# Patient Record
Sex: Male | Born: 1935 | Race: White | Hispanic: No | Marital: Married | Smoking: Never smoker
Health system: Southern US, Community
[De-identification: ages and names within clinical notes are randomized; demographics above are authoritative.]

## PROBLEM LIST (undated history)

## (undated) DIAGNOSIS — I452 Bifascicular block: Secondary | ICD-10-CM

## (undated) DIAGNOSIS — S329XXA Fracture of unspecified parts of lumbosacral spine and pelvis, initial encounter for closed fracture: Secondary | ICD-10-CM

## (undated) DIAGNOSIS — D1801 Hemangioma of skin and subcutaneous tissue: Secondary | ICD-10-CM

## (undated) DIAGNOSIS — D649 Anemia, unspecified: Secondary | ICD-10-CM

## (undated) DIAGNOSIS — C61 Malignant neoplasm of prostate: Secondary | ICD-10-CM

## (undated) DIAGNOSIS — I4891 Unspecified atrial fibrillation: Secondary | ICD-10-CM

## (undated) DIAGNOSIS — I781 Nevus, non-neoplastic: Secondary | ICD-10-CM

## (undated) DIAGNOSIS — I10 Essential (primary) hypertension: Secondary | ICD-10-CM

## (undated) DIAGNOSIS — Z9289 Personal history of other medical treatment: Secondary | ICD-10-CM

## (undated) DIAGNOSIS — C801 Malignant (primary) neoplasm, unspecified: Secondary | ICD-10-CM

## (undated) DIAGNOSIS — K219 Gastro-esophageal reflux disease without esophagitis: Secondary | ICD-10-CM

## (undated) DIAGNOSIS — R001 Bradycardia, unspecified: Secondary | ICD-10-CM

## (undated) DIAGNOSIS — I495 Sick sinus syndrome: Secondary | ICD-10-CM

## (undated) DIAGNOSIS — L659 Nonscarring hair loss, unspecified: Secondary | ICD-10-CM

## (undated) DIAGNOSIS — Q809 Congenital ichthyosis, unspecified: Secondary | ICD-10-CM

## (undated) DIAGNOSIS — H409 Unspecified glaucoma: Secondary | ICD-10-CM

## (undated) DIAGNOSIS — G459 Transient cerebral ischemic attack, unspecified: Secondary | ICD-10-CM

## (undated) HISTORY — PX: LITHOTRIPSY: SUR834

## (undated) HISTORY — DX: Malignant neoplasm of prostate: C61

## (undated) HISTORY — DX: Bifascicular block: I45.2

## (undated) HISTORY — DX: Nevus, non-neoplastic: I78.1

## (undated) HISTORY — DX: Sick sinus syndrome: I49.5

## (undated) HISTORY — DX: Anemia, unspecified: D64.9

## (undated) HISTORY — PX: APPENDECTOMY: SHX54

## (undated) HISTORY — DX: Essential (primary) hypertension: I10

## (undated) HISTORY — PX: CERVICAL SPINE SURGERY: SHX589

## (undated) HISTORY — DX: Bradycardia, unspecified: R00.1

## (undated) HISTORY — DX: Transient cerebral ischemic attack, unspecified: G45.9

## (undated) HISTORY — DX: Personal history of other medical treatment: Z92.89

## (undated) HISTORY — DX: Congenital ichthyosis, unspecified: Q80.9

## (undated) HISTORY — PX: PACEMAKER PLACEMENT: SHX43

## (undated) HISTORY — DX: Unspecified glaucoma: H40.9

## (undated) HISTORY — DX: Fracture of unspecified parts of lumbosacral spine and pelvis, initial encounter for closed fracture: S32.9XXA

## (undated) HISTORY — DX: Gastro-esophageal reflux disease without esophagitis: K21.9

## (undated) HISTORY — DX: Malignant (primary) neoplasm, unspecified: C80.1

## (undated) HISTORY — DX: Unspecified atrial fibrillation: I48.91

## (undated) HISTORY — PX: EXPLORATORY LAPAROTOMY: SUR591

## (undated) HISTORY — DX: Nonscarring hair loss, unspecified: L65.9

## (undated) HISTORY — DX: Hemangioma of skin and subcutaneous tissue: D18.01

---

## 2014-01-30 DIAGNOSIS — H4011X0 Primary open-angle glaucoma, stage unspecified: Secondary | ICD-10-CM | POA: Diagnosis not present

## 2014-02-02 DIAGNOSIS — R269 Unspecified abnormalities of gait and mobility: Secondary | ICD-10-CM | POA: Diagnosis not present

## 2014-02-10 DIAGNOSIS — R269 Unspecified abnormalities of gait and mobility: Secondary | ICD-10-CM | POA: Diagnosis not present

## 2014-02-10 DIAGNOSIS — M5137 Other intervertebral disc degeneration, lumbosacral region: Secondary | ICD-10-CM | POA: Diagnosis not present

## 2014-02-10 DIAGNOSIS — G959 Disease of spinal cord, unspecified: Secondary | ICD-10-CM | POA: Diagnosis not present

## 2014-02-10 DIAGNOSIS — M5126 Other intervertebral disc displacement, lumbar region: Secondary | ICD-10-CM | POA: Diagnosis not present

## 2014-02-10 DIAGNOSIS — M5030 Other cervical disc degeneration, unspecified cervical region: Secondary | ICD-10-CM | POA: Diagnosis not present

## 2014-02-10 DIAGNOSIS — Z981 Arthrodesis status: Secondary | ICD-10-CM | POA: Diagnosis not present

## 2014-02-10 DIAGNOSIS — M4000 Postural kyphosis, site unspecified: Secondary | ICD-10-CM | POA: Diagnosis not present

## 2014-02-10 DIAGNOSIS — M439 Deforming dorsopathy, unspecified: Secondary | ICD-10-CM | POA: Diagnosis not present

## 2014-02-24 DIAGNOSIS — I10 Essential (primary) hypertension: Secondary | ICD-10-CM | POA: Diagnosis not present

## 2014-02-24 DIAGNOSIS — R42 Dizziness and giddiness: Secondary | ICD-10-CM | POA: Diagnosis not present

## 2014-02-24 DIAGNOSIS — I4891 Unspecified atrial fibrillation: Secondary | ICD-10-CM | POA: Diagnosis not present

## 2014-02-24 DIAGNOSIS — I495 Sick sinus syndrome: Secondary | ICD-10-CM | POA: Diagnosis not present

## 2014-02-28 DIAGNOSIS — R209 Unspecified disturbances of skin sensation: Secondary | ICD-10-CM | POA: Diagnosis not present

## 2014-03-06 DIAGNOSIS — D649 Anemia, unspecified: Secondary | ICD-10-CM | POA: Diagnosis not present

## 2014-03-06 DIAGNOSIS — I10 Essential (primary) hypertension: Secondary | ICD-10-CM | POA: Diagnosis not present

## 2014-03-06 DIAGNOSIS — M1990 Unspecified osteoarthritis, unspecified site: Secondary | ICD-10-CM | POA: Diagnosis not present

## 2014-03-06 DIAGNOSIS — I4891 Unspecified atrial fibrillation: Secondary | ICD-10-CM | POA: Diagnosis not present

## 2014-03-14 DIAGNOSIS — Z950 Presence of cardiac pacemaker: Secondary | ICD-10-CM | POA: Diagnosis not present

## 2014-03-15 DIAGNOSIS — Z23 Encounter for immunization: Secondary | ICD-10-CM | POA: Diagnosis not present

## 2014-03-20 DIAGNOSIS — R262 Difficulty in walking, not elsewhere classified: Secondary | ICD-10-CM | POA: Diagnosis not present

## 2014-03-20 DIAGNOSIS — M6281 Muscle weakness (generalized): Secondary | ICD-10-CM | POA: Diagnosis not present

## 2014-03-21 DIAGNOSIS — R269 Unspecified abnormalities of gait and mobility: Secondary | ICD-10-CM | POA: Diagnosis not present

## 2014-03-21 DIAGNOSIS — M4802 Spinal stenosis, cervical region: Secondary | ICD-10-CM | POA: Diagnosis not present

## 2014-03-22 DIAGNOSIS — M6281 Muscle weakness (generalized): Secondary | ICD-10-CM | POA: Diagnosis not present

## 2014-03-22 DIAGNOSIS — R262 Difficulty in walking, not elsewhere classified: Secondary | ICD-10-CM | POA: Diagnosis not present

## 2014-03-27 DIAGNOSIS — M6281 Muscle weakness (generalized): Secondary | ICD-10-CM | POA: Diagnosis not present

## 2014-03-27 DIAGNOSIS — R262 Difficulty in walking, not elsewhere classified: Secondary | ICD-10-CM | POA: Diagnosis not present

## 2014-03-29 DIAGNOSIS — M6281 Muscle weakness (generalized): Secondary | ICD-10-CM | POA: Diagnosis not present

## 2014-03-29 DIAGNOSIS — R262 Difficulty in walking, not elsewhere classified: Secondary | ICD-10-CM | POA: Diagnosis not present

## 2014-03-30 DIAGNOSIS — S52599A Other fractures of lower end of unspecified radius, initial encounter for closed fracture: Secondary | ICD-10-CM | POA: Diagnosis not present

## 2014-04-05 DIAGNOSIS — S52599A Other fractures of lower end of unspecified radius, initial encounter for closed fracture: Secondary | ICD-10-CM | POA: Diagnosis not present

## 2014-04-19 DIAGNOSIS — S52599A Other fractures of lower end of unspecified radius, initial encounter for closed fracture: Secondary | ICD-10-CM | POA: Diagnosis not present

## 2014-04-26 DIAGNOSIS — M1710 Unilateral primary osteoarthritis, unspecified knee: Secondary | ICD-10-CM | POA: Diagnosis not present

## 2014-04-26 DIAGNOSIS — S52599A Other fractures of lower end of unspecified radius, initial encounter for closed fracture: Secondary | ICD-10-CM | POA: Diagnosis not present

## 2014-04-27 DIAGNOSIS — K5900 Constipation, unspecified: Secondary | ICD-10-CM | POA: Diagnosis not present

## 2014-04-28 DIAGNOSIS — I776 Arteritis, unspecified: Secondary | ICD-10-CM | POA: Diagnosis not present

## 2014-04-28 DIAGNOSIS — K8020 Calculus of gallbladder without cholecystitis without obstruction: Secondary | ICD-10-CM | POA: Diagnosis not present

## 2014-04-28 DIAGNOSIS — K5900 Constipation, unspecified: Secondary | ICD-10-CM | POA: Diagnosis not present

## 2014-05-02 DIAGNOSIS — C8299 Follicular lymphoma, unspecified, extranodal and solid organ sites: Secondary | ICD-10-CM | POA: Diagnosis not present

## 2014-05-02 DIAGNOSIS — K5900 Constipation, unspecified: Secondary | ICD-10-CM | POA: Diagnosis not present

## 2014-05-08 DIAGNOSIS — H4011X0 Primary open-angle glaucoma, stage unspecified: Secondary | ICD-10-CM | POA: Diagnosis not present

## 2014-05-08 DIAGNOSIS — H524 Presbyopia: Secondary | ICD-10-CM | POA: Diagnosis not present

## 2014-05-08 DIAGNOSIS — C8299 Follicular lymphoma, unspecified, extranodal and solid organ sites: Secondary | ICD-10-CM | POA: Diagnosis not present

## 2014-05-19 DIAGNOSIS — S52599A Other fractures of lower end of unspecified radius, initial encounter for closed fracture: Secondary | ICD-10-CM | POA: Diagnosis not present

## 2014-06-19 DIAGNOSIS — J069 Acute upper respiratory infection, unspecified: Secondary | ICD-10-CM | POA: Diagnosis not present

## 2014-06-23 DIAGNOSIS — S32810A Multiple fractures of pelvis with stable disruption of pelvic ring, initial encounter for closed fracture: Secondary | ICD-10-CM | POA: Diagnosis not present

## 2014-06-23 DIAGNOSIS — W19XXXA Unspecified fall, initial encounter: Secondary | ICD-10-CM | POA: Diagnosis not present

## 2014-06-23 DIAGNOSIS — M25559 Pain in unspecified hip: Secondary | ICD-10-CM | POA: Diagnosis not present

## 2014-06-23 DIAGNOSIS — S32509A Unspecified fracture of unspecified pubis, initial encounter for closed fracture: Secondary | ICD-10-CM | POA: Diagnosis not present

## 2014-06-24 DIAGNOSIS — I10 Essential (primary) hypertension: Secondary | ICD-10-CM | POA: Diagnosis present

## 2014-06-24 DIAGNOSIS — N400 Enlarged prostate without lower urinary tract symptoms: Secondary | ICD-10-CM | POA: Diagnosis not present

## 2014-06-24 DIAGNOSIS — Z950 Presence of cardiac pacemaker: Secondary | ICD-10-CM | POA: Diagnosis not present

## 2014-06-24 DIAGNOSIS — J810 Acute pulmonary edema: Secondary | ICD-10-CM | POA: Diagnosis not present

## 2014-06-24 DIAGNOSIS — K6389 Other specified diseases of intestine: Secondary | ICD-10-CM | POA: Diagnosis not present

## 2014-06-24 DIAGNOSIS — H47019 Ischemic optic neuropathy, unspecified eye: Secondary | ICD-10-CM | POA: Diagnosis not present

## 2014-06-24 DIAGNOSIS — J811 Chronic pulmonary edema: Secondary | ICD-10-CM | POA: Diagnosis not present

## 2014-06-24 DIAGNOSIS — Z01818 Encounter for other preprocedural examination: Secondary | ICD-10-CM | POA: Diagnosis not present

## 2014-06-24 DIAGNOSIS — W19XXXA Unspecified fall, initial encounter: Secondary | ICD-10-CM | POA: Diagnosis not present

## 2014-06-24 DIAGNOSIS — D649 Anemia, unspecified: Secondary | ICD-10-CM | POA: Diagnosis not present

## 2014-06-24 DIAGNOSIS — R143 Flatulence: Secondary | ICD-10-CM | POA: Diagnosis not present

## 2014-06-24 DIAGNOSIS — R141 Gas pain: Secondary | ICD-10-CM | POA: Diagnosis not present

## 2014-06-24 DIAGNOSIS — IMO0001 Reserved for inherently not codable concepts without codable children: Secondary | ICD-10-CM | POA: Diagnosis not present

## 2014-06-24 DIAGNOSIS — J449 Chronic obstructive pulmonary disease, unspecified: Secondary | ICD-10-CM | POA: Diagnosis present

## 2014-06-24 DIAGNOSIS — R0602 Shortness of breath: Secondary | ICD-10-CM | POA: Diagnosis not present

## 2014-06-24 DIAGNOSIS — I495 Sick sinus syndrome: Secondary | ICD-10-CM | POA: Diagnosis not present

## 2014-06-24 DIAGNOSIS — K5989 Other specified functional intestinal disorders: Secondary | ICD-10-CM | POA: Diagnosis not present

## 2014-06-24 DIAGNOSIS — Z4682 Encounter for fitting and adjustment of non-vascular catheter: Secondary | ICD-10-CM | POA: Diagnosis not present

## 2014-06-24 DIAGNOSIS — K560 Paralytic ileus: Secondary | ICD-10-CM | POA: Diagnosis not present

## 2014-06-24 DIAGNOSIS — E785 Hyperlipidemia, unspecified: Secondary | ICD-10-CM | POA: Diagnosis present

## 2014-06-24 DIAGNOSIS — R935 Abnormal findings on diagnostic imaging of other abdominal regions, including retroperitoneum: Secondary | ICD-10-CM | POA: Diagnosis not present

## 2014-06-24 DIAGNOSIS — K5900 Constipation, unspecified: Secondary | ICD-10-CM | POA: Diagnosis present

## 2014-06-24 DIAGNOSIS — J90 Pleural effusion, not elsewhere classified: Secondary | ICD-10-CM | POA: Diagnosis not present

## 2014-06-24 DIAGNOSIS — Z452 Encounter for adjustment and management of vascular access device: Secondary | ICD-10-CM | POA: Diagnosis not present

## 2014-06-24 DIAGNOSIS — R112 Nausea with vomiting, unspecified: Secondary | ICD-10-CM | POA: Diagnosis not present

## 2014-06-24 DIAGNOSIS — Z5189 Encounter for other specified aftercare: Secondary | ICD-10-CM | POA: Diagnosis not present

## 2014-06-24 DIAGNOSIS — I509 Heart failure, unspecified: Secondary | ICD-10-CM | POA: Diagnosis present

## 2014-06-24 DIAGNOSIS — K219 Gastro-esophageal reflux disease without esophagitis: Secondary | ICD-10-CM | POA: Diagnosis not present

## 2014-06-24 DIAGNOSIS — I2510 Atherosclerotic heart disease of native coronary artery without angina pectoris: Secondary | ICD-10-CM | POA: Diagnosis present

## 2014-06-24 DIAGNOSIS — R0902 Hypoxemia: Secondary | ICD-10-CM | POA: Diagnosis not present

## 2014-06-24 DIAGNOSIS — N179 Acute kidney failure, unspecified: Secondary | ICD-10-CM | POA: Diagnosis not present

## 2014-06-24 DIAGNOSIS — S3289XA Fracture of other parts of pelvis, initial encounter for closed fracture: Secondary | ICD-10-CM | POA: Diagnosis not present

## 2014-06-24 DIAGNOSIS — Z87898 Personal history of other specified conditions: Secondary | ICD-10-CM | POA: Diagnosis not present

## 2014-06-24 DIAGNOSIS — M1990 Unspecified osteoarthritis, unspecified site: Secondary | ICD-10-CM | POA: Diagnosis not present

## 2014-06-24 DIAGNOSIS — K5669 Other intestinal obstruction: Secondary | ICD-10-CM | POA: Diagnosis not present

## 2014-06-24 DIAGNOSIS — S32509A Unspecified fracture of unspecified pubis, initial encounter for closed fracture: Secondary | ICD-10-CM | POA: Diagnosis not present

## 2014-06-24 DIAGNOSIS — R109 Unspecified abdominal pain: Secondary | ICD-10-CM | POA: Diagnosis not present

## 2014-06-24 DIAGNOSIS — R52 Pain, unspecified: Secondary | ICD-10-CM | POA: Diagnosis not present

## 2014-06-24 DIAGNOSIS — S329XXA Fracture of unspecified parts of lumbosacral spine and pelvis, initial encounter for closed fracture: Secondary | ICD-10-CM | POA: Diagnosis not present

## 2014-07-04 DIAGNOSIS — R0989 Other specified symptoms and signs involving the circulatory and respiratory systems: Secondary | ICD-10-CM | POA: Diagnosis not present

## 2014-07-04 DIAGNOSIS — S32509A Unspecified fracture of unspecified pubis, initial encounter for closed fracture: Secondary | ICD-10-CM | POA: Diagnosis not present

## 2014-07-04 DIAGNOSIS — H47019 Ischemic optic neuropathy, unspecified eye: Secondary | ICD-10-CM | POA: Diagnosis present

## 2014-07-04 DIAGNOSIS — G609 Hereditary and idiopathic neuropathy, unspecified: Secondary | ICD-10-CM | POA: Diagnosis present

## 2014-07-04 DIAGNOSIS — Z713 Dietary counseling and surveillance: Secondary | ICD-10-CM | POA: Diagnosis not present

## 2014-07-04 DIAGNOSIS — I10 Essential (primary) hypertension: Secondary | ICD-10-CM | POA: Diagnosis present

## 2014-07-04 DIAGNOSIS — J90 Pleural effusion, not elsewhere classified: Secondary | ICD-10-CM | POA: Diagnosis not present

## 2014-07-04 DIAGNOSIS — Z9181 History of falling: Secondary | ICD-10-CM | POA: Diagnosis not present

## 2014-07-04 DIAGNOSIS — I495 Sick sinus syndrome: Secondary | ICD-10-CM | POA: Diagnosis present

## 2014-07-04 DIAGNOSIS — Z5189 Encounter for other specified aftercare: Secondary | ICD-10-CM | POA: Diagnosis not present

## 2014-07-04 DIAGNOSIS — Z87898 Personal history of other specified conditions: Secondary | ICD-10-CM | POA: Diagnosis not present

## 2014-07-04 DIAGNOSIS — M1990 Unspecified osteoarthritis, unspecified site: Secondary | ICD-10-CM | POA: Diagnosis present

## 2014-07-04 DIAGNOSIS — K5669 Other intestinal obstruction: Secondary | ICD-10-CM | POA: Diagnosis not present

## 2014-07-04 DIAGNOSIS — K219 Gastro-esophageal reflux disease without esophagitis: Secondary | ICD-10-CM | POA: Diagnosis present

## 2014-07-04 DIAGNOSIS — Z7982 Long term (current) use of aspirin: Secondary | ICD-10-CM | POA: Diagnosis not present

## 2014-07-04 DIAGNOSIS — IMO0001 Reserved for inherently not codable concepts without codable children: Secondary | ICD-10-CM | POA: Diagnosis not present

## 2014-07-04 DIAGNOSIS — N400 Enlarged prostate without lower urinary tract symptoms: Secondary | ICD-10-CM | POA: Diagnosis present

## 2014-07-04 DIAGNOSIS — Z950 Presence of cardiac pacemaker: Secondary | ICD-10-CM | POA: Diagnosis not present

## 2014-07-04 DIAGNOSIS — R52 Pain, unspecified: Secondary | ICD-10-CM | POA: Diagnosis not present

## 2014-07-04 DIAGNOSIS — H9190 Unspecified hearing loss, unspecified ear: Secondary | ICD-10-CM | POA: Diagnosis present

## 2014-07-04 DIAGNOSIS — IMO0002 Reserved for concepts with insufficient information to code with codable children: Secondary | ICD-10-CM | POA: Diagnosis present

## 2014-07-04 DIAGNOSIS — D649 Anemia, unspecified: Secondary | ICD-10-CM | POA: Diagnosis not present

## 2014-07-25 DIAGNOSIS — D649 Anemia, unspecified: Secondary | ICD-10-CM | POA: Diagnosis not present

## 2014-07-25 DIAGNOSIS — Z79899 Other long term (current) drug therapy: Secondary | ICD-10-CM | POA: Diagnosis not present

## 2014-07-27 DIAGNOSIS — R269 Unspecified abnormalities of gait and mobility: Secondary | ICD-10-CM | POA: Diagnosis not present

## 2014-07-27 DIAGNOSIS — IMO0001 Reserved for inherently not codable concepts without codable children: Secondary | ICD-10-CM | POA: Diagnosis not present

## 2014-07-31 DIAGNOSIS — R269 Unspecified abnormalities of gait and mobility: Secondary | ICD-10-CM | POA: Diagnosis not present

## 2014-07-31 DIAGNOSIS — Z1283 Encounter for screening for malignant neoplasm of skin: Secondary | ICD-10-CM | POA: Diagnosis not present

## 2014-07-31 DIAGNOSIS — L851 Acquired keratosis [keratoderma] palmaris et plantaris: Secondary | ICD-10-CM | POA: Diagnosis not present

## 2014-07-31 DIAGNOSIS — D692 Other nonthrombocytopenic purpura: Secondary | ICD-10-CM | POA: Diagnosis not present

## 2014-07-31 DIAGNOSIS — L570 Actinic keratosis: Secondary | ICD-10-CM | POA: Diagnosis not present

## 2014-07-31 DIAGNOSIS — IMO0001 Reserved for inherently not codable concepts without codable children: Secondary | ICD-10-CM | POA: Diagnosis not present

## 2014-08-02 DIAGNOSIS — K5989 Other specified functional intestinal disorders: Secondary | ICD-10-CM | POA: Diagnosis not present

## 2014-08-02 DIAGNOSIS — R269 Unspecified abnormalities of gait and mobility: Secondary | ICD-10-CM | POA: Diagnosis not present

## 2014-08-02 DIAGNOSIS — IMO0001 Reserved for inherently not codable concepts without codable children: Secondary | ICD-10-CM | POA: Diagnosis not present

## 2014-08-02 DIAGNOSIS — S32810A Multiple fractures of pelvis with stable disruption of pelvic ring, initial encounter for closed fracture: Secondary | ICD-10-CM | POA: Diagnosis not present

## 2014-08-02 DIAGNOSIS — R69 Illness, unspecified: Secondary | ICD-10-CM | POA: Diagnosis not present

## 2014-08-02 DIAGNOSIS — IMO0002 Reserved for concepts with insufficient information to code with codable children: Secondary | ICD-10-CM | POA: Diagnosis not present

## 2014-08-07 DIAGNOSIS — R269 Unspecified abnormalities of gait and mobility: Secondary | ICD-10-CM | POA: Diagnosis not present

## 2014-08-07 DIAGNOSIS — IMO0001 Reserved for inherently not codable concepts without codable children: Secondary | ICD-10-CM | POA: Diagnosis not present

## 2014-08-11 DIAGNOSIS — IMO0001 Reserved for inherently not codable concepts without codable children: Secondary | ICD-10-CM | POA: Diagnosis not present

## 2014-08-11 DIAGNOSIS — R269 Unspecified abnormalities of gait and mobility: Secondary | ICD-10-CM | POA: Diagnosis not present

## 2014-08-14 DIAGNOSIS — R269 Unspecified abnormalities of gait and mobility: Secondary | ICD-10-CM | POA: Diagnosis not present

## 2014-08-14 DIAGNOSIS — IMO0001 Reserved for inherently not codable concepts without codable children: Secondary | ICD-10-CM | POA: Diagnosis not present

## 2014-08-16 DIAGNOSIS — R269 Unspecified abnormalities of gait and mobility: Secondary | ICD-10-CM | POA: Diagnosis not present

## 2014-08-16 DIAGNOSIS — IMO0001 Reserved for inherently not codable concepts without codable children: Secondary | ICD-10-CM | POA: Diagnosis not present

## 2014-08-18 DIAGNOSIS — K5900 Constipation, unspecified: Secondary | ICD-10-CM | POA: Diagnosis not present

## 2014-08-18 DIAGNOSIS — Z9181 History of falling: Secondary | ICD-10-CM | POA: Diagnosis not present

## 2014-08-18 DIAGNOSIS — D649 Anemia, unspecified: Secondary | ICD-10-CM | POA: Diagnosis not present

## 2014-08-18 DIAGNOSIS — S32509A Unspecified fracture of unspecified pubis, initial encounter for closed fracture: Secondary | ICD-10-CM | POA: Diagnosis not present

## 2014-08-21 DIAGNOSIS — IMO0001 Reserved for inherently not codable concepts without codable children: Secondary | ICD-10-CM | POA: Diagnosis not present

## 2014-08-21 DIAGNOSIS — R269 Unspecified abnormalities of gait and mobility: Secondary | ICD-10-CM | POA: Diagnosis not present

## 2014-08-23 DIAGNOSIS — H4011X0 Primary open-angle glaucoma, stage unspecified: Secondary | ICD-10-CM | POA: Diagnosis not present

## 2014-08-28 DIAGNOSIS — R269 Unspecified abnormalities of gait and mobility: Secondary | ICD-10-CM | POA: Diagnosis not present

## 2014-08-28 DIAGNOSIS — IMO0001 Reserved for inherently not codable concepts without codable children: Secondary | ICD-10-CM | POA: Diagnosis not present

## 2014-08-31 DIAGNOSIS — R269 Unspecified abnormalities of gait and mobility: Secondary | ICD-10-CM | POA: Diagnosis not present

## 2014-08-31 DIAGNOSIS — IMO0001 Reserved for inherently not codable concepts without codable children: Secondary | ICD-10-CM | POA: Diagnosis not present

## 2014-09-05 DIAGNOSIS — R269 Unspecified abnormalities of gait and mobility: Secondary | ICD-10-CM | POA: Diagnosis not present

## 2014-09-05 DIAGNOSIS — IMO0001 Reserved for inherently not codable concepts without codable children: Secondary | ICD-10-CM | POA: Diagnosis not present

## 2014-09-07 DIAGNOSIS — R269 Unspecified abnormalities of gait and mobility: Secondary | ICD-10-CM | POA: Diagnosis not present

## 2014-09-07 DIAGNOSIS — IMO0001 Reserved for inherently not codable concepts without codable children: Secondary | ICD-10-CM | POA: Diagnosis not present

## 2014-09-08 DIAGNOSIS — Z79899 Other long term (current) drug therapy: Secondary | ICD-10-CM | POA: Diagnosis not present

## 2014-09-08 DIAGNOSIS — I4891 Unspecified atrial fibrillation: Secondary | ICD-10-CM | POA: Diagnosis not present

## 2014-09-08 DIAGNOSIS — I495 Sick sinus syndrome: Secondary | ICD-10-CM | POA: Diagnosis not present

## 2014-09-08 DIAGNOSIS — R059 Cough, unspecified: Secondary | ICD-10-CM | POA: Diagnosis not present

## 2014-09-08 DIAGNOSIS — R05 Cough: Secondary | ICD-10-CM | POA: Diagnosis not present

## 2014-09-11 DIAGNOSIS — IMO0001 Reserved for inherently not codable concepts without codable children: Secondary | ICD-10-CM | POA: Diagnosis not present

## 2014-09-11 DIAGNOSIS — R269 Unspecified abnormalities of gait and mobility: Secondary | ICD-10-CM | POA: Diagnosis not present

## 2014-09-12 DIAGNOSIS — Z79899 Other long term (current) drug therapy: Secondary | ICD-10-CM | POA: Diagnosis not present

## 2014-09-15 DIAGNOSIS — IMO0001 Reserved for inherently not codable concepts without codable children: Secondary | ICD-10-CM | POA: Diagnosis not present

## 2014-09-15 DIAGNOSIS — R269 Unspecified abnormalities of gait and mobility: Secondary | ICD-10-CM | POA: Diagnosis not present

## 2014-09-18 DIAGNOSIS — R269 Unspecified abnormalities of gait and mobility: Secondary | ICD-10-CM | POA: Diagnosis not present

## 2014-09-18 DIAGNOSIS — IMO0001 Reserved for inherently not codable concepts without codable children: Secondary | ICD-10-CM | POA: Diagnosis not present

## 2014-09-20 DIAGNOSIS — R269 Unspecified abnormalities of gait and mobility: Secondary | ICD-10-CM | POA: Diagnosis not present

## 2014-09-20 DIAGNOSIS — IMO0001 Reserved for inherently not codable concepts without codable children: Secondary | ICD-10-CM | POA: Diagnosis not present

## 2014-09-20 DIAGNOSIS — Z23 Encounter for immunization: Secondary | ICD-10-CM | POA: Diagnosis not present

## 2014-09-26 DIAGNOSIS — Z950 Presence of cardiac pacemaker: Secondary | ICD-10-CM | POA: Diagnosis not present

## 2014-09-28 DIAGNOSIS — I495 Sick sinus syndrome: Secondary | ICD-10-CM | POA: Diagnosis not present

## 2014-09-28 DIAGNOSIS — I4891 Unspecified atrial fibrillation: Secondary | ICD-10-CM | POA: Diagnosis not present

## 2014-09-28 DIAGNOSIS — Z79899 Other long term (current) drug therapy: Secondary | ICD-10-CM | POA: Diagnosis not present

## 2014-09-28 DIAGNOSIS — R05 Cough: Secondary | ICD-10-CM | POA: Diagnosis not present

## 2014-10-02 DIAGNOSIS — M810 Age-related osteoporosis without current pathological fracture: Secondary | ICD-10-CM | POA: Diagnosis not present

## 2014-10-02 DIAGNOSIS — I480 Paroxysmal atrial fibrillation: Secondary | ICD-10-CM | POA: Diagnosis not present

## 2014-10-02 DIAGNOSIS — Z79899 Other long term (current) drug therapy: Secondary | ICD-10-CM | POA: Diagnosis not present

## 2014-10-16 DIAGNOSIS — R05 Cough: Secondary | ICD-10-CM | POA: Diagnosis not present

## 2014-10-23 DIAGNOSIS — M810 Age-related osteoporosis without current pathological fracture: Secondary | ICD-10-CM | POA: Diagnosis not present

## 2014-11-21 DIAGNOSIS — H4011X2 Primary open-angle glaucoma, moderate stage: Secondary | ICD-10-CM | POA: Diagnosis not present

## 2014-11-30 DIAGNOSIS — M25562 Pain in left knee: Secondary | ICD-10-CM | POA: Diagnosis not present

## 2014-11-30 DIAGNOSIS — R05 Cough: Secondary | ICD-10-CM | POA: Diagnosis not present

## 2014-11-30 DIAGNOSIS — M1990 Unspecified osteoarthritis, unspecified site: Secondary | ICD-10-CM | POA: Diagnosis not present

## 2014-12-07 DIAGNOSIS — L821 Other seborrheic keratosis: Secondary | ICD-10-CM | POA: Diagnosis not present

## 2014-12-07 DIAGNOSIS — D485 Neoplasm of uncertain behavior of skin: Secondary | ICD-10-CM | POA: Diagnosis not present

## 2014-12-07 DIAGNOSIS — Z85828 Personal history of other malignant neoplasm of skin: Secondary | ICD-10-CM | POA: Diagnosis not present

## 2014-12-07 DIAGNOSIS — L723 Sebaceous cyst: Secondary | ICD-10-CM | POA: Diagnosis not present

## 2014-12-08 DIAGNOSIS — R300 Dysuria: Secondary | ICD-10-CM | POA: Diagnosis not present

## 2014-12-08 DIAGNOSIS — R8290 Unspecified abnormal findings in urine: Secondary | ICD-10-CM | POA: Diagnosis not present

## 2015-01-29 DIAGNOSIS — Z79899 Other long term (current) drug therapy: Secondary | ICD-10-CM | POA: Diagnosis not present

## 2015-01-29 DIAGNOSIS — M810 Age-related osteoporosis without current pathological fracture: Secondary | ICD-10-CM | POA: Diagnosis not present

## 2015-01-29 DIAGNOSIS — D649 Anemia, unspecified: Secondary | ICD-10-CM | POA: Diagnosis not present

## 2015-01-29 DIAGNOSIS — M549 Dorsalgia, unspecified: Secondary | ICD-10-CM | POA: Diagnosis not present

## 2015-01-29 DIAGNOSIS — R21 Rash and other nonspecific skin eruption: Secondary | ICD-10-CM | POA: Diagnosis not present

## 2015-02-16 DIAGNOSIS — H0289 Other specified disorders of eyelid: Secondary | ICD-10-CM | POA: Diagnosis not present

## 2015-02-23 DIAGNOSIS — R300 Dysuria: Secondary | ICD-10-CM | POA: Diagnosis not present

## 2015-02-23 DIAGNOSIS — R8290 Unspecified abnormal findings in urine: Secondary | ICD-10-CM | POA: Diagnosis not present

## 2015-02-27 DIAGNOSIS — C8590 Non-Hodgkin lymphoma, unspecified, unspecified site: Secondary | ICD-10-CM | POA: Diagnosis not present

## 2015-02-27 DIAGNOSIS — Z950 Presence of cardiac pacemaker: Secondary | ICD-10-CM | POA: Diagnosis not present

## 2015-02-27 DIAGNOSIS — H409 Unspecified glaucoma: Secondary | ICD-10-CM | POA: Diagnosis not present

## 2015-02-27 DIAGNOSIS — S72009A Fracture of unspecified part of neck of unspecified femur, initial encounter for closed fracture: Secondary | ICD-10-CM | POA: Diagnosis not present

## 2015-03-01 ENCOUNTER — Emergency Department (HOSPITAL_COMMUNITY): Payer: Medicare Other

## 2015-03-01 ENCOUNTER — Emergency Department (HOSPITAL_COMMUNITY)
Admission: EM | Admit: 2015-03-01 | Discharge: 2015-03-01 | Disposition: A | Discharge status: Home / Self Care | Payer: Medicare Other | Attending: Emergency Medicine | Admitting: Emergency Medicine

## 2015-03-01 DIAGNOSIS — Z8781 Personal history of (healed) traumatic fracture: Secondary | ICD-10-CM | POA: Diagnosis not present

## 2015-03-01 DIAGNOSIS — Z87828 Personal history of other (healed) physical injury and trauma: Secondary | ICD-10-CM | POA: Diagnosis not present

## 2015-03-01 DIAGNOSIS — M1611 Unilateral primary osteoarthritis, right hip: Secondary | ICD-10-CM | POA: Diagnosis not present

## 2015-03-01 DIAGNOSIS — M25551 Pain in right hip: Secondary | ICD-10-CM

## 2015-03-01 MED ORDER — MORPHINE SULFATE 4 MG/ML IJ SOLN
4.0000 mg | Freq: Once | INTRAMUSCULAR | Status: AC
  Administered 2015-03-01: 4 mg via INTRAMUSCULAR
  Filled 2015-03-01: qty 1

## 2015-03-01 MED ORDER — OXYCODONE-ACETAMINOPHEN 5-325 MG PO TABS: 1.0000 | ORAL_TABLET | Freq: Four times a day (QID) | ORAL | Status: DC | PRN

## 2015-03-01 MED ORDER — DOCUSATE SODIUM 100 MG PO CAPS: 100.0000 mg | ORAL_CAPSULE | Freq: Two times a day (BID) | ORAL | Status: DC

## 2015-03-01 MED ORDER — HYDROCODONE-ACETAMINOPHEN 5-325 MG PO TABS
2.0000 | ORAL_TABLET | Freq: Once | ORAL | Status: AC
  Administered 2015-03-01: 2 via ORAL
  Filled 2015-03-01: qty 2

## 2015-03-01 NOTE — ED Notes (Signed)
Per EMS 3 days prior began having rt hip pain. Denies any recent falls/injuries. Hx of broken bones in pelvis in June 2015. EMS states no deformities, able to walk with cane.   Pt took 1000mg  Tylenol around 0900 this a.m.

## 2015-03-01 NOTE — ED Notes (Signed)
Bed: WA07 Expected date:  Expected time:  Means of arrival:  Comments: Ems- 81 yo M, right hip pain

## 2015-03-01 NOTE — Discharge Instructions (Signed)
Hip Pain Your hip is the joint between your upper legs and your lower pelvis. The bones, cartilage, tendons, and muscles of your hip joint perform a lot of work each day supporting your body weight and allowing you to move around. Hip pain can range from a minor ache to severe pain in one or both of your hips. Pain may be felt on the inside of the hip joint near the groin, or the outside near the buttocks and upper thigh. You may have swelling or stiffness as well.  HOME CARE INSTRUCTIONS   Take medicines only as directed by your health care provider.  Apply ice to the injured area:  Put ice in a plastic bag.  Place a towel between your skin and the bag.  Leave the ice on for 15-20 minutes at a time, 3-4 times a day.  Keep your leg raised (elevated) when possible to lessen swelling.  Avoid activities that cause pain.  Follow specific exercises as directed by your health care provider.  Sleep with a pillow between your legs on your most comfortable side.  Record how often you have hip pain, the location of the pain, and what it feels like. SEEK MEDICAL CARE IF:   You are unable to put weight on your leg.  Your hip is red or swollen or very tender to touch.  Your pain or swelling continues or worsens after 1 week.  You have increasing difficulty walking.  You have a fever. SEEK IMMEDIATE MEDICAL CARE IF:   You have fallen.  You have a sudden increase in pain and swelling in your hip. MAKE SURE YOU:   Understand these instructions.  Will watch your condition.  Will get help right away if you are not doing well or get worse. Document Released: 06/04/2010 Document Revised: 05/01/2014 Document Reviewed: 08/11/2013 [facility] Patient Information 2015 [place], [place]. This information is not intended to replace advice given to you by your health care provider. Make sure you discuss any questions you have with your health care provider.   Arthritis, Nonspecific Arthritis is  inflammation of a joint. This usually means pain, redness, warmth or swelling are present. One or more joints may be involved. There are a number of types of arthritis. Your caregiver may not be able to tell what type of arthritis you have right away. CAUSES  The most common cause of arthritis is the wear and tear on the joint (osteoarthritis). This causes damage to the cartilage, which can break down over time. The knees, hips, back and neck are most often affected by this type of arthritis. Other types of arthritis and common causes of joint pain include:  Sprains and other injuries near the joint. Sometimes minor sprains and injuries cause pain and swelling that develop hours later.  Rheumatoid arthritis. This affects hands, feet and knees. It usually affects both sides of your body at the same time. It is often associated with chronic ailments, fever, weight loss and general weakness.  Crystal arthritis. Gout and pseudo gout can cause occasional acute severe pain, redness and swelling in the foot, ankle, or knee.  Infectious arthritis. Bacteria can get into a joint through a break in overlying skin. This can cause infection of the joint. Bacteria and viruses can also spread through the blood and affect your joints.  Drug, infectious and allergy reactions. Sometimes joints can become mildly painful and slightly swollen with these types of illnesses. SYMPTOMS   Pain is the main symptom.  Your joint or  joints can also be red, swollen and warm or hot to the touch.  You may have a fever with certain types of arthritis, or even feel overall ill.  The joint with arthritis will hurt with movement. Stiffness is present with some types of arthritis. DIAGNOSIS  Your caregiver will suspect arthritis based on your description of your symptoms and on your exam. Testing may be needed to find the type of arthritis:  Blood and sometimes urine tests.  X-ray tests and sometimes CT or MRI  scans.  Removal of fluid from the joint (arthrocentesis) is done to check for bacteria, crystals or other causes. Your caregiver (or a specialist) will numb the area over the joint with a local anesthetic, and use a needle to remove joint fluid for examination. This procedure is only minimally uncomfortable.  Even with these tests, your caregiver may not be able to tell what kind of arthritis you have. Consultation with a specialist (rheumatologist) may be helpful. TREATMENT  Your caregiver will discuss with you treatment specific to your type of arthritis. If the specific type cannot be determined, then the following general recommendations may apply. Treatment of severe joint pain includes:  Rest.  Elevation.  Anti-inflammatory medication (for example, ibuprofen) may be prescribed. Avoiding activities that cause increased pain.  Only take over-the-counter or prescription medicines for pain and discomfort as recommended by your caregiver.  Cold packs over an inflamed joint may be used for 10 to 15 minutes every hour. Hot packs sometimes feel better, but do not use overnight. Do not use hot packs if you are diabetic without your caregiver's permission.  A cortisone shot into arthritic joints may help reduce pain and swelling.  Any acute arthritis that gets worse over the next 1 to 2 days needs to be looked at to be sure there is no joint infection. Long-term arthritis treatment involves modifying activities and lifestyle to reduce joint stress jarring. This can include weight loss. Also, exercise is needed to nourish the joint cartilage and remove waste. This helps keep the muscles around the joint strong. HOME CARE INSTRUCTIONS   Do not take aspirin to relieve pain if gout is suspected. This elevates uric acid levels.  Only take over-the-counter or prescription medicines for pain, discomfort or fever as directed by your caregiver.  Rest the joint as much as possible.  If your joint is  swollen, keep it elevated.  Use crutches if the painful joint is in your leg.  Drinking plenty of fluids may help for certain types of arthritis.  Follow your caregiver's dietary instructions.  Try low-impact exercise such as:  Swimming.  Water aerobics.  Biking.  Walking.  Morning stiffness is often relieved by a warm shower.  Put your joints through regular range-of-motion. SEEK MEDICAL CARE IF:   You do not feel better in 24 hours or are getting worse.  You have side effects to medications, or are not getting better with treatment. SEEK IMMEDIATE MEDICAL CARE IF:   You have a fever.  You develop severe joint pain, swelling or redness.  Many joints are involved and become painful and swollen.  There is severe back pain and/or leg weakness.  You have loss of bowel or bladder control. Document Released: 01/22/2005 Document Revised: 03/08/2012 Document Reviewed: 02/07/2009 [facility] Patient Information 2015 [place], [place]. This information is not intended to replace advice given to you by your health care provider. Make sure you discuss any questions you have with your health care provider.

## 2015-03-01 NOTE — ED Provider Notes (Signed)
TIME SEEN: 12:30 PM  CHIEF COMPLAINT: Right hip pain  HPI: Pt is a 81 y.o. male with prior history of pelvic fractures who presents to the emergency department with complaints of right hip pain over the past several days. States he fell in June 2015 and had multiple hip fractures. States that he has not had any new injury but his pain has come back in the right hip and is worse with movement. He took Tylenol this morning with some relief. No numbness, tingling or focal weakness. No joint swelling. No fever. No redness or warmth. No numbness, tingling or focal weakness.  ROS: See HPI Constitutional: no fever  Eyes: no drainage  ENT: no runny nose   Cardiovascular:  no chest pain  Resp: no SOB  GI: no vomiting GU: no dysuria Integumentary: no rash  Allergy: no hives  Musculoskeletal: no leg swelling  Neurological: no slurred speech ROS otherwise negative  PAST MEDICAL HISTORY/PAST SURGICAL HISTORY:  No past medical history on file.  MEDICATIONS:  Prior to Admission medications   Not on File    ALLERGIES:  Allergies not on file  SOCIAL HISTORY:  History  Substance Use Topics  . Smoking status: Not on file  . Smokeless tobacco: Not on file  . Alcohol Use: Not on file    FAMILY HISTORY: No family history on file.  EXAM: BP 136/60 mmHg  Pulse 70  Temp(Src) 97.8 F (36.6 C) (Oral)  Resp 18  SpO2 98% CONSTITUTIONAL: Alert and oriented and responds appropriately to questions. Well-appearing; well-nourished, elderly, in no distress HEAD: Normocephalic EYES: Conjunctivae clear, PERRL ENT: normal nose; no rhinorrhea; moist mucous membranes; pharynx without lesions noted NECK: Supple, no meningismus, no LAD  CARD: RRR; S1 and S2 appreciated; no murmurs, no clicks, no rubs, no gallops RESP: Normal chest excursion without splinting or tachypnea; breath sounds clear and equal bilaterally; no wheezes, no rhonchi, no rales,  ABD/GI: Normal bowel sounds; non-distended; soft,  non-tender, no rebound, no guarding BACK:  The back appears normal and is non-tender to palpation, there is no CVA tenderness EXT: Tender to palpation of the right hip without erythema or warmth, no joint effusion, no leg length discrepancy, 2+ DP pulses bilaterally, no calf tenderness or swelling, pain with flexion of the right hip, otherwise Normal ROM in all joints; otherwise Ixodes are non-tender to palpation; no edema; normal capillary refill; no cyanosis; compartments soft    SKIN: Normal color for age and race; warm NEURO: Moves all extremities equally, sensation to light touch intact diffusely PSYCH: The patient's mood and manner are appropriate. Grooming and personal hygiene are appropriate.  MEDICAL DECISION MAKING: Patient here with right hip pain. Suspect exacerbation of his chronic injury. No history of new injury, fall. No sign of septic arthritis. Neurovascular intact distally. Will obtain x-ray of the right hip. He has been able to ambulate with a cane for the past several days. We'll give Vicodin for pain.  ED PROGRESS: X-ray show no acute fracture or subluxation. Do not feel he needs a CT scan at this time. He has been able to ambulate with a cane. Suspect this is just an exacerbation of his chronic pain from his prior hip fractures. Wife does report they recently moved and he now has 6 steps that he has to walk up daily. She thinks this may be contributing. Have advised him to rest, elevate, apply ice. We'll discharge with Percocet to use as needed. Have advised him to follow up with his primary  care physician if symptoms continue. Discussed return precautions. Patient verbalizes understanding and is comfortable with plan.     [place], DO 03/01/15 1334

## 2015-03-02 DIAGNOSIS — H04121 Dry eye syndrome of right lacrimal gland: Secondary | ICD-10-CM | POA: Diagnosis not present

## 2015-03-08 DIAGNOSIS — M25551 Pain in right hip: Secondary | ICD-10-CM | POA: Diagnosis not present

## 2015-03-12 DIAGNOSIS — M25551 Pain in right hip: Secondary | ICD-10-CM | POA: Diagnosis not present

## 2015-03-14 ENCOUNTER — Telehealth: Payer: Self-pay | Admitting: Hematology

## 2015-03-14 NOTE — Telephone Encounter (Signed)
CALLED, SCHEDULED, AND CONFIRMED APPT WITH PATIENT.  DR. Burr Medico 04/09/15 2:30 PM  DX: LYMPHOMA IN REMISSION REFERRING: [name]

## 2015-03-16 DIAGNOSIS — H04123 Dry eye syndrome of bilateral lacrimal glands: Secondary | ICD-10-CM | POA: Diagnosis not present

## 2015-03-20 DIAGNOSIS — M25551 Pain in right hip: Secondary | ICD-10-CM | POA: Diagnosis not present

## 2015-03-30 ENCOUNTER — Other Ambulatory Visit: Payer: Self-pay | Admitting: Medical Oncology

## 2015-03-30 DIAGNOSIS — Z8572 Personal history of non-Hodgkin lymphomas: Secondary | ICD-10-CM

## 2015-04-02 ENCOUNTER — Ambulatory Visit (HOSPITAL_BASED_OUTPATIENT_CLINIC_OR_DEPARTMENT_OTHER): Payer: Medicare Other | Admitting: Internal Medicine

## 2015-04-02 ENCOUNTER — Ambulatory Visit: Payer: Medicare Other

## 2015-04-02 ENCOUNTER — Other Ambulatory Visit (HOSPITAL_BASED_OUTPATIENT_CLINIC_OR_DEPARTMENT_OTHER): Payer: Medicare Other

## 2015-04-02 ENCOUNTER — Other Ambulatory Visit: Payer: Self-pay | Admitting: Internal Medicine

## 2015-04-02 VITALS — BP 136/64 | HR 80 | Temp 98.9°F | Resp 18 | Ht 72.0 in | Wt 212.6 lb

## 2015-04-02 DIAGNOSIS — Z8572 Personal history of non-Hodgkin lymphomas: Secondary | ICD-10-CM

## 2015-04-02 DIAGNOSIS — C8290 Follicular lymphoma, unspecified, unspecified site: Secondary | ICD-10-CM

## 2015-04-02 LAB — CBC WITH DIFFERENTIAL/PLATELET
BASO%: 1 % (ref 0.0–2.0)
Basophils Absolute: 0 10*3/uL (ref 0.0–0.1)
EOS%: 2.4 % (ref 0.0–7.0)
Eosinophils Absolute: 0.1 10*3/uL (ref 0.0–0.5)
HCT: 35.1 % — ABNORMAL LOW (ref 38.4–49.9)
HGB: 11.6 g/dL — ABNORMAL LOW (ref 13.0–17.1)
LYMPH%: 17.6 % (ref 14.0–49.0)
MCH: 32.1 pg (ref 27.2–33.4)
MCHC: 33 g/dL (ref 32.0–36.0)
MCV: 97.4 fL (ref 79.3–98.0)
MONO#: 0.3 10*3/uL (ref 0.1–0.9)
MONO%: 6.7 % (ref 0.0–14.0)
NEUT#: 3.5 10*3/uL (ref 1.5–6.5)
NEUT%: 72.3 % (ref 39.0–75.0)
Platelets: 151 10*3/uL (ref 140–400)
RBC: 3.61 10*6/uL — ABNORMAL LOW (ref 4.20–5.82)
RDW: 13.5 % (ref 11.0–14.6)
WBC: 4.8 10*3/uL (ref 4.0–10.3)
lymph#: 0.8 10*3/uL — ABNORMAL LOW (ref 0.9–3.3)

## 2015-04-02 LAB — COMPREHENSIVE METABOLIC PANEL (CC13)
ALT: 31 U/L (ref 0–55)
AST: 23 U/L (ref 5–34)
Albumin: 3.8 g/dL (ref 3.5–5.0)
Alkaline Phosphatase: 40 U/L (ref 40–150)
Anion Gap: 10 mEq/L (ref 3–11)
BUN: 20 mg/dL (ref 7.0–26.0)
CO2: 23 mEq/L (ref 22–29)
Calcium: 8.8 mg/dL (ref 8.4–10.4)
Chloride: 103 mEq/L (ref 98–109)
Creatinine: 1.2 mg/dL (ref 0.7–1.3)
EGFR: 55 mL/min/{1.73_m2} — ABNORMAL LOW (ref >90)
Glucose: 105 mg/dl (ref 70–140)
Potassium: 4.7 mEq/L (ref 3.5–5.1)
Sodium: 137 mEq/L (ref 136–145)
Total Bilirubin: 0.5 mg/dL (ref 0.20–1.20)
Total Protein: 6.2 g/dL — ABNORMAL LOW (ref 6.4–8.3)

## 2015-04-02 LAB — LACTATE DEHYDROGENASE (CC13): LDH: 257 U/L — ABNORMAL HIGH (ref 125–245)

## 2015-04-02 NOTE — Progress Notes (Signed)
[place] [phone]   [phone]  CONSULT NOTE  REFERRING PHYSICIAN: Dr. [name].  REASON FOR CONSULTATION:  81 years old white male with history of follicular lymphoma to establish care with local medical oncologist.  HPI Jacob Warner is a 81 y.o. male with multiple medical problems who was diagnosed in May 80[birth year] with follicular lymphoma of the mesentery after the patient presented with abdominal pain. This was diagnosed in [facility] under the care of Dr. Rockney Ghee. He had a CT guided core biopsy at that time that confirmed the diagnosis. He was treated with a course of Rituxan, Cytoxan and prednisone between 05/20/2007 through 10/20/2007. He tolerated his treatment well with no significant adverse effects. The patient has been observation since that time. His last CT scan of the chest was performed at the [facility] in [place] in 2014 and it showed stable disease. He was followed by Dr. Truman Hayward on annual basis with repeat blood work. The patient recently moved to [facility] to be close to his sons. He establish care with a primary care physician Dr. Justin Warner and requested referral to medical oncology for evaluation of his follicular lymphoma. The patient is feeling fine today except for pain in the right hip after fall and fracturing in the right hip and pelvis several weeks ago. He is scheduled to see an orthopedic surgeon next week. The patient is feeling fine today with no specific complaints except for the right hip pain. He denied having any significant chest pain, shortness breath, cough or hemoptysis. He denied having any significant weight loss or night sweats. The patient denied having any nausea, vomiting, abdominal pain, diarrhea or constipation. HPI  PAST MEDICAL HISTORY: Significant for multiple medical problems including atrial fibrillation, sick sinus syndrome, anemia, pacemaker  placement, spinal stenosis, glaucoma, bradycardia, follicular lymphoma diagnosed in 2008, history of kidney stone, GERD, hypertension, benign prostatic hypertrophy, TIA as well as a status post right orchiectomy.  FAMILY HISTORY: Father had prostate cancer, mother had lymphoma, sister with breast cancer and another sister with brain cancer. He also has a brother diagnosed with cancer.   SOCIAL HISTORY: The patient is married and has 2 sons, one of them lives in [place]. He has no history of smoking, alcohol or drug abuse. He is currently retired.   Social History History  Substance Use Topics  . Smoking status: Not on file  . Smokeless tobacco: Not on file  . Alcohol Use: Not on file    Allergies  Allergen Reactions  . Sulfa Antibiotics Hives and Rash    Bactrim    Current Outpatient Prescriptions  Medication Sig Dispense Refill  . acetaminophen (TYLENOL) 500 MG tablet Take 1,000 mg by mouth daily. Or if needed every 6 hours for pain    . amiodarone (PACERONE) 200 MG tablet Take 200 mg by mouth daily.    Marland Kitchen aspirin 81 MG tablet Take 81 mg by mouth daily.    . Cyanocobalamin (VITAMIN B-12 PO) Take 1 tablet by mouth daily.    Marland Kitchen docusate sodium (COLACE) 100 MG capsule Take 1 capsule (100 mg total) by mouth every 12 (twelve) hours. 60 capsule 0  . doxazosin (CARDURA) 4 MG tablet Take 4 mg by mouth at bedtime.    . dutasteride (AVODART) 0.5 MG capsule Take 0.5 mg by mouth daily.    Marland Kitchen erythromycin ophthalmic ointment   1  . gabapentin (NEURONTIN) 300 MG capsule Take 300  mg by mouth daily.    Marland Kitchen lactulose (CHRONULAC) 10 GM/15ML solution   0  . latanoprost (XALATAN) 0.005 % ophthalmic solution Place 1 drop into both eyes at bedtime.    . Magnesium Hydroxide (MAGNESIA PO) Take 400 mg by mouth daily.    . magnesium oxide (MAG-OX) 400 (241.3 MG) MG tablet Take 1 tablet by mouth 2 (two) times daily.  2  . oxyCODONE-acetaminophen (PERCOCET/ROXICET) 5-325 MG per tablet Take 1 tablet by mouth  every 6 (six) hours as needed. 20 tablet 0  . PRILOSEC OTC 20 MG tablet Take 20 mg by mouth daily.  0  . PULMICORT FLEXHALER 180 MCG/ACT inhaler     . Vitamin D, Ergocalciferol, (DRISDOL) 50000 UNITS CAPS capsule   0  . brimonidine (ALPHAGAN P) 0.1 % SOLN Place 1-2 drops into both eyes daily.     No current facility-administered medications for this visit.    Review of Systems  Constitutional: negative Eyes: negative Ears, nose, mouth, throat, and face: negative Respiratory: negative Cardiovascular: negative Gastrointestinal: negative Genitourinary:negative Integument/breast: negative Hematologic/lymphatic: negative Musculoskeletal:positive for bone pain Neurological: negative Behavioral/Psych: negative Endocrine: negative Allergic/Immunologic: negative  Physical Exam  BSW:HQPRF, healthy, no distress, well nourished and well developed SKIN: skin color, texture, turgor are normal, no rashes or significant lesions HEAD: Normocephalic, No masses, lesions, tenderness or abnormalities EYES: normal, PERRLA EARS: External ears normal, Canals clear OROPHARYNX:no exudate, no erythema and lips, buccal mucosa, and tongue normal  NECK: supple, no adenopathy, no JVD LYMPH:  no palpable lymphadenopathy, no hepatosplenomegaly LUNGS: clear to auscultation , and palpation HEART: regular rate & rhythm, no murmurs and no gallops ABDOMEN:abdomen soft, non-tender, normal bowel sounds and no masses or organomegaly BACK: Back symmetric, no curvature., No CVA tenderness EXTREMITIES:no joint deformities, effusion, or inflammation, no edema, no skin discoloration  NEURO: alert & oriented x 3 with fluent speech, no focal motor/sensory deficits  PERFORMANCE STATUS: ECOG 1  LABORATORY DATA: Lab Results  Component Value Date   WBC 4.8 04/02/2015   HGB 11.6* 04/02/2015   HCT 35.1* 04/02/2015   MCV 97.4 04/02/2015   PLT 151 04/02/2015      Chemistry      Component Value Date/Time   NA 137  04/02/2015 1403   K 4.7 04/02/2015 1403   CO2 23 04/02/2015 1403   BUN 20.0 04/02/2015 1403   CREATININE 1.2 04/02/2015 1403      Component Value Date/Time   CALCIUM 8.8 04/02/2015 1403   ALKPHOS 40 04/02/2015 1403   AST 23 04/02/2015 1403   ALT 31 04/02/2015 1403   BILITOT 0.50 04/02/2015 1403       RADIOGRAPHIC STUDIES: No results found.  ASSESSMENT: This is a very pleasant 81 years old white male with history of follicular lymphoma of the mesentery diagnosed in May 2008 status post systemic chemotherapy with Cytoxan and prednisone and Rituxan with no evidence for disease progression since that time. The patient is here today to establish care with a local medical oncologist.  PLAN: I had a lengthy discussion with the patient and his wife today about his disease status and treatment options. The patient has been observation for almost 8 years now with no evidence for disease progression. I recommended for the patient to continue on observation with annual blood work. I would not consider the patient for imaging studies on this he has any concerning symptoms. He will continue his routine follow-up visit with his primary care physician as a scheduled. I will see the patient  back for follow-up visit in one year with repeat CBC, comprehensive metabolic panel and LDH. He was advised to call immediately if he has any concerning symptoms in the interval.  The patient voices understanding of current disease status and treatment options and is in agreement with the current care plan.  All questions were answered. The patient knows to call the clinic with any problems, questions or concerns. We can certainly see the patient much sooner if necessary.  Thank you so much for allowing me to participate in the care of Jacob Warner. I will continue to follow up the patient with you and assist in his care.  I spent 40 minutes counseling the patient face to face. The total time spent in the  appointment was 60 minutes.  Disclaimer: This note was dictated with voice recognition software. Similar sounding words can inadvertently be transcribed and may not be corrected upon review.   Mercie Balsley K. April 02, 2015, 2:45 PM

## 2015-04-03 ENCOUNTER — Telehealth: Payer: Self-pay | Admitting: Internal Medicine

## 2015-04-03 DIAGNOSIS — M25551 Pain in right hip: Secondary | ICD-10-CM | POA: Diagnosis not present

## 2015-04-03 NOTE — Telephone Encounter (Signed)
spoke with patient wife and advise don April 2017 appt.Marland KitchenMarland KitchenMarland KitchenMarland Kitchenpt ok and aware

## 2015-04-06 ENCOUNTER — Other Ambulatory Visit: Payer: Self-pay | Admitting: Orthopedic Surgery

## 2015-04-06 DIAGNOSIS — M545 Low back pain, unspecified: Secondary | ICD-10-CM

## 2015-04-09 ENCOUNTER — Ambulatory Visit: Payer: Medicare Other

## 2015-04-09 ENCOUNTER — Ambulatory Visit: Payer: Medicare Other | Admitting: Hematology

## 2015-04-11 DIAGNOSIS — M810 Age-related osteoporosis without current pathological fracture: Secondary | ICD-10-CM | POA: Diagnosis not present

## 2015-04-12 DIAGNOSIS — H4011X2 Primary open-angle glaucoma, moderate stage: Secondary | ICD-10-CM | POA: Diagnosis not present

## 2015-04-13 DIAGNOSIS — H4011X3 Primary open-angle glaucoma, severe stage: Secondary | ICD-10-CM | POA: Diagnosis not present

## 2015-04-13 DIAGNOSIS — H04123 Dry eye syndrome of bilateral lacrimal glands: Secondary | ICD-10-CM | POA: Diagnosis not present

## 2015-04-17 ENCOUNTER — Ambulatory Visit: Payer: Medicare Other | Admitting: Cardiology

## 2015-04-18 ENCOUNTER — Encounter: Payer: Self-pay | Admitting: Cardiology

## 2015-04-19 ENCOUNTER — Ambulatory Visit
Admission: RE | Admit: 2015-04-19 | Discharge: 2015-04-19 | Disposition: A | Discharge status: Home / Self Care | Payer: Medicare Other | Source: Ambulatory Visit | Attending: Orthopedic Surgery | Admitting: Orthopedic Surgery

## 2015-04-19 DIAGNOSIS — M545 Low back pain, unspecified: Secondary | ICD-10-CM

## 2015-04-19 DIAGNOSIS — M5127 Other intervertebral disc displacement, lumbosacral region: Secondary | ICD-10-CM | POA: Diagnosis not present

## 2015-04-19 DIAGNOSIS — M4806 Spinal stenosis, lumbar region: Secondary | ICD-10-CM | POA: Diagnosis not present

## 2015-04-19 DIAGNOSIS — M47817 Spondylosis without myelopathy or radiculopathy, lumbosacral region: Secondary | ICD-10-CM | POA: Diagnosis not present

## 2015-04-19 DIAGNOSIS — M4856XA Collapsed vertebra, not elsewhere classified, lumbar region, initial encounter for fracture: Secondary | ICD-10-CM | POA: Diagnosis not present

## 2015-04-19 MED ORDER — IOHEXOL 180 MG/ML  SOLN
15.0000 mL | Freq: Once | INTRAMUSCULAR | Status: AC | PRN
  Administered 2015-04-19: 15 mL via INTRATHECAL

## 2015-04-19 NOTE — Discharge Instructions (Signed)

## 2015-04-25 DIAGNOSIS — M810 Age-related osteoporosis without current pathological fracture: Secondary | ICD-10-CM | POA: Diagnosis not present

## 2015-04-30 DIAGNOSIS — T169XXA Foreign body in ear, unspecified ear, initial encounter: Secondary | ICD-10-CM | POA: Diagnosis not present

## 2015-05-01 DIAGNOSIS — M4806 Spinal stenosis, lumbar region: Secondary | ICD-10-CM | POA: Diagnosis not present

## 2015-05-24 DIAGNOSIS — M5416 Radiculopathy, lumbar region: Secondary | ICD-10-CM | POA: Diagnosis not present

## 2015-05-25 DIAGNOSIS — H4011X3 Primary open-angle glaucoma, severe stage: Secondary | ICD-10-CM | POA: Diagnosis not present

## 2015-05-31 ENCOUNTER — Encounter: Payer: Self-pay | Admitting: Internal Medicine

## 2015-05-31 ENCOUNTER — Ambulatory Visit (INDEPENDENT_AMBULATORY_CARE_PROVIDER_SITE_OTHER): Payer: Medicare Other | Admitting: *Deleted

## 2015-05-31 ENCOUNTER — Encounter: Payer: Self-pay | Admitting: Cardiology

## 2015-05-31 ENCOUNTER — Ambulatory Visit (INDEPENDENT_AMBULATORY_CARE_PROVIDER_SITE_OTHER): Payer: Medicare Other | Admitting: Cardiology

## 2015-05-31 VITALS — BP 118/60 | HR 75 | Ht 71.0 in | Wt 213.1 lb

## 2015-05-31 DIAGNOSIS — Z950 Presence of cardiac pacemaker: Secondary | ICD-10-CM | POA: Diagnosis not present

## 2015-05-31 DIAGNOSIS — I4729 Other ventricular tachycardia: Secondary | ICD-10-CM

## 2015-05-31 DIAGNOSIS — I472 Ventricular tachycardia: Secondary | ICD-10-CM | POA: Insufficient documentation

## 2015-05-31 DIAGNOSIS — Z79899 Other long term (current) drug therapy: Secondary | ICD-10-CM

## 2015-05-31 DIAGNOSIS — R001 Bradycardia, unspecified: Secondary | ICD-10-CM

## 2015-05-31 DIAGNOSIS — R011 Cardiac murmur, unspecified: Secondary | ICD-10-CM

## 2015-05-31 DIAGNOSIS — R609 Edema, unspecified: Secondary | ICD-10-CM | POA: Diagnosis not present

## 2015-05-31 DIAGNOSIS — I495 Sick sinus syndrome: Secondary | ICD-10-CM | POA: Insufficient documentation

## 2015-05-31 LAB — HEPATIC FUNCTION PANEL
ALT: 32 U/L (ref 0–53)
AST: 25 U/L (ref 0–37)
Albumin: 4.1 g/dL (ref 3.5–5.2)
Alkaline Phosphatase: 31 U/L — ABNORMAL LOW (ref 39–117)
Bilirubin, Direct: 0.1 mg/dL (ref 0.0–0.3)
Total Bilirubin: 0.5 mg/dL (ref 0.2–1.2)
Total Protein: 6.3 g/dL (ref 6.0–8.3)

## 2015-05-31 LAB — BASIC METABOLIC PANEL
BUN: 18 mg/dL (ref 6–23)
CO2: 28 mEq/L (ref 19–32)
Calcium: 8.8 mg/dL (ref 8.4–10.5)
Chloride: 100 mEq/L (ref 96–112)
Creatinine, Ser: 1.15 mg/dL (ref 0.40–1.50)
GFR: 63.69 mL/min (ref >60.00)
Glucose, Bld: 95 mg/dL (ref 70–99)
Potassium: 4.5 mEq/L (ref 3.5–5.1)
Sodium: 133 mEq/L — ABNORMAL LOW (ref 135–145)

## 2015-05-31 LAB — CBC WITH DIFFERENTIAL/PLATELET
Basophils Absolute: 0 10*3/uL (ref 0.0–0.1)
Basophils Relative: 0.7 % (ref 0.0–3.0)
Eosinophils Absolute: 0.1 10*3/uL (ref 0.0–0.7)
Eosinophils Relative: 2 % (ref 0.0–5.0)
HCT: 35 % — ABNORMAL LOW (ref 39.0–52.0)
Hemoglobin: 11.9 g/dL — ABNORMAL LOW (ref 13.0–17.0)
Lymphocytes Relative: 18.2 % (ref 12.0–46.0)
Lymphs Abs: 1.1 10*3/uL (ref 0.7–4.0)
MCHC: 34 g/dL (ref 30.0–36.0)
MCV: 98.3 fl (ref 78.0–100.0)
Monocytes Absolute: 0.4 10*3/uL (ref 0.1–1.0)
Monocytes Relative: 7.2 % (ref 3.0–12.0)
Neutro Abs: 4.3 10*3/uL (ref 1.4–7.7)
Neutrophils Relative %: 71.9 % (ref 43.0–77.0)
Platelets: 159 10*3/uL (ref 150.0–400.0)
RBC: 3.56 Mil/uL — ABNORMAL LOW (ref 4.22–5.81)
RDW: 13.3 % (ref 11.5–15.5)
WBC: 6 10*3/uL (ref 4.0–10.5)

## 2015-05-31 LAB — TSH: TSH: 1.15 u[IU]/mL (ref 0.35–4.50)

## 2015-05-31 NOTE — Progress Notes (Signed)
Cardiology Office Note   Date:  05/31/2015   ID:  Jacob Warner, DOB 12/02/1924, MRN [id number]  PCP:  Jonathon Bellows, MD  Cardiologist: Darlin Coco MD  Chief Complaint  Patient presents with  . Appointment    new pt, bradycardia      History of Present Illness: Jacob Warner is a 81 y.o. male who presents for establishing cardiac care in the [place] area.  He moved here from [facility] in February 2016.  He moved here with his wife in order to live closer to his 2 sons who live in this area.  He is being seen today at the request of his PCP Dr. [name].  The patient has a past history of sick sinus syndrome.  He presented to his PCP in 2013 with a heart rate of 39.  He was having episodes of dizziness.  He has not had any history of syncope.  On 09/23/12 he underwent implantation of a Catering manager.  The site of implantation is in the right upper chest because he has the remnants of a previous Port-A-Cath in the left upper chest.  The patient has a past history of having had nodular lymphoma in 2008.  This was treated successfully with chemotherapy.  He has been in remission.  His oncologist here in [place] is Dr. Earlie Server. The patient does not have any history of high blood pressure or diabetes.  He denies any history of ischemic heart disease.  No history of congestive heart failure or angina pectoris.  He does have mild dyspnea on exertion.  He is not aware of any racing of his heart or palpitations. He has a past history of back surgery following which she had complications of a pulmonary embolus and a mini stroke.  The mini stroke resulted in some memory deficit.  The memory deficit does not appear to be progressive however.  The patient walks with a cane.  He no longer drives a car.  In June 2015 the patient had a mechanical fall and fractured his pelvis in multiple places.  He was hospitalized in [place] for one month and  then received additional physical therapy at home.  Several months ago he was experiencing increasing pain in his back and in his legs and went to [facility] initially where he was evaluated in the emergency room.  He subsequently saw Dr. Frederik Pear who diagnosed nerve damage as a cause of his pain.  He states that he subsequently had an injection for this by Dr. Mina Marble but that he has not noticed any improvement. The patient was noted on previous interrogation of his pacemaker to an event runs of nonsustained ventricular tachycardia.  There was also a question of paroxysmal atrial fibrillation.  He has been on low dose amiodarone.  He is on an 81 mg aspirin daily.    Past Medical History  Diagnosis Date  . Bradycardia   . Cancer   . TIA (transient ischemic attack)   . A-fib   . Anemia   . Pelvic fracture   . Sick sinus syndrome   . Alopecia   . Right bbb/left ant fasc block   . Glaucoma   . Xeroderma   . Cherry angioma   . GERD (gastroesophageal reflux disease)   . Hypertension     Past Surgical History  Procedure Laterality Date  . Pacemaker placement    . Appendectomy    . Exploratory laparotomy    .  Cervical spine surgery    . Lithotripsy       Current Outpatient Prescriptions  Medication Sig Dispense Refill  . acetaminophen (TYLENOL) 500 MG tablet Take 1,000 mg by mouth daily. Or if needed every 6 hours for pain    . amiodarone (PACERONE) 200 MG tablet Take 200 mg by mouth daily.    Marland Kitchen aspirin 81 MG tablet Take 81 mg by mouth daily.    . Cyanocobalamin (VITAMIN B-12 PO) Take 1 tablet by mouth daily.    Marland Kitchen doxazosin (CARDURA) 4 MG tablet Take 4 mg by mouth at bedtime.    . dutasteride (AVODART) 0.5 MG capsule Take 0.5 mg by mouth daily.    Marland Kitchen gabapentin (NEURONTIN) 300 MG capsule Take 300 mg by mouth daily.    Marland Kitchen lactulose (CHRONULAC) 10 GM/15ML solution   0  . LUMIGAN 0.01 % SOLN     . Magnesium Hydroxide (MAGNESIA PO) Take 400 mg by mouth daily.    . magnesium  oxide (MAG-OX) 400 (241.3 MG) MG tablet Take 1 tablet by mouth 2 (two) times daily.  2  . PRILOSEC OTC 20 MG tablet Take 20 mg by mouth daily.  0   No current facility-administered medications for this visit.    Allergies:   Sulfa antibiotics    Social History:  The patient  reports that he has never smoked. He does not have any smokeless tobacco history on file.   Family History:  The patient's family history includes Brain cancer in his sister.    ROS:  Please see the history of present illness.   Otherwise, review of systems are positive for none.   All other systems are reviewed and negative.    PHYSICAL EXAM: VS:  BP 118/60 mmHg  Pulse 75  Ht 5\' 11"  (1.803 m)  Wt 213 lb 1.9 oz (96.671 kg)  BMI 29.74 kg/m2 , BMI Body mass index is 29.74 kg/(m^2). GEN: Well nourished, well developed, in no acute distress HEENT: normal Neck: no JVD, carotid bruits, or masses Cardiac: RRR; there is a soft systolic ejection murmur at the base.  No gallop.  The pacemaker is in the right upper chest.  The old remnant of the Port-A-Cath is in the left upper chest.  There is 1+ pretibial edema bilaterally Respiratory:  clear to auscultation bilaterally, normal work of breathing GI: soft, nontender, nondistended, + BS MS: no deformity or atrophy Skin: warm and dry, no rash Neuro:  Strength and sensation are intact Psych: euthymic mood, full affect   EKG:  EKG is ordered today. The ekg ordered today demonstrates AV paced rhythm.   Recent Labs: 04/02/2015: ALT 31; BUN 20.0; Creatinine 1.2; Hemoglobin 11.6*; Platelets 151; Potassium 4.7; Sodium 137    Lipid Panel No results found for: CHOL, TRIG, HDL, CHOLHDL, VLDL, LDLCALC, LDLDIRECT    Wt Readings from Last 3 Encounters:  05/31/15 213 lb 1.9 oz (96.671 kg)  04/02/15 212 lb 9.6 oz (96.435 kg)         ASSESSMENT AND PLAN:  1.  Sick sinus syndrome 2.  Functioning dual-chamber [employer] pacemaker implanted 09/23/12 3.  Systolic  heart murmur 4.  Prior pulmonary embolus following back surgery several years ago, complicated also by mini stroke with resultant memory loss. 5.  Chronic amiodarone therapy. 6.  Questionable past history of runs of atrial fibrillation seen on previous interrogation of his pacemaker prior to moving to [place].  Currently on baby aspirin.  His chadsvasc score would be 4 based  on his age and prior history of mini stroke.  We will see what his up-to-date pacemaker interrogation demonstrates.  Consider switching to full anticoagulation if atrial fibrillation can be documented. 7.  History of nodular lymphoma, treated with chemotherapy in 2008, currently in remission 8.  Memory disorder 9.  Chronic back pain related to prior pelvic fractures and to subsequent nerve damage.  Plan: We will continue his current medications.  We will get baseline labs today to check on amiodarone including CBC TSH hepatic function panel and basal metabolic panel.  We will get a chest x-ray and we will get a 2-D echo. We will ask him to return here in 6 months for office visit CBC and TSH. We will also arrange for follow-up with EP to follow his pacemaker.  Many thanks for the opportunity to see this pleasant gentleman with you.  I will be in touch regarding the results of the echocardiogram.   Current medicines are reviewed at length with the patient today.  The patient does not have concerns regarding medicines.  The following changes have been made:  no change  Labs/ tests ordered today include:   Orders Placed This Encounter  Procedures  . DG Chest 2 View  . TSH  . Hepatic function panel  . Basic metabolic panel  . CBC with Differential/Platelet  . TSH  . CBC with Differential/Platelet  . Ambulatory referral to Cardiac Electrophysiology  . EKG 12-Lead  . ECHOCARDIOGRAM COMPLETE       Signed, Darlin Coco MD 05/31/2015 12:21 PM    [facility] [address],  [place], [place]  [address] Phone: [phone]; Fax: [phone]

## 2015-05-31 NOTE — Patient Instructions (Addendum)
Medication Instructions:  Your physician recommends that you continue on your current medications as directed. Please refer to the Current Medication list given to you today.  Labwork: CBC/TSH/HFP/BMET  Testing/Procedures: Your physician has requested that you have an echocardiogram. Echocardiography is a painless test that uses sound waves to create images of your heart. It provides your doctor with information about the size and shape of your heart and how well your heart's chambers and valves are working. This procedure takes approximately one hour. There are no restrictions for this procedure.  A chest x-ray takes a picture of the organs and structures inside the chest, including the heart, lungs, and blood vessels. This test can show several things, including, whether the heart is enlarges; whether fluid is building up in the lungs; and whether pacemaker / defibrillator leads are still in place. [facility] [place]   Follow-Up: Your physician wants you to follow-up in: 6 MONTH OV/CBC/TSH You will receive a reminder letter in the mail two months in advance. If you don't receive a letter, please call our office to schedule the follow-up appointment.   CONSULT WITH DR Lovena Le IN 3 MONTHS

## 2015-06-01 LAB — CUP PACEART INCLINIC DEVICE CHECK
Battery Remaining Longevity: 4.5
Brady Statistic RA Percent Paced: 93 %
Brady Statistic RV Percent Paced: 100 %
Date Time Interrogation Session: 20160602040000
Lead Channel Impedance Value: 409 Ohm
Lead Channel Impedance Value: 465 Ohm
Lead Channel Pacing Threshold Amplitude: 0.7 V
Lead Channel Pacing Threshold Amplitude: 0.8 V
Lead Channel Pacing Threshold Pulse Width: 0.4 ms
Lead Channel Pacing Threshold Pulse Width: 0.4 ms
Lead Channel Sensing Intrinsic Amplitude: 1.3 mV
Lead Channel Sensing Intrinsic Amplitude: 13.7 mV
Lead Channel Setting Pacing Amplitude: 2 V
Lead Channel Setting Pacing Amplitude: 2.4 V
Lead Channel Setting Pacing Pulse Width: 0.4 ms
Lead Channel Setting Sensing Sensitivity: 2.5 mV
Pulse Gen Serial Number: 138586
Zone Setting Detection Interval: 375 ms

## 2015-06-01 NOTE — Progress Notes (Signed)
Quick Note:  Please report to patient. The recent labs are stable. Continue same medication and careful diet. ______ 

## 2015-06-01 NOTE — Progress Notes (Signed)
Pacemaker check in clinic. Normal device function. Thresholds, sensing, impedances consistent with previous measurements. Device programmed to maximize longevity. 1 ATR episode x 10 sec @ 206/89---FFRW---PVAB increased from 125->180ms. No high ventricular rates noted. Device programmed at appropriate safety margins. Histogram distribution appropriate for patient activity level. Device programmed to optimize intrinsic conduction. Estimated longevity 4.5 years. Patient will follow up with GT in 3 months to establish.

## 2015-06-06 ENCOUNTER — Ambulatory Visit (HOSPITAL_COMMUNITY): Payer: Medicare Other | Attending: Cardiology

## 2015-06-06 ENCOUNTER — Other Ambulatory Visit: Payer: Self-pay

## 2015-06-06 DIAGNOSIS — I059 Rheumatic mitral valve disease, unspecified: Secondary | ICD-10-CM | POA: Diagnosis not present

## 2015-06-06 DIAGNOSIS — I371 Nonrheumatic pulmonary valve insufficiency: Secondary | ICD-10-CM | POA: Diagnosis not present

## 2015-06-06 DIAGNOSIS — I340 Nonrheumatic mitral (valve) insufficiency: Secondary | ICD-10-CM | POA: Insufficient documentation

## 2015-06-06 DIAGNOSIS — R011 Cardiac murmur, unspecified: Secondary | ICD-10-CM | POA: Diagnosis not present

## 2015-06-06 DIAGNOSIS — I071 Rheumatic tricuspid insufficiency: Secondary | ICD-10-CM | POA: Insufficient documentation

## 2015-06-08 ENCOUNTER — Telehealth: Payer: Self-pay | Admitting: *Deleted

## 2015-06-08 DIAGNOSIS — M5416 Radiculopathy, lumbar region: Secondary | ICD-10-CM | POA: Diagnosis not present

## 2015-06-08 NOTE — Telephone Encounter (Signed)
Notes Recorded by Earvin Hansen on 06/07/2015 at 6:40 PM Advised wife of results, patient has hard time hearing

## 2015-06-08 NOTE — Telephone Encounter (Signed)
-----   Message from Darlin Coco, MD sent at 06/06/2015  7:17 PM EDT ----- Please report.  The echocardiogram is satisfactory.  Left ventricular systolic function is satisfactory.  There is grade 1 diastolic dysfunction.  There is mild mitral regurgitation which may account for the heart murmur.  The aortic valve was okay.  Continue current medication.  Send a copy of the echo to Dr. [name]

## 2015-06-19 ENCOUNTER — Ambulatory Visit
Admission: RE | Admit: 2015-06-19 | Discharge: 2015-06-19 | Disposition: A | Discharge status: Home / Self Care | Payer: Medicare Other | Source: Ambulatory Visit | Attending: Cardiology | Admitting: Cardiology

## 2015-06-19 DIAGNOSIS — R011 Cardiac murmur, unspecified: Secondary | ICD-10-CM | POA: Diagnosis not present

## 2015-06-19 DIAGNOSIS — Z79899 Other long term (current) drug therapy: Secondary | ICD-10-CM

## 2015-06-19 DIAGNOSIS — J449 Chronic obstructive pulmonary disease, unspecified: Secondary | ICD-10-CM | POA: Diagnosis not present

## 2015-06-19 DIAGNOSIS — Z950 Presence of cardiac pacemaker: Secondary | ICD-10-CM

## 2015-06-19 DIAGNOSIS — R001 Bradycardia, unspecified: Secondary | ICD-10-CM

## 2015-06-26 DIAGNOSIS — H04121 Dry eye syndrome of right lacrimal gland: Secondary | ICD-10-CM | POA: Diagnosis not present

## 2015-06-26 DIAGNOSIS — H04122 Dry eye syndrome of left lacrimal gland: Secondary | ICD-10-CM | POA: Diagnosis not present

## 2015-06-26 DIAGNOSIS — H4011X3 Primary open-angle glaucoma, severe stage: Secondary | ICD-10-CM | POA: Diagnosis not present

## 2015-06-27 DIAGNOSIS — Z0000 Encounter for general adult medical examination without abnormal findings: Secondary | ICD-10-CM | POA: Diagnosis not present

## 2015-06-27 DIAGNOSIS — Z5181 Encounter for therapeutic drug level monitoring: Secondary | ICD-10-CM | POA: Diagnosis not present

## 2015-06-27 DIAGNOSIS — Z136 Encounter for screening for cardiovascular disorders: Secondary | ICD-10-CM | POA: Diagnosis not present

## 2015-07-11 DIAGNOSIS — M5416 Radiculopathy, lumbar region: Secondary | ICD-10-CM | POA: Diagnosis not present

## 2015-07-16 DIAGNOSIS — H4011X3 Primary open-angle glaucoma, severe stage: Secondary | ICD-10-CM | POA: Diagnosis not present

## 2015-07-20 DIAGNOSIS — B0052 Herpesviral keratitis: Secondary | ICD-10-CM | POA: Diagnosis not present

## 2015-07-24 ENCOUNTER — Other Ambulatory Visit: Payer: Self-pay | Admitting: Gastroenterology

## 2015-07-24 DIAGNOSIS — R1310 Dysphagia, unspecified: Secondary | ICD-10-CM | POA: Diagnosis not present

## 2015-07-24 DIAGNOSIS — Z1211 Encounter for screening for malignant neoplasm of colon: Secondary | ICD-10-CM | POA: Diagnosis not present

## 2015-07-27 ENCOUNTER — Other Ambulatory Visit: Payer: Medicare Other

## 2015-07-27 DIAGNOSIS — H04123 Dry eye syndrome of bilateral lacrimal glands: Secondary | ICD-10-CM | POA: Diagnosis not present

## 2015-07-31 ENCOUNTER — Encounter (INDEPENDENT_AMBULATORY_CARE_PROVIDER_SITE_OTHER): Payer: Self-pay

## 2015-07-31 ENCOUNTER — Ambulatory Visit
Admission: RE | Admit: 2015-07-31 | Discharge: 2015-07-31 | Disposition: A | Discharge status: Home / Self Care | Payer: Medicare Other | Source: Ambulatory Visit | Attending: Gastroenterology | Admitting: Gastroenterology

## 2015-07-31 DIAGNOSIS — R1310 Dysphagia, unspecified: Secondary | ICD-10-CM

## 2015-07-31 DIAGNOSIS — K449 Diaphragmatic hernia without obstruction or gangrene: Secondary | ICD-10-CM | POA: Diagnosis not present

## 2015-08-03 DIAGNOSIS — H04121 Dry eye syndrome of right lacrimal gland: Secondary | ICD-10-CM | POA: Diagnosis not present

## 2015-08-10 ENCOUNTER — Other Ambulatory Visit: Payer: Self-pay | Admitting: Cardiology

## 2015-08-10 DIAGNOSIS — H04123 Dry eye syndrome of bilateral lacrimal glands: Secondary | ICD-10-CM | POA: Diagnosis not present

## 2015-08-10 NOTE — Telephone Encounter (Signed)
Discussed with  Dr. Mare Ferrari and the Amiodarone should be once daily Refill sent to pharmacy

## 2015-08-10 NOTE — Telephone Encounter (Signed)
Should this be qd or bid? Please advise. Thanks, MI 

## 2015-08-10 NOTE — Telephone Encounter (Signed)
Okay to refill amiodarone?

## 2015-08-13 ENCOUNTER — Telehealth: Payer: Self-pay | Admitting: *Deleted

## 2015-08-13 NOTE — Telephone Encounter (Signed)
PT. HAS HAD PROBLEMS WITH CONSTIPATION FOR THREE MONTHS. HE HAS TAKEN LACTULOSE TWICE A DAY AND HIS BOWEL MOVEMENTS ARE LIKE "STRINGS". "PT. HAD THIS SAME PROBLEM WHEN HE WAS DIAGNOSED WITH CANCER".THERE IS NO BLOOD IN HIS STOOLS. PT.'S ABDOMINAL PAIN IS CONSTANT AND DULL AT A SCALE OF FOUR. TYLENOL HELPS VERY LITTLE. SPOKE TO DR.MOHAMED'S NURSE, DIANE BELL,RN. NOTIFIED PT.'S WIFE TO CONTACT PT.'S PRIMARY CARE PHYSICIAN. SHE VOICES UNDERSTANDING.

## 2015-08-21 ENCOUNTER — Other Ambulatory Visit: Payer: Self-pay | Admitting: Family Medicine

## 2015-08-21 DIAGNOSIS — C8590 Non-Hodgkin lymphoma, unspecified, unspecified site: Secondary | ICD-10-CM | POA: Diagnosis not present

## 2015-08-21 DIAGNOSIS — R109 Unspecified abdominal pain: Secondary | ICD-10-CM | POA: Diagnosis not present

## 2015-08-21 DIAGNOSIS — R197 Diarrhea, unspecified: Secondary | ICD-10-CM | POA: Diagnosis not present

## 2015-08-24 ENCOUNTER — Ambulatory Visit
Admission: RE | Admit: 2015-08-24 | Discharge: 2015-08-24 | Disposition: A | Discharge status: Home / Self Care | Payer: Medicare Other | Source: Ambulatory Visit | Attending: Family Medicine | Admitting: Family Medicine

## 2015-08-24 DIAGNOSIS — R109 Unspecified abdominal pain: Secondary | ICD-10-CM

## 2015-08-24 DIAGNOSIS — R1033 Periumbilical pain: Secondary | ICD-10-CM | POA: Diagnosis not present

## 2015-08-24 MED ORDER — IOPAMIDOL (ISOVUE-300) INJECTION 61%
125.0000 mL | Freq: Once | INTRAVENOUS | Status: AC | PRN
  Administered 2015-08-24: 125 mL via INTRAVENOUS

## 2015-08-28 ENCOUNTER — Ambulatory Visit (INDEPENDENT_AMBULATORY_CARE_PROVIDER_SITE_OTHER): Payer: Medicare Other | Admitting: Internal Medicine

## 2015-08-28 ENCOUNTER — Encounter: Payer: Self-pay | Admitting: Internal Medicine

## 2015-08-28 VITALS — BP 128/66 | HR 75 | Ht 68.0 in | Wt 216.0 lb

## 2015-08-28 DIAGNOSIS — I495 Sick sinus syndrome: Secondary | ICD-10-CM | POA: Diagnosis not present

## 2015-08-28 DIAGNOSIS — Z950 Presence of cardiac pacemaker: Secondary | ICD-10-CM | POA: Diagnosis not present

## 2015-08-28 DIAGNOSIS — R001 Bradycardia, unspecified: Secondary | ICD-10-CM | POA: Diagnosis not present

## 2015-08-28 DIAGNOSIS — M47816 Spondylosis without myelopathy or radiculopathy, lumbar region: Secondary | ICD-10-CM | POA: Diagnosis not present

## 2015-08-28 DIAGNOSIS — R609 Edema, unspecified: Secondary | ICD-10-CM

## 2015-08-28 LAB — CUP PACEART INCLINIC DEVICE CHECK
Brady Statistic RA Percent Paced: 100 %
Brady Statistic RV Percent Paced: 100 %
Date Time Interrogation Session: 20160830040000
Lead Channel Impedance Value: 427 Ohm
Lead Channel Impedance Value: 483 Ohm
Lead Channel Pacing Threshold Amplitude: 0.7 V
Lead Channel Pacing Threshold Amplitude: 0.7 V
Lead Channel Pacing Threshold Pulse Width: 0.4 ms
Lead Channel Pacing Threshold Pulse Width: 0.4 ms
Lead Channel Sensing Intrinsic Amplitude: 1.2 mV
Lead Channel Sensing Intrinsic Amplitude: 11.3 mV
Lead Channel Setting Pacing Amplitude: 2 V
Lead Channel Setting Pacing Amplitude: 2.4 V
Lead Channel Setting Pacing Pulse Width: 0.4 ms
Lead Channel Setting Sensing Sensitivity: 2.5 mV
Pulse Gen Serial Number: 138586
Zone Setting Detection Interval: 375 ms

## 2015-08-28 NOTE — Patient Instructions (Signed)
Medication Instructions:  Your physician recommends that you continue on your current medications as directed. Please refer to the Current Medication list given to you today.   Labwork: None ordered  Testing/Procedures: None ordered  Follow-Up: Your physician wants you to follow-up in: 12 months with Dr Taylor You will receive a reminder letter in the mail two months in advance. If you don't receive a letter, please call our office to schedule the follow-up appointment.  Remote monitoring is used to monitor your Pacemaker  from home. This monitoring reduces the number of office visits required to check your device to one time per year. It allows us to keep an eye on the functioning of your device to ensure it is working properly. You are scheduled for a device check from home on 11/27/15. You may send your transmission at any time that day. If you have a wireless device, the transmission will be sent automatically. After your physician reviews your transmission, you will receive a postcard with your next transmission date.    Any Other Special Instructions Will Be Listed Below (If Applicable).   

## 2015-08-28 NOTE — Assessment & Plan Note (Signed)
He has venous insufficiency. He is encouraged to maintain a low sodium diet.  Will follow. He will keep his legs elevated

## 2015-08-28 NOTE — Progress Notes (Signed)
HPI Jacob Warner returns today for followup of his PPM. He is a pleasant 81 yo man with symptomatic bradycardia and PAF, s/p PPM insertion. In the interim he has been stable. He leads a sedentary lifestyle but denies chest pain or sob. No syncope.  Allergies  Allergen Reactions  . Sulfa Antibiotics Hives and Rash    Bactrim     Current Outpatient Prescriptions  Medication Sig Dispense Refill  . acetaminophen (TYLENOL) 500 MG tablet Take 1,000 mg by mouth daily. Or if needed every 6 hours for pain    . amiodarone (PACERONE) 200 MG tablet Take 1 tablet (200 mg total) by mouth daily. 30 tablet 5  . aspirin 81 MG tablet Take 81 mg by mouth daily.    . ciprofloxacin (CIPRO) 500 MG tablet TAKE 1 TABLET BY MOUTH TWICE A DAY FOR 5-10 DAYS  0  . Cyanocobalamin (VITAMIN B-12 PO) Take 1 tablet by mouth daily.    Marland Kitchen doxazosin (CARDURA) 4 MG tablet Take 4 mg by mouth at bedtime.    . dutasteride (AVODART) 0.5 MG capsule Take 0.5 mg by mouth daily.    Marland Kitchen gabapentin (NEURONTIN) 300 MG capsule Take 300 mg by mouth daily.    . Magnesium Hydroxide (MAGNESIA PO) Take 400 mg by mouth daily.    . magnesium oxide (MAG-OX) 400 (241.3 MG) MG tablet Take 1 tablet by mouth 2 (two) times daily.  2  . PRILOSEC OTC 20 MG tablet Take 20 mg by mouth daily.  0  . lactulose (CHRONULAC) 10 GM/15ML solution Take 10 g by mouth 2 (two) times daily as needed for mild constipation.   0  . LUMIGAN 0.01 % SOLN Place 1 drop into both eyes at bedtime.      No current facility-administered medications for this visit.     Past Medical History  Diagnosis Date  . Bradycardia   . Cancer   . TIA (transient ischemic attack)   . A-fib   . Anemia   . Pelvic fracture   . Sick sinus syndrome   . Alopecia   . Right bbb/left ant fasc block   . Glaucoma   . Xeroderma   . Cherry angioma   . GERD (gastroesophageal reflux disease)   . Hypertension     ROS:   All systems reviewed and negative except as noted in the  HPI.   Past Surgical History  Procedure Laterality Date  . Pacemaker placement    . Appendectomy    . Exploratory laparotomy    . Cervical spine surgery    . Lithotripsy       Family History  Problem Relation Age of Onset  . Brain cancer Sister      Social History   Social History  . Marital Status: Married    Spouse Name: N/A  . Number of Children: N/A  . Years of Education: N/A   Occupational History  . Not on file.   Social History Main Topics  . Smoking status: Never Smoker   . Smokeless tobacco: Not on file  . Alcohol Use: Not on file  . Drug Use: Not on file  . Sexual Activity: Not on file   Other Topics Concern  . Not on file   Social History Narrative     BP 128/66 mmHg  Pulse 75  Ht 5\' 8"  (1.727 m)  Wt 216 lb (97.977 kg)  BMI 32.85 kg/m2  Physical Exam:  Well appearing elderly man, NAD  HEENT: Unremarkable Neck:  No JVD, no thyromegally Lymphatics:  No adenopathy Back:  No CVA tenderness Lungs:  Clear with no wheezes HEART:  Regular rate rhythm, no murmurs, no rubs, no clicks Abd:  soft, positive bowel sounds, no organomegally, no rebound, no guarding Ext:  2 plus pulses, no edema, no cyanosis, no clubbing Skin:  No rashes no nodules Neuro:  CN II through XII intact, motor grossly intact  DEVICE  Normal device function.  See PaceArt for details.   Assess/Plan:

## 2015-08-28 NOTE — Assessment & Plan Note (Signed)
His [employer] DDD PM is working normally. Will recheck in several months.

## 2015-08-31 ENCOUNTER — Encounter: Payer: Self-pay | Admitting: Internal Medicine

## 2015-09-07 DIAGNOSIS — H04121 Dry eye syndrome of right lacrimal gland: Secondary | ICD-10-CM | POA: Diagnosis not present

## 2015-10-12 DIAGNOSIS — M791 Myalgia: Secondary | ICD-10-CM | POA: Diagnosis not present

## 2015-10-12 DIAGNOSIS — M5416 Radiculopathy, lumbar region: Secondary | ICD-10-CM | POA: Diagnosis not present

## 2015-10-15 DIAGNOSIS — Z23 Encounter for immunization: Secondary | ICD-10-CM | POA: Diagnosis not present

## 2015-10-16 DIAGNOSIS — R350 Frequency of micturition: Secondary | ICD-10-CM | POA: Diagnosis not present

## 2015-10-16 DIAGNOSIS — N390 Urinary tract infection, site not specified: Secondary | ICD-10-CM | POA: Diagnosis not present

## 2015-10-16 DIAGNOSIS — N400 Enlarged prostate without lower urinary tract symptoms: Secondary | ICD-10-CM | POA: Diagnosis not present

## 2015-10-25 DIAGNOSIS — M810 Age-related osteoporosis without current pathological fracture: Secondary | ICD-10-CM | POA: Diagnosis not present

## 2015-11-02 DIAGNOSIS — M47816 Spondylosis without myelopathy or radiculopathy, lumbar region: Secondary | ICD-10-CM | POA: Diagnosis not present

## 2015-11-08 DIAGNOSIS — M79672 Pain in left foot: Secondary | ICD-10-CM | POA: Diagnosis not present

## 2015-11-08 DIAGNOSIS — N183 Chronic kidney disease, stage 3 (moderate): Secondary | ICD-10-CM | POA: Diagnosis not present

## 2015-11-08 DIAGNOSIS — M810 Age-related osteoporosis without current pathological fracture: Secondary | ICD-10-CM | POA: Diagnosis not present

## 2015-11-08 DIAGNOSIS — Z79899 Other long term (current) drug therapy: Secondary | ICD-10-CM | POA: Diagnosis not present

## 2015-11-27 ENCOUNTER — Ambulatory Visit (INDEPENDENT_AMBULATORY_CARE_PROVIDER_SITE_OTHER): Payer: Medicare Other | Admitting: *Deleted

## 2015-11-27 DIAGNOSIS — I495 Sick sinus syndrome: Secondary | ICD-10-CM

## 2015-11-27 LAB — CUP PACEART REMOTE DEVICE CHECK
Battery Remaining Longevity: 48 mo
Battery Remaining Percentage: 78 %
Brady Statistic RA Percent Paced: 100 %
Brady Statistic RV Percent Paced: 100 %
Date Time Interrogation Session: 20161129054100
Implantable Lead Implant Date: 20130926
Implantable Lead Implant Date: 20130926
Implantable Lead Location: 753859
Implantable Lead Location: 753860
Implantable Lead Model: 4456
Implantable Lead Model: 4476
Implantable Lead Serial Number: 473023
Implantable Lead Serial Number: 523746
Lead Channel Impedance Value: 386 Ohm
Lead Channel Impedance Value: 492 Ohm
Lead Channel Pacing Threshold Amplitude: 0.7 V
Lead Channel Pacing Threshold Pulse Width: 0.4 ms
Lead Channel Setting Pacing Amplitude: 2 V
Lead Channel Setting Pacing Amplitude: 2.4 V
Lead Channel Setting Pacing Pulse Width: 0.4 ms
Lead Channel Setting Sensing Sensitivity: 2.5 mV
Pulse Gen Serial Number: 138586

## 2015-11-28 ENCOUNTER — Ambulatory Visit: Payer: Medicare Other | Admitting: Cardiology

## 2015-11-28 ENCOUNTER — Other Ambulatory Visit: Payer: Medicare Other

## 2015-11-28 NOTE — Progress Notes (Signed)
Remote pacemaker transmission.   

## 2015-12-04 DIAGNOSIS — M47816 Spondylosis without myelopathy or radiculopathy, lumbar region: Secondary | ICD-10-CM | POA: Diagnosis not present

## 2015-12-06 DIAGNOSIS — H401112 Primary open-angle glaucoma, right eye, moderate stage: Secondary | ICD-10-CM | POA: Diagnosis not present

## 2015-12-06 DIAGNOSIS — H04123 Dry eye syndrome of bilateral lacrimal glands: Secondary | ICD-10-CM | POA: Diagnosis not present

## 2015-12-21 ENCOUNTER — Encounter: Payer: Self-pay | Admitting: Cardiology

## 2015-12-21 DIAGNOSIS — R350 Frequency of micturition: Secondary | ICD-10-CM | POA: Diagnosis not present

## 2015-12-21 DIAGNOSIS — M549 Dorsalgia, unspecified: Secondary | ICD-10-CM | POA: Diagnosis not present

## 2016-01-17 ENCOUNTER — Encounter: Payer: Self-pay | Admitting: Cardiology

## 2016-01-17 ENCOUNTER — Ambulatory Visit (INDEPENDENT_AMBULATORY_CARE_PROVIDER_SITE_OTHER): Payer: Medicare Other | Admitting: Cardiology

## 2016-01-17 ENCOUNTER — Other Ambulatory Visit: Payer: Medicare Other

## 2016-01-17 VITALS — BP 102/68 | HR 80 | Ht 72.0 in | Wt 220.8 lb

## 2016-01-17 DIAGNOSIS — I4729 Other ventricular tachycardia: Secondary | ICD-10-CM

## 2016-01-17 DIAGNOSIS — Z79899 Other long term (current) drug therapy: Secondary | ICD-10-CM

## 2016-01-17 DIAGNOSIS — I472 Ventricular tachycardia: Secondary | ICD-10-CM | POA: Diagnosis not present

## 2016-01-17 DIAGNOSIS — R011 Cardiac murmur, unspecified: Secondary | ICD-10-CM | POA: Diagnosis not present

## 2016-01-17 LAB — CBC WITH DIFFERENTIAL/PLATELET
Basophils Absolute: 0 10*3/uL (ref 0.0–0.1)
Basophils Relative: 1 % (ref 0–1)
Eosinophils Absolute: 0.2 10*3/uL (ref 0.0–0.7)
Eosinophils Relative: 4 % (ref 0–5)
HCT: 36.4 % — ABNORMAL LOW (ref 39.0–52.0)
Hemoglobin: 11.9 g/dL — ABNORMAL LOW (ref 13.0–17.0)
Lymphocytes Relative: 24 % (ref 12–46)
Lymphs Abs: 1 10*3/uL (ref 0.7–4.0)
MCH: 31.9 pg (ref 26.0–34.0)
MCHC: 32.7 g/dL (ref 30.0–36.0)
MCV: 97.6 fL (ref 78.0–100.0)
MPV: 10.3 fL (ref 8.6–12.4)
Monocytes Absolute: 0.4 10*3/uL (ref 0.1–1.0)
Monocytes Relative: 10 % (ref 3–12)
Neutro Abs: 2.5 10*3/uL (ref 1.7–7.7)
Neutrophils Relative %: 61 % (ref 43–77)
Platelets: 173 10*3/uL (ref 150–400)
RBC: 3.73 MIL/uL — ABNORMAL LOW (ref 4.22–5.81)
RDW: 13.5 % (ref 11.5–15.5)
WBC: 4.1 10*3/uL (ref 4.0–10.5)

## 2016-01-17 NOTE — Progress Notes (Signed)
Cardiology Office Note   Date:  01/17/2016   ID:  Jacob Warner, DOB 1924/09/19, MRN [id number]  PCP:  Jonathon Bellows, MD  Cardiologist: Darlin Coco MD  Chief Complaint  Patient presents with  . routine office visit    Patient denies chest pain, shortness of breath, le edema, or claudication      History of Present Illness: Jacob Warner is a 82 y.o. male who presents for 6 months scheduled follow-up visit   He moved here from [facility] in February 2016. He moved here with his wife in order to live closer to his 2 sons who live in this area.  His PCP is  Dr. [name]. The patient has a past history of sick sinus syndrome. He presented to his PCP in 2013 with a heart rate of 39. He was having episodes of dizziness. He has not had any history of syncope. On 09/23/12 he underwent implantation of a Catering manager. The site of implantation is in the right upper chest because he has the remnants of a previous Port-A-Cath in the left upper chest. The patient has a past history of having had nodular lymphoma in 2008. This was treated successfully with chemotherapy. He has been in remission. His oncologist here in [place] is Dr. Julien Nordmann. The patient does not have any history of high blood pressure or diabetes. He denies any history of ischemic heart disease. No history of congestive heart failure or angina pectoris. He does have mild dyspnea on exertion. He is not aware of any racing of his heart or palpitations. He has a past history of back surgery following which he had complications of a pulmonary embolus and a mini stroke. The mini stroke resulted in some memory deficit. The memory deficit does not appear to be progressive however. The patient walks with a cane. He no longer drives a car. In June 2015 the patient had a mechanical fall and fractured his pelvis in multiple places. He was hospitalized in [place] for one  month and then received additional physical therapy at home.he continues to have a lot of back pain. The patient was noted on previous interrogation of his pacemaker to an event runs of nonsustained ventricular tachycardia. There was also a question of paroxysmal atrial fibrillation. He has been on low dose amiodarone. He is on an 81 mg aspirin daily.he had a chest x-ray after his last visit which showed clear lungs and no evidence of amiodarone toxicity.  Past Medical History  Diagnosis Date  . Bradycardia   . Cancer ([place])   . TIA (transient ischemic attack)   . A-fib ([place])   . Anemia   . Pelvic fracture ([place])   . Sick sinus syndrome ([place])   . Alopecia   . Right bbb/left ant fasc block   . Glaucoma   . Xeroderma   . Cherry angioma   . GERD (gastroesophageal reflux disease)   . Hypertension     Past Surgical History  Procedure Laterality Date  . Pacemaker placement    . Appendectomy    . Exploratory laparotomy    . Cervical spine surgery    . Lithotripsy       Current Outpatient Prescriptions  Medication Sig Dispense Refill  . acetaminophen (TYLENOL) 500 MG tablet Take 1,000 mg by mouth daily. Or if needed every 6 hours for pain    . amiodarone (PACERONE) 200 MG tablet Take 1 tablet (200 mg total) by mouth daily.  30 tablet 5  . aspirin 81 MG tablet Take 81 mg by mouth daily.    . Cyanocobalamin (VITAMIN B-12 PO) Take 1 tablet by mouth daily.    Marland Kitchen doxazosin (CARDURA) 4 MG tablet Take 4 mg by mouth at bedtime.    . dutasteride (AVODART) 0.5 MG capsule Take 0.5 mg by mouth daily.    Marland Kitchen gabapentin (NEURONTIN) 300 MG capsule Take 300 mg by mouth daily.    Marland Kitchen lactulose (CHRONULAC) 10 GM/15ML solution Take 10 g by mouth 2 (two) times daily as needed for mild constipation.   0  . LUMIGAN 0.01 % SOLN Place 1 drop into both eyes at bedtime.     . Magnesium Hydroxide (MAGNESIA PO) Take 400 mg by mouth daily.    . magnesium oxide (MAG-OX) 400 (241.3 MG) MG tablet Take 1 tablet by  mouth 2 (two) times daily.  2  . omeprazole (PRILOSEC) 10 MG capsule Take 10 mg by mouth daily.  11  . PRILOSEC OTC 20 MG tablet Take 20 mg by mouth daily.  0   No current facility-administered medications for this visit.    Allergies:   Sulfa antibiotics    Social History:  The patient  reports that he has never smoked. He does not have any smokeless tobacco history on file.   Family History:  The patient's family history includes Brain cancer in his sister.    ROS:  Please see the history of present illness.   Otherwise, review of systems are positive for none.   All other systems are reviewed and negative.    PHYSICAL EXAM: VS:  BP 102/68 mmHg  Pulse 80  Ht 6' (1.829 m)  Wt 220 lb 12.8 oz (100.154 kg)  BMI 29.94 kg/m2 , BMI Body mass index is 29.94 kg/(m^2). GEN: Well nourished, well developed, in no acute distress HEENT: normal Neck: no JVD, carotid bruits, or masses Cardiac: RRR; , rubs, or gallops,no edema .soft systolic murmur at base Respiratory:  clear to auscultation bilaterally, normal work of breathing GI: soft, nontender, nondistended, + BS MS: no deformity or atrophy Skin: warm and dry, no rash Neuro:  Strength and sensation are intact Psych: euthymic mood, full affect   EKG:  EKG is not ordered today.    Recent Labs: 05/31/2015: ALT 32; BUN 18; Creatinine, Ser 1.15; Hemoglobin 11.9*; Platelets 159.0; Potassium 4.5; Sodium 133*; TSH 1.15    Lipid Panel No results found for: CHOL, TRIG, HDL, CHOLHDL, VLDL, LDLCALC, LDLDIRECT    Wt Readings from Last 3 Encounters:  01/17/16 220 lb 12.8 oz (100.154 kg)  08/28/15 216 lb (97.977 kg)  05/31/15 213 lb 1.9 oz (96.671 kg)        ASSESSMENT AND PLAN:  1. Sick sinus syndrome 2. Functioning dual-chamber [employer] pacemaker implanted 09/23/12.now followed by Dr. Lovena Le 3. Systolic heart murmur.echocardiogram on 06/06/15 shows normal left ventricular systolic function with grade 1 diastolic  dysfunction and with mild mitral regurgitation. 4. Prior pulmonary embolus following back surgery several years ago, complicated also by mini stroke with resultant memory loss. 5. Chronic amiodarone therapy. 6. Questionable past history of runs of atrial fibrillation seen on previous interrogation of his pacemaker prior to moving to [place]. Currently on baby aspirin. His chadsvasc score would be 4 based on his age and prior history of mini stroke. . 7. History of nodular lymphoma, treated with chemotherapy in 2008, currently in remission 8. Memory disorder 9. Chronic back pain related to prior pelvic fractures and to  subsequent nerve damage.   Current medicines are reviewed at length with the patient today.  The patient does not have concerns regarding medicines.  The following changes have been made:  no change  Labs/ tests ordered today include:   Orders Placed This Encounter  Procedures  . CBC with Differential/Platelet  . TSH    Disposition: We are checking CBC and TSH today.  He will return in 6 months for office visit EKG CBC TSH hepatic function panel and basal metabolic panel.  Follow-up visit will be with Dr. Meda Coffee  Signed, Darlin Coco MD 01/17/2016 12:32 PM    [facility] [address], [place], [place]  [address] Phone: [phone]; Fax: [phone]

## 2016-01-17 NOTE — Patient Instructions (Addendum)
Medication Instructions:  Your physician recommends that you continue on your current medications as directed. Please refer to the Current Medication list given to you today.  Labwork: tsh/cbc  Testing/Procedures: NONE  Follow-Up: Your physician wants you to follow-up in: 6 months ov/ekg/cbc/tsh/hfp/bmet with Dr Johann Capers will receive a reminder letter in the mail two months in advance. If you don't receive a letter, please call our office to schedule the follow-up appointment.  If you need a refill on your cardiac medications before your next appointment, please call your pharmacy.

## 2016-01-18 LAB — TSH: TSH: 1.49 u[IU]/mL (ref 0.350–4.500)

## 2016-01-18 NOTE — Progress Notes (Signed)
Quick Note:  Please report to patient. The recent labs are stable. Continue same medication and careful diet. ______ 

## 2016-02-04 ENCOUNTER — Other Ambulatory Visit: Payer: Self-pay | Admitting: Cardiology

## 2016-02-08 ENCOUNTER — Other Ambulatory Visit: Payer: Self-pay | Admitting: Cardiology

## 2016-02-26 ENCOUNTER — Ambulatory Visit (INDEPENDENT_AMBULATORY_CARE_PROVIDER_SITE_OTHER): Payer: Medicare Other | Admitting: *Deleted

## 2016-02-26 DIAGNOSIS — I495 Sick sinus syndrome: Secondary | ICD-10-CM | POA: Diagnosis not present

## 2016-02-26 NOTE — Progress Notes (Signed)
Remote pacemaker transmission.   

## 2016-03-03 DIAGNOSIS — H401114 Primary open-angle glaucoma, right eye, indeterminate stage: Secondary | ICD-10-CM | POA: Insufficient documentation

## 2016-03-03 DIAGNOSIS — H26493 Other secondary cataract, bilateral: Secondary | ICD-10-CM | POA: Insufficient documentation

## 2016-03-03 DIAGNOSIS — H0102B Squamous blepharitis left eye, upper and lower eyelids: Secondary | ICD-10-CM

## 2016-03-03 DIAGNOSIS — H04123 Dry eye syndrome of bilateral lacrimal glands: Secondary | ICD-10-CM | POA: Insufficient documentation

## 2016-03-03 DIAGNOSIS — H0102A Squamous blepharitis right eye, upper and lower eyelids: Secondary | ICD-10-CM | POA: Insufficient documentation

## 2016-03-24 ENCOUNTER — Telehealth: Payer: Self-pay | Admitting: Cardiology

## 2016-03-24 NOTE — Telephone Encounter (Signed)
New message      Request for surgical clearance:  What type of surgery is being performed? Radiofrequency ablation When is this surgery scheduled? 04-03-16 Are there any medications that need to be held prior to surgery and how long? Need cardiac clearance and hold aspirin clearance.  Need rep from [employer] to help  Name of physician performing surgery? Dr Normajean Glasgow 1. What is your office phone and fax number?  fax [phone]

## 2016-03-24 NOTE — Telephone Encounter (Signed)
Left message for Jacob Warner with receptionist.  Advised receptionist that typically we receive a pre-op device clearance form to fill out and give contact information for industry rep if necessary.  Gave device clinic fax number for form and device clinic phone number for questions/concerns.

## 2016-03-24 NOTE — Telephone Encounter (Signed)
Faxed to number listed 

## 2016-03-24 NOTE — Telephone Encounter (Signed)
To Dr Mina Marble,   Currently no contraindication for the patient to undergo orthopedic surgery. He should continue his medicine in the perioperative period, he can hold his aspirin week prior to the surgery and restart as soon as acceptable from surgical standpoint.  Please call us with any questions.  Sincerely,  Ena Dawley Cardiologist

## 2016-03-26 DIAGNOSIS — M5412 Radiculopathy, cervical region: Secondary | ICD-10-CM | POA: Diagnosis not present

## 2016-04-01 ENCOUNTER — Encounter: Payer: Self-pay | Admitting: Internal Medicine

## 2016-04-01 ENCOUNTER — Ambulatory Visit (HOSPITAL_BASED_OUTPATIENT_CLINIC_OR_DEPARTMENT_OTHER): Payer: Medicare Other | Admitting: Internal Medicine

## 2016-04-01 ENCOUNTER — Other Ambulatory Visit (HOSPITAL_BASED_OUTPATIENT_CLINIC_OR_DEPARTMENT_OTHER): Payer: Medicare Other

## 2016-04-01 ENCOUNTER — Telehealth: Payer: Self-pay | Admitting: Internal Medicine

## 2016-04-01 VITALS — BP 145/59 | HR 77 | Temp 97.6°F | Resp 18 | Ht 72.0 in | Wt 219.6 lb

## 2016-04-01 DIAGNOSIS — C8290 Follicular lymphoma, unspecified, unspecified site: Secondary | ICD-10-CM

## 2016-04-01 DIAGNOSIS — C8203 Follicular lymphoma grade I, intra-abdominal lymph nodes: Secondary | ICD-10-CM

## 2016-04-01 LAB — CBC WITH DIFFERENTIAL/PLATELET
BASO%: 0.2 % (ref 0.0–2.0)
Basophils Absolute: 0 10*3/uL (ref 0.0–0.1)
EOS%: 0.3 % (ref 0.0–7.0)
Eosinophils Absolute: 0 10*3/uL (ref 0.0–0.5)
HCT: 35.5 % — ABNORMAL LOW (ref 38.4–49.9)
HGB: 12 g/dL — ABNORMAL LOW (ref 13.0–17.1)
LYMPH%: 8.3 % — ABNORMAL LOW (ref 14.0–49.0)
MCH: 32.9 pg (ref 27.2–33.4)
MCHC: 33.9 g/dL (ref 32.0–36.0)
MCV: 96.9 fL (ref 79.3–98.0)
MONO#: 0.3 10*3/uL (ref 0.1–0.9)
MONO%: 3.2 % (ref 0.0–14.0)
NEUT#: 7.1 10*3/uL — ABNORMAL HIGH (ref 1.5–6.5)
NEUT%: 88 % — ABNORMAL HIGH (ref 39.0–75.0)
Platelets: 216 10*3/uL (ref 140–400)
RBC: 3.66 10*6/uL — ABNORMAL LOW (ref 4.20–5.82)
RDW: 13.2 % (ref 11.0–14.6)
WBC: 8.1 10*3/uL (ref 4.0–10.3)
lymph#: 0.7 10*3/uL — ABNORMAL LOW (ref 0.9–3.3)

## 2016-04-01 LAB — LACTATE DEHYDROGENASE: LDH: 189 U/L (ref 125–245)

## 2016-04-01 LAB — COMPREHENSIVE METABOLIC PANEL
ALT: 23 U/L (ref 0–55)
AST: 20 U/L (ref 5–34)
Albumin: 3.6 g/dL (ref 3.5–5.0)
Alkaline Phosphatase: 37 U/L — ABNORMAL LOW (ref 40–150)
Anion Gap: 7 mEq/L (ref 3–11)
BUN: 27.3 mg/dL — ABNORMAL HIGH (ref 7.0–26.0)
CO2: 31 mEq/L — ABNORMAL HIGH (ref 22–29)
Calcium: 9.6 mg/dL (ref 8.4–10.4)
Chloride: 101 mEq/L (ref 98–109)
Creatinine: 1.3 mg/dL (ref 0.7–1.3)
EGFR: 46 mL/min/{1.73_m2} — ABNORMAL LOW (ref >90)
Glucose: 114 mg/dl (ref 70–140)
Potassium: 4.9 mEq/L (ref 3.5–5.1)
Sodium: 140 mEq/L (ref 136–145)
Total Bilirubin: 0.31 mg/dL (ref 0.20–1.20)
Total Protein: 6.4 g/dL (ref 6.4–8.3)

## 2016-04-01 NOTE — Progress Notes (Signed)
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  PRIOR THERAPY: A course of Rituxan, Cytoxan and prednisone between 05/20/2007 through 10/20/2007 under the care of Dr. Rockney Ghee..  CURRENT THERAPY: Observation.  INTERVAL HISTORY: Jacob Warner 82 y.o. male returns to the clinic today for annual follow-up visit accompanied by his wife. The patient is feeling fine today with no specific complaints. He denied having any significant chest pain, shortness of breath, cough or hemoptysis. He has no fever or chills. No nausea or vomiting. He denied having any significant weight loss or night sweats. Today for reevaluation with repeat blood work.  MEDICAL HISTORY: Past Medical History  Diagnosis Date  . Bradycardia   . Cancer ([place])   . TIA (transient ischemic attack)   . A-fib ([place])   . Anemia   . Pelvic fracture ([place])   . Sick sinus syndrome ([place])   . Alopecia   . Right bbb/left ant fasc block   . Glaucoma   . Xeroderma   . Cherry angioma   . GERD (gastroesophageal reflux disease)   . Hypertension     ALLERGIES:  is allergic to sulfa antibiotics and sulfacetamide sodium.  MEDICATIONS:  Current Outpatient Prescriptions  Medication Sig Dispense Refill  . acetaminophen (TYLENOL) 500 MG tablet Take 1,000 mg by mouth daily. Or if needed every 6 hours for pain    . amiodarone (PACERONE) 200 MG tablet TAKE 1 TABLET (200 MG TOTAL) BY MOUTH DAILY. 30 tablet 5  . aspirin 81 MG tablet Take 81 mg by mouth daily.    . Cyanocobalamin (VITAMIN B-12 PO) Take 1 tablet by mouth daily.    . cycloSPORINE (RESTASIS) 0.05 % ophthalmic emulsion     . doxazosin (CARDURA) 4 MG tablet Take 4 mg by mouth at bedtime.    .  dutasteride (AVODART) 0.5 MG capsule Take 0.5 mg by mouth daily.    Marland Kitchen gabapentin (NEURONTIN) 300 MG capsule Take 300 mg by mouth daily.    Marland Kitchen lactulose (CHRONULAC) 10 GM/15ML solution Take 10 g by mouth 2 (two) times daily as needed for mild constipation.   0  . latanoprost (XALATAN) 0.005 % ophthalmic solution PLACE 1 DROP INTO RIGHT EYE AT BEDTIME  11  . LUMIGAN 0.01 % SOLN Place 1 drop into both eyes at bedtime.     . magnesium oxide (MAG-OX) 400 (241.3 MG) MG tablet Take 1 tablet by mouth 2 (two) times daily.  2  . predniSONE (DELTASONE) 20 MG tablet TAKE 3 TABS A DAY FOR 3 DAYS, THEN 2 TABS A DAY FOR 2 DAYS, THEN 1 TAB A DAY FOR 2 DAYS  0  . RESTASIS 0.05 % ophthalmic emulsion PLACE 1 DROP INTO BOTH EYES 2 TIMES DAILY.  11   No current facility-administered medications for this visit.    SURGICAL HISTORY:  Past Surgical History  Procedure Laterality Date  . Pacemaker placement    . Appendectomy    . Exploratory laparotomy    . Cervical spine surgery    . Lithotripsy      REVIEW OF SYSTEMS:  A comprehensive review of systems was negative.   PHYSICAL EXAMINATION: General appearance: alert, cooperative and no distress Head: Normocephalic, without obvious abnormality, atraumatic Neck:  no adenopathy, no JVD, supple, symmetrical, trachea midline and thyroid not enlarged, symmetric, no tenderness/mass/nodules Lymph nodes: Cervical, supraclavicular, and axillary nodes normal. Resp: clear to auscultation bilaterally Back: symmetric, no curvature. ROM normal. No CVA tenderness. Cardio: regular rate and rhythm, S1, S2 normal, no murmur, click, rub or gallop GI: soft, non-tender; bowel sounds normal; no masses,  no organomegaly Extremities: extremities normal, atraumatic, no cyanosis or edema  ECOG PERFORMANCE STATUS: 1 - Symptomatic but completely ambulatory  Blood pressure 145/59, pulse 77, temperature 97.6 F (36.4 C), temperature source Oral, resp. rate 18, height 6' (1.829 m),  weight 219 lb 9.6 oz (99.61 kg), SpO2 97 %.  LABORATORY DATA: Lab Results  Component Value Date   WBC 8.1 04/01/2016   HGB 12.0* 04/01/2016   HCT 35.5* 04/01/2016   MCV 96.9 04/01/2016   PLT 216 04/01/2016      Chemistry      Component Value Date/Time   NA 133* 05/31/2015 1158   NA 137 04/02/2015 1403   K 4.5 05/31/2015 1158   K 4.7 04/02/2015 1403   CL 100 05/31/2015 1158   CO2 28 05/31/2015 1158   CO2 23 04/02/2015 1403   BUN 18 05/31/2015 1158   BUN 20.0 04/02/2015 1403   CREATININE 1.15 05/31/2015 1158   CREATININE 1.2 04/02/2015 1403      Component Value Date/Time   CALCIUM 8.8 05/31/2015 1158   CALCIUM 8.8 04/02/2015 1403   ALKPHOS 31* 05/31/2015 1158   ALKPHOS 40 04/02/2015 1403   AST 25 05/31/2015 1158   AST 23 04/02/2015 1403   ALT 32 05/31/2015 1158   ALT 31 04/02/2015 1403   BILITOT 0.5 05/31/2015 1158   BILITOT 0.50 04/02/2015 1403       RADIOGRAPHIC STUDIES: No results found.  ASSESSMENT AND PLAN: This is a very pleasant 82 years old white male with history of follicular lymphoma diagnosed in May 2008 status post systemic chemotherapy with Rituxan, Cytoxan and prednisone completed in October 2008. The patient has been on observation for the last 9 years with no evidence for disease recurrence. His CBC today is unremarkable except for mild anemia. Comprehensive metabolic panel and LDH are still pending. I discussed the lab result with the patient and his wife today. I recommended for him to continue on observation with repeat CBC, comprehensive metabolic panel and LDH in one year. He was advised to call immediately if he has any concerning symptoms in the interval. The patient voices understanding of current disease status and treatment options and is in agreement with the current care plan.  All questions were answered. The patient knows to call the clinic with any problems, questions or concerns. We can certainly see the patient much sooner if  necessary.  Disclaimer: This note was dictated with voice recognition software. Similar sounding words can inadvertently be transcribed and may not be corrected upon review.

## 2016-04-01 NOTE — Telephone Encounter (Signed)
Gave and printed appt shced and avs for pt for Apri 2018

## 2016-05-27 ENCOUNTER — Ambulatory Visit (INDEPENDENT_AMBULATORY_CARE_PROVIDER_SITE_OTHER): Payer: Medicare Other | Admitting: *Deleted

## 2016-05-27 DIAGNOSIS — I495 Sick sinus syndrome: Secondary | ICD-10-CM

## 2016-05-28 NOTE — Progress Notes (Signed)
Remote pacemaker transmission.   

## 2016-06-04 LAB — CUP PACEART REMOTE DEVICE CHECK
Battery Remaining Longevity: 48 mo
Battery Remaining Percentage: 74 %
Brady Statistic RA Percent Paced: 100 %
Brady Statistic RV Percent Paced: 100 %
Date Time Interrogation Session: 20170228060700
Implantable Lead Implant Date: 20130926
Implantable Lead Implant Date: 20130926
Implantable Lead Location: 753859
Implantable Lead Location: 753860
Implantable Lead Model: 4456
Implantable Lead Model: 4476
Implantable Lead Serial Number: 473023
Implantable Lead Serial Number: 523746
Lead Channel Impedance Value: 380 Ohm
Lead Channel Impedance Value: 447 Ohm
Lead Channel Pacing Threshold Amplitude: 0.7 V
Lead Channel Pacing Threshold Pulse Width: 0.4 ms
Lead Channel Setting Pacing Amplitude: 2 V
Lead Channel Setting Pacing Amplitude: 2.4 V
Lead Channel Setting Pacing Pulse Width: 0.4 ms
Lead Channel Setting Sensing Sensitivity: 2.5 mV
Pulse Gen Serial Number: 138586

## 2016-06-12 LAB — CUP PACEART REMOTE DEVICE CHECK
Battery Remaining Longevity: 42 mo
Battery Remaining Percentage: 71 %
Brady Statistic RA Percent Paced: 100 %
Brady Statistic RV Percent Paced: 100 %
Date Time Interrogation Session: 20170530044100
Implantable Lead Implant Date: 20130926
Implantable Lead Implant Date: 20130926
Implantable Lead Location: 753859
Implantable Lead Location: 753860
Implantable Lead Model: 4456
Implantable Lead Model: 4476
Implantable Lead Serial Number: 473023
Implantable Lead Serial Number: 523746
Lead Channel Impedance Value: 415 Ohm
Lead Channel Impedance Value: 467 Ohm
Lead Channel Pacing Threshold Amplitude: 0.7 V
Lead Channel Pacing Threshold Pulse Width: 0.4 ms
Lead Channel Setting Pacing Amplitude: 2 V
Lead Channel Setting Pacing Amplitude: 2.4 V
Lead Channel Setting Pacing Pulse Width: 0.4 ms
Lead Channel Setting Sensing Sensitivity: 2.5 mV
Pulse Gen Serial Number: 138586

## 2016-06-17 ENCOUNTER — Encounter: Payer: Self-pay | Admitting: Cardiology

## 2016-06-18 ENCOUNTER — Telehealth: Payer: Self-pay | Admitting: *Deleted

## 2016-06-18 ENCOUNTER — Other Ambulatory Visit: Payer: Self-pay | Admitting: Medical Oncology

## 2016-06-18 ENCOUNTER — Telehealth: Payer: Self-pay | Admitting: Internal Medicine

## 2016-06-18 DIAGNOSIS — C8203 Follicular lymphoma grade I, intra-abdominal lymph nodes: Secondary | ICD-10-CM

## 2016-06-18 NOTE — Telephone Encounter (Signed)
Received call from pt's wife stating pt is having pain & wondering if he can see Dr Julien Nordmann today or soon.  She reports that pt is bloated & having lower abd pain that is constant & described as discomfort, not sharp.  Bowels are moving 3-4 x/d & stools are "stringy", not diarrhea.  He denies fever or bleeding.  Stools vary in color from dark to light but not unusual.  She is concerned b/c he had trouble like this once before with diagnosis.  Message to Dr Mohamed/Pod RN.  Call back # [phone].

## 2016-06-18 NOTE — Telephone Encounter (Signed)
spoke w/ pt confirmed 6/23 lab apt radiology will contact for ct

## 2016-06-18 NOTE — Telephone Encounter (Signed)
Called pt's wife back & informed that labs & stat CT ordered & she should hear from someone soon.

## 2016-06-20 ENCOUNTER — Other Ambulatory Visit (HOSPITAL_BASED_OUTPATIENT_CLINIC_OR_DEPARTMENT_OTHER): Payer: Medicare Other

## 2016-06-20 DIAGNOSIS — C8203 Follicular lymphoma grade I, intra-abdominal lymph nodes: Secondary | ICD-10-CM | POA: Diagnosis not present

## 2016-06-20 LAB — LACTATE DEHYDROGENASE: LDH: 189 U/L (ref 125–245)

## 2016-06-20 LAB — CBC WITH DIFFERENTIAL/PLATELET
BASO%: 0.6 % (ref 0.0–2.0)
Basophils Absolute: 0 10*3/uL (ref 0.0–0.1)
EOS%: 2.8 % (ref 0.0–7.0)
Eosinophils Absolute: 0.1 10*3/uL (ref 0.0–0.5)
HCT: 33.6 % — ABNORMAL LOW (ref 38.4–49.9)
HGB: 11.4 g/dL — ABNORMAL LOW (ref 13.0–17.1)
LYMPH%: 21.2 % (ref 14.0–49.0)
MCH: 32.8 pg (ref 27.2–33.4)
MCHC: 33.9 g/dL (ref 32.0–36.0)
MCV: 96.6 fL (ref 79.3–98.0)
MONO#: 0.4 10*3/uL (ref 0.1–0.9)
MONO%: 9.2 % (ref 0.0–14.0)
NEUT#: 3.1 10*3/uL (ref 1.5–6.5)
NEUT%: 66.2 % (ref 39.0–75.0)
Platelets: 160 10*3/uL (ref 140–400)
RBC: 3.48 10*6/uL — ABNORMAL LOW (ref 4.20–5.82)
RDW: 13.5 % (ref 11.0–14.6)
WBC: 4.7 10*3/uL (ref 4.0–10.3)
lymph#: 1 10*3/uL (ref 0.9–3.3)

## 2016-06-20 LAB — COMPREHENSIVE METABOLIC PANEL
ALT: 27 U/L (ref 0–55)
AST: 22 U/L (ref 5–34)
Albumin: 3.8 g/dL (ref 3.5–5.0)
Alkaline Phosphatase: 33 U/L — ABNORMAL LOW (ref 40–150)
Anion Gap: 7 mEq/L (ref 3–11)
BUN: 19.4 mg/dL (ref 7.0–26.0)
CO2: 27 mEq/L (ref 22–29)
Calcium: 9.2 mg/dL (ref 8.4–10.4)
Chloride: 103 mEq/L (ref 98–109)
Creatinine: 1.2 mg/dL (ref 0.7–1.3)
EGFR: 53 mL/min/{1.73_m2} — ABNORMAL LOW (ref >90)
Glucose: 90 mg/dl (ref 70–140)
Potassium: 5 mEq/L (ref 3.5–5.1)
Sodium: 137 mEq/L (ref 136–145)
Total Bilirubin: 0.58 mg/dL (ref 0.20–1.20)
Total Protein: 6.2 g/dL — ABNORMAL LOW (ref 6.4–8.3)

## 2016-06-26 ENCOUNTER — Ambulatory Visit (HOSPITAL_COMMUNITY)
Admission: RE | Admit: 2016-06-26 | Discharge: 2016-06-26 | Disposition: A | Discharge status: Home / Self Care | Payer: Medicare Other | Source: Ambulatory Visit | Attending: Internal Medicine | Admitting: Internal Medicine

## 2016-06-26 ENCOUNTER — Encounter (HOSPITAL_COMMUNITY): Payer: Self-pay

## 2016-06-26 DIAGNOSIS — N261 Atrophy of kidney (terminal): Secondary | ICD-10-CM | POA: Insufficient documentation

## 2016-06-26 DIAGNOSIS — I700 Atherosclerosis of aorta: Secondary | ICD-10-CM | POA: Diagnosis not present

## 2016-06-26 DIAGNOSIS — C8203 Follicular lymphoma grade I, intra-abdominal lymph nodes: Secondary | ICD-10-CM | POA: Diagnosis present

## 2016-06-26 DIAGNOSIS — M4856XA Collapsed vertebra, not elsewhere classified, lumbar region, initial encounter for fracture: Secondary | ICD-10-CM | POA: Diagnosis not present

## 2016-06-26 MED ORDER — IOPAMIDOL (ISOVUE-300) INJECTION 61%
100.0000 mL | Freq: Once | INTRAVENOUS | Status: AC | PRN
  Administered 2016-06-26: 100 mL via INTRAVENOUS

## 2016-07-08 ENCOUNTER — Telehealth: Payer: Self-pay | Admitting: Medical Oncology

## 2016-07-08 DIAGNOSIS — C8203 Follicular lymphoma grade I, intra-abdominal lymph nodes: Secondary | ICD-10-CM

## 2016-07-08 NOTE — Telephone Encounter (Signed)
Per Julien Nordmann , "Nothing Concerning for recurrent lymphoma. Follow in 6 months with CBC, CMET and LDH " -wife notified.

## 2016-07-08 NOTE — Addendum Note (Signed)
Addended by: Ardeen Garland on: 07/08/2016 03:29 PM   Modules accepted: Orders

## 2016-07-08 NOTE — Telephone Encounter (Signed)
Wife called and has not heard result from CT scan in June. Note to [place].

## 2016-07-10 ENCOUNTER — Telehealth: Payer: Self-pay | Admitting: Internal Medicine

## 2016-07-10 NOTE — Telephone Encounter (Signed)
Spoke with pt wife to confirm 6 mo follow up appt date/time per pof

## 2016-07-29 ENCOUNTER — Other Ambulatory Visit: Payer: Self-pay

## 2016-07-30 MED ORDER — AMIODARONE HCL 200 MG PO TABS: ORAL_TABLET | ORAL | 5 refills | Status: DC

## 2016-07-30 NOTE — Telephone Encounter (Signed)
Ok to fill. Thanks

## 2016-08-12 ENCOUNTER — Encounter: Payer: Self-pay | Admitting: Internal Medicine

## 2016-08-12 ENCOUNTER — Ambulatory Visit (INDEPENDENT_AMBULATORY_CARE_PROVIDER_SITE_OTHER): Payer: Medicare Other | Admitting: Internal Medicine

## 2016-08-12 DIAGNOSIS — I495 Sick sinus syndrome: Secondary | ICD-10-CM | POA: Diagnosis not present

## 2016-08-12 LAB — CUP PACEART INCLINIC DEVICE CHECK
Brady Statistic RA Percent Paced: 99 %
Brady Statistic RV Percent Paced: 100 %
Date Time Interrogation Session: 20170815040000
Implantable Lead Implant Date: 20130926
Implantable Lead Implant Date: 20130926
Implantable Lead Location: 753859
Implantable Lead Location: 753860
Implantable Lead Model: 4456
Implantable Lead Model: 4476
Implantable Lead Serial Number: 473023
Implantable Lead Serial Number: 523746
Lead Channel Impedance Value: 384 Ohm
Lead Channel Impedance Value: 459 Ohm
Lead Channel Pacing Threshold Amplitude: 0.6 V
Lead Channel Pacing Threshold Amplitude: 0.8 V
Lead Channel Pacing Threshold Pulse Width: 0.4 ms
Lead Channel Pacing Threshold Pulse Width: 0.4 ms
Lead Channel Sensing Intrinsic Amplitude: 1.7 mV
Lead Channel Sensing Intrinsic Amplitude: 11.1 mV
Lead Channel Setting Pacing Amplitude: 1.3 V
Lead Channel Setting Pacing Amplitude: 2 V
Lead Channel Setting Pacing Pulse Width: 0.4 ms
Lead Channel Setting Sensing Sensitivity: 2.5 mV
Pulse Gen Serial Number: 138586

## 2016-08-12 NOTE — Patient Instructions (Signed)
Medication Instructions:  Your physician recommends that you continue on your current medications as directed. Please refer to the Current Medication list given to you today.   Labwork: None ordered   Testing/Procedures: None ordered   Follow-Up: Remote monitoring is used to monitor your Pacemaker from home. This monitoring reduces the number of office visits required to check your device to one time per year. It allows us to keep an eye on the functioning of your device to ensure it is working properly. You are scheduled for a device check from home on 11/11/16. You may send your transmission at any time that day. If you have a wireless device, the transmission will be sent automatically. After your physician reviews your transmission, you will receive a postcard with your next transmission date.  Your physician wants you to follow-up in: 12 months with Dr Taylor You will receive a reminder letter in the mail two months in advance. If you don't receive a letter, please call our office to schedule the follow-up appointment.     Any Other Special Instructions Will Be Listed Below (If Applicable).     If you need a refill on your cardiac medications before your next appointment, please call your pharmacy.   

## 2016-08-12 NOTE — Progress Notes (Signed)
HPI Jacob Warner returns today for followup of his PPM. He is a pleasant 82 yo man with symptomatic bradycardia and PAF, s/p PPM insertion. In the interim he has been stable. 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 No syncope. He is limited by pain in the back and legs. Allergies  Allergen Reactions  . Sulfa Antibiotics Hives and Rash    Bactrim  . Sulfacetamide Sodium Rash and Hives    Bactrim     Current Outpatient Prescriptions  Medication Sig Dispense Refill  . acetaminophen (TYLENOL) 500 MG tablet Take 1,000 mg by mouth every 6 (six) hours as needed (pain).     Marland Kitchen amiodarone (PACERONE) 200 MG tablet TAKE 1 TABLET (200 MG TOTAL) BY MOUTH DAILY. 30 tablet 5  . aspirin 81 MG tablet Take 81 mg by mouth daily.    . Cyanocobalamin (VITAMIN B-12 PO) Take 1 tablet by mouth daily.    Marland Kitchen doxazosin (CARDURA) 4 MG tablet Take 4 mg by mouth at bedtime.    . dutasteride (AVODART) 0.5 MG capsule Take 0.5 mg by mouth daily.    Marland Kitchen gabapentin (NEURONTIN) 300 MG capsule Take 300 mg by mouth daily.    Marland Kitchen lactulose (CHRONULAC) 10 GM/15ML solution Take 10 g by mouth 2 (two) times daily as needed for mild constipation.   0  . latanoprost (XALATAN) 0.005 % ophthalmic solution PLACE 1 DROP INTO RIGHT EYE AT BEDTIME  11  . LUMIGAN 0.01 % SOLN Place 1 drop into both eyes at bedtime.     . magnesium oxide (MAG-OX) 400 (241.3 MG) MG tablet Take 1 tablet by mouth 2 (two) times daily.  2  . RESTASIS 0.05 % ophthalmic emulsion PLACE 1 DROP INTO BOTH EYES 2 TIMES DAILY.  11  . Vitamin D, Ergocalciferol, (DRISDOL) 50000 units CAPS capsule Take 1 capsule by mouth once a week. Takes on Tuesdays     No current facility-administered medications for this visit.      Past Medical History:  Diagnosis Date  . A-fib ([place])   . Alopecia   . Anemia   . Bradycardia   . Cancer ([place])   . Cherry angioma   . GERD (gastroesophageal reflux disease)   . Glaucoma   .  Hypertension   . Pelvic fracture ([place])   . Right bbb/left ant fasc block   . Sick sinus syndrome ([place])   . TIA (transient ischemic attack)   . Xeroderma     ROS:   All systems reviewed and negative except as noted in the HPI.   Past Surgical History:  Procedure Laterality Date  . APPENDECTOMY    . CERVICAL SPINE SURGERY    . EXPLORATORY LAPAROTOMY    . LITHOTRIPSY    . PACEMAKER PLACEMENT       Family History  Problem Relation Age of Onset  . Brain cancer Sister      Social History   Social History  . Marital status: Married    Spouse name: N/A  . Number of children: N/A  . Years of education: N/A   Occupational History  . Not on file.   Social History Main Topics  . Smoking status: Never Smoker  . Smokeless tobacco: Not on file  . Alcohol use No  . Drug use: No  . Sexual activity: Not on file   Other Topics Concern  . Not on file   Social History Narrative  .  No narrative on file     BP 120/76   Pulse 71   Ht 5\' 10"  (1.778 m)   Wt 216 lb 6.4 oz (98.2 kg)   SpO2 94%   BMI 31.05 kg/m   Physical Exam:  Well appearing elderly man, NAD HEENT: Unremarkable Neck:  6 cm JVD, no thyromegally Back:  No CVA tenderness Lungs:  Clear with no wheezes HEART:  Regular rate rhythm, no murmurs, no rubs, no clicks Abd:  soft, positive bowel sounds, no organomegally, no rebound, no guarding Ext:  2 plus pulses, no edema, no cyanosis, no clubbing Skin:  No rashes no nodules Neuro:  CN II through XII intact, motor grossly intact   DEVICE  Normal device function.  See PaceArt for details.   Assess/Plan:  1. Sinus node dysfunction - he is stable s/p PPM 2. HTN - his blood pressure is controlled. Will follow. 3. PPM - his [employer] DDD PM is working normally. Will recheck in several months.  Jacob Warner.D.

## 2016-09-05 ENCOUNTER — Encounter: Payer: Self-pay | Admitting: Cardiology

## 2016-09-19 ENCOUNTER — Ambulatory Visit (INDEPENDENT_AMBULATORY_CARE_PROVIDER_SITE_OTHER): Payer: Medicare Other | Admitting: Cardiology

## 2016-09-19 ENCOUNTER — Encounter (INDEPENDENT_AMBULATORY_CARE_PROVIDER_SITE_OTHER): Payer: Self-pay

## 2016-09-19 ENCOUNTER — Encounter: Payer: Self-pay | Admitting: Cardiology

## 2016-09-19 VITALS — BP 102/70 | HR 75 | Ht 70.5 in | Wt 214.4 lb

## 2016-09-19 DIAGNOSIS — I495 Sick sinus syndrome: Secondary | ICD-10-CM | POA: Diagnosis not present

## 2016-09-19 DIAGNOSIS — Z79899 Other long term (current) drug therapy: Secondary | ICD-10-CM | POA: Diagnosis not present

## 2016-09-19 DIAGNOSIS — R001 Bradycardia, unspecified: Secondary | ICD-10-CM

## 2016-09-19 DIAGNOSIS — Z950 Presence of cardiac pacemaker: Secondary | ICD-10-CM | POA: Diagnosis not present

## 2016-09-19 NOTE — Patient Instructions (Signed)

## 2016-09-19 NOTE — Progress Notes (Signed)
Cardiology Office Note    Date:  09/19/2016   ID:  Jacob Warner, Jacob Warner 02-24-1924, MRN [id number]  PCP:  Jonathon Bellows, MD  Cardiologist: Ena Dawley, MD   Chief complaint: 6 months follow-up  History of Present Illness:  Jacob Warner is a 82 y.o. male who moved here from [place] in February 2016. He moved here with his wife in order to live closer to his 2 sons who live in this area.  His PCP is  Dr. [name]. The patient has a past history of sick sinus syndrome. He presented to his PCP in 2013 with a heart rate of 39. He was having episodes of dizziness. He has not had any history of syncope. On 09/23/12 he underwent implantation of a [employer]ater[employer]. The site of implantation is in the right upper chest because he has the remnants of a previous Port-A-Cath in the left upper chest. The patient has a past history of having had nodular lymphoma in 2008. This was treated successfully with chemotherapy. He has been in remission. His oncologist here in [place] is Dr. Julien Nordmann.  He has a past history of back surgery following which he had complications of a pulmonary embolus and a mini stroke. The mini stroke resulted in some memory deficit. The memory deficit does not appear to be progressive however. The patient walks with a cane. He no longer drives a car. In June 2015 the patient had a mechanical fall and fractured his pelvis in multiple places. He was hospitalized in [place] for one month and then received additional physical therapy at home.he continues to have a lot of back pain. The patient was noted on previous interrogation of his pacemaker to an event runs of nonsustained ventricular tachycardia. There was also a question of paroxysmal atrial fibrillation. He has been on low dose amiodarone. He is on an 81 mg aspirin daily.he had a chest x-ray after his last visit which showed clear lungs and no evidence of  amiodarone toxicity.  Today he is coming for 6 months follow-up, that he states he feels great, able to perform all activities of daily living.His pacemaker was checked by Dr. Lovena Le 1 month ago. Denies any palpitations or syncope. No lower extremity edema, orthopnea or dyspnea on exertion. He has been compliant with his medications. His blood pressure is fairly low but he denies any dizziness or syncope.  Past Medical History:  Diagnosis Date  . A-fib ([place])   . Alopecia   . Anemia   . Bradycardia   . Cancer ([place])   . Cherry angioma   . GERD (gastroesophageal reflux disease)   . Glaucoma   . Hypertension   . Pelvic fracture ([place])   . Right bbb/left ant fasc block   . Sick sinus syndrome ([place])   . TIA (transient ischemic attack)   . Xeroderma     Past Surgical History:  Procedure Laterality Date  . APPENDECTOMY    . CERVICAL SPINE SURGERY    . EXPLORATORY LAPAROTOMY    . LITHOTRIPSY    . PACEMAKER PLACEMENT      Current Medications: Outpatient Medications Prior to Visit  Medication Sig Dispense Refill  . acetaminophen (TYLENOL) 500 MG tablet Take 1,000 mg by mouth every 6 (six) hours as needed (pain).     Marland Kitchen amiodarone (PACERONE) 200 MG tablet TAKE 1 TABLET (200 MG TOTAL) BY MOUTH DAILY. 30 tablet 5  . aspirin 81 MG tablet Take 81 mg  by mouth daily.    . Cyanocobalamin (VITAMIN B-12 PO) Take 1 tablet by mouth daily.    Marland Kitchen doxazosin (CARDURA) 4 MG tablet Take 4 mg by mouth at bedtime.    . dutasteride (AVODART) 0.5 MG capsule Take 0.5 mg by mouth daily.    Marland Kitchen gabapentin (NEURONTIN) 300 MG capsule Take 300 mg by mouth every morning.     . lactulose (CHRONULAC) 10 GM/15ML solution Take 10 g by mouth 2 (two) times daily as needed for mild constipation.   0  . latanoprost (XALATAN) 0.005 % ophthalmic solution PLACE 1 DROP INTO RIGHT EYE AT BEDTIME  11  . LUMIGAN 0.01 % SOLN Place 1 drop into both eyes at bedtime.     . magnesium oxide (MAG-OX) 400 (241.3 MG) MG tablet Take 1 tablet  by mouth 2 (two) times daily.  2  . RESTASIS 0.05 % ophthalmic emulsion PLACE 1 DROP INTO BOTH EYES 2 TIMES DAILY.  11  . Vitamin D, Ergocalciferol, (DRISDOL) 50000 units CAPS capsule Take 1 capsule by mouth once a week. Takes on Tuesdays     No facility-administered medications prior to visit.      Allergies:   Sulfa antibiotics and Sulfacetamide sodium   Social History   Social History  . Marital status: Married    Spouse name: N/A  . Number of children: N/A  . Years of education: N/A   Social History Main Topics  . Smoking status: Never Smoker  . Smokeless tobacco: Never Used  . Alcohol use No  . Drug use: No  . Sexual activity: Not Asked   Other Topics Concern  . None   Social History Narrative  . None     Family History:  The patient's family history includes Brain cancer in his sister.   ROS:   Please see the history of present illness.    ROS All other systems reviewed and are negative.   PHYSICAL EXAM:   VS:  BP 102/70   Pulse 75   Ht 5' 10.5" (1.791 m)   Wt 214 lb 6.4 oz (97.3 kg)   SpO2 93%   BMI 30.33 kg/m    GEN: Well nourished, well developed, in no acute distress  HEENT: normal  Neck: no JVD, carotid bruits, or masses Cardiac: RRR; no murmurs, rubs, or gallops,no edema , pacemaker pocket without inflammation. Respiratory:  clear to auscultation bilaterally, normal work of breathing GI: soft, nontender, nondistended, + BS MS: no deformity or atrophy  Skin: warm and dry, no rash Neuro:  Alert and Oriented x 3, Strength and sensation are intact Psych: euthymic mood, full affect  Wt Readings from Last 3 Encounters:  09/19/16 214 lb 6.4 oz (97.3 kg)  08/12/16 216 lb 6.4 oz (98.2 kg)  04/01/16 219 lb 9.6 oz (99.6 kg)      Studies/Labs Reviewed:   EKG: AV sequential pacing with a heart rate are 75 bpm.  Recent Labs: 01/17/2016: TSH 1.490 06/20/2016: ALT 27; BUN 19.4; Creatinine 1.2; HGB 11.4; Platelets 160; Potassium 5.0; Sodium 137   Lipid  Panel No results found for: CHOL, TRIG, HDL, CHOLHDL, VLDL, LDLCALC, LDLDIRECT     ASSESSMENT:    1. Pacemaker   2. Encounter for long-term current use of medication   3. Bradycardia   4. Sick sinus syndrome (HCC)      PLAN:  In order of problems listed above:   1. Sick sinus syndromeStatus post dual-chamber pacemaker placement that has been intermittently last month is  working well. 2. History of paroxysmal atrial fibrillation on amiodarone, no signs of toxicity. 3. Vascular low blood pressure without any symptoms.   Medication Adjustments/Labs and Tests Ordered: Current medicines are reviewed at length with the patient today.  Concerns regarding medicines are outlined above.  Medication changes, Labs and Tests ordered today are listed in the Patient Instructions below. Patient Instructions  Medication Instructions:   Your physician recommends that you continue on your current medications as directed. Please refer to the Current Medication list given to you today.     Follow-Up:  Your physician wants you to follow-up in: [place] will receive a reminder letter in the mail two months in advance. If you don't receive a letter, please call our office to schedule the follow-up appointment.        If you need a refill on your cardiac medications before your next appointment, please call your pharmacy.      Signed, Ena Dawley, MD  09/19/2016 11:21 PM    [address], [place], [place]  [address] Phone: [phone]; Fax: [phone]

## 2016-10-16 ENCOUNTER — Ambulatory Visit (INDEPENDENT_AMBULATORY_CARE_PROVIDER_SITE_OTHER): Payer: Medicare Other | Admitting: Specialist

## 2016-10-16 DIAGNOSIS — M48061 Spinal stenosis, lumbar region without neurogenic claudication: Secondary | ICD-10-CM | POA: Diagnosis not present

## 2016-10-23 ENCOUNTER — Ambulatory Visit (INDEPENDENT_AMBULATORY_CARE_PROVIDER_SITE_OTHER): Payer: Medicare Other | Admitting: Physical Medicine and Rehabilitation

## 2016-10-23 ENCOUNTER — Encounter (INDEPENDENT_AMBULATORY_CARE_PROVIDER_SITE_OTHER): Payer: Self-pay | Admitting: Physical Medicine and Rehabilitation

## 2016-10-23 VITALS — BP 123/62 | HR 72 | Temp 98.1°F

## 2016-10-23 DIAGNOSIS — M5416 Radiculopathy, lumbar region: Secondary | ICD-10-CM

## 2016-10-23 DIAGNOSIS — M461 Sacroiliitis, not elsewhere classified: Secondary | ICD-10-CM

## 2016-10-23 MED ORDER — METHYLPREDNISOLONE ACETATE 80 MG/ML IJ SUSP
80.0000 mg | Freq: Once | INTRAMUSCULAR | Status: AC
Start: 2016-10-23 — End: 2016-10-23
  Administered 2016-10-23: 80 mg

## 2016-10-23 MED ORDER — LIDOCAINE HCL (PF) 1 % IJ SOLN
0.3300 mL | Freq: Once | INTRAMUSCULAR | Status: AC
  Administered 2016-10-23: 0.3 mL

## 2016-10-23 NOTE — Progress Notes (Signed)
Office Visit Note  Patient: Jacob Warner           Date of Birth: 11-03-1924           MRN: [id number] Visit Date: 10/23/2016              Requested by: [name], MD [address] [place], [place] [address] PCP: Jonathon Bellows, MD   Assessment & Plan: Visit Diagnoses:  1. Lumbar radiculopathy   2. Sacroiliitis [facility])     Follow-Up Instructions: [name] or Benjiman Core, PA has scheduled  Orders:  Orders Placed This Encounter  Procedures  . Epidural Steroid injection    Meds ordered this encounter  Medications  . lidocaine (PF) (XYLOCAINE) 1 % injection 0.3 mL  . methylPREDNISolone acetate (DEPO-MEDROL) injection 80 mg      Procedures: Lumbosacral Transforaminal Epidural Steroid Injection - Infraneural Approach with Fluoroscopic Guidance  Patient: Jacob Warner      Date of Birth: 11/29/1924 MRN: [id number] PCP: Jonathon Bellows, MD      Visit Date: 10/23/2016   Universal Protocol:    Date/Time: 10/26/171:57 PM  Consent Given By: the patient  Position: PRONE   Additional Comments: Vital signs were monitored before and after the procedure. Patient was prepped and draped in the usual sterile fashion. The correct patient, procedure, and site was verified.   Injection Procedure Details:  Procedure Site One Meds Administered:  Meds ordered this encounter  Medications  . lidocaine (PF) (XYLOCAINE) 1 % injection 0.3 mL  . methylPREDNISolone acetate (DEPO-MEDROL) injection 80 mg     Laterality: Left  Location/Site:  L3-L4  Needle size: 22 G  Needle type: Spinal  Needle Placement: Transforaminal  Findings:  -Contrast Used: 1 mL iohexol 180 mg iodine/mL   -Comments: Excellent flow of contrast along the nerve and into the epidural space.  Procedure Details: After squaring off the end-plates of the desired vertebral level to get a true AP view, the C-arm was obliqued to the painful side so that the superior articulating process  is positioned about 1/3 the length of the inferior endplate.  The needle was aimed toward the junction of the superior articular process and the transverse process of the inferior vertebrae. The needle's initial entry is in the lower third of the foramen through Kambin's triangle. The soft tissues overlying this target were infiltrated with 2-3 ml. of 1% Lidocaine without Epinephrine.  The spinal needle was then inserted and advanced toward the target using a "trajectory" view along the fluoroscope beam.  Under AP and lateral visualization, the needle was advanced so it did not puncture dura and did not traverse medially beyond the 6 o'clock position of the pedicle. Bi-planar projections were used to confirm position. Aspiration was confirmed to be negative for CSF and/or blood. A 1-2 ml. volume of Isovue-250 was injected and flow of contrast was noted at each level. Radiographs were obtained for documentation purposes.   After attaining the desired flow of contrast documented above, a 0.5 to 1.0 ml test dose of 0.25% Marcaine was injected into each respective transforaminal space.  The patient was observed for 90 seconds post injection.  After no sensory deficits were reported, and normal lower extremity motor function was noted,   the above injectate was administered so that equal amounts of the injectate were placed at each foramen (level) into the transforaminal epidural space.   Additional Comments:  The patient tolerated the procedure well No complications occurred  Dressing: Band-Aid    Post-procedure details: Patient was observed during the procedure. Post-procedure instructions were reviewed.  Patient left the clinic in stable condition.  Sacroiliac Joint Intra-Articular Injection - Posterior Approach with Fluoroscopic Guidance   Patient: Jacob Warner      Date of Birth: 1924/06/08 MRN: [id number] PCP: Jonathon Bellows, MD      Visit Date: 10/23/2016   Universal Protocol:      Date/Time: 10/26/171:58 PM  Consent Given By: the patient  Position: PRONE  Additional Comments: Vital signs were monitored before and after the procedure. Patient was prepped and draped in the usual sterile fashion. The correct patient, procedure, and site was verified.   Injection Procedure Details:  Procedure Site One Meds Administered:  Meds ordered this encounter  Medications  . lidocaine (PF) (XYLOCAINE) 1 % injection 0.3 mL  . methylPREDNISolone acetate (DEPO-MEDROL) injection 80 mg     Laterality: Left  Location/Site:  Sacroiliac joint  Needle size: 22 G  Needle type: Spinal  Needle Placement: articular  Findings:  -Contrast Used: 1 mL iohexol 180 mg iodine/mL   -Comments: Excellent flow of contrast producing a partial arthrogram.  Procedure Details: Starting with a 90 degree vertical and midline orientation the fluoroscope was tilted cranially 20 to 25 degrees and the target area of the inferior most part of the SI joint on the side mentioned above was visualized.  The soft tissues overlying this target were infiltrated with 4 ml. of 1% Lidocaine without Epinephrine. A #22 gauge spinal needle was inserted perpendicular to the fluoroscope table and advanced into the posterior inferior joint space using fluoroscopic guidance.  Position in the joint space was confirmed by obtaining a partial arthrogram using a 2 ml. volume of Isovue-250 contrast agent. After negative aspirate for gross pus or blood, the injectate was delivered to the joint. Radiographs were obtained for documentation purposes.   Additional Comments:  The patient tolerated the procedure well Dressing: Band-Aid    Post-procedure details: Patient was observed during the procedure. Post-procedure instructions were reviewed.  Patient left the clinic in stable condition.     Clinical Data: No additional findings.   Subjective: Chief Complaint  Patient presents with  . Lower Back - Pain     HPI Relief for 10 days after last injection on 09/09/16 and then gradual increase and worse on left side. Denies leg pain. Pain with walking. Relief with sitting. Taking Advil which gives minimal relief. He was seen by Benjiman Core who suggested repeat injection and sacroiliac joint injection for his pain from a diagnostic and therapeutic standpoint.  Blood thinners- aspirin 81 mg No dye allergy Has driver-wife-Marguerite  Review of Systems   Objective: Vital Signs: BP 123/62 (BP Location: Left Arm, Patient Position: Sitting, Cuff Size: Normal)   Pulse 72   Temp 98.1 F (36.7 C) (Oral)   SpO2 98%    Physical Exam  Ortho Exam General appearance: NAD, conversant  Psych: Appropriate affect, alert and oriented to person, place and time  Eyes: anicteric sclerae, moist conjunctivae; no lid-lag; PERRLA Lungs: normal respiratory effort and no intercostal retractions, no wheezing CVA: normal pulses Extremities: No peripheral edema  Skin: Normal temperature, turgor and texture; no rash, ulcers or subcutaneous nodules MSK:/Neuro:   On manual muscle testing there is 5/5 strength in the distal muscle groups of the lower extremities bilaterally without deficits. There is no clonus test bilaterally.   Specialty Comments:  No specialty comments available. Imaging: No results found.   [place]  History: Patient Active Problem List   Diagnosis Date Noted  . Bradycardia 05/31/2015  . Pacemaker 05/31/2015  . Peripheral edema 05/31/2015  . Heart murmur 05/31/2015  . Sick sinus syndrome ([place]) 05/31/2015  . Nonsustained ventricular tachycardia ([place]) 05/31/2015  . 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 A-fib ([place])   . Alopecia   . Anemia   . Bradycardia   . Cancer ([place])   . Cherry angioma   . GERD (gastroesophageal reflux disease)   . Glaucoma   . Hypertension   . Pelvic fracture ([place])   . Right BBB/left ant fasc block   . Sick sinus syndrome  ([place])   . TIA (transient ischemic attack)   . Xeroderma     Family History  Problem Relation Age of Onset  . Brain cancer Sister    Past Surgical History:  Procedure Laterality Date  . APPENDECTOMY    . CERVICAL SPINE SURGERY    . EXPLORATORY LAPAROTOMY    . LITHOTRIPSY    . PACEMAKER PLACEMENT     Social History   Occupational History  . Not on file.   Social History Main Topics  . Smoking status: Never Smoker  . Smokeless tobacco: Never Used  . Alcohol use No  . Drug use: No  . Sexual activity: Not on file

## 2016-10-23 NOTE — Procedures (Signed)
Lumbosacral Transforaminal Epidural Steroid Injection - Infraneural Approach with Fluoroscopic Guidance  Patient: Jacob Warner      Date of Birth: December 05, 1924 MRN: [id number] PCP: Jonathon Bellows, MD      Visit Date: 10/23/2016   Universal Protocol:    Date/Time: 10/26/171:57 PM  Consent Given By: the patient  Position: PRONE   Additional Comments: Vital signs were monitored before and after the procedure. Patient was prepped and draped in the usual sterile fashion. The correct patient, procedure, and site was verified.   Injection Procedure Details:  Procedure Site One Meds Administered:  Meds ordered this encounter  Medications  . lidocaine (PF) (XYLOCAINE) 1 % injection 0.3 mL  . methylPREDNISolone acetate (DEPO-MEDROL) injection 80 mg     Laterality: Left  Location/Site:  L3-L4  Needle size: 22 G  Needle type: Spinal  Needle Placement: Transforaminal  Findings:  -Contrast Used: 1 mL iohexol 180 mg iodine/mL   -Comments: Excellent flow of contrast along the nerve and into the epidural space.  Procedure Details: After squaring off the end-plates of the desired vertebral level to get a true AP view, the C-arm was obliqued to the painful side so that the superior articulating process is positioned about 1/3 the length of the inferior endplate.  The needle was aimed toward the junction of the superior articular process and the transverse process of the inferior vertebrae. The needle's initial entry is in the lower third of the foramen through Kambin's triangle. The soft tissues overlying this target were infiltrated with 2-3 ml. of 1% Lidocaine without Epinephrine.  The spinal needle was then inserted and advanced toward the target using a "trajectory" view along the fluoroscope beam.  Under AP and lateral visualization, the needle was advanced so it did not puncture dura and did not traverse medially beyond the 6 o'clock position of the pedicle. Bi-planar projections  were used to confirm position. Aspiration was confirmed to be negative for CSF and/or blood. A 1-2 ml. volume of Isovue-250 was injected and flow of contrast was noted at each level. Radiographs were obtained for documentation purposes.   After attaining the desired flow of contrast documented above, a 0.5 to 1.0 ml test dose of 0.25% Marcaine was injected into each respective transforaminal space.  The patient was observed for 90 seconds post injection.  After no sensory deficits were reported, and normal lower extremity motor function was noted,   the above injectate was administered so that equal amounts of the injectate were placed at each foramen (level) into the transforaminal epidural space.   Additional Comments:  The patient tolerated the procedure well No complications occurred Dressing: Band-Aid    Post-procedure details: Patient was observed during the procedure. Post-procedure instructions were reviewed.  Patient left the clinic in stable condition.  Sacroiliac Joint Intra-Articular Injection - Posterior Approach with Fluoroscopic Guidance   Patient: Jacob Warner      Date of Birth: 1924/12/20 MRN: [id number] PCP: Jonathon Bellows, MD      Visit Date: 10/23/2016   Universal Protocol:    Date/Time: 10/26/171:58 PM  Consent Given By: the patient  Position: PRONE  Additional Comments: Vital signs were monitored before and after the procedure. Patient was prepped and draped in the usual sterile fashion. The correct patient, procedure, and site was verified.   Injection Procedure Details:  Procedure Site One Meds Administered:  Meds ordered this encounter  Medications  . lidocaine (PF) (XYLOCAINE) 1 % injection 0.3 mL  . methylPREDNISolone acetate (DEPO-MEDROL)  injection 80 mg     Laterality: Left  Location/Site:  Sacroiliac joint  Needle size: 22 G  Needle type: Spinal  Needle Placement: articular  Findings:  -Contrast Used: 1 mL iohexol 180 mg  iodine/mL   -Comments: Excellent flow of contrast producing a partial arthrogram.  Procedure Details: Starting with a 90 degree vertical and midline orientation the fluoroscope was tilted cranially 20 to 25 degrees and the target area of the inferior most part of the SI joint on the side mentioned above was visualized.  The soft tissues overlying this target were infiltrated with 4 ml. of 1% Lidocaine without Epinephrine. A #22 gauge spinal needle was inserted perpendicular to the fluoroscope table and advanced into the posterior inferior joint space using fluoroscopic guidance.  Position in the joint space was confirmed by obtaining a partial arthrogram using a 2 ml. volume of Isovue-250 contrast agent. After negative aspirate for gross pus or blood, the injectate was delivered to the joint. Radiographs were obtained for documentation purposes.   Additional Comments:  The patient tolerated the procedure well Dressing: Band-Aid    Post-procedure details: Patient was observed during the procedure. Post-procedure instructions were reviewed.  Patient left the clinic in stable condition.

## 2016-11-11 ENCOUNTER — Telehealth: Payer: Self-pay | Admitting: Internal Medicine

## 2016-11-11 ENCOUNTER — Encounter: Payer: Medicare Other | Admitting: *Deleted

## 2016-11-14 ENCOUNTER — Encounter: Payer: Self-pay | Admitting: Cardiology

## 2016-12-03 ENCOUNTER — Telehealth: Payer: Self-pay | Admitting: *Deleted

## 2016-12-03 NOTE — Telephone Encounter (Signed)
[facility] requesting call back.  [facility] phone number for return call.  Will request that patient send a manual transmission from home monitor as it has become disconnected.  Transmission scheduled for 11/11/16 did not come through automatically.

## 2016-12-04 ENCOUNTER — Ambulatory Visit (INDEPENDENT_AMBULATORY_CARE_PROVIDER_SITE_OTHER): Payer: Medicare Other | Admitting: Specialist

## 2016-12-04 ENCOUNTER — Encounter (INDEPENDENT_AMBULATORY_CARE_PROVIDER_SITE_OTHER): Payer: Self-pay | Admitting: Specialist

## 2016-12-04 VITALS — BP 146/74 | HR 70 | Ht 72.0 in | Wt 212.0 lb

## 2016-12-04 DIAGNOSIS — M48062 Spinal stenosis, lumbar region with neurogenic claudication: Secondary | ICD-10-CM | POA: Diagnosis not present

## 2016-12-04 NOTE — Patient Instructions (Signed)
Avoid bending, stooping and avoid lifting weights greater than 10 lbs. Avoid prolong standing and walking. Avoid frequent bending and stooping  No lifting greater than 10 lbs. May use ice or moist heat for pain. Weight loss is of benefit. Handicap license is approved. Dr. Romona Curls secretary/Assistant will call to arrange for consideration of spinal cord stimulator. Has already had RFa without success.

## 2016-12-04 NOTE — Progress Notes (Signed)
Office Visit Note   Patient: Jacob Warner           Date of Birth: [birth year]           MRN: [id number] Visit Date: 12/04/2016              Requested by: [name], MD [address] [place], [place] [address] PCP: Jonathon Bellows, MD   Assessment & Plan: Visit Diagnoses:  1. Spinal stenosis of lumbar region with neurogenic claudication     Plan: Avoid bending, stooping and avoid lifting weights greater than 10 lbs. Avoid prolong standing and walking. Avoid frequent bending and stooping  No lifting greater than 10 lbs. May use ice or moist heat for pain. Weight loss is of benefit. Handicap license is approved. Dr. Romona Curls secretary/Assistant will call to arrange for consideration of spinal cord stimulator. Has already had RFa without success.   Follow-Up Instructions: Return in about 4 weeks (around 01/01/2017).   Orders:  No orders of the defined types were placed in this encounter.  No orders of the defined types were placed in this encounter.     Procedures: No procedures performed   Clinical Data: No additional findings.   Subjective: Chief Complaint  Patient presents with  . Lower Back - Pain, Follow-up    Patient is returning after repeat transforaminal esi injection and left diagnostic therapeutic SI joint injection done on 10/23/2016. States this did help some maybe around 10-15%, however the pain has returned. Pain just in low back. Denies any leg pain.Pain is severe, as high as a 10 of 10 when at its worst, relieved by sitting and with bending over the sink. Leaning to use toothbrush increases the pain. Bowel and bladder are normal .    Review of Systems  HENT: Negative.   Eyes: Negative.   Respiratory: Negative.   Cardiovascular: Negative.   Gastrointestinal: Negative.   Genitourinary: Negative.   Musculoskeletal: Positive for back pain and gait problem.  Skin: Negative.   Neurological: Positive for weakness and numbness.    Hematological: Negative.   Psychiatric/Behavioral: Negative.      Objective: Vital Signs: BP (!) 146/74   Pulse 70   Ht 6' (1.829 m)   Wt 212 lb (96.2 kg)   BMI 28.75 kg/m   Physical Exam  Constitutional: He is oriented to person, place, and time. He appears well-developed and well-nourished.  Eyes: EOM are normal. Pupils are equal, round, and reactive to light.  Neck: Normal range of motion. Neck supple.  Pulmonary/Chest: Effort normal and breath sounds normal.  Abdominal: Soft. Bowel sounds are normal.  Neurological: He is alert and oriented to person, place, and time.  Skin: Skin is warm and dry.    Back Exam   Tenderness  The patient is experiencing tenderness in the lumbar.  Range of Motion  Extension: abnormal  Flexion: abnormal  Lateral Bend Right: normal  Lateral Bend Left: normal  Rotation Right: normal  Rotation Left: normal   Muscle Strength  Right Quadriceps:  5/5  Left Quadriceps:  5/5  Right Hamstrings:  5/5  Left Hamstrings:  5/5   Tests  Straight leg raise right: negative Straight leg raise left: negative  Reflexes  Patellar: Hyporeflexic Achilles: Hyporeflexic Biceps: 0/4 Babinski's sign: normal   Other  Toe Walk: abnormal Sensation: normal Gait: normal  Erythema: no back redness Scars: absent      Specialty Comments:  No specialty comments available.  Imaging: No results found.  PMFS History: Patient Active Problem List   Diagnosis Date Noted  . Bradycardia 05/31/2015  . Pacemaker 05/31/2015  . Peripheral edema 05/31/2015  . Heart murmur 05/31/2015  . Sick sinus syndrome ([place]) 05/31/2015  . Nonsustained ventricular tachycardia ([place]) 05/31/2015  . 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 A-fib ([place])   . Alopecia   . Anemia   . Bradycardia   . Cancer ([place])   . Cherry angioma   . GERD (gastroesophageal reflux disease)   . Glaucoma   . Hypertension   . Pelvic fracture  ([place])   . Right BBB/left ant fasc block   . Sick sinus syndrome ([place])   . TIA (transient ischemic attack)   . Xeroderma     Family History  Problem Relation Age of Onset  . Brain cancer Sister     Past Surgical History:  Procedure Laterality Date  . APPENDECTOMY    . CERVICAL SPINE SURGERY    . EXPLORATORY LAPAROTOMY    . LITHOTRIPSY    . PACEMAKER PLACEMENT     Social History   Occupational History  . Not on file.   Social History Main Topics  . Smoking status: Never Smoker  . Smokeless tobacco: Never Used  . Alcohol use No  . Drug use: No  . Sexual activity: Not on file

## 2016-12-05 NOTE — Telephone Encounter (Signed)
Patient's wife returned call.  She states that they contacted [employer] recently because they could not get his 11/14 transmission to go through.  She reports that they were supposed to order an adapter, but the patient has not received it yet.  Advised that I will contact the [facility] and request that he follow-up and order a cellular adapter if necessary.  Advised patient's wife that he may reach out to the patient via phone if he needs additional details.  Patient's wife is agreeable to this plan and is appreciative of assistance.  She denies additional questions or concerns at this time.

## 2016-12-08 ENCOUNTER — Ambulatory Visit (INDEPENDENT_AMBULATORY_CARE_PROVIDER_SITE_OTHER): Payer: Medicare Other | Admitting: *Deleted

## 2016-12-08 DIAGNOSIS — I495 Sick sinus syndrome: Secondary | ICD-10-CM

## 2016-12-11 NOTE — Telephone Encounter (Signed)
Patient's home monitor transmitted successfully on 12/08/16.

## 2016-12-16 NOTE — Progress Notes (Signed)
Remote pacemaker transmission.   

## 2016-12-17 ENCOUNTER — Encounter: Payer: Self-pay | Admitting: Cardiology

## 2016-12-18 ENCOUNTER — Encounter (INDEPENDENT_AMBULATORY_CARE_PROVIDER_SITE_OTHER): Payer: Self-pay | Admitting: Physical Medicine and Rehabilitation

## 2016-12-18 ENCOUNTER — Ambulatory Visit (INDEPENDENT_AMBULATORY_CARE_PROVIDER_SITE_OTHER): Payer: Medicare Other | Admitting: Physical Medicine and Rehabilitation

## 2016-12-18 ENCOUNTER — Encounter (INDEPENDENT_AMBULATORY_CARE_PROVIDER_SITE_OTHER): Payer: Self-pay

## 2016-12-18 VITALS — BP 147/80 | HR 70

## 2016-12-18 DIAGNOSIS — M5416 Radiculopathy, lumbar region: Secondary | ICD-10-CM

## 2016-12-18 DIAGNOSIS — M961 Postlaminectomy syndrome, not elsewhere classified: Secondary | ICD-10-CM

## 2016-12-18 DIAGNOSIS — G894 Chronic pain syndrome: Secondary | ICD-10-CM | POA: Diagnosis not present

## 2016-12-18 NOTE — Progress Notes (Signed)
Jacob Warner - 82 y.o. male MRN [id number]  Date of birth: [birth year]  Office Visit Note: Visit Date: 12/18/2016 PCP: Jonathon Bellows, MD Referred by: [name], MD  Subjective: Chief Complaint  Patient presents with  . Lower Back - Pain   HPI: Jacob Warner is a pleasant and young appearing 82 year old gentleman who is here with his wife who helps with some of the history. He is a patient of our spine surgeon [name]. I have seen him on several occasions for epidural injections. A brief review of his history is had 2 prior lumbar surgeries but no fusion. The surgeries at least the first one was fairly successful but the second one has never really helped him. He had complications after the second surgery with pulmonary embolus. In terms of his general health he does have a pacemaker is followed by cardiology. The pacemaker is a [employer]. He is not on any blood thinners but he is on a baby aspirin. He has chronic worsening low back pain right worse than left. No leg pain or paresthesias. No focal weakness. The pain radiates from the back into the hips. It's a 10 out of 10 pain with standing and he can't stand for very long at all. Facet joint blocks as well as epidural injections and SI joint injections were not beneficial at all. He may be we get 10-15% relief for a few days and that was it. He has had medication management through [name] this has not been very beneficial either. He would like to be able to stand and walk better. His balance is okay he has not had any falls. He's had no focal weakness. This is recalcitrant low back and right hip pain bilaterally. [name] requested evaluation for possible spinal cord stimulator trial and implant.     Review of Systems  Constitutional: Negative for chills, fever, malaise/fatigue and weight loss.  HENT: Negative for hearing loss and sinus pain.   Eyes: Negative for blurred vision, double vision and photophobia.    Respiratory: Negative for cough and shortness of breath.   Cardiovascular: Negative for chest pain, palpitations and leg swelling.  Gastrointestinal: Negative for abdominal pain, nausea and vomiting.  Genitourinary: Negative for flank pain.  Musculoskeletal: Positive for back pain and joint pain. Negative for myalgias.  Skin: Negative for itching and rash.  Neurological: Negative for tremors, focal weakness and weakness.  Endo/Heme/Allergies: Negative.   Psychiatric/Behavioral: Negative for depression.  All other systems reviewed and are negative.  Otherwise per HPI.  Assessment & Plan: Visit Diagnoses:  1. Post laminectomy syndrome   2. Chronic pain syndrome   3. Lumbar radiculopathy     Plan: Findings:  Chronic persistent recalcitrant severe low back and bilateral right more than left hip pain. He is failed surgical correction as well as medication management and interventional procedures. [name] has ordered and reviewed CT myelogram which is also reviewed below. This is reviewed with the patient once again. He has multilevel facet arthropathy and subarticular or lateral recess stenosis which is quite significant. [name] feels that he is not a surgical candidate due to his age. We did discuss spinal cord stimulators and I did give him some information. We went over at length the pluses and minuses of something such as a stimulator. He has intimate familiarity with electronic devices as he does have a pacemaker. We did discuss that the stimulator and pacemaker can coexist and we have done that before. The  fact that is the same brand or company makes this a little bit easier. In general he is a 82 year old gentleman that is more active and mentally aware that a lot of other 80-year-old patient's. I do think he is someone that could manage a spinal cord stimulator. He might do well with a nonrechargeable unit if it comes down to something working. We have had successful stimulator trial and  implantation patients who are in their 85s. He does want to proceed with the trial. I will need to set up a psychological evaluation for him. This is been somewhat problematic because of finding someone in [place] who will do a preimplantation psychological evaluation. I do feel that other than his age he would be a good candidate for a trial. I think we do need to look at this these days with patients in their 90s after doing fairly well as not denying treatment. This is a treatment of last resort. I spent more than 35 minutes speaking face-to-face with the patient with 50% of the time in counseling.    Meds & Orders: No orders of the defined types were placed in this encounter.  No orders of the defined types were placed in this encounter.   Follow-up: No Follow-up on file.   Procedures: No procedures performed  No notes on file   Clinical History: Lumbar CT myelogram 04/19/2015 IMPRESSION: 1. Superior endplate fracture at L1 was a vacuum disc at T12-L1. 2. Mild to moderate left subarticular and mild left foraminal stenosis at L1-2. 3. Moderate left and mild right subarticular stenosis at L2-3. There is mild foraminal narrowing at this level bilaterally. 4. Moderate subarticular and mild to moderate foraminal stenosis bilaterally at L3-4. 5. Mild subarticular and moderate foraminal stenosis at L4-5 is worse on the right. 6. Advanced facet hypertrophy and spurring leads to mild to moderate right and mild left foraminal stenosis. The central canal is patent.  He reports that he has never smoked. He has never used smokeless tobacco. No results for input(s): HGBA1C, LABURIC in the last 8760 hours.  Objective:  VS:  HT:    WT:   BMI:     BP:(!) 147/80  HR:70bpm  TEMP: ( )  RESP:  Physical Exam  Constitutional: He is oriented to person, place, and time. He appears well-developed and well-nourished. No distress.  HENT:  Head: Normocephalic and atraumatic.  Nose: Nose normal.    Mouth/Throat: Oropharynx is clear and moist.  Eyes: Conjunctivae are normal. Pupils are equal, round, and reactive to light.  Neck: Normal range of motion. Neck supple.  Cardiovascular: Regular rhythm and intact distal pulses.   Pulmonary/Chest: Effort normal and breath sounds normal.  Abdominal: Soft. He exhibits no distension.  Musculoskeletal: He exhibits no deformity.  The patient ambulates with a cane. He is somewhat slow to rise from a seated position but does ambulate fairly well. He does ambulate with a forward flexed spine. He has pain with extension of the spine is very stiff. He has no pain with hip rotation no pain over the greater trochanters and he has good distal strength without any deficits bilaterally.  Neurological: He is alert and oriented to person, place, and time. He displays normal reflexes. No sensory deficit. He exhibits normal muscle tone. Coordination normal.  Skin: Skin is warm. No rash noted.  Psychiatric: He has a normal mood and affect. His behavior is normal.  Nursing note and vitals reviewed.   Ortho Exam Imaging: No results found.  Past Medical/Family/Surgical/Social  History: Medications & Allergies reviewed per EMR Patient Active Problem List   Diagnosis Date Noted  . Bradycardia 05/31/2015  . Pacemaker 05/31/2015  . Peripheral edema 05/31/2015  . Heart murmur 05/31/2015  . Sick sinus syndrome ([place]) 05/31/2015  . Nonsustained ventricular tachycardia ([place]) 05/31/2015  . 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 A-fib ([place])   . Alopecia   . Anemia   . Bradycardia   . Cancer ([place])   . Cherry angioma   . GERD (gastroesophageal reflux disease)   . Glaucoma   . Hypertension   . Pelvic fracture ([place])   . Right BBB/left ant fasc block   . Sick sinus syndrome ([place])   . TIA (transient ischemic attack)   . Xeroderma    Family History  Problem Relation Age of Onset  . Brain cancer Sister    Past Surgical  History:  Procedure Laterality Date  . APPENDECTOMY    . CERVICAL SPINE SURGERY    . EXPLORATORY LAPAROTOMY    . LITHOTRIPSY    . PACEMAKER PLACEMENT     Social History   Occupational History  . Not on file.   Social History Main Topics  . Smoking status: Never Smoker  . Smokeless tobacco: Never Used  . Alcohol use No  . Drug use: No  . Sexual activity: Not on file

## 2016-12-29 DIAGNOSIS — G894 Chronic pain syndrome: Secondary | ICD-10-CM

## 2016-12-29 HISTORY — PX: SPINAL CORD STIMULATOR IMPLANT: SHX2422

## 2016-12-29 HISTORY — DX: Chronic pain syndrome: G89.4

## 2017-01-05 LAB — CUP PACEART REMOTE DEVICE CHECK
Battery Remaining Longevity: 42 mo
Battery Remaining Percentage: 63 %
Brady Statistic RA Percent Paced: 99 %
Brady Statistic RV Percent Paced: 100 %
Date Time Interrogation Session: 20171211191400
Implantable Lead Implant Date: 20130926
Implantable Lead Implant Date: 20130926
Implantable Lead Location: 753859
Implantable Lead Location: 753860
Implantable Lead Model: 4456
Implantable Lead Model: 4476
Implantable Lead Serial Number: 473023
Implantable Lead Serial Number: 523746
Implantable Pulse Generator Implant Date: 20130926
Lead Channel Impedance Value: 406 Ohm
Lead Channel Impedance Value: 442 Ohm
Lead Channel Pacing Threshold Amplitude: 0.6 V
Lead Channel Pacing Threshold Amplitude: 0.8 V
Lead Channel Pacing Threshold Pulse Width: 0.4 ms
Lead Channel Pacing Threshold Pulse Width: 0.4 ms
Lead Channel Setting Pacing Amplitude: 1.3 V
Lead Channel Setting Pacing Amplitude: 2 V
Lead Channel Setting Pacing Pulse Width: 0.4 ms
Lead Channel Setting Sensing Sensitivity: 2.5 mV
Pulse Gen Serial Number: 138586

## 2017-01-06 ENCOUNTER — Other Ambulatory Visit: Payer: Self-pay | Admitting: Medical Oncology

## 2017-01-06 DIAGNOSIS — C8203 Follicular lymphoma grade I, intra-abdominal lymph nodes: Secondary | ICD-10-CM

## 2017-01-07 ENCOUNTER — Encounter: Payer: Self-pay | Admitting: Internal Medicine

## 2017-01-07 ENCOUNTER — Other Ambulatory Visit (HOSPITAL_BASED_OUTPATIENT_CLINIC_OR_DEPARTMENT_OTHER): Payer: Medicare Other

## 2017-01-07 ENCOUNTER — Telehealth: Payer: Self-pay | Admitting: Internal Medicine

## 2017-01-07 ENCOUNTER — Ambulatory Visit (HOSPITAL_BASED_OUTPATIENT_CLINIC_OR_DEPARTMENT_OTHER): Payer: Medicare Other | Admitting: Internal Medicine

## 2017-01-07 VITALS — BP 134/51 | HR 73 | Temp 97.8°F | Resp 18 | Ht 72.0 in | Wt 221.7 lb

## 2017-01-07 DIAGNOSIS — C8203 Follicular lymphoma grade I, intra-abdominal lymph nodes: Secondary | ICD-10-CM

## 2017-01-07 LAB — CBC WITH DIFFERENTIAL/PLATELET
BASO%: 1 % (ref 0.0–2.0)
Basophils Absolute: 0 10*3/uL (ref 0.0–0.1)
EOS%: 3.6 % (ref 0.0–7.0)
Eosinophils Absolute: 0.1 10*3/uL (ref 0.0–0.5)
HCT: 34.1 % — ABNORMAL LOW (ref 38.4–49.9)
HGB: 11.5 g/dL — ABNORMAL LOW (ref 13.0–17.1)
LYMPH%: 20.1 % (ref 14.0–49.0)
MCH: 33.2 pg (ref 27.2–33.4)
MCHC: 33.9 g/dL (ref 32.0–36.0)
MCV: 98 fL (ref 79.3–98.0)
MONO#: 0.4 10*3/uL (ref 0.1–0.9)
MONO%: 8.5 % (ref 0.0–14.0)
NEUT#: 2.8 10*3/uL (ref 1.5–6.5)
NEUT%: 66.8 % (ref 39.0–75.0)
Platelets: 180 10*3/uL (ref 140–400)
RBC: 3.48 10*6/uL — ABNORMAL LOW (ref 4.20–5.82)
RDW: 13.1 % (ref 11.0–14.6)
WBC: 4.2 10*3/uL (ref 4.0–10.3)
lymph#: 0.8 10*3/uL — ABNORMAL LOW (ref 0.9–3.3)

## 2017-01-07 LAB — COMPREHENSIVE METABOLIC PANEL
ALT: 23 U/L (ref 0–55)
AST: 20 U/L (ref 5–34)
Albumin: 3.7 g/dL (ref 3.5–5.0)
Alkaline Phosphatase: 42 U/L (ref 40–150)
Anion Gap: 9 mEq/L (ref 3–11)
BUN: 20.1 mg/dL (ref 7.0–26.0)
CO2: 28 mEq/L (ref 22–29)
Calcium: 9.6 mg/dL (ref 8.4–10.4)
Chloride: 103 mEq/L (ref 98–109)
Creatinine: 1.3 mg/dL (ref 0.7–1.3)
EGFR: 48 mL/min/{1.73_m2} — ABNORMAL LOW (ref >90)
Glucose: 77 mg/dl (ref 70–140)
Potassium: 4.5 mEq/L (ref 3.5–5.1)
Sodium: 139 mEq/L (ref 136–145)
Total Bilirubin: 0.48 mg/dL (ref 0.20–1.20)
Total Protein: 6.3 g/dL — ABNORMAL LOW (ref 6.4–8.3)

## 2017-01-07 NOTE — Progress Notes (Signed)
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  PRIOR THERAPY: A course of Rituxan, Cytoxan and prednisone between 05/20/2007 through 10/20/2007 under the care of Dr. Rockney Ghee.  CURRENT THERAPY: Observation.  INTERVAL HISTORY: Jacob Warner 83 y.o. male came to the clinic today for follow-up visit accompanied by his wife. The patient is doing very well today with no specific complaints except for low back pain. He denied having any significant weight loss or night sweats. He has no chest pain, shortness breath, cough or hemoptysis. He denied having any bleeding issues but has mild skin bruises. He denied having any fever or chills. He has no nausea or vomiting. He had repeat CBC performed earlier today and he is here for evaluation and discussion of his lab results.   MEDICAL HISTORY: Past Medical History:  Diagnosis Date  . A-fib ([place])   . Alopecia   . Anemia   . Bradycardia   . Cancer ([place])   . Cherry angioma   . GERD (gastroesophageal reflux disease)   . Glaucoma   . Hypertension   . Pelvic fracture ([place])   . Right BBB/left ant fasc block   . Sick sinus syndrome ([place])   . TIA (transient ischemic attack)   . Xeroderma     ALLERGIES:  is allergic to sulfa antibiotics and sulfacetamide sodium.  MEDICATIONS:  Current Outpatient Prescriptions  Medication Sig Dispense Refill  . acetaminophen (TYLENOL) 500 MG tablet Take 1,000 mg by mouth every 6 (six) hours as needed (pain).     Marland Kitchen amiodarone (PACERONE) 200 MG tablet TAKE 1 TABLET (200 MG TOTAL) BY MOUTH DAILY. 30 tablet 5  . aspirin 81 MG tablet Take 81 mg by mouth daily.    . Cyanocobalamin (VITAMIN B-12 PO) Take 1 tablet by mouth daily.    Marland Kitchen  doxazosin (CARDURA) 4 MG tablet Take 4 mg by mouth at bedtime.    . dutasteride (AVODART) 0.5 MG capsule Take 0.5 mg by mouth daily.    Marland Kitchen gabapentin (NEURONTIN) 100 MG capsule Take 1 capsule by mouth at bedtime. Also has a 300 mg capsule    . gabapentin (NEURONTIN) 300 MG capsule Take 300 mg by mouth every morning.     . lactulose (CHRONULAC) 10 GM/15ML solution Take 10 g by mouth 2 (two) times daily as needed for mild constipation.   0  . latanoprost (XALATAN) 0.005 % ophthalmic solution PLACE 1 DROP INTO RIGHT EYE AT BEDTIME  11  . LUMIGAN 0.01 % SOLN Place 1 drop into both eyes at bedtime.     . magnesium oxide (MAG-OX) 400 (241.3 MG) MG tablet Take 1 tablet by mouth 2 (two) times daily.  2  . RESTASIS 0.05 % ophthalmic emulsion PLACE 1 DROP INTO BOTH EYES 2 TIMES DAILY.  11  . traMADol (ULTRAM) 50 MG tablet Take 1 tablet by mouth daily as needed for pain.    . Vitamin D, Ergocalciferol, (DRISDOL) 50000 units CAPS capsule Take 1 capsule by mouth once a week. Takes on Tuesdays     No current facility-administered medications for this visit.     SURGICAL HISTORY:  Past Surgical History:  Procedure Laterality Date  . APPENDECTOMY    . CERVICAL SPINE SURGERY    .  EXPLORATORY LAPAROTOMY    . LITHOTRIPSY    . PACEMAKER PLACEMENT      REVIEW OF SYSTEMS:  A comprehensive review of systems was negative.   PHYSICAL EXAMINATION: General appearance: alert, cooperative and no distress Head: Normocephalic, without obvious abnormality, atraumatic Neck: no adenopathy, no JVD, supple, symmetrical, trachea midline and thyroid not enlarged, symmetric, no tenderness/mass/nodules Lymph nodes: Cervical, supraclavicular, and axillary nodes normal. Resp: clear to auscultation bilaterally Back: symmetric, no curvature. ROM normal. No CVA tenderness. Cardio: regular rate and rhythm, S1, S2 normal, no murmur, click, rub or gallop GI: soft, non-tender; bowel sounds normal; no masses,  no  organomegaly Extremities: extremities normal, atraumatic, no cyanosis or edema  ECOG PERFORMANCE STATUS: 1 - Symptomatic but completely ambulatory  Blood pressure (!) 134/51, pulse 73, temperature 97.8 F (36.6 C), temperature source Oral, resp. rate 18, height 6' (1.829 m), weight 221 lb 11.2 oz (100.6 kg), SpO2 99 %.  LABORATORY DATA: Lab Results  Component Value Date   WBC 4.2 01/07/2017   HGB 11.5 (L) 01/07/2017   HCT 34.1 (L) 01/07/2017   MCV 98.0 01/07/2017   PLT 180 01/07/2017      Chemistry      Component Value Date/Time   NA 137 06/20/2016 1119   K 5.0 06/20/2016 1119   CL 100 05/31/2015 1158   CO2 27 06/20/2016 1119   BUN 19.4 06/20/2016 1119   CREATININE 1.2 06/20/2016 1119      Component Value Date/Time   CALCIUM 9.2 06/20/2016 1119   ALKPHOS 33 (L) 06/20/2016 1119   AST 22 06/20/2016 1119   ALT 27 06/20/2016 1119   BILITOT 0.58 06/20/2016 1119       RADIOGRAPHIC STUDIES: No results found.  ASSESSMENT AND PLAN:  This is a very pleasant 83 years old white male with history of follicular lymphoma diagnosed in May 2008 status post systemic chemotherapy with Rituxan, Cytoxan and prednisone completed in October 2008 in [place]. He is currently on observation and the patient has no clear evidence for disease recurrence. CBC is still unremarkable except for the mild anemia. I discussed the lab result with the patient and his wife. I recommended for him to continue on observation with repeat CBC, comprehensive metabolic panel and LDH in 6 months. He was advised to call immediately if he has any concerning symptoms in the interval. The patient voices understanding of current disease status and treatment options and is in agreement with the current care plan.  All questions were answered. The patient knows to call the clinic with any problems, questions or concerns. We can certainly see the patient much sooner if necessary. I spent 10 minutes  counseling the patient face to face. The total time spent in the appointment was 15 minutes.  Disclaimer: This note was dictated with voice recognition software. Similar sounding words can inadvertently be transcribed and may not be corrected upon review.

## 2017-01-07 NOTE — Telephone Encounter (Signed)
GAVE PATIENT AVS REPORT AND APPOINTMENTS FOR July.  °

## 2017-01-09 NOTE — Progress Notes (Signed)
done

## 2017-01-09 NOTE — Addendum Note (Signed)
Addended by: Raymondo Band on: 01/09/2017 11:23 AM   Modules accepted: Orders

## 2017-01-12 ENCOUNTER — Telehealth (INDEPENDENT_AMBULATORY_CARE_PROVIDER_SITE_OTHER): Payer: Self-pay | Admitting: Specialist

## 2017-01-12 NOTE — Telephone Encounter (Signed)
Patient's wife calling to let you know that there is increased pain in the back and now going down into the buttocks. He must hold on to something or someone to keep from falling.  He normally uses cane, but even with cane he is unsteady.  Patient is only taking Advil.  He has an appt this Thursday @10 :45.  Please advise as what to do until then.

## 2017-01-13 ENCOUNTER — Other Ambulatory Visit: Payer: Self-pay | Admitting: Interventional Cardiology

## 2017-01-15 ENCOUNTER — Ambulatory Visit (INDEPENDENT_AMBULATORY_CARE_PROVIDER_SITE_OTHER): Payer: Medicare Other | Admitting: Specialist

## 2017-01-16 ENCOUNTER — Other Ambulatory Visit: Payer: Self-pay | Admitting: Interventional Cardiology

## 2017-01-17 ENCOUNTER — Encounter (INDEPENDENT_AMBULATORY_CARE_PROVIDER_SITE_OTHER): Payer: Self-pay | Admitting: Specialist

## 2017-01-17 ENCOUNTER — Ambulatory Visit (INDEPENDENT_AMBULATORY_CARE_PROVIDER_SITE_OTHER): Payer: Medicare Other | Admitting: Specialist

## 2017-01-17 VITALS — BP 127/70 | HR 87 | Ht 72.0 in | Wt 212.0 lb

## 2017-01-17 DIAGNOSIS — M48062 Spinal stenosis, lumbar region with neurogenic claudication: Secondary | ICD-10-CM

## 2017-01-17 DIAGNOSIS — M7062 Trochanteric bursitis, left hip: Secondary | ICD-10-CM | POA: Diagnosis not present

## 2017-01-17 MED ORDER — BUPIVACAINE HCL 0.25 % IJ SOLN
4.0000 mL | INTRAMUSCULAR | Status: AC | PRN
  Administered 2017-01-17: 4 mL via INTRA_ARTICULAR

## 2017-01-17 MED ORDER — TRAMADOL HCL 50 MG PO TABS: 50.0000 mg | ORAL_TABLET | Freq: Four times a day (QID) | ORAL | 0 refills | Status: DC | PRN

## 2017-01-17 MED ORDER — METHYLPREDNISOLONE ACETATE 40 MG/ML IJ SUSP
40.0000 mg | INTRAMUSCULAR | Status: AC | PRN
  Administered 2017-01-17: 40 mg via INTRA_ARTICULAR

## 2017-01-17 NOTE — Progress Notes (Signed)
Office Visit Note   Patient: Jacob Warner           Date of Birth: [birth year]           MRN: [id number] Visit Date: 01/17/2017              Requested by: [name], MD [address] [place], [place] [address] PCP: Jonathon Bellows, MD   Assessment & Plan: Visit Diagnoses:  1. Spinal stenosis of lumbar region with neurogenic claudication   2. Greater trochanteric bursitis, left    Plan: Avoid bending, stooping and avoid lifting weights greater than 10 lbs. Avoid prolong standing and walking. Avoid frequent bending and stooping  No lifting greater than 10 lbs. May use ice or moist heat for pain. Weight loss is of benefit. Handicap license is approved.     Follow-Up Instructions: Return in about 4 weeks (around 02/14/2017).   Orders:  Orders Placed This Encounter  Procedures  . Large Joint Injection/Arthrocentesis   Meds ordered this encounter  Medications  . traMADol (ULTRAM) 50 MG tablet    Sig: Take 1 tablet (50 mg total) by mouth every 6 (six) hours as needed for moderate pain.    Dispense:  40 tablet    Refill:  0      Procedures: Large Joint Inj Date/Time: 01/17/2017 10:55 AM Performed by: Jessy Oto Authorized by: Jessy Oto   Consent Given by:  Patient Site marked: the procedure site was marked   Indications:  Pain Location:  Hip Site:  L greater trochanter Prep: patient was prepped and draped in usual sterile fashion   Needle Size:  22 G Needle Length:  3.5 inches Approach:  Superolateral Ultrasound Guidance: No   Fluoroscopic Guidance: No   Arthrogram: No   Medications:  40 mg methylPREDNISolone acetate 40 MG/ML; 4 mL bupivacaine 0.25 % Aspiration Attempted: No   Patient tolerance:  Patient tolerated the procedure well with no immediate complications  Band aid applied to the left greater trochanter injection site.     Clinical Data: Findings:  CLINICAL DATA:  Low back pain extending into the right  lateral thigh.  EXAM: LUMBAR MYELOGRAM  FLUOROSCOPY TIME:  dictate in minutes and seconds  PROCEDURE: After thorough discussion of risks and benefits of the procedure including bleeding, infection, injury to nerves, blood vessels, adjacent structures as well as headache and CSF leak, written and oral informed consent was obtained. Consent was obtained by Dr. San Morelle. Time out form was completed.  Patient was positioned prone on the fluoroscopy table. Local anesthesia was provided with 1% lidocaine without epinephrine after prepped and draped in the usual sterile fashion. Puncture was performed at L3-4 using a 3 1/2 inch 22-gauge spinal needle via midline approach. Using a single pass through the dura, the needle was placed within the thecal sac, with return of clear CSF. 15 mL of Omnipaque-180 was injected into the thecal sac, with normal opacification of the nerve roots and cauda equina consistent with free flow within the subarachnoid space.  I personally performed the lumbar puncture and administered the intrathecal contrast. I also personally supervised acquisition of the myelogram images.  TECHNIQUE: Contiguous axial images were obtained through the Lumbar spine after the intrathecal infusion of infusion. Coronal and sagittal reconstructions were obtained of the axial image sets.  COMPARISON:  None  FINDINGS: LUMBAR MYELOGRAM FINDINGS:  A superior endplate fracture is present at L1. There is mild leftward curvature scratch the there is  mild rightward curvature of the lumbar spine centered at L1-2. A leftward disc protrusion at L1-2 results in moderate subarticular narrowing.  A leftward disc protrusion and subarticular stenosis is also present at T12-L1. A more broad-based disc protrusion is evident at L2-3 with subarticular narrowing bilaterally. Subarticular stenosis is worse right than left at L3-4 and L4-5. There is early truncation  of the right L4 and L5 nerve roots.  Retrolisthesis is present at L1-2 and L2-3. This does not change significantly upon standing there is no abnormal motion with flexion or extension. Central canal stenosis at L2-3, L3-4, and L4-5 is stable upon standing.  CT LUMBAR MYELOGRAM FINDINGS:  Lumbar spine is imaged from the midbody of T12 through S2 3. A superior endplate fracture at L1 appears mostly healed. Vertebral body heights are otherwise maintained. Alignment is anatomic. There is slight retrolisthesis at L1-2 and L2-3.  The conus medullaris terminates at L1-2, within normal limits. Minimal ventral subdural mixed contrast is present.  Atherosclerotic calcifications are present in the aorta and branch vessels without aneurysm.  T12-L1: A vacuum disc is present. Mild facet hypertrophy is noted. There is no significant stenosis.  L1-2: A leftward disc protrusion is present. This results in mild to moderate left subarticular stenosis. Mild foraminal narrowing is worse on the left.  L2-3: A broad-based disc protrusion is present. Mild to moderate facet hypertrophy is present. Moderate left and mild right subarticular stenosis is present. There is mild foraminal narrowing bilaterally.  L3-4: A broad-based disc protrusion is present. Moderate facet hypertrophy and ligamentum flavum thickening is noted. This results in moderate subarticular stenosis bilaterally. Mild to moderate foraminal stenosis is present bilaterally.  L4-5: A broad-based disc protrusion is present. Moderate facet hypertrophy is worse on the right. Mild subarticular narrowing is present bilaterally. Moderate foraminal stenosis is worse on the right.  L5-S1: Advanced facet hypertrophy is worse on the right. A mild broad-based disc protrusion is present. This results in mild to moderate right and mild left foraminal stenosis.  IMPRESSION: 1. Superior endplate fracture at L1 was a vacuum disc at  T12-L1. 2. Mild to moderate left subarticular and mild left foraminal stenosis at L1-2. 3. Moderate left and mild right subarticular stenosis at L2-3. There is mild foraminal narrowing at this level bilaterally. 4. Moderate subarticular and mild to moderate foraminal stenosis bilaterally at L3-4. 5. Mild subarticular and moderate foraminal stenosis at L4-5 is worse on the right. 6. Advanced facet hypertrophy and spurring leads to mild to moderate right and mild left foraminal stenosis. The central canal is patent.   Electronically Signed   By: San Morelle M.D.   On: 04/19/2015 15:12     Subjective: Chief Complaint  Patient presents with  . Lower Back - Follow-up, Pain    Mr. Reasoner is here to follow up on his back.  He states that it is not doing well.  He had a consult with Dr. Ernestina Patches for Spinal Cord Stimulator on 12/18/2016 and they are still waiting on the Psych evaluation to be done.  He has gotten to where its hard for him to walk.Severe left hip and leg pain with standing and ambulation. Pain radiates into the left greater trochanter. Pain with lying on the left side. Pain such that the left leg wants to give away. No bowel or bladder difficulty. It has been sometime since last ESI for lumbar spinal stenosis. MRI with changes at nearly every lumbar level, including HNP left T12-L1, Lateral recess stenosis left L1-2, and  L2-3, foramenal stenosis L3-4 and lateral recess stenosis left L4-5. All could relate to his current pain and claudication symptoms. His evaluation for an indwelling SCS is ongoing, psychology studies not done as yet due to the psychologist recent family death.     Review of Systems  Constitutional: Negative.   HENT: Negative.   Eyes: Negative.   Respiratory: Negative.   Cardiovascular: Negative.   Gastrointestinal: Negative.   Endocrine: Negative.   Genitourinary: Negative.   Musculoskeletal: Negative.   Skin: Negative.    Allergic/Immunologic: Negative.   Neurological: Negative.   Hematological: Negative.   Psychiatric/Behavioral: Negative.      Objective: Vital Signs: BP 127/70 (BP Location: Left Arm, Patient Position: Sitting)   Pulse 87   Ht 6' (1.829 m)   Wt 212 lb (96.2 kg)   BMI 28.75 kg/m   Physical Exam  Constitutional: He is oriented to person, place, and time. He appears well-developed and well-nourished.  HENT:  Head: Normocephalic and atraumatic.  Eyes: EOM are normal. Pupils are equal, round, and reactive to light.  Neck: Normal range of motion. Neck supple.  Pulmonary/Chest: Effort normal and breath sounds normal.  Abdominal: Soft. Bowel sounds are normal.  Neurological: He is alert and oriented to person, place, and time.  Skin: Skin is warm and dry.  Psychiatric: He has a normal mood and affect. His behavior is normal. Judgment and thought content normal.    Back Exam   Range of Motion  Extension: abnormal  Flexion: normal  Lateral Bend Right: abnormal  Lateral Bend Left: abnormal  Rotation Right: abnormal  Rotation Left: abnormal   Muscle Strength  The patient has normal back strength.  Tests  Straight leg raise right: negative Straight leg raise left: negative  Other  Toe Walk: abnormal Heel Walk: abnormal Sensation: normal Gait: abnormal  Erythema: no back redness Scars: absent  Comments:  Strength in sitting position is normal both legs.      Specialty Comments:  No specialty comments available.  Imaging: No results found.   PMFS History: Patient Active Problem List   Diagnosis Date Noted  . Bradycardia 05/31/2015  . Pacemaker 05/31/2015  . Peripheral edema 05/31/2015  . Heart murmur 05/31/2015  . Sick sinus syndrome ([place]) 05/31/2015  . Nonsustained ventricular tachycardia ([place]) 05/31/2015  . 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 A-fib ([place])   . Alopecia   . Anemia   . Bradycardia   .  Cancer ([place])   . Cherry angioma   . GERD (gastroesophageal reflux disease)   . Glaucoma   . Hypertension   . Pelvic fracture ([place])   . Right BBB/left ant fasc block   . Sick sinus syndrome ([place])   . TIA (transient ischemic attack)   . Xeroderma     Family History  Problem Relation Age of Onset  . Brain cancer Sister     Past Surgical History:  Procedure Laterality Date  . APPENDECTOMY    . CERVICAL SPINE SURGERY    . EXPLORATORY LAPAROTOMY    . LITHOTRIPSY    . PACEMAKER PLACEMENT     Social History   Occupational History  . Not on file.   Social History Main Topics  . Smoking status: Never Smoker  . Smokeless tobacco: Never Used  . Alcohol use No  . Drug use: No  . Sexual activity: Not on file

## 2017-01-17 NOTE — Patient Instructions (Signed)
Avoid bending, stooping and avoid lifting weights greater than 10 lbs. Avoid prolong standing and walking. Avoid frequent bending and stooping  No lifting greater than 10 lbs. May use ice or moist heat for pain. Weight loss is of benefit. Handicap license is approved.  

## 2017-01-19 NOTE — Telephone Encounter (Signed)
Seen in the office 01/17/2017, evaluated and discussed options. See note. ESI is ordered. Multiple comorbidities, increased concern of risk associated with surgical intervention.  jen

## 2017-01-26 NOTE — Telephone Encounter (Signed)
Patient called to inquire about procedure. CB# [phone]

## 2017-01-27 NOTE — Telephone Encounter (Signed)
Patients wife is wanting to know when they will get scheduled for for psych eval.  She said they have been waiting for a long time for this and want to get it done.

## 2017-01-28 NOTE — Telephone Encounter (Signed)
I called Dr. Billee Cashing Dermott's office to see where we are in the referral process. They have not returned my call. I called and spoke to the patient's wife telling her that the referral has been sent and that Dr. Vikki Ports will review and his office will call to schedule. She said she hasn't heard anything from anyone in weeks. I did call and leave a message when Dr. Vikki Ports agreed to take the referral informing the patient and his wife that a referral had been sent and that they would be contacted by that office to be scheduled.

## 2017-02-02 ENCOUNTER — Telehealth (INDEPENDENT_AMBULATORY_CARE_PROVIDER_SITE_OTHER): Payer: Self-pay | Admitting: Physical Medicine and Rehabilitation

## 2017-02-02 DIAGNOSIS — G894 Chronic pain syndrome: Secondary | ICD-10-CM

## 2017-02-02 DIAGNOSIS — M5416 Radiculopathy, lumbar region: Secondary | ICD-10-CM

## 2017-02-02 NOTE — Telephone Encounter (Signed)
Patient's wife called wanting to know the status of her husband's referral to Dr. Vikki Ports. She asked if there is anyone else he can be referred to. Dr. Ernestina Patches placed referral to Dr. Sima Matas at [facility]. I called the patient's wife and let her know that the referral had been placed and that is would be the same process as the previous referral- Dr. Sima Matas will review the patient's records and his office will call them to schedule.

## 2017-02-02 NOTE — Telephone Encounter (Signed)
Looks like I figured out how to do the referral.

## 2017-02-06 ENCOUNTER — Encounter: Payer: Medicare Other | Attending: Psychology | Admitting: Psychology

## 2017-02-06 DIAGNOSIS — Z79899 Other long term (current) drug therapy: Secondary | ICD-10-CM | POA: Insufficient documentation

## 2017-02-06 DIAGNOSIS — M961 Postlaminectomy syndrome, not elsewhere classified: Secondary | ICD-10-CM

## 2017-02-06 DIAGNOSIS — M549 Dorsalgia, unspecified: Secondary | ICD-10-CM | POA: Diagnosis not present

## 2017-02-06 DIAGNOSIS — F419 Anxiety disorder, unspecified: Secondary | ICD-10-CM | POA: Insufficient documentation

## 2017-02-06 DIAGNOSIS — Z7982 Long term (current) use of aspirin: Secondary | ICD-10-CM | POA: Diagnosis not present

## 2017-02-06 DIAGNOSIS — M25559 Pain in unspecified hip: Secondary | ICD-10-CM | POA: Diagnosis not present

## 2017-02-06 DIAGNOSIS — R262 Difficulty in walking, not elsewhere classified: Secondary | ICD-10-CM | POA: Diagnosis not present

## 2017-02-06 DIAGNOSIS — G894 Chronic pain syndrome: Secondary | ICD-10-CM

## 2017-02-06 NOTE — Progress Notes (Signed)
Patient:   Jacob Warner   DOB:   [birth year]  MR Number:  [id number]  Location:  [facility] [address] [place] [id number] [place] [address] Dept: [phone]           Date of Service:   02/06/2017  Start Time:   10 AM End Time:   11 AM  Provider/Observer:  Edgardo Roys PSYD       Billing Code/Service: [phone]  Chief Complaint:     Chief Complaint  Patient presents with  . Pain  . Other  . Back Pain  . Hip Pain    Reason for Service:  The patient is a 83 year old Caucasian male who was referred by Dr. Ernestina Patches for psychological evaluation as part of the the initial evaluation workup for consideration for spinal cord stimulator due to his chronic pain condition. The patient describes significant back pain that resulted froma fall in which she broke his pelvis and suffered lower back injury. The patient reports that this fall occurred in 2015 and has continued to have chronic/significant pain from this condition. The patient has had prior orthopedic/neurosurgical issues. He reports that the first surgery on  Neck went quite well. The second surgery had some complications that resulted in residual injury to nerve root and spinal cord. The patient had a much longer hospital stay after the surgery and he developed blood clots and also hada transient ischemic attack. No TPA was administered. However after this second surgery the patient has continued to have residual numbness in his leg below his knees as well as numbness down his arms. The chronic pain situation did not develop until after related to his broken pelvis.  The patient reports he recovered from this TIA quite well. However, he reports that he does have some issues with short-term memory that are likely commensurate with his age and overall medical functioning. There were no indications of any degenerative  dementia processes such as Alzheimer's or Parkinson's.The patient does have some issues with gait and leg strength.    Current Status:  The patient describes very minimal issues with anxiety and some sleep issues. He does report that he has difficulty walking as a result of this prior injury and chronic pain.  Reliability of Information: Information is provided by the patient as well as a review of available medical records. This information does appear to be valid and reliable.  Behavioral Observation: Jacob Warner  presents as a 83 y.o.-year-old Right Caucasian Male who appeared his stated age. his dress was Appropriate and he was Well Groomed and his manners were Appropriate to the situation.  his participation was indicative of Appropriate behaviors.  There were physical disabilities noted related to walking and overall strength.  he displayed an appropriate level of cooperation and motivation.     Interactions:    Active Appropriate  Attention:   within normal limits and attention span and concentration were age appropriate  Memory:   within normal limits; recent and remote memory intact  Visuo-spatial:  within normal limits  Speech (Volume):  normal  Speech:   normal;   Thought Process:  Coherent  Though Content:  WNL;   Orientation:   person, place, time/date and situation  Judgment:   Good  Planning:   Good  Affect:    Anxious  Mood:    Anxious  Insight:   Good  Intelligence:   high  Marital Status/Living: The patient was born in [place] and grew up in [place]. The patient is married and lives with his wife.  They are both living independently  Current Employment: The patient is not working at this time.  Past Employment:  The patient has worked for [employer] in the past and also the [employer]. 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  Substance Use:  No concerns  of substance abuse are reported.    Education:   Engineering geologist History:   Past Medical History:  Diagnosis Date  . A-fib ([place])   . Alopecia   . Anemia   . Bradycardia   . Cancer ([place])   . Cherry angioma   . GERD (gastroesophageal reflux disease)   . Glaucoma   . Hypertension   . Pelvic fracture ([place])   . Right BBB/left ant fasc block   . Sick sinus syndrome ([place])   . TIA (transient ischemic attack)   . Xeroderma         Outpatient Encounter Prescriptions as of 02/06/2017  Medication Sig  . acetaminophen (TYLENOL) 500 MG tablet Take 1,000 mg by mouth every 6 (six) hours as needed (pain).   Marland Kitchen amiodarone (PACERONE) 200 MG tablet TAKE 1 TABLET (200 MG TOTAL) BY MOUTH DAILY.  Marland Kitchen aspirin 81 MG tablet Take 81 mg by mouth daily.  . Cyanocobalamin (VITAMIN B-12 PO) Take 1 tablet by mouth daily.  Marland Kitchen doxazosin (CARDURA) 4 MG tablet Take 4 mg by mouth at bedtime.  . dutasteride (AVODART) 0.5 MG capsule Take 0.5 mg by mouth daily.  Marland Kitchen gabapentin (NEURONTIN) 100 MG capsule Take 1 capsule by mouth at bedtime. Also has a 300 mg capsule  . gabapentin (NEURONTIN) 300 MG capsule Take 300 mg by mouth every morning.   . lactulose (CHRONULAC) 10 GM/15ML solution Take 10 g by mouth 2 (two) times daily as needed for mild constipation.   Marland Kitchen latanoprost (XALATAN) 0.005 % ophthalmic solution PLACE 1 DROP INTO RIGHT EYE AT BEDTIME  . LUMIGAN 0.01 % SOLN Place 1 drop into both eyes at bedtime.   . magnesium oxide (MAG-OX) 400 (241.3 MG) MG tablet Take 1 tablet by mouth 2 (two) times daily.  . RESTASIS 0.05 % ophthalmic emulsion PLACE 1 DROP INTO BOTH EYES 2 TIMES DAILY.  . traMADol (ULTRAM) 50 MG tablet Take 1 tablet by mouth daily as needed for pain.  . traMADol (ULTRAM) 50 MG tablet Take 1 tablet (50 mg total) by mouth every 6 (six) hours as needed for moderate pain.  . Vitamin D, Ergocalciferol, (DRISDOL) 50000 units CAPS capsule Take 1 capsule by mouth once a week. Takes on Tuesdays   No  facility-administered encounter medications on file as of 02/06/2017.           Sexual History:   History  Sexual Activity  . Sexual activity: Not on file    Psychiatric History:  No prior psychiatric history noted.  Family Med/Psych History:  Family History  Problem Relation Age of Onset  . Brain cancer Sister     Risk of Suicide/Violence: virtually non-existent   Impression/DX:  Patient has significant chronic pain symptoms that developed after a severe fall in which he fractured his hip and injured his lower back. The patient also has some prior medical history  However, it was his fault that apparently created the chronic pain. The patient has had 2 prior cervical spine  surgeries. The second of these surgeries did not go as planned and apparently resulted in some injury to his nerve root/spinal cord. The patient also developed blood clots after the extended hospital stay and suffered a transient ischemic attack. The patient has experienced numbness in his legs below his knee as well as numbness in his arms post surgery. 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 The patient does acknowledge some short-term memory issues over his younger abilities. However,his current level of function appears to be commensurate with his age without any indications of dementia ordegenerative dementia.  Disposition/Plan:  Patient has been provided a copy of the MMPI-2 and the P3 pain inventory to take with him to complete at home. He is to bring these inventories back on Monday for scoring interpretation, and report writing.  Diagnosis:    Chronic pain syndrome  Post laminectomy syndrome         Electronically Signed   _______________________ Ilean Skill, Psy.D.

## 2017-02-16 ENCOUNTER — Encounter (HOSPITAL_BASED_OUTPATIENT_CLINIC_OR_DEPARTMENT_OTHER): Payer: Medicare Other | Admitting: Psychology

## 2017-02-16 DIAGNOSIS — G894 Chronic pain syndrome: Secondary | ICD-10-CM | POA: Diagnosis not present

## 2017-02-16 DIAGNOSIS — M961 Postlaminectomy syndrome, not elsewhere classified: Secondary | ICD-10-CM | POA: Diagnosis not present

## 2017-02-23 ENCOUNTER — Ambulatory Visit (INDEPENDENT_AMBULATORY_CARE_PROVIDER_SITE_OTHER): Payer: Medicare Other | Admitting: Specialist

## 2017-02-23 ENCOUNTER — Encounter (INDEPENDENT_AMBULATORY_CARE_PROVIDER_SITE_OTHER): Payer: Self-pay | Admitting: Specialist

## 2017-02-23 VITALS — BP 144/71 | HR 70 | Ht 72.0 in | Wt 212.0 lb

## 2017-02-23 DIAGNOSIS — M961 Postlaminectomy syndrome, not elsewhere classified: Secondary | ICD-10-CM | POA: Diagnosis not present

## 2017-02-23 DIAGNOSIS — M48062 Spinal stenosis, lumbar region with neurogenic claudication: Secondary | ICD-10-CM | POA: Diagnosis not present

## 2017-02-23 DIAGNOSIS — M5416 Radiculopathy, lumbar region: Secondary | ICD-10-CM

## 2017-02-23 NOTE — Patient Instructions (Signed)
Avoid bending, stooping and avoid lifting weights greater than 10 lbs. Avoid prolong standing and walking. Avoid frequent bending and stooping  No lifting greater than 10 lbs. May use ice or moist heat for pain. Weight loss is of benefit. Handicap license is approved. Dr. Romona Curls secretary/Assistant will call to arrange for further evauation for consideration of a spinal cor stimulator as the psychological testing is completed.

## 2017-02-23 NOTE — Progress Notes (Signed)
Office Visit Note   Patient: Jacob Warner           Date of Birth: April 20, 1924           MRN: [id number] Visit Date: 02/23/2017              Requested by: [name], MD [address], [place] [id number] PCP: Jonathon Bellows, MD   Assessment & Plan: Visit Diagnoses:  1. Chronic left lumbar radiculopathy   2. Lumbar post-laminectomy syndrome   3. Spinal stenosis of lumbar region with neurogenic claudication     Plan: Avoid bending, stooping and avoid lifting weights greater than 10 lbs. Avoid prolong standing and walking. Avoid frequent bending and stooping  No lifting greater than 10 lbs. May use ice or moist heat for pain. Weight loss is of benefit. Handicap license is approved. Dr. Romona Curls secretary/Assistant will call to arrange for further evauation for consideration of a spinal cor stimulator as the psychological testing is completed.  Follow-Up Instructions: No Follow-up on file.   Orders:  No orders of the defined types were placed in this encounter.  No orders of the defined types were placed in this encounter.     Procedures: No procedures performed   Clinical Data: No additional findings.   Subjective: Chief Complaint  Patient presents with  . Lower Back - Follow-up  . Left Hip - Follow-up    Mr. Ringstaff is here for follow up after left greater troch injection on  01/17/2017, which he states that he has helped his hip pain.  He states that he has had the psych evaluation for Spinal cord stimulator on 02/06/2017 with Dr. Ilean Skill.  Left hip pain is relieved with the trochanteric bursa injection. Saw dr Sima Matas and has completed testing for consideration of the spinal cord stimulator. Not a good candidate for surgical solution so spinal cord stimulator is being considered.     Review of Systems  Constitutional: Negative.   HENT: Negative.   Eyes: Negative.   Respiratory: Negative.   Cardiovascular: Negative.     Gastrointestinal: Negative.   Endocrine: Negative.   Genitourinary: Negative.   Musculoskeletal: Negative.   Skin: Negative.   Allergic/Immunologic: Negative.   Neurological: Negative.   Hematological: Negative.   Psychiatric/Behavioral: Negative.      Objective: Vital Signs: BP (!) 144/71 (BP Location: Left Arm, Patient Position: Sitting)   Pulse 70   Ht 6' (1.829 m)   Wt 212 lb (96.2 kg)   BMI 28.75 kg/m   Physical Exam  Constitutional: He is oriented to person, place, and time. He appears well-developed and well-nourished.  HENT:  Head: Normocephalic and atraumatic.  Eyes: EOM are normal. Pupils are equal, round, and reactive to light.  Neck: Normal range of motion. Neck supple.  Pulmonary/Chest: Effort normal and breath sounds normal.  Abdominal: Soft. Bowel sounds are normal.  Neurological: He is alert and oriented to person, place, and time.  Skin: Skin is warm and dry.  Psychiatric: He has a normal mood and affect. His behavior is normal. Judgment and thought content normal.    Back Exam   Tenderness  The patient is experiencing tenderness in the lumbar.  Range of Motion  Extension: abnormal  Flexion: normal  Lateral Bend Right: normal  Lateral Bend Left: normal  Rotation Right: normal   Muscle Strength  Right Quadriceps:  5/5  Left Quadriceps:  5/5  Right Hamstrings:  5/5  Left Hamstrings:  5/5  Tests  Straight leg raise right: negative Straight leg raise left: negative  Reflexes  Patellar: normal Achilles: normal Babinski's sign: normal   Other  Toe Walk: normal Heel Walk: normal Sensation: normal Gait: normal  Erythema: no back redness Scars: absent      Specialty Comments:  No specialty comments available.  Imaging: No results found.   PMFS History: Patient Active Problem List   Diagnosis Date Noted  . Bradycardia 05/31/2015  . Pacemaker 05/31/2015  . Peripheral edema 05/31/2015  . Heart murmur 05/31/2015  . Sick  sinus syndrome ([place]) 05/31/2015  . Nonsustained ventricular tachycardia ([place]) 05/31/2015  . 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 A-fib ([place])   . Alopecia   . Anemia   . Bradycardia   . Cancer ([place])   . Cherry angioma   . GERD (gastroesophageal reflux disease)   . Glaucoma   . Hypertension   . Pelvic fracture ([place])   . Right BBB/left ant fasc block   . Sick sinus syndrome ([place])   . TIA (transient ischemic attack)   . Xeroderma     Family History  Problem Relation Age of Onset  . Brain cancer Sister     Past Surgical History:  Procedure Laterality Date  . APPENDECTOMY    . CERVICAL SPINE SURGERY    . EXPLORATORY LAPAROTOMY    . LITHOTRIPSY    . PACEMAKER PLACEMENT     Social History   Occupational History  . Not on file.   Social History Main Topics  . Smoking status: Never Smoker  . Smokeless tobacco: Never Used  . Alcohol use No  . Drug use: No  . Sexual activity: Not on file

## 2017-02-24 NOTE — Progress Notes (Signed)
Formal Report to be appended

## 2017-03-13 ENCOUNTER — Encounter (INDEPENDENT_AMBULATORY_CARE_PROVIDER_SITE_OTHER): Payer: Self-pay | Admitting: Physical Medicine and Rehabilitation

## 2017-03-13 ENCOUNTER — Ambulatory Visit (INDEPENDENT_AMBULATORY_CARE_PROVIDER_SITE_OTHER): Payer: Medicare Other | Admitting: Physical Medicine and Rehabilitation

## 2017-03-13 ENCOUNTER — Ambulatory Visit (INDEPENDENT_AMBULATORY_CARE_PROVIDER_SITE_OTHER): Payer: Medicare Other

## 2017-03-13 VITALS — BP 130/78 | HR 81 | Temp 98.2°F

## 2017-03-13 DIAGNOSIS — M961 Postlaminectomy syndrome, not elsewhere classified: Secondary | ICD-10-CM | POA: Diagnosis not present

## 2017-03-13 DIAGNOSIS — G894 Chronic pain syndrome: Secondary | ICD-10-CM

## 2017-03-13 MED ORDER — CEPHALEXIN 500 MG PO CAPS: ORAL_CAPSULE | ORAL | 0 refills | Status: DC

## 2017-03-13 MED ORDER — LIDOCAINE HCL (PF) 1 % IJ SOLN
0.3300 mL | Freq: Once | INTRAMUSCULAR | Status: AC
  Administered 2017-03-13: 0.3 mL

## 2017-03-13 NOTE — Progress Notes (Signed)
Jacob Warner - 83 y.o. male MRN [id number]  Date of birth: 1924-07-10  Office Visit Note: Visit Date: 03/13/2017 PCP: Jonathon Bellows, MD Referred by: [name], MD  Subjective: Chief Complaint  Patient presents with  . Lower Back - Pain   HPI: [name] is a young appearing 83 year old gentleman who is followed rather closely by [name] in our office. He has had 2 prior lumbar laminectomies and continues to have chronic worsening recalcitrant severe low back pain with referral into both hips and buttock area. He denies any specific paresthesias down the legs although he does get referral into the buttock area much like a neurogenic claudication. He is multilevel arthritic changes on the latest CT myelogram study which is reviewed below. He has had radiofrequency ablation of facet joints without any relief at all. He has had medication management without much relief. Functionally he is limited in the amount of time he can stand and movements quite bothersome for him. He is a fairly active 83 year old gentleman. We have seen him recently through [name] for evaluation of spinal cord stimulator trial. He has gone through psychological pretesting and there was nothing found that would indicate a reason not to complete a spinal cord generator trial. Those notes are in the chart as well.   Review of Systems  Constitutional: Negative for chills, fever, malaise/fatigue and weight loss.  HENT: Negative for hearing loss and sinus pain.   Eyes: Negative for blurred vision, double vision and photophobia.  Respiratory: Negative for cough and shortness of breath.   Cardiovascular: Negative for chest pain, palpitations and leg swelling.  Gastrointestinal: Negative for abdominal pain, nausea and vomiting.  Genitourinary: Negative for flank pain.  Musculoskeletal: Positive for back pain. Negative for myalgias.  Skin: Negative for itching and rash.  Neurological: Negative for tremors, focal  weakness and weakness.  Endo/Heme/Allergies: Negative.   Psychiatric/Behavioral: Negative for depression.  All other systems reviewed and are negative.  Otherwise per HPI.  Assessment & Plan: Visit Diagnoses:  1. Post laminectomy syndrome   2. Chronic pain syndrome     Plan: Findings:  Successful percutaneous lead placement of a [employer] lead. The lead was placed just left of midline with the uppermost electrode at the top of the T6 vertebral body. We were actually able to cover all of his areas of pain with just one lead placed in this position. At that point we did stop the procedure and did not place a second lead. Depending on the outcome I still think he might be benefiting from a 2-lead placement if this becomes a permanent implant. We have him scheduled for next week for lead pull. When he left the office he was fairly pleased with the results at that point.    Meds & Orders:  Meds ordered this encounter  Medications  . lidocaine (PF) (XYLOCAINE) 1 % injection 0.3 mL  . cephALEXin (KEFLEX) 500 MG capsule    Sig: Take 1 po by mouth twice per day for 7 days    Dispense:  14 capsule    Refill:  0    Orders Placed This Encounter  Procedures  . Spinal Cord Stimulator - Placement  . Spinal Cord Stimulator - Analysis  . XR C-ARM NO REPORT    Follow-up: Return for spinal cord stim lead pull as scheduled.   Procedures: No procedures performed  Spinal Cord Stimulator Trial  Patient: Jacob Warner      Date of Birth:  [birth year] MRN: [id number] PCP: Jonathon Bellows, MD      Visit Date: 03/13/2017   Universal Protocol:    Date/Time: 03/16/188:36 AM  Consent Given By: the patient  Position: prone  Additional Comments: Vital signs were monitored before and after the procedure. Patient was prepped and draped in the usual sterile fashion. The correct patient, procedure, and site was verified.   Injection Procedure Details:  Procedure Site One Meds  Administered:  Meds ordered this encounter  Medications  . lidocaine (PF) (XYLOCAINE) 1 % injection 0.3 mL     Laterality: Left  Location/Site:  T6-7  Needle size: 16 G  Needle type: Epimed  Needle Placement: Epidural entry on the right at T11-12   Findings:    -Comments: The final lead placement had the most cranial electrode sitting at the superior endplate of the T6 vertebral body just barely left of midline with the rest of the electrodes spanning almost 2 vertebral bodies.  Procedure Details: After localization of the 11-12 epidural space and the overlying soft tissue, the needle was introduced two bodies below this level to produce an adequate angle for epidural penetration. The soft tissue was infiltrated with 2-3 mls. of 1% Lidocaine without Epinephrine. Using the posterolateral approach, the #14 gauge Epimed needle was inserted down to the posterior aspect of the T12 lamina and then "walked off" the superior aspect of this lamina into the T11-12 epidural space. The epidural space was localized with loss of resistance and negative aspirate for CSF or blood. The [employer] (16) electrode stimulation lead was passed up so that just the superior most lead was at the location noted above while maintaining the lead in the midline.   The pulse generator system was adjusted and programmed by myself and the company technical representative by varying the stimulator sites, rates, pulse amplitude, and duration. Adequate coverage was obtained as described above.  Subsequent to this, the introducer needle was removed and the spinal cord stimulator trial lead was fastened to the skin using steri-strips and strain reduction loop. The lead wire was then connected and Tegaderm was applied to produce an occlusive dressing. The patient was then brought out of the procedure room and was given a portable stimulator.  The patient will follow-up in three to seven days to determine  whether this was efficacious.  Keflex 500 mg twice a day was prescribed during the course of the trial.   Additional Comments:  The patient tolerated the procedure well No complications occurred Dressing: Dressing is as described above    Post-procedure details: Patient was observed during the procedure. Post-procedure instructions were reviewed. Patient left the clinic in stable condition.     Clinical History: Lumbar CT myelogram 04/19/2015 IMPRESSION: 1. Superior endplate fracture at L1 was a vacuum disc at T12-L1. 2. Mild to moderate left subarticular and mild left foraminal stenosis at L1-2. 3. Moderate left and mild right subarticular stenosis at L2-3. There is mild foraminal narrowing at this level bilaterally. 4. Moderate subarticular and mild to moderate foraminal stenosis bilaterally at L3-4. 5. Mild subarticular and moderate foraminal stenosis at L4-5 is worse on the right. 6. Advanced facet hypertrophy and spurring leads to mild to moderate right and mild left foraminal stenosis. The central canal is patent.  He reports that he has never smoked. He has never used smokeless tobacco. No results for input(s): HGBA1C, LABURIC in the last 8760 hours.  Objective:  VS:  HT:    WT:   BMI:  BP:130/78  HR:81bpm  TEMP:98.2 F (36.8 C)(Oral)  RESP:94 % Physical Exam  Constitutional: He is oriented to person, place, and time. He appears well-developed and well-nourished. No distress.  HENT:  Head: Normocephalic and atraumatic.  Eyes: Conjunctivae are normal. Pupils are equal, round, and reactive to light.  Neck: Normal range of motion. Neck supple.  Cardiovascular: Regular rhythm and intact distal pulses.   Pulmonary/Chest: Effort normal. No respiratory distress.  Musculoskeletal:  The patient ambulates with a cane with a forward flexed spine. He is good distal strength bilaterally with no clonus.  Neurological: He is alert and oriented to person, place, and time.    Skin: Skin is warm and dry. No rash noted. No erythema.  Psychiatric: He has a normal mood and affect.  Nursing note and vitals reviewed.   Ortho Exam Imaging: No results found.  Past Medical/Family/Surgical/Social History: Medications & Allergies reviewed per EMR Patient Active Problem List   Diagnosis Date Noted  . Bradycardia 05/31/2015  . Pacemaker 05/31/2015  . Peripheral edema 05/31/2015  . Heart murmur 05/31/2015  . Sick sinus syndrome ([place]) 05/31/2015  . Nonsustained ventricular tachycardia ([place]) 05/31/2015  . 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 A-fib ([place])   . Alopecia   . Anemia   . Bradycardia   . Cancer ([place])   . Cherry angioma   . GERD (gastroesophageal reflux disease)   . Glaucoma   . Hypertension   . Pelvic fracture ([place])   . Right BBB/left ant fasc block   . Sick sinus syndrome ([place])   . TIA (transient ischemic attack)   . Xeroderma    Family History  Problem Relation Age of Onset  . Brain cancer Sister    Past Surgical History:  Procedure Laterality Date  . APPENDECTOMY    . CERVICAL SPINE SURGERY    . EXPLORATORY LAPAROTOMY    . LITHOTRIPSY    . PACEMAKER PLACEMENT     Social History   Occupational History  . Not on file.   Social History Main Topics  . Smoking status: Never Smoker  . Smokeless tobacco: Never Used  . Alcohol use No  . Drug use: No  . Sexual activity: Not on file

## 2017-03-13 NOTE — Procedures (Signed)
Spinal Cord Stimulator Trial  Patient: Jacob Warner      Date of Birth: 1924-08-20 MRN: [id number] PCP: Jonathon Bellows, MD      Visit Date: 03/13/2017   Universal Protocol:    Date/Time: 03/16/188:36 AM  Consent Given By: the patient  Position: prone  Additional Comments: Vital signs were monitored before and after the procedure. Patient was prepped and draped in the usual sterile fashion. The correct patient, procedure, and site was verified.   Injection Procedure Details:  Procedure Site One Meds Administered:  Meds ordered this encounter  Medications  . lidocaine (PF) (XYLOCAINE) 1 % injection 0.3 mL     Laterality: Left  Location/Site:  T6-7  Needle size: 16 G  Needle type: Epimed  Needle Placement: Epidural entry on the right at T11-12   Findings:    -Comments: The final lead placement had the most cranial electrode sitting at the superior endplate of the T6 vertebral body just barely left of midline with the rest of the electrodes spanning almost 2 vertebral bodies.  Procedure Details: After localization of the 11-12 epidural space and the overlying soft tissue, the needle was introduced two bodies below this level to produce an adequate angle for epidural penetration. The soft tissue was infiltrated with 2-3 mls. of 1% Lidocaine without Epinephrine. Using the posterolateral approach, the #14 gauge Epimed needle was inserted down to the posterior aspect of the T12 lamina and then "walked off" the superior aspect of this lamina into the T11-12 epidural space. The epidural space was localized with loss of resistance and negative aspirate for CSF or blood. The [employer] (16) electrode stimulation lead was passed up so that just the superior most lead was at the location noted above while maintaining the lead in the midline.   The pulse generator system was adjusted and programmed by myself and the company technical representative by varying the  stimulator sites, rates, pulse amplitude, and duration. Adequate coverage was obtained as described above.  Subsequent to this, the introducer needle was removed and the spinal cord stimulator trial lead was fastened to the skin using steri-strips and strain reduction loop. The lead wire was then connected and Tegaderm was applied to produce an occlusive dressing. The patient was then brought out of the procedure room and was given a portable stimulator.  The patient will follow-up in three to seven days to determine whether this was efficacious.  Keflex 500 mg twice a day was prescribed during the course of the trial.   Additional Comments:  The patient tolerated the procedure well No complications occurred Dressing: Dressing is as described above    Post-procedure details: Patient was observed during the procedure. Post-procedure instructions were reviewed. Patient left the clinic in stable condition.

## 2017-03-17 ENCOUNTER — Encounter (INDEPENDENT_AMBULATORY_CARE_PROVIDER_SITE_OTHER): Payer: Self-pay | Admitting: Physical Medicine and Rehabilitation

## 2017-03-17 ENCOUNTER — Ambulatory Visit (INDEPENDENT_AMBULATORY_CARE_PROVIDER_SITE_OTHER): Payer: Medicare Other | Admitting: *Deleted

## 2017-03-17 DIAGNOSIS — I495 Sick sinus syndrome: Secondary | ICD-10-CM | POA: Diagnosis not present

## 2017-03-17 NOTE — Progress Notes (Signed)
Remote pacemaker transmission.   

## 2017-03-17 NOTE — Patient Instructions (Signed)

## 2017-03-18 ENCOUNTER — Encounter: Payer: Self-pay | Admitting: Cardiology

## 2017-03-19 ENCOUNTER — Encounter (INDEPENDENT_AMBULATORY_CARE_PROVIDER_SITE_OTHER): Payer: Self-pay | Admitting: Physical Medicine and Rehabilitation

## 2017-03-19 ENCOUNTER — Encounter: Payer: Medicare Other | Attending: Psychology | Admitting: Psychology

## 2017-03-19 ENCOUNTER — Ambulatory Visit (INDEPENDENT_AMBULATORY_CARE_PROVIDER_SITE_OTHER): Payer: Medicare Other | Admitting: Physical Medicine and Rehabilitation

## 2017-03-19 DIAGNOSIS — M25559 Pain in unspecified hip: Secondary | ICD-10-CM | POA: Insufficient documentation

## 2017-03-19 DIAGNOSIS — G8929 Other chronic pain: Secondary | ICD-10-CM

## 2017-03-19 DIAGNOSIS — M545 Low back pain, unspecified: Secondary | ICD-10-CM

## 2017-03-19 DIAGNOSIS — G894 Chronic pain syndrome: Secondary | ICD-10-CM

## 2017-03-19 DIAGNOSIS — Z7982 Long term (current) use of aspirin: Secondary | ICD-10-CM | POA: Diagnosis not present

## 2017-03-19 DIAGNOSIS — M549 Dorsalgia, unspecified: Secondary | ICD-10-CM | POA: Diagnosis not present

## 2017-03-19 DIAGNOSIS — F419 Anxiety disorder, unspecified: Secondary | ICD-10-CM | POA: Diagnosis not present

## 2017-03-19 DIAGNOSIS — M961 Postlaminectomy syndrome, not elsewhere classified: Secondary | ICD-10-CM | POA: Insufficient documentation

## 2017-03-19 DIAGNOSIS — R262 Difficulty in walking, not elsewhere classified: Secondary | ICD-10-CM | POA: Diagnosis not present

## 2017-03-19 DIAGNOSIS — Z79899 Other long term (current) drug therapy: Secondary | ICD-10-CM | POA: Insufficient documentation

## 2017-03-19 LAB — CUP PACEART REMOTE DEVICE CHECK
Battery Remaining Longevity: 36 mo
Battery Remaining Percentage: 56 %
Brady Statistic RA Percent Paced: 99 %
Brady Statistic RV Percent Paced: 100 %
Date Time Interrogation Session: 20180320052300
Implantable Lead Implant Date: 20130926
Implantable Lead Implant Date: 20130926
Implantable Lead Location: 753859
Implantable Lead Location: 753860
Implantable Lead Model: 4456
Implantable Lead Model: 4476
Implantable Lead Serial Number: 473023
Implantable Lead Serial Number: 523746
Implantable Pulse Generator Implant Date: 20130926
Lead Channel Impedance Value: 378 Ohm
Lead Channel Impedance Value: 448 Ohm
Lead Channel Pacing Threshold Amplitude: 0.6 V
Lead Channel Pacing Threshold Amplitude: 0.9 V
Lead Channel Pacing Threshold Pulse Width: 0.4 ms
Lead Channel Pacing Threshold Pulse Width: 0.4 ms
Lead Channel Setting Pacing Amplitude: 1.4 V
Lead Channel Setting Pacing Amplitude: 2 V
Lead Channel Setting Pacing Pulse Width: 0.4 ms
Lead Channel Setting Sensing Sensitivity: 2.5 mV
Pulse Gen Serial Number: 138586

## 2017-03-19 NOTE — Progress Notes (Signed)
Jacob Warner - 83 y.o. male MRN [id number]  Date of birth: [birth year]  Office Visit Note: Visit Date: 03/19/2017 PCP: Jacob Bellows, MD Referred by: Jacob Small, MD  Subjective: Chief Complaint  Patient presents with  . Lower Back - Pain   HPI: Jacob Warner is a 83 year old gentleman that comes in with his son. He is here today for lead pull after spinal cord stimulator trial. He reports that he has been doing very well. Says it has made a "whole lot of difference." He reports turning the stimulator off when he left home, so pain is beginning to be about a 3 or 4. No pain at all when stimulator is on. He reports nearly 100% relief of his axial low back and buttock pain. He still denies leg pain or paresthesia. No focal weakness.     Has been doing very well. Says it has made a "whole lot of difference." Turned stim off when he left home, so pain is beginning to be about a 3 or 4. No pain at all when stim is on. Review of Systems  Constitutional: Negative for chills, fever, malaise/fatigue and weight loss.  HENT: Negative for hearing loss and sinus pain.   Eyes: Negative for blurred vision, double vision and photophobia.  Respiratory: Negative for cough and shortness of breath.   Cardiovascular: Negative for chest pain, palpitations and leg swelling.  Gastrointestinal: Negative for abdominal pain, nausea and vomiting.  Genitourinary: Negative for flank pain.  Musculoskeletal: Positive for back pain. Negative for myalgias.  Skin: Negative for itching and rash.  Neurological: Negative for tremors, focal weakness and weakness.  Endo/Heme/Allergies: Negative.   Psychiatric/Behavioral: Negative for depression.  All other systems reviewed and are negative.  Otherwise per HPI.  Assessment & Plan: Visit Diagnoses:  1. Post laminectomy syndrome   2. Chronic pain syndrome   3. Chronic bilateral low back pain without sciatica     Plan: Findings:  Chronic worsening severe low back  pain referral on the buttocks but nothing down the legs no paresthesias no weakness. He has been recalcitrant to conservative care including medication management and physical therapy injections and he has had prior surgery. He is now status post successful spinal cord stimulator trial. Please note the stimulator trial report for positioning of the leads. He did extremely well and reports almost 100% relief of stimulator in place and turned on. He is very happy with the results and does want to progress forward into implantation. We will put in a referral consultation with Jacob [facility] at [facility].    Meds & Orders: No orders of the defined types were placed in this encounter.   Orders Placed This Encounter  Procedures  . Ambulatory referral to Neurosurgery    Follow-up: Return if symptoms worsen or fail to improve.   Procedures: No procedures performed  For pulling the temporary spinal cord stimulator lead the patient was placed in a prone position and the dressing was moved without difficulty. There were no signs of infection or swelling or induration or drainage. The was the one Warner area in the upper right quadrant of the dressing that showed some skin irritation. We did address this with a bandage and did show his son. The lead pull was uneventful. We did place a Band-Aid over the injection site.  No notes on file   Clinical History: Lumbar CT myelogram 04/19/2015 IMPRESSION: 1. Superior endplate fracture at L1 was a vacuum disc at T12-L1. 2. Mild to  moderate left subarticular and mild left foraminal stenosis at L1-2. 3. Moderate left and mild right subarticular stenosis at L2-3. There is mild foraminal narrowing at this level bilaterally. 4. Moderate subarticular and mild to moderate foraminal stenosis bilaterally at L3-4. 5. Mild subarticular and moderate foraminal stenosis at L4-5 is worse on the right. 6. Advanced facet hypertrophy and spurring leads to mild to  moderate right and mild left foraminal stenosis. The central canal is patent.  He reports that he has never smoked. He has never used smokeless tobacco. No results for input(s): HGBA1C, LABURIC in the last 8760 hours.  Objective:  VS:  HT:    WT:   BMI:     BP:   HR: bpm  TEMP: ( )  RESP:  Physical Exam  Musculoskeletal:  Examination of the skin around the injection site just shows one area in the upper right part of the area with a dressing was applied there is a Warner reddened area of skin irritation but otherwise there is no redness and erythema or induration or drainage. The patient has good distal strength and is moving very well.    Ortho Exam Imaging: No results found.  Past Medical/Family/Surgical/Social History: Medications & Allergies reviewed per EMR Patient Active Problem List   Diagnosis Date Noted  . Post laminectomy syndrome 03/19/2017  . Chronic pain syndrome 03/19/2017  . Chronic bilateral low back pain without sciatica 03/19/2017  . Bradycardia 05/31/2015  . Pacemaker 05/31/2015  . Peripheral edema 05/31/2015  . Heart murmur 05/31/2015  . Sick sinus syndrome ([place]) 05/31/2015  . Nonsustained ventricular tachycardia ([place]) 05/31/2015  . 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 A-fib ([place])   . Alopecia   . Anemia   . Bradycardia   . Cancer ([place])   . Cherry angioma   . GERD (gastroesophageal reflux disease)   . Glaucoma   . Hypertension   . Pelvic fracture ([place])   . Right BBB/left ant fasc block   . Sick sinus syndrome ([place])   . TIA (transient ischemic attack)   . Xeroderma    Family History  Problem Relation Age of Onset  . Brain cancer Sister    Past Surgical History:  Procedure Laterality Date  . APPENDECTOMY    . CERVICAL SPINE SURGERY    . EXPLORATORY LAPAROTOMY    . LITHOTRIPSY    . PACEMAKER PLACEMENT     Social History   Occupational History  . Not on file.   Social History Main Topics  .  Smoking status: Never Smoker  . Smokeless tobacco: Never Used  . Alcohol use No  . Drug use: No  . Sexual activity: Not on file

## 2017-03-19 NOTE — Progress Notes (Signed)
Patient:  Jacob Warner   DOB: January 23, 1924  MR Number: [id number]  Location: [facility] [address], [place] [id number] [place] [address] Dept: [phone]  Start: 2:30 PM End: 3:30 PM  Provider/Observer:     Edgardo Roys PSYD  Chief Complaint:      Chief Complaint  Patient presents with  . Pain    Reason For Service:    The patient is a 83 year old Caucasian male who was referred by Dr. Ernestina Patches for psychological evaluation as part of the the initial evaluation workup for consideration for spinal cord stimulator due to his chronic pain condition. The patient describes significant back pain that resulted froma fall in which he broke his pelvis and suffered lower back injury. The patient reports that this fall occurred in 2015 and has continued to have chronic/significant pain from this condition.  The patient has had prior orthopedic/neurosurgical issues. He reports that the first surgery on his neck went quite well. The second surgery had some complications that resulted in residual injury to nerve root and spinal cord. The patient had a much longer hospital stay after the surgery and he developed blood clots and also had a transient ischemic attack. No TPA was administered. However after this second surgery, the patient has continued to have residual numbness in his leg below his knees as well as numbness down his arms. The chronic pain situation did not develop until after related to his broken pelvis.  The patient reports he recovered from this TIA quite well. However, he reports that he does have some issues with short-term memory that are likely commensurate with his age and overall medical functioning. There were no indications of any degenerative dementia processes such as Alzheimer's or Parkinson's.The patient does have some issues with gait and leg  strength.  Testing Administered:   MMPI-2 and the Patient Pain Profile (P3)  Participation Level:   Active  Participation Quality:  Appropriate and Attentive      Behavioral Observation:  Well Groomed, Alert, and Appropriate.   Test Results:   The patient completed the [place] multiphasic personality inventory-2.  The resulting validity scales suggest that the patient neither attempted to exaggerate or minimize clinically significant symptoms and did not attempt to make efforts to place themselves and either an overly positive or negative light. Therefore, the resulting clinical profile appears suggest a valid administration. As far as specific clinical scales, the patient showed significant elevations of specific as well as vague description of various physical and medical complaints. This does, however, correlate with the medical history that accompanies the patient. The patient also has a mild elevation on the depressive scale that appears to primarily related is subconscious effort to repress feelings of being overwhelmed by the numerous medical issues. There are no other clinical scales that were elevated within the basic MMPI-2 profile.  Further analysis utilizing content scales show only one significant elevation having to do with specific health concerns. This level is not typically found with a normative population but is consistent with those typically found in chronic pain patients. Analysis utilizing supplemental scales suggest that the primary issues with depressive features have to do with specific health concerns rather than a pattern of somatization disorder. The patient does not have an elevation of scales shown to correlate with vulnerability toward substance abuse. There also do not appear to be significant underlying personality variables that would pose significant problematic behaviors  relative to possible planned medical interventions.  On the P-3 measure, the patient produced  a valid profile. There were no indications that he failed to understand questions or attempted to exaggerate or minimize symptoms. The patient did have a mildly elevated score on somatization scale. This pattern along with those found with the MMPI-2 suggest that he has a heighten focus on medical and physical functioning. Given all of the medical and surgical interventions that have receded, this level of focus does appear to be consistent with what would be suspected. The patient had no significant elevations for elements related to either depression or anxiety.   Summary of Results:   Overall, the results of the current psychological evaluation based on both an extensive clinical interview as well as objective psychological testing suggest that the patient is an excellent candidate for pre-implant trial and/or implantation of a spinal cord stimulator. There were no indications of significant levels of psychiatric difficulties related to symptoms such as severe depression, anxiety, personality disorders or psychiatric/psychotic types of conditions. The patient clearly appeared to understand the potential risks and benefits from the SCS interventions and has a realistic level of expectations.  There are no indications of substance abuse issues or prior traumatic experiences that would pose vulnerabilities to success. Also, the patient's cognitive functioning appeared to be quite good and even excellent compared to his age related peers. There did not appear to be any residual effects of his prior transient ischemic attacks. While the patient does have a heighten focus on his overall medical and physical functioning, this does not appear to be related to a somatizations disorder and more related to the degree and number of medical issues he has had to cope with and deal with the past several years. He would clearly meet the diagnosis of a chronic pain syndrome and again appears to be an excellent candidate from a  psychological perspective trial and potential implantation of a spinal cord stimulator.   Diagnosis:    Axis I: Chronic pain syndrome  Chronic bilateral low back pain without sciatica   Ilean Skill, Psy.D. Neuropsychologist

## 2017-04-07 ENCOUNTER — Ambulatory Visit: Payer: Medicare Other | Admitting: Internal Medicine

## 2017-04-07 ENCOUNTER — Other Ambulatory Visit: Payer: Medicare Other

## 2017-04-24 ENCOUNTER — Ambulatory Visit (INDEPENDENT_AMBULATORY_CARE_PROVIDER_SITE_OTHER): Payer: Medicare Other | Admitting: Cardiology

## 2017-04-24 ENCOUNTER — Encounter: Payer: Self-pay | Admitting: Cardiology

## 2017-04-24 VITALS — BP 128/76 | HR 71 | Ht 70.0 in | Wt 221.6 lb

## 2017-04-24 DIAGNOSIS — I480 Paroxysmal atrial fibrillation: Secondary | ICD-10-CM

## 2017-04-24 DIAGNOSIS — Z79899 Other long term (current) drug therapy: Secondary | ICD-10-CM | POA: Diagnosis not present

## 2017-04-24 DIAGNOSIS — Z950 Presence of cardiac pacemaker: Secondary | ICD-10-CM

## 2017-04-24 NOTE — Patient Instructions (Signed)
Your physician recommends that you return for lab work TODAY - TSH, LFTs  Your physician wants you to follow-up in: 6 months with Dr. Meda Coffee. You will receive a reminder letter in the mail two months in advance. If you don't receive a letter, please call our office to schedule the follow-up appointment.

## 2017-04-24 NOTE — Progress Notes (Addendum)
04/24/2017 Jacob Warner   [birth year]  [id number]  Primary Physician Jacob Bellows, MD Primary Cardiologist: Dr. Meda Warner  Electrophysiologist: Dr. Lovena Warner   Reason for Visit/CC: 6 month f/u for SSS and PAF  HPI:  Jacob Warner is a 83 y.o. male who is being seen today for routine f/u. He established care with our practice just a few years ago. He moved to [place] from [place], [place], in 2016 to live closer to his 2 sons. He was seen initially by Dr. Mare Warner but transitioned to Dr. Meda Warner following his retirement.   His cardiac history is significant for SSS. Per records, he presented to his PCP in 2013 with a heart rate of 39. He was symptomatic with his bradycardia. On 09/23/12 he underwent implantation of a Catering manager at [facility]. The site of implantation is in the right upper chest because he has the remnants of a previous Port-A-Cath in the left upper chest. The patient has a past history of having had nodular lymphoma in 2008. This was treated successfully with chemotherapy. He has been in remission. His oncologist here in [place] is Dr. Julien Warner. He has no documented history of IHD, nor HF. Also no history of HTN or DM. There has also been concern for PAF, thus he has been treated with low dose Amiodarone. He is on ASA only. He has a h/o falls thus not on a/c. In addition to Dr. Meda Warner, he has also established care with Dr. Lovena Warner in [place] clinic for his PPM.   In addition, he has a prior h/o PE and TIA that occurred after undergoing back surgery several years ago. His TIA resulted in some memory deficit.    He is here in clinic today with his wife. He is doing well. He now has a back stimulator. No CP or dyspnea. He is able to get around with use of a cane. He is still on 200 mg of amiodarone daily. No recent thyroid or hepatic function test. He denies any dyspnea. He sees an eye doctor regularly. EKG shows SR. LBBB. Unchanged from previous. HR 71 bpm.  BP is well controlled at 128/76.       Current Meds  Medication Sig  . acetaminophen (TYLENOL) 500 MG tablet Take 1,000 mg by mouth every 6 (six) hours as needed (pain).   Marland Kitchen amiodarone (PACERONE) 200 MG tablet TAKE 1 TABLET (200 MG TOTAL) BY MOUTH DAILY.  Marland Kitchen aspirin 81 MG tablet Take 81 mg by mouth daily.  . Cyanocobalamin (VITAMIN B-12 PO) Take 1 tablet by mouth daily.  Marland Kitchen doxazosin (CARDURA) 4 MG tablet Take 4 mg by mouth at bedtime.  . dutasteride (AVODART) 0.5 MG capsule Take 0.5 mg by mouth daily.  Marland Kitchen gabapentin (NEURONTIN) 300 MG capsule Take 300 mg by mouth every morning.   . lactulose (CHRONULAC) 10 GM/15ML solution Take 10 g by mouth 2 (two) times daily as needed for mild constipation.   Marland Kitchen latanoprost (XALATAN) 0.005 % ophthalmic solution PLACE 1 DROP INTO BOTH EYES AT BEDTIME  . magnesium oxide (MAG-OX) 400 (241.3 MG) MG tablet Take 1 tablet by mouth 2 (two) times daily.  . RESTASIS 0.05 % ophthalmic emulsion PLACE 1 DROP INTO BOTH EYES 2 TIMES DAILY.  . traMADol (ULTRAM) 50 MG tablet Take 1 tablet (50 mg total) by mouth every 6 (six) hours as needed for moderate pain.  . Vitamin D, Ergocalciferol, (DRISDOL) 50000 units CAPS capsule Take 1 capsule by mouth once a week. Takes on  Tuesdays   Allergies  Allergen Reactions  . Sulfa Antibiotics Hives and Rash    Bactrim  . Sulfacetamide Sodium Rash and Hives    Bactrim   Past Medical History:  Diagnosis Date  . A-fib ([place])   . Alopecia   . Anemia   . Bradycardia   . Cancer ([place])   . Cherry angioma   . GERD (gastroesophageal reflux disease)   . Glaucoma   . Hypertension   . Pelvic fracture ([place])   . Right BBB/left ant fasc block   . Sick sinus syndrome ([place])   . TIA (transient ischemic attack)   . Xeroderma    Family History  Problem Relation Age of Onset  . Brain cancer Sister    Past Surgical History:  Procedure Laterality Date  . APPENDECTOMY    . CERVICAL SPINE SURGERY    . EXPLORATORY LAPAROTOMY    .  LITHOTRIPSY    . PACEMAKER PLACEMENT     Social History   Social History  . Marital status: Married    Spouse name: N/A  . Number of children: N/A  . Years of education: N/A   Occupational History  . Not on file.   Social History Main Topics  . Smoking status: Never Smoker  . Smokeless tobacco: Never Used  . Alcohol use No  . Drug use: No  . Sexual activity: Not on file   Other Topics Concern  . Not on file   Social History Narrative  . No narrative on file     Review of Systems: General: negative for chills, fever, night sweats or weight changes.  Cardiovascular: negative for chest pain, dyspnea on exertion, edema, orthopnea, palpitations, paroxysmal nocturnal dyspnea or shortness of breath Dermatological: negative for rash Respiratory: negative for cough or wheezing Urologic: negative for hematuria Abdominal: negative for nausea, vomiting, diarrhea, bright red blood per rectum, melena, or hematemesis Neurologic: negative for visual changes, syncope, or dizziness All other systems reviewed and are otherwise negative except as noted above.   Physical Exam:  Blood pressure 128/76, pulse 71, height 5\' 10"  (1.778 m), weight 221 lb 9.6 oz (100.5 kg), SpO2 95 %.  General appearance: alert, cooperative and no distress Neck: no carotid bruit and no JVD Lungs: clear to auscultation bilaterally Heart: regular rate and rhythm, S1, S2 normal, no murmur, click, rub or gallop Extremities: trace bilateral LEE Pulses: 2+ and symmetric Skin: Skin color, texture, turgor normal. No rashes or lesions Neurologic: Grossly normal  EKG SR. LBBB, unchanged from previous, 71 bpm -- personally reviewed   ASSESSMENT AND PLAN:   1. SSS: s/p [employer] PPM implant in 2013 at [facility]. Now followed by Dr. Lovena Warner.   2. PAF: maintaining NSR on low dose amiodarone. No dyspnea or visual changes. Lung exam normal. He is followed regularly by an eye doctor. We will check TSH and HFTs today.  Continue ASA. He is not on A/C given fall risk.  f/u with Dr. Meda Warner in 6 months.    Jacob Warner, MHS [facility] 04/24/2017 11:53 AM

## 2017-04-25 LAB — HEPATIC FUNCTION PANEL
ALT: 18 IU/L (ref 0–44)
AST: 19 IU/L (ref 0–40)
Albumin: 4.2 g/dL (ref 3.2–4.6)
Alkaline Phosphatase: 35 IU/L — ABNORMAL LOW (ref 39–117)
Bilirubin Total: 0.4 mg/dL (ref 0.0–1.2)
Bilirubin, Direct: 0.11 mg/dL (ref 0.00–0.40)
Total Protein: 5.9 g/dL — ABNORMAL LOW (ref 6.0–8.5)

## 2017-04-25 LAB — TSH: TSH: 2.3 u[IU]/mL (ref 0.450–4.500)

## 2017-04-28 ENCOUNTER — Encounter (INDEPENDENT_AMBULATORY_CARE_PROVIDER_SITE_OTHER): Payer: Self-pay | Admitting: Physical Medicine and Rehabilitation

## 2017-04-28 ENCOUNTER — Ambulatory Visit (INDEPENDENT_AMBULATORY_CARE_PROVIDER_SITE_OTHER): Payer: Medicare Other | Admitting: Physical Medicine and Rehabilitation

## 2017-04-28 VITALS — BP 139/73 | HR 70

## 2017-04-28 DIAGNOSIS — M961 Postlaminectomy syndrome, not elsewhere classified: Secondary | ICD-10-CM | POA: Diagnosis not present

## 2017-04-28 DIAGNOSIS — M25511 Pain in right shoulder: Secondary | ICD-10-CM | POA: Diagnosis not present

## 2017-04-28 DIAGNOSIS — G8929 Other chronic pain: Secondary | ICD-10-CM | POA: Diagnosis not present

## 2017-04-28 DIAGNOSIS — M609 Myositis, unspecified: Secondary | ICD-10-CM | POA: Diagnosis not present

## 2017-04-28 DIAGNOSIS — M546 Pain in thoracic spine: Secondary | ICD-10-CM | POA: Diagnosis not present

## 2017-04-28 NOTE — Progress Notes (Signed)
Jacob Warner - 83 y.o. male MRN [id number]  Date of birth: Aug 17, 1924  Office Visit Note: Visit Date: 04/28/2017 PCP: Jonathon Bellows, MD Referred by: [name], MD  Subjective: Chief Complaint  Patient presents with  . Right Arm - Pain  . Right Shoulder - Pain   HPI: Jacob Warner is a very pleasant 83 year old gentleman that we have seen through [name] for chronic history of back pain with stenosis and spondylosis of prior surgery. We completed spinal cord stimulator trial back in March with excellent relief of his back pain and increased mobility. He went on to have implant performed by Dr. Maryjean Ka. He is doing extremely well with that now and really reports increased mobility and ability to walk further do more things with the turned on. He's had some reprogramming recently and feels like things are going pretty well with his stimulator. He still reports some back pain but really much improved. His biggest complaint coming in today was that after the stimulator trial around the day that we remove the leads he began having right arm and shoulder pain. He reports pain from the shoulder down to the elbow on the lateral aspect of the arm. He denies any numbness tingling or paresthesias in the hands. He denies any left-sided complaints. He reports the pain is fairly constant it's worse with movement of the arm. He says it's not as bad this morning as it has been but is been pretty rough. Their concern was because it happened seemingly right after we removed the leads after the stimulator trial. He has pain in the upper back as well that radiates towards the shoulder area. He denies any specific neck pain in general he does have some neck pain. He's had no prior shoulder surgeries. He is really denied much in the way of weakness or has difficulty moving the shoulder.    Review of Systems  Constitutional: Negative for chills, fever, malaise/fatigue and weight loss.  HENT: Negative for hearing  loss and sinus pain.   Eyes: Negative for blurred vision, double vision and photophobia.  Respiratory: Negative for cough and shortness of breath.   Cardiovascular: Negative for chest pain, palpitations and leg swelling.  Gastrointestinal: Negative for abdominal pain, nausea and vomiting.  Genitourinary: Negative for flank pain.  Musculoskeletal: Positive for back pain and joint pain. Negative for myalgias.  Skin: Negative for itching and rash.  Neurological: Negative for tremors, focal weakness and weakness.  Endo/Heme/Allergies: Negative.   Psychiatric/Behavioral: Negative for depression.  All other systems reviewed and are negative.  Otherwise per HPI.  Assessment & Plan: Visit Diagnoses:  1. Chronic right shoulder pain   2. Chronic right-sided thoracic back pain   3. Myofascitis   4. Post laminectomy syndrome     Plan: Findings:  Worsening right shoulder pain which is consistent with impingement type syndrome on the right. This is likely a subacromial bursitis. It may have just been him getting up on the table or just serendipitously that it started that day. We discussed how the leads are pulled during the ending of the trial and it's a pretty simple process and is really nothing with his leads were or about the process itself that would give somebody shoulder pain. His exam is consistent again with impingement he has pain with abduction past 98 and has hard time doing that without sharp pain. External rotation gives him a pain as well. He does have trigger points which could be part of this these  are in the trapezius levator scapula and rhomboid. We did complete a diagnostic and hopefully therapeutic subacromial joint injection of the right shoulder and he had excellent relief during the anesthetic phase. I also discussed with his wife about trigger point treatment and she is going try to help with that. I think he should do well with his shoulder and I reassured him this was not  something from the removal of leads. In terms of his low back is doing well with spinal corset related trial he does have a appointment coming up with [name]. [name] to review his shoulder to time as well. I spent more than 25 minutes speaking face-to-face with the patient with 50% of the time in counseling.    Meds & Orders: No orders of the defined types were placed in this encounter.   Orders Placed This Encounter  Procedures  . Large Joint Injection/Arthrocentesis    Follow-up: Return if symptoms worsen or fail to improve, for [name].   Procedures: Subacromial bursa injection Date/Time: 04/28/2017 8:48 AM Performed by: Magnus Sinning Authorized by: Magnus Sinning   Consent Given by:  Patient Site marked: the procedure site was marked   Timeout: prior to procedure the correct patient, procedure, and site was verified   Indications:  Pain and diagnostic evaluation Location:  Shoulder Site:  R subacromial bursa Prep: patient was prepped and draped in usual sterile fashion   Needle Size:  22 G Needle Length:  1.5 inches Approach:  Posterior Ultrasound Guidance: No   Fluoroscopic Guidance: No   Arthrogram: No   Medications:  4 mL lidocaine 2 %; 40 mg triamcinolone acetonide 40 MG/ML Aspiration Attempted: No   Patient tolerance:  Patient tolerated the procedure well with no immediate complications  Injectate delivered freely without restriction through the normal position. Patient had  relief during the anesthetic phase.      No notes on file   Clinical History: Lumbar CT myelogram 04/19/2015 IMPRESSION: 1. Superior endplate fracture at L1 was a vacuum disc at T12-L1. 2. Mild to moderate left subarticular and mild left foraminal stenosis at L1-2. 3. Moderate left and mild right subarticular stenosis at L2-3. There is mild foraminal narrowing at this level bilaterally. 4. Moderate subarticular and mild to moderate foraminal stenosis bilaterally at L3-4. 5.  Mild subarticular and moderate foraminal stenosis at L4-5 is worse on the right. 6. Advanced facet hypertrophy and spurring leads to mild to moderate right and mild left foraminal stenosis. The central canal is patent.  He reports that he has never smoked. He has never used smokeless tobacco. No results for input(s): HGBA1C, LABURIC in the last 8760 hours.  Objective:  VS:  HT:    WT:   BMI:     BP:139/73  HR:70bpm  TEMP: ( )  RESP:  Physical Exam  Constitutional: He is oriented to person, place, and time. He appears well-developed and well-nourished. No distress.  HENT:  Head: Normocephalic and atraumatic.  Nose: Nose normal.  Mouth/Throat: Oropharynx is clear and moist.  Eyes: Conjunctivae are normal. Pupils are equal, round, and reactive to light.  Neck: Normal range of motion. Neck supple.  Cardiovascular: Regular rhythm and intact distal pulses.   Pulmonary/Chest: Effort normal and breath sounds normal.  Abdominal: Soft. He exhibits no distension.  Musculoskeletal: He exhibits no deformity.  Patient ambulates with a cane with good distal strength. Examination of the bilateral shoulders shows impingement sign on the right with pain with abduction and external rotation more  than internal rotation. He has good strength in all those movements. He has a negative drop arm test. He does have active trigger points in the levator scapula and trapezius and maybe even supraspinatus. He has symmetric strength in the upper extremities bilaterally with a negative Hoffmann's test.  Neurological: He is alert and oriented to person, place, and time.  Skin: Skin is warm. No rash noted.  Psychiatric: He has a normal mood and affect. His behavior is normal.  Nursing note and vitals reviewed.   Ortho Exam Imaging: No results found.  Past Medical/Family/Surgical/Social History: Medications & Allergies reviewed per EMR Patient Active Problem List   Diagnosis Date Noted  . Post laminectomy  syndrome 03/19/2017  . Chronic pain syndrome 03/19/2017  . Chronic bilateral low back pain without sciatica 03/19/2017  . Bradycardia 05/31/2015  . Pacemaker 05/31/2015  . Peripheral edema 05/31/2015  . Heart murmur 05/31/2015  . Sick sinus syndrome ([place]) 05/31/2015  . Nonsustained ventricular tachycardia ([place]) 05/31/2015  . 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 A-fib ([place])   . Alopecia   . Anemia   . Bradycardia   . Cancer ([place])   . Cherry angioma   . GERD (gastroesophageal reflux disease)   . Glaucoma   . Hypertension   . Pelvic fracture ([place])   . Right BBB/left ant fasc block   . Sick sinus syndrome ([place])   . TIA (transient ischemic attack)   . Xeroderma    Family History  Problem Relation Age of Onset  . Brain cancer Sister    Past Surgical History:  Procedure Laterality Date  . APPENDECTOMY    . CERVICAL SPINE SURGERY    . EXPLORATORY LAPAROTOMY    . LITHOTRIPSY    . PACEMAKER PLACEMENT     Social History   Occupational History  . Not on file.   Social History Main Topics  . Smoking status: Never Smoker  . Smokeless tobacco: Never Used  . Alcohol use No  . Drug use: No  . Sexual activity: Not on file

## 2017-04-28 NOTE — Progress Notes (Deleted)
Right arm and shoulder pain down to elbow which started the day after spinal cord stimulator trial leads removed. Also reports right sided upper back pain. Pain is constant. It is not as bad this morning as it has been.

## 2017-04-29 MED ORDER — TRIAMCINOLONE ACETONIDE 40 MG/ML IJ SUSP
40.0000 mg | INTRAMUSCULAR | Status: AC | PRN
  Administered 2017-04-28: 40 mg via INTRA_ARTICULAR

## 2017-04-29 MED ORDER — LIDOCAINE HCL 2 % IJ SOLN
4.0000 mL | INTRAMUSCULAR | Status: AC | PRN
  Administered 2017-04-28: 4 mL

## 2017-06-12 ENCOUNTER — Other Ambulatory Visit: Payer: Self-pay

## 2017-06-12 MED ORDER — AMIODARONE HCL 200 MG PO TABS: ORAL_TABLET | ORAL | 1 refills | Status: DC

## 2017-06-16 ENCOUNTER — Ambulatory Visit (INDEPENDENT_AMBULATORY_CARE_PROVIDER_SITE_OTHER): Payer: Medicare Other | Admitting: *Deleted

## 2017-06-16 DIAGNOSIS — I495 Sick sinus syndrome: Secondary | ICD-10-CM | POA: Diagnosis not present

## 2017-06-16 LAB — CUP PACEART REMOTE DEVICE CHECK
Battery Remaining Longevity: 36 mo
Battery Remaining Percentage: 55 %
Brady Statistic RA Percent Paced: 99 %
Brady Statistic RV Percent Paced: 100 %
Date Time Interrogation Session: 20180619045700
Implantable Lead Implant Date: 20130926
Implantable Lead Implant Date: 20130926
Implantable Lead Location: 753859
Implantable Lead Location: 753860
Implantable Lead Model: 4456
Implantable Lead Model: 4476
Implantable Lead Serial Number: 473023
Implantable Lead Serial Number: 523746
Implantable Pulse Generator Implant Date: 20130926
Lead Channel Impedance Value: 396 Ohm
Lead Channel Impedance Value: 478 Ohm
Lead Channel Pacing Threshold Amplitude: 0.6 V
Lead Channel Pacing Threshold Amplitude: 0.9 V
Lead Channel Pacing Threshold Pulse Width: 0.4 ms
Lead Channel Pacing Threshold Pulse Width: 0.4 ms
Lead Channel Setting Pacing Amplitude: 1.3 V
Lead Channel Setting Pacing Amplitude: 2 V
Lead Channel Setting Pacing Pulse Width: 0.4 ms
Lead Channel Setting Sensing Sensitivity: 2.5 mV
Pulse Gen Serial Number: 138586

## 2017-06-16 NOTE — Progress Notes (Signed)
Remote pacemaker transmission.   

## 2017-06-19 ENCOUNTER — Encounter: Payer: Self-pay | Admitting: Cardiology

## 2017-06-23 ENCOUNTER — Telehealth (INDEPENDENT_AMBULATORY_CARE_PROVIDER_SITE_OTHER): Payer: Self-pay | Admitting: Physical Medicine and Rehabilitation

## 2017-06-23 NOTE — Telephone Encounter (Signed)
I called the patient's wife to let her know we were waiting to hear back from the [employer]. She said they are scheduled to meet with Erlene Quan from [place] morning at Dr. Maryjean Ka' office.

## 2017-06-23 NOTE — Telephone Encounter (Signed)
I will get in touch with [facility] and then likley get CT scan and follow up with me. Should be able to get fairly quickly.

## 2017-06-26 ENCOUNTER — Ambulatory Visit (INDEPENDENT_AMBULATORY_CARE_PROVIDER_SITE_OTHER): Payer: Medicare Other

## 2017-06-26 ENCOUNTER — Telehealth (INDEPENDENT_AMBULATORY_CARE_PROVIDER_SITE_OTHER): Payer: Self-pay | Admitting: Physical Medicine and Rehabilitation

## 2017-06-26 ENCOUNTER — Ambulatory Visit (INDEPENDENT_AMBULATORY_CARE_PROVIDER_SITE_OTHER): Payer: Medicare Other | Admitting: Physical Medicine and Rehabilitation

## 2017-06-26 ENCOUNTER — Encounter (INDEPENDENT_AMBULATORY_CARE_PROVIDER_SITE_OTHER): Payer: Self-pay | Admitting: Physical Medicine and Rehabilitation

## 2017-06-26 VITALS — BP 114/63 | HR 70

## 2017-06-26 DIAGNOSIS — Z9689 Presence of other specified functional implants: Secondary | ICD-10-CM | POA: Diagnosis not present

## 2017-06-26 DIAGNOSIS — M609 Myositis, unspecified: Secondary | ICD-10-CM | POA: Diagnosis not present

## 2017-06-26 DIAGNOSIS — G8929 Other chronic pain: Secondary | ICD-10-CM | POA: Diagnosis not present

## 2017-06-26 DIAGNOSIS — M545 Low back pain, unspecified: Secondary | ICD-10-CM

## 2017-06-26 DIAGNOSIS — M961 Postlaminectomy syndrome, not elsewhere classified: Secondary | ICD-10-CM

## 2017-06-26 MED ORDER — LIDOCAINE HCL 1 % IJ SOLN
3.0000 mL | INTRAMUSCULAR | Status: AC | PRN
  Administered 2017-06-26: 3 mL

## 2017-06-26 MED ORDER — TRIAMCINOLONE ACETONIDE 40 MG/ML IJ SUSP
40.0000 mg | INTRAMUSCULAR | Status: AC | PRN
  Administered 2017-06-26: 40 mg via INTRA_ARTICULAR

## 2017-06-26 NOTE — Progress Notes (Signed)
Pain on left side of lower back. Says pain is right where battery for stimulator is and just below it also. Took him over 3 hours to get out of bed this morning. Says he has no leg pain. Maybe some left hip pain.

## 2017-06-26 NOTE — Telephone Encounter (Signed)
Patient to be seen this morning  

## 2017-06-26 NOTE — Telephone Encounter (Signed)
If they can get here this morning we can see, if this afternoon see if Dr. Barbara Cower can see him as [name] Patient, I can talk to them. Was seen at [facility] office but unsure I he was seen by him. Erlene Quan told me yesterday he was having more radicular type pain which was npt what he was having prior to SCS trial or implant.

## 2017-06-26 NOTE — Progress Notes (Signed)
Jacob Warner - 83 y.o. male MRN [id number]  Date of birth: 01/21/1924  Office Visit Note: Visit Date: 06/26/2017 PCP: [name], MD Referred by: [name], MD  Subjective: Chief Complaint  Patient presents with  . Lower Back - Pain   HPI: Jacob Warner is a very pleasant 83 year old gentleman accompanied by his wife who provided some of the history. We have known over the last week that Jacob Warner was having some difficulty with his low back and left buttock and hip region. Jacob Warner had had his spinal cord stimulator reprogrammed by Tillie Rung one of the [employer]. After that didn't help Jacob Warner was seen at Dr. Maryjean Ka office by another [employer], [place]. Erlene Quan had notified me that Jacob Warner was having pain in the left buttock and hip region and that Jacob Warner did reprogram them with several other programs. At that time Jacob Warner wanted him to try over the weekend to see how Jacob Warner would do with the new programs. Jacob Warner did not see Dr. Maryjean Ka at that time. The patient today reports worsening symptoms last night where it took him almost 3 hours to get in and out of bed. Jacob Warner has a lot of stiffness and pain in the left buttock region really puts his hand over the PSIS area on the left. Jacob Warner has no right-sided complaints. Jacob Warner has no pain or paresthesia down the legs. Jacob Warner is always experience some tingling in the feet but this is nothing new. Jacob Warner has had no recent falls. No recent trauma. No change in other medications. Jacob Warner has had no bowel or bladder changes although Jacob Warner has noticed some urinary frequency changes. Jacob Warner has not had a recent urine test. Jacob Warner does not have any flank pain. Jacob Warner has had no fevers chills or night sweats. His appetite is good otherwise she's doing okay. This seemed to have started a few weeks ago without any known issue that has progressively just become worse. Jacob Warner is ambulating with a cane and is usually someone is fairly mobile when we've seen him. I haven't seen him since Jacob Warner is having the 83 stimulator implanted. We did perform the trial. Jacob Warner has not noted any swelling or induration or pain around the IPG site.    Review of Systems  Constitutional: Negative for chills, fever, malaise/fatigue and weight loss.  HENT: Negative for hearing loss and sinus pain.   Eyes: Negative for blurred vision, double vision and photophobia.  Respiratory: Negative for cough and shortness of breath.   Cardiovascular: Negative for chest pain, palpitations and leg swelling.  Gastrointestinal: Negative for abdominal pain, nausea and vomiting.  Genitourinary: Positive for frequency. Negative for flank pain and urgency.  Musculoskeletal: Positive for back pain. Negative for myalgias.  Skin: Negative for itching and rash.  Neurological: Positive for tingling. Negative for tremors, focal weakness and weakness.  Endo/Heme/Allergies: Negative.   Psychiatric/Behavioral: Negative for depression.  All other systems reviewed and are negative.  Otherwise per HPI.  Assessment & Plan: Visit Diagnoses:  1. Chronic left-sided low back pain, with sciatica presence unspecified   2. Spinal cord stimulator status   3. Acute left-sided low back pain without sciatica   4. Myofascitis   5. Post laminectomy syndrome     Plan: Findings:  Clinical picture and exam seemed to be more related to his problem with stenosis but may be myofascial pain as well. Jacob Warner does have tenderness over the quadratus lumborum and taut band noted in this area that does reproduce some of his pain.  Jacob Warner does have a deeper pain to the pelvic area and buttock over the PSIS. Jacob Warner has no pain over the greater trochanter no pain with hip rotation and Jacob Warner has good hip x-ray showing very mild degenerative change. I think the symptoms are probably twofold at maybe a flareup of his stenosis NML for L5 radicular pain just to the buttock area. It could also be trigger point pain and myofascial pain or a combination of the 2. I'll think there is anything sinister.  At this point I want to give him a local injection of Kenalog with a small bit of Toradol into the quadratus lumborum with needling using a trigger point technique. If Jacob Warner doesn't get better with this over the weekend and they are going to use ice and gentle stretching and movement that I want him to try to see Dr. Maryjean Ka next week as I will be out of the office. My assistant Loma Sousa as well as Erlene Quan are aware of the situation as are the patient and his wife. If Jacob Warner is getting worsening symptom into the buttock Dr. Maryjean Ka can evaluate him appropriately but may wish to perform epidural injection at that point. I don't think there is any need for any advanced imaging but I would leave that up to Dr. Maryjean Ka at that point. Jacob Warner does have a small bit of Vicodin at home that Jacob Warner can use. I've asked him to use a lot of ice around the area as well.    Meds & Orders: No orders of the defined types were placed in this encounter.   Orders Placed This Encounter  Procedures  . Large Joint Injection/Arthrocentesis  . Medium Joint Injection/Arthrocentesis  . Large Joint Injection/Arthrocentesis  . XR Lumbar Spine 2-3 Views  . XR Thoracic Spine 2 View    Follow-up: No Follow-up on file.   Procedures: Muscle tendon junction injection at the quadratus lumborum Date/Time: 06/26/2017 3:19 PM Performed by: Magnus Sinning Authorized by: Magnus Sinning   Consent Given by:  Patient Site marked: the procedure site was marked   Timeout: prior to procedure the correct patient, procedure, and site was verified   Indications:  Pain Location:  Hip Hip joint: Muscle tendon insertion at the quadratus lumborum. Prep: patient was prepped and draped in usual sterile fashion   Ultrasound Guidance: No   Fluoroscopic Guidance: No   Arthrogram: No   Medications:  3 mL lidocaine 1 %; 40 mg triamcinolone acetonide 40 MG/ML Aspiration Attempted: No   Patient tolerance:  Patient tolerated the procedure well with no  immediate complications  Area is palpated to locate the area of maximum tenderness and taut band where the quadratus lumborum attaches to the iliac sacrum.    No notes on file   Clinical History: Lumbar CT myelogram 04/19/2015 IMPRESSION: 1. Superior endplate fracture at L1 was a vacuum disc at T12-L1. 2. Mild to moderate left subarticular and mild left foraminal stenosis at L1-2. 3. Moderate left and mild right subarticular stenosis at L2-3. There is mild foraminal narrowing at this level bilaterally. 4. Moderate subarticular and mild to moderate foraminal stenosis bilaterally at L3-4. 5. Mild subarticular and moderate foraminal stenosis at L4-5 is worse on the right. 6. Advanced facet hypertrophy and spurring leads to mild to moderate right and mild left foraminal stenosis. The central canal is patent.  Jacob Warner reports that Jacob Warner has never smoked. Jacob Warner has never used smokeless tobacco. No results for input(s): HGBA1C, LABURIC in the last 8760 hours.  Objective:  VS:  HT:    WT:   BMI:     BP:114/63  HR:70bpm  TEMP: ( )  RESP:  Physical Exam  Constitutional: Jacob Warner is oriented to person, place, and time. Jacob Warner appears well-developed and well-nourished. No distress.  HENT:  Head: Normocephalic and atraumatic.  Eyes: Conjunctivae are normal. Pupils are equal, round, and reactive to light.  Neck: Normal range of motion. Neck supple.  Cardiovascular: Regular rhythm and intact distal pulses.   Pulmonary/Chest: Effort normal. No respiratory distress.  Musculoskeletal:  Patient is very slow to rise from a seated position and does ambulate with a forward flexed spine. Jacob Warner does have pain over the PSIS and quadratus lumborum area with taut band tenderness. Jacob Warner has no pain with hip rotation internal or external. Jacob Warner has good distal strength without any deficits bilaterally. Jacob Warner is ambulating with a cane. Jacob Warner has difficulty rising from a seated position.  Neurological: Jacob Warner is alert and oriented to person, place,  and time.  Skin: Skin is warm and dry. No rash noted. No erythema.  Psychiatric: Jacob Warner has a normal mood and affect.  Nursing note and vitals reviewed.   Ortho Exam Imaging: Xr Thoracic Spine 2 View  Result Date: 06/26/2017 AP and lateral thoracic x-ray reveals similar placement of 2 spinal cord stimulator leads in the epidural space with the topmost leads at the upper part of the T6 vertebra. There has been no real migration from the prior x-ray in April. There are no new vertebral fractures.  Xr Lumbar Spine 2-3 Views  Result Date: 06/26/2017 2 views of the lumbar spine show bilateral hips with mild degenerative change and there is some rotatory scoliosis of the lumbar spine centered at about L3. There is diffuse facet arthropathy. Imaging does show the IPG without surrounding effusion or air pockets. There are no new fractures or dislocations.   Past Medical/Family/Surgical/Social History: Medications & Allergies reviewed per EMR Patient Active Problem List   Diagnosis Date Noted  . Post laminectomy syndrome 03/19/2017  . Chronic pain syndrome 03/19/2017  . Chronic bilateral low back pain without sciatica 03/19/2017  . Bradycardia 05/31/2015  . Pacemaker 05/31/2015  . Peripheral edema 05/31/2015  . Heart murmur 05/31/2015  . Sick sinus syndrome ([place]) 05/31/2015  . Nonsustained ventricular tachycardia ([place]) 05/31/2015  . 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 A-fib ([place])   . Alopecia   . Anemia   . Bradycardia   . Cancer ([place])   . Cherry angioma   . GERD (gastroesophageal reflux disease)   . Glaucoma   . Hypertension   . Pelvic fracture ([place])   . Right BBB/left ant fasc block   . Sick sinus syndrome ([place])   . TIA (transient ischemic attack)   . Xeroderma    Family History  Problem Relation Age of Onset  . Brain cancer Sister    Past Surgical History:  Procedure Laterality Date  . APPENDECTOMY    . CERVICAL SPINE SURGERY      . EXPLORATORY LAPAROTOMY    . LITHOTRIPSY    . PACEMAKER PLACEMENT     Social History   Occupational History  . Not on file.   Social History Main Topics  . Smoking status: Never Smoker  . Smokeless tobacco: Never Used  . Alcohol use No  . Drug use: No  . Sexual activity: Not on file

## 2017-07-08 ENCOUNTER — Ambulatory Visit (HOSPITAL_BASED_OUTPATIENT_CLINIC_OR_DEPARTMENT_OTHER): Payer: Medicare Other | Admitting: Internal Medicine

## 2017-07-08 ENCOUNTER — Encounter: Payer: Self-pay | Admitting: Internal Medicine

## 2017-07-08 ENCOUNTER — Other Ambulatory Visit (HOSPITAL_BASED_OUTPATIENT_CLINIC_OR_DEPARTMENT_OTHER): Payer: Medicare Other

## 2017-07-08 VITALS — BP 143/66 | HR 84 | Temp 97.7°F | Resp 17 | Ht 70.0 in | Wt 215.4 lb

## 2017-07-08 DIAGNOSIS — M545 Low back pain: Secondary | ICD-10-CM | POA: Diagnosis not present

## 2017-07-08 DIAGNOSIS — D649 Anemia, unspecified: Secondary | ICD-10-CM

## 2017-07-08 DIAGNOSIS — C8203 Follicular lymphoma grade I, intra-abdominal lymph nodes: Secondary | ICD-10-CM | POA: Diagnosis not present

## 2017-07-08 DIAGNOSIS — G8929 Other chronic pain: Secondary | ICD-10-CM

## 2017-07-08 DIAGNOSIS — C8298 Follicular lymphoma, unspecified, lymph nodes of multiple sites: Secondary | ICD-10-CM

## 2017-07-08 LAB — COMPREHENSIVE METABOLIC PANEL
ALT: 24 U/L (ref 0–55)
AST: 19 U/L (ref 5–34)
Albumin: 4 g/dL (ref 3.5–5.0)
Alkaline Phosphatase: 41 U/L (ref 40–150)
Anion Gap: 7 mEq/L (ref 3–11)
BUN: 20.2 mg/dL (ref 7.0–26.0)
CO2: 26 mEq/L (ref 22–29)
Calcium: 9.3 mg/dL (ref 8.4–10.4)
Chloride: 97 mEq/L — ABNORMAL LOW (ref 98–109)
Creatinine: 1.2 mg/dL (ref 0.7–1.3)
EGFR: 54 mL/min/{1.73_m2} — ABNORMAL LOW (ref >90)
Glucose: 95 mg/dl (ref 70–140)
Potassium: 4.8 mEq/L (ref 3.5–5.1)
Sodium: 131 mEq/L — ABNORMAL LOW (ref 136–145)
Total Bilirubin: 0.47 mg/dL (ref 0.20–1.20)
Total Protein: 6.4 g/dL (ref 6.4–8.3)

## 2017-07-08 LAB — CBC WITH DIFFERENTIAL/PLATELET
BASO%: 0.7 % (ref 0.0–2.0)
Basophils Absolute: 0 10*3/uL (ref 0.0–0.1)
EOS%: 1.9 % (ref 0.0–7.0)
Eosinophils Absolute: 0.1 10*3/uL (ref 0.0–0.5)
HCT: 35.6 % — ABNORMAL LOW (ref 38.4–49.9)
HGB: 12.2 g/dL — ABNORMAL LOW (ref 13.0–17.1)
LYMPH%: 13.4 % — ABNORMAL LOW (ref 14.0–49.0)
MCH: 33 pg (ref 27.2–33.4)
MCHC: 34.3 g/dL (ref 32.0–36.0)
MCV: 96.2 fL (ref 79.3–98.0)
MONO#: 0.5 10*3/uL (ref 0.1–0.9)
MONO%: 8.8 % (ref 0.0–14.0)
NEUT#: 4.6 10*3/uL (ref 1.5–6.5)
NEUT%: 75.2 % — ABNORMAL HIGH (ref 39.0–75.0)
Platelets: 185 10*3/uL (ref 140–400)
RBC: 3.7 10*6/uL — ABNORMAL LOW (ref 4.20–5.82)
RDW: 13.9 % (ref 11.0–14.6)
WBC: 6.1 10*3/uL (ref 4.0–10.3)
lymph#: 0.8 10*3/uL — ABNORMAL LOW (ref 0.9–3.3)

## 2017-07-08 LAB — LACTATE DEHYDROGENASE: LDH: 200 U/L (ref 125–245)

## 2017-07-08 NOTE — Progress Notes (Signed)
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  PRIOR THERAPY: A course of Rituxan, Cytoxan and prednisone between 05/20/2007 through 10/20/2007 under the care of Dr. Rockney Ghee.  CURRENT THERAPY: Observation.  INTERVAL HISTORY: Jacob Warner 83 y.o. male returns to the clinic today for follow-up visit accompanied by his wife. The patient is feeling fine today with no specific complaints except for the chronic low back pain. He denied having any recent weight loss or night sweats. He has no nausea, vomiting, diarrhea or constipation. He has no palpable lymphadenopathy. He has been on observation for around 10 years. His ears today for evaluation and repeat blood work.   MEDICAL HISTORY: Past Medical History:  Diagnosis Date  . A-fib ([place])   . Alopecia   . Anemia   . Bradycardia   . Cancer ([place])   . Cherry angioma   . GERD (gastroesophageal reflux disease)   . Glaucoma   . Hypertension   . Pelvic fracture ([place])   . Right BBB/left ant fasc block   . Sick sinus syndrome ([place])   . TIA (transient ischemic attack)   . Xeroderma     ALLERGIES:  is allergic to sulfa antibiotics and sulfacetamide sodium.  MEDICATIONS:  Current Outpatient Prescriptions  Medication Sig Dispense Refill  . acetaminophen (TYLENOL) 500 MG tablet Take 1,000 mg by mouth every 6 (six) hours as needed (pain).     Marland Kitchen amiodarone (PACERONE) 200 MG tablet TAKE 1 TABLET (200 MG TOTAL) BY MOUTH DAILY. 90 tablet 1  . aspirin 81 MG tablet Take 81 mg by mouth daily.    . Cyanocobalamin (VITAMIN B-12 PO) Take 1 tablet by mouth daily.    Marland Kitchen doxazosin (CARDURA) 4 MG tablet Take 4 mg by mouth at bedtime.    . dutasteride (AVODART) 0.5 MG capsule  Take 0.5 mg by mouth daily.    Marland Kitchen gabapentin (NEURONTIN) 300 MG capsule Take 300 mg by mouth every morning.     . lactulose (CHRONULAC) 10 GM/15ML solution Take 10 g by mouth 2 (two) times daily as needed for mild constipation.   0  . latanoprost (XALATAN) 0.005 % ophthalmic solution PLACE 1 DROP INTO BOTH EYES AT BEDTIME  11  . magnesium oxide (MAG-OX) 400 (241.3 MG) MG tablet Take 1 tablet by mouth 2 (two) times daily.  2  . RESTASIS 0.05 % ophthalmic emulsion PLACE 1 DROP INTO BOTH EYES 2 TIMES DAILY.  11  . traMADol (ULTRAM) 50 MG tablet Take 1 tablet (50 mg total) by mouth every 6 (six) hours as needed for moderate pain. 40 tablet 0  . Vitamin D, Ergocalciferol, (DRISDOL) 50000 units CAPS capsule Take 1 capsule by mouth once a week. Takes on Tuesdays     No current facility-administered medications for this visit.     SURGICAL HISTORY:  Past Surgical History:  Procedure Laterality Date  . APPENDECTOMY    . CERVICAL SPINE SURGERY    . EXPLORATORY LAPAROTOMY    . LITHOTRIPSY    . PACEMAKER PLACEMENT      REVIEW OF SYSTEMS:  A comprehensive review of systems was negative except for: Musculoskeletal: positive for back pain   PHYSICAL EXAMINATION: General appearance: alert, cooperative and no distress Head: Normocephalic,  without obvious abnormality, atraumatic Neck: no adenopathy, no JVD, supple, symmetrical, trachea midline and thyroid not enlarged, symmetric, no tenderness/mass/nodules Lymph nodes: Cervical, supraclavicular, and axillary nodes normal. Resp: clear to auscultation bilaterally Back: symmetric, no curvature. ROM normal. No CVA tenderness. Cardio: regular rate and rhythm, S1, S2 normal, no murmur, click, rub or gallop GI: soft, non-tender; bowel sounds normal; no masses,  no organomegaly Extremities: extremities normal, atraumatic, no cyanosis or edema  ECOG PERFORMANCE STATUS: 1 - Symptomatic but completely ambulatory  Blood pressure (!) 143/66, pulse 84,  temperature 97.7 F (36.5 C), temperature source Oral, resp. rate 17, height 5\' 10"  (1.778 m), weight 215 lb 6.4 oz (97.7 kg), SpO2 97 %.  LABORATORY DATA: Lab Results  Component Value Date   WBC 6.1 07/08/2017   HGB 12.2 (L) 07/08/2017   HCT 35.6 (L) 07/08/2017   MCV 96.2 07/08/2017   PLT 185 07/08/2017      Chemistry      Component Value Date/Time   NA 139 01/07/2017 1323   K 4.5 01/07/2017 1323   CL 100 05/31/2015 1158   CO2 28 01/07/2017 1323   BUN 20.1 01/07/2017 1323   CREATININE 1.3 01/07/2017 1323      Component Value Date/Time   CALCIUM 9.6 01/07/2017 1323   ALKPHOS 35 (L) 04/24/2017 1159   ALKPHOS 42 01/07/2017 1323   AST 19 04/24/2017 1159   AST 20 01/07/2017 1323   ALT 18 04/24/2017 1159   ALT 23 01/07/2017 1323   BILITOT 0.4 04/24/2017 1159   BILITOT 0.48 01/07/2017 1323       RADIOGRAPHIC STUDIES: Xr Thoracic Spine 2 View  Result Date: 06/26/2017 AP and lateral thoracic x-ray reveals similar placement of 2 spinal cord stimulator leads in the epidural space with the topmost leads at the upper part of the T6 vertebra. There has been no real migration from the prior x-ray in April. There are no new vertebral fractures.  Xr Lumbar Spine 2-3 Views  Result Date: 06/26/2017 2 views of the lumbar spine show bilateral hips with mild degenerative change and there is some rotatory scoliosis of the lumbar spine centered at about L3. There is diffuse facet arthropathy. Imaging does show the IPG without surrounding effusion or air pockets. There are no new fractures or dislocations.   ASSESSMENT AND PLAN:  This is a very pleasant 83 years old white male with history of follicular lymphoma diagnosed in May 2008 status post systemic chemotherapy with Rituxan/CVP completed in October 2008 in [place] and the patient has been observation since that time. He is doing fine and no significant concerning complaints. His CBC today is unremarkable except for  mild anemia. I recommended for the patient to continue on observation and routine follow-up visit by his primary care physician. I will see him on as-needed basis at this point. He was advised to call if he has any concerning findings. The patient voices understanding of current disease status and treatment options and is in agreement with the current care plan. All questions were answered. The patient knows to call the clinic with any problems, questions or concerns. We can certainly see the patient much sooner if necessary. I spent 10 minutes counseling the patient face to face. The total time spent in the appointment was 15 minutes.  Disclaimer: This note was dictated with voice recognition software. Similar sounding words can inadvertently be transcribed and may not be corrected upon review.

## 2017-07-13 ENCOUNTER — Telehealth (INDEPENDENT_AMBULATORY_CARE_PROVIDER_SITE_OTHER): Payer: Self-pay | Admitting: Physical Medicine and Rehabilitation

## 2017-07-13 DIAGNOSIS — M7918 Myalgia, other site: Secondary | ICD-10-CM

## 2017-07-13 DIAGNOSIS — M545 Low back pain, unspecified: Secondary | ICD-10-CM

## 2017-07-13 DIAGNOSIS — M47816 Spondylosis without myelopathy or radiculopathy, lumbar region: Secondary | ICD-10-CM

## 2017-07-13 NOTE — Telephone Encounter (Signed)
We need to get him in with [facility] or [facility] at [place] for dry needling/manual tx for just a few sessions, ask them which one is more convenient but would like one of those two if possible - send back to me so we can put in referral

## 2017-07-13 NOTE — Telephone Encounter (Signed)
[facility] at [facility] is most convenient.

## 2017-07-14 NOTE — Telephone Encounter (Signed)
PT order placed.

## 2017-07-15 ENCOUNTER — Ambulatory Visit: Payer: Medicare Other | Attending: Physical Medicine and Rehabilitation | Admitting: Physical Therapy

## 2017-07-15 ENCOUNTER — Encounter: Payer: Self-pay | Admitting: Physical Therapy

## 2017-07-15 DIAGNOSIS — G8929 Other chronic pain: Secondary | ICD-10-CM | POA: Insufficient documentation

## 2017-07-15 DIAGNOSIS — M545 Low back pain: Secondary | ICD-10-CM | POA: Insufficient documentation

## 2017-07-15 DIAGNOSIS — M6281 Muscle weakness (generalized): Secondary | ICD-10-CM | POA: Diagnosis present

## 2017-07-15 DIAGNOSIS — R262 Difficulty in walking, not elsewhere classified: Secondary | ICD-10-CM | POA: Insufficient documentation

## 2017-07-15 NOTE — Patient Instructions (Signed)
Knee-to-Chest Stretch: Unilateral    With hand behind right knee, pull knee in to chest until a comfortable stretch is felt in lower back and buttocks. Keep back relaxed. Hold __10__ seconds. Repeat __5__ times per set. Do __1__ sets per session. Do __1__ sessions per day.  http://orth.exer.us/126   Copyright  VHI. All rights reserved.   Chair Sitting    Sit at edge of seat, spine straight, one leg extended. Put a hand on each thigh and bend forward from the hip, keeping spine straight. Allow hand on extended leg to reach toward toes. Support upper body with other arm. Hold _10__ seconds. Repeat _5__ times per session. Do _1__ sessions per day.  Copyright  VHI. All rights reserved.

## 2017-07-15 NOTE — Therapy (Addendum)
[facility] [address] [place], [place], [address] Phone: [phone]   Fax:  [phone]  Physical Therapy Evaluation  Patient Details  Name: Jacob Warner MRN: [id number] Date of Birth: 23-Dec-1924 Referring Provider: Magnus Sinning, MD  Encounter Date: 07/15/2017      PT End of Session - 07/15/17 1324    Visit Number 1   Number of Visits 10   Date for PT Re-Evaluation 09/09/17   Authorization Type medicare gcodes at 10 visits   PT Start Time 1238   PT Stop Time 1315   PT Time Calculation (min) 37 min   Activity Tolerance Patient tolerated treatment well   Behavior During Therapy [facility] for tasks assessed/performed      Past Medical History:  Diagnosis Date  . A-fib ([place])   . Alopecia   . Anemia   . Bradycardia   . Cancer ([place])   . Cherry angioma   . GERD (gastroesophageal reflux disease)   . Glaucoma   . Hypertension   . Pelvic fracture ([place])   . Right BBB/left ant fasc block   . Sick sinus syndrome ([place])   . TIA (transient ischemic attack)   . Xeroderma     Past Surgical History:  Procedure Laterality Date  . APPENDECTOMY    . CERVICAL SPINE SURGERY    . EXPLORATORY LAPAROTOMY    . LITHOTRIPSY    . PACEMAKER PLACEMENT      There were no vitals filed for this visit.       Subjective Assessment - 07/15/17 1240    Subjective Fell in 2015 and had multiple breaks in pelvis.  I have a stimulator in the back.     Limitations Standing;Walking   How long can you stand comfortably? 5 minutes   Patient Stated Goals walk more and not bending over as much   Currently in Pain? Yes   Pain Score 9   10/10 when walking   Pain Location Back   Pain Orientation Left   Pain Descriptors / Indicators Aching   Pain Type Chronic pain   Pain Radiating Towards legs are numb from surgery   Pain Onset More than a month ago   Pain Frequency Constant   Aggravating Factors  standing and walking   Pain  Relieving Factors sitting, lying down   Multiple Pain Sites No            [facility] PT Assessment - 07/15/17 0001      Assessment   Medical Diagnosis M54.5 (ICD-10-CM) - Acute left-sided low back pain without sciatica   Referring Provider Magnus Sinning, MD   Onset Date/Surgical Date --  2015 fall   Prior Therapy Yes after surgery     Precautions   Precautions Fall     Restrictions   Weight Bearing Restrictions No     Balance Screen   Has the patient fallen in the past 6 months No     [place] residence   Living Arrangements Spouse/significant other     Prior Function   Level of Independence Independent with basic ADLs;Independent with household mobility with device     Observation/Other Assessments   Focus on Therapeutic Outcomes (FOTO)  68% limited     Posture/Postural Control   Posture/Postural Control Postural limitations   Postural Limitations Forward head;Rounded Shoulders;Increased thoracic kyphosis     ROM / Strength   AROM / PROM / Strength Strength  Strength   Overall Strength Comments bilateral LE 4-/5     Flexibility   Soft Tissue Assessment /Muscle Length yes   Hamstrings limited   Quadriceps limited   ITB limited     Palpation   Palpation comment tightness in left glutes and lumbar paraspinals     Ambulation/Gait   Assistive device Straight cane   Gait Pattern Lateral trunk lean to right  scissoring left LE            Objective measurements completed on examination: See above findings.          [facility] Adult PT Treatment/Exercise - 07/15/17 0001      Exercises   Exercises Other Exercises   Other Exercises  lumbar stretch knee to chest, hamstring stretch bilateral in sitting     Manual Therapy   Manual Therapy Soft tissue mobilization   Manual therapy comments lumbar parapsinals and glutes left                  PT Short Term Goals - 07/15/17 1332      PT SHORT TERM GOAL  #1   Title demonstrates improved gait with more even stride and less lateral lean to the right side   Time 4   Period Weeks   Status New     PT SHORT TERM GOAL #2   Title ind with initial HEP   Time 4   Period Weeks   Status New     PT SHORT TERM GOAL #3   Title reports able to have some reduction in pain with stretches   Time 4   Period Weeks   Status New           PT Long Term Goals - 07/15/17 1334      PT LONG TERM GOAL #1   Title ind with advanced HEP   Time 8   Period Weeks   Status New     PT LONG TERM GOAL #2   Title FOTO < or = to 59%   Time 8   Period Weeks   Status New     PT LONG TERM GOAL #3   Title reports 50% less pain when standing and walking 5 minutes   Time 8   Period Weeks   Status New     PT LONG TERM GOAL #4   Title able to stand and walk at lease 8 minutes with LRAD due to increased strength and endurance   Time 8   Period Weeks   Status New                Plan - 07/15/17 1325    Clinical Impression Statement Pt was seen primarily for low back pain today.  Pt had trigger  point injection which resolved pain for 7 days and states the doctor feels like dry needling may help.  Pt walks in with gait deviations as stated above and uses single point cane.  Pt has tightness in lumbar and glute muscles.  Overall weakness of bilateral LE of 4-/5.  Pt has forward flexed posture with rounded shoulders and forwrad head.  Pt will benefit from skilled PT to address these impairments.   History and Personal Factors relevant to plan of care: back surgery, chronic low back pain   Clinical Presentation Evolving   Clinical Presentation due to: reports it is worsening   Clinical Decision Making Low   Rehab Potential Good   Clinical Impairments Affecting Rehab Potential chronic pain  PT Frequency 2x / week   PT Duration 8 weeks   PT Next Visit Plan manual to lumbar and glutes, TUG to assess balance, dry needling, stretches   Consulted and Agree  with Plan of Care Patient      Patient will benefit from skilled therapeutic intervention in order to improve the following deficits and impairments:  Abnormal gait, Decreased activity tolerance, Decreased balance, Decreased range of motion, Decreased mobility, Difficulty walking, Decreased strength, Increased fascial restricitons, Increased muscle spasms, Pain, Postural dysfunction  Visit Diagnosis: Chronic left-sided low back pain, with sciatica presence unspecified - Plan: PT plan of care cert/re-cert  Muscle weakness (generalized) - Plan: PT plan of care cert/re-cert  Difficulty in walking, not elsewhere classified - Plan: PT plan of care cert/re-cert  gcodes: walking Current: CL Discharge: CK   Problem List Patient Active Problem List   Diagnosis Date Noted  . Post laminectomy syndrome 03/19/2017  . Chronic pain syndrome 03/19/2017  . Chronic bilateral low back pain without sciatica 03/19/2017  . Bradycardia 05/31/2015  . Pacemaker 05/31/2015  . Peripheral edema 05/31/2015  . Heart murmur 05/31/2015  . Sick sinus syndrome ([place]) 05/31/2015  . Nonsustained ventricular tachycardia ([place]) 05/31/2015  . Follicular lymphoma ([place]) 04/02/2015    Zannie Cove, PT 07/15/2017, 1:39 PM  [facility] [address], [place], [place], [address] Phone: [phone]   Fax:  [phone]  Name: KRIS NO MRN: [id number] Date of Birth: 11-14-1924

## 2017-07-16 ENCOUNTER — Other Ambulatory Visit (INDEPENDENT_AMBULATORY_CARE_PROVIDER_SITE_OTHER): Payer: Self-pay | Admitting: Specialist

## 2017-07-16 NOTE — Telephone Encounter (Signed)
Gabapentin refill request 

## 2017-07-30 ENCOUNTER — Ambulatory Visit: Payer: Medicare Other | Attending: Physical Medicine and Rehabilitation

## 2017-07-30 DIAGNOSIS — R262 Difficulty in walking, not elsewhere classified: Secondary | ICD-10-CM

## 2017-07-30 DIAGNOSIS — M545 Low back pain: Secondary | ICD-10-CM | POA: Diagnosis not present

## 2017-07-30 DIAGNOSIS — G8929 Other chronic pain: Secondary | ICD-10-CM | POA: Diagnosis present

## 2017-07-30 DIAGNOSIS — M6281 Muscle weakness (generalized): Secondary | ICD-10-CM | POA: Insufficient documentation

## 2017-07-30 NOTE — Patient Instructions (Addendum)

## 2017-07-30 NOTE — Therapy (Signed)
[facility] [address] [place], [place], [address] Phone: [phone]   Fax:  [phone]  Physical Therapy Treatment  Patient Details  Name: Jacob Warner MRN: [id number] Date of Birth: Jul 26, 1924 Referring Provider: Magnus Sinning, MD  Encounter Date: 07/30/2017      PT End of Session - 07/30/17 1017    Visit Number 2   Number of Visits 10   Date for PT Re-Evaluation 09/09/17   Authorization Type medicare gcodes at 10 visits   PT Start Time 0932   PT Stop Time 1025  dry needling   PT Time Calculation (min) 53 min   Activity Tolerance Patient tolerated treatment well   Behavior During Therapy Jacob Warner for tasks assessed/performed      Past Medical History:  Diagnosis Date  . A-fib ([place])   . Alopecia   . Anemia   . Bradycardia   . Cancer ([place])   . Cherry angioma   . GERD (gastroesophageal reflux disease)   . Glaucoma   . Hypertension   . Pelvic fracture ([place])   . Right BBB/left ant fasc block   . Sick sinus syndrome ([place])   . TIA (transient ischemic attack)   . Xeroderma     Past Surgical History:  Procedure Laterality Date  . APPENDECTOMY    . CERVICAL SPINE SURGERY    . EXPLORATORY LAPAROTOMY    . LITHOTRIPSY    . PACEMAKER PLACEMENT      There were no vitals filed for this visit.      Subjective Assessment - 07/30/17 0935    Subjective Ready to try dry needling.     Currently in Pain? Yes   Pain Score 8    Pain Location Back   Pain Orientation Left   Pain Descriptors / Indicators Aching   Pain Type Chronic pain   Pain Onset More than a month ago   Pain Frequency Constant   Aggravating Factors  standing and walking   Pain Relieving Factors sitting down                         [facility] Adult PT Treatment/Exercise - 07/30/17 0001      Modalities   Modalities Moist Heat     Moist Heat Therapy   Number Minutes Moist Heat 12 Minutes   Moist Heat Location Lumbar  Spine     Manual Therapy   Manual Therapy Soft tissue mobilization   Manual therapy comments lumbar parapsinals and bil gluteals          Trigger Point Dry Needling - 07/30/17 0940    Consent Given? Yes   Muscles Treated Lower Body Gluteus minimus;Gluteus maximus   Gluteus Maximus Response Twitch response elicited;Palpable increased muscle length   Gluteus Minimus Response Palpable increased muscle length;Twitch response elicited              PT Education - 07/30/17 1009    Education provided Yes   Education Details DN info   Person(s) Educated Patient;Spouse   Methods Explanation;Handout   Comprehension Verbalized understanding          PT Short Term Goals - 07/30/17 0938      PT SHORT TERM GOAL #1   Title demonstrates improved gait with more even stride and less lateral lean to the right side   Time 4   Period Weeks   Status On-going     PT SHORT TERM GOAL #2  Time 4   Period Weeks   Status On-going     PT SHORT TERM GOAL #3   Title reports able to have some reduction in pain with stretches   Time 4   Period Weeks   Status On-going           PT Long Term Goals - 07/15/17 1334      PT LONG TERM GOAL #1   Title ind with advanced HEP   Time 8   Period Weeks   Status New     PT LONG TERM GOAL #2   Title FOTO < or = to 59%   Time 8   Period Weeks   Status New     PT LONG TERM GOAL #3   Title reports 50% less pain when standing and walking 5 minutes   Time 8   Period Weeks   Status New     PT LONG TERM GOAL #4   Title able to stand and walk at lease 8 minutes with LRAD due to increased strength and endurance   Time 8   Period Weeks   Status New               Plan - 07/30/17 1014    Clinical Impression Statement Pt with one session after evaluation.  Pt has been performing HEP for flexibility.  PT perfomed dry needling to bil lumbar paraspinals and bil gluteals today and soft tissue to elongate the Lt>Rt lumbar paraspinals and  gluteals.  Pt with improved tissue mobility over lumbar paraspinals after needling today.  Pt will benefit from skilled PT for core strength, hip and lumbar flexibility and dry needling and manual as helpful.     Rehab Potential Good   PT Frequency 2x / week   PT Duration 8 weeks   PT Treatment/Interventions ADLs/Self Care Home Management;Cryotherapy;Electrical Stimulation;Functional mobility training;Stair training;Gait training;Moist Heat;Therapeutic activities;Therapeutic exercise;Patient/family education;Passive range of motion;Manual techniques;Dry needling;Taping   PT Next Visit Plan manual to lumbar and glutes, TUG to assess balance, dry needling, stretches   Recommended Other Services initial plan of care is signed   Consulted and Agree with Plan of Care Patient;Family member/caregiver   Family Member Consulted Pt's wife      Patient will benefit from skilled therapeutic intervention in order to improve the following deficits and impairments:  Abnormal gait, Decreased activity tolerance, Decreased balance, Decreased range of motion, Decreased mobility, Difficulty walking, Decreased strength, Increased fascial restricitons, Increased muscle spasms, Pain, Postural dysfunction  Visit Diagnosis: Chronic left-sided low back pain, with sciatica presence unspecified  Muscle weakness (generalized)  Difficulty in walking, not elsewhere classified     Problem List Patient Active Problem List   Diagnosis Date Noted  . Post laminectomy syndrome 03/19/2017  . Chronic pain syndrome 03/19/2017  . Chronic bilateral low back pain without sciatica 03/19/2017  . Bradycardia 05/31/2015  . Pacemaker 05/31/2015  . Peripheral edema 05/31/2015  . Heart murmur 05/31/2015  . Sick sinus syndrome ([place]) 05/31/2015  . Nonsustained ventricular tachycardia ([place]) 05/31/2015  . Follicular lymphoma ([place]) 04/02/2015     Jacob Warner, PT 07/30/17 10:19 AM  [facility] [address], [place], [place], [address] Phone: [phone]   Fax:  [phone]  Name: Jacob Warner MRN: [id number] Date of Birth: [birth year]

## 2017-08-03 ENCOUNTER — Ambulatory Visit: Payer: Medicare Other | Admitting: Rehabilitative and Restorative Service Providers"

## 2017-08-03 DIAGNOSIS — G8929 Other chronic pain: Secondary | ICD-10-CM

## 2017-08-03 DIAGNOSIS — M6281 Muscle weakness (generalized): Secondary | ICD-10-CM

## 2017-08-03 DIAGNOSIS — M545 Low back pain: Principal | ICD-10-CM

## 2017-08-03 DIAGNOSIS — R262 Difficulty in walking, not elsewhere classified: Secondary | ICD-10-CM

## 2017-08-03 NOTE — Therapy (Signed)
[facility] [address], [place], [place], [address] Phone: [phone]   Fax:  [phone]  Physical Therapy Treatment  Patient Details  Name: Jacob Warner MRN: [id number] Date of Birth: May 06, 1924 Referring Provider: Magnus Sinning, MD  Encounter Date: 08/03/2017      PT End of Session - 08/03/17 1036    Visit Number 3   Number of Visits 10   Date for PT Re-Evaluation 09/09/17   Authorization Type medicare gcodes at 10 visits   PT Start Time 1019   PT Stop Time 1100   PT Time Calculation (min) 41 min   Activity Tolerance Patient tolerated treatment well;No increased pain   Behavior During Therapy WFL for tasks assessed/performed      Past Medical History:  Diagnosis Date  . A-fib ([place])   . Alopecia   . Anemia   . Bradycardia   . Cancer ([place])   . Cherry angioma   . GERD (gastroesophageal reflux disease)   . Glaucoma   . Hypertension   . Pelvic fracture ([place])   . Right BBB/left ant fasc block   . Sick sinus syndrome ([place])   . TIA (transient ischemic attack)   . Xeroderma     Past Surgical History:  Procedure Laterality Date  . APPENDECTOMY    . CERVICAL SPINE SURGERY    . EXPLORATORY LAPAROTOMY    . LITHOTRIPSY    . PACEMAKER PLACEMENT      There were no vitals filed for this visit.      Subjective Assessment - 08/03/17 1023    Subjective 8/10 pain; dry needling didn't make it any worse; not sure if it helped. Pain went from my butt to my leg last time after the dry needling   Limitations Standing;Walking   How long can you stand comfortably? 5 minutes-10 min    How long can you walk comfortably? 5-10 min   Patient Stated Goals walk more and not bending over as much   Currently in Pain? Yes   Pain Score 8    Pain Location Back   Pain Orientation Left   Pain Descriptors / Indicators Aching   Pain Type Chronic pain   Pain Onset More than a month ago   Pain Frequency Constant   Aggravating Factors  standing and walking   Pain Relieving Factors sitting down    Multiple Pain Sites No                         [facility] Adult PT Treatment/Exercise - 08/03/17 0001      Lumbar Exercises: Stretches   Single Knee to Chest Stretch 2 reps;30 seconds;Other (comment)  bil     Lumbar Exercises: Supine   Other Supine Lumbar Exercises axial decompression exercises unilat bil x 10 each; knee to opposite shoulder stretch 2x30 sec; tilt x 15; tilt with march x 15; tilt with clam shell x 15; bridge x 15 with tilt; lower trunk rotation x 5 with 5 sec holds bil     Manual Therapy   Manual Therapy Soft tissue mobilization   Manual therapy comments lumbar paraspinals L                  PT Short Term Goals - 08/03/17 1035      PT SHORT TERM GOAL #1   Title demonstrates improved gait with more even stride and less lateral lean to the right side   Time  4   Period Weeks   Status On-going     PT SHORT TERM GOAL #2   Title ind with initial HEP   Time 4   Period Weeks   Status On-going     PT SHORT TERM GOAL #3   Title reports able to have some reduction in pain with stretches   Time 4   Period Weeks   Status On-going           PT Long Term Goals - 07/15/17 1334      PT LONG TERM GOAL #1   Title ind with advanced HEP   Time 8   Period Weeks   Status New     PT LONG TERM GOAL #2   Title FOTO < or = to 59%   Time 8   Period Weeks   Status New     PT LONG TERM GOAL #3   Title reports 50% less pain when standing and walking 5 minutes   Time 8   Period Weeks   Status New     PT LONG TERM GOAL #4   Title able to stand and walk at lease 8 minutes with LRAD due to increased strength and endurance   Time 8   Period Weeks   Status New               Plan - 08/03/17 1033    Clinical Impression Statement Pt presents to PT with L lumbar tightness and L LBP. Pt would benefit from further PT for bil hip flexibility, lumbar  flexibility and strengthening, glute flexibility with emphasis on L to decrease pain. Pt desires to continue to assess if dry needling will be helpful to LBP management.    Rehab Potential Good   Clinical Impairments Affecting Rehab Potential chronic pain   PT Frequency 2x / week   PT Duration 8 weeks   PT Treatment/Interventions ADLs/Self Care Home Management;Cryotherapy;Electrical Stimulation;Functional mobility training;Stair training;Gait training;Moist Heat;Therapeutic activities;Therapeutic exercise;Patient/family education;Passive range of motion;Manual techniques;Dry needling;Taping   PT Next Visit Plan manual to lumbar and glutes, TUG to assess balance, dry needling, stretches   Consulted and Agree with Plan of Care Patient;Family member/caregiver   Family Member Consulted Pt's wife      Patient will benefit from skilled therapeutic intervention in order to improve the following deficits and impairments:  Abnormal gait, Decreased activity tolerance, Decreased balance, Decreased range of motion, Decreased mobility, Difficulty walking, Decreased strength, Increased fascial restricitons, Increased muscle spasms, Pain, Postural dysfunction  Visit Diagnosis: Chronic left-sided low back pain, with sciatica presence unspecified  Muscle weakness (generalized)  Difficulty in walking, not elsewhere classified     Problem List Patient Active Problem List   Diagnosis Date Noted  . Post laminectomy syndrome 03/19/2017  . Chronic pain syndrome 03/19/2017  . Chronic bilateral low back pain without sciatica 03/19/2017  . Bradycardia 05/31/2015  . Pacemaker 05/31/2015  . Peripheral edema 05/31/2015  . Heart murmur 05/31/2015  . Sick sinus syndrome ([place]) 05/31/2015  . Nonsustained ventricular tachycardia ([place]) 05/31/2015  . Follicular lymphoma ([place]) 04/02/2015    ARTIS,Marizol Borror, PT 08/03/2017, 11:01 AM  [facility] [address],  [place], [place], [address] Phone: [phone]   Fax:  [phone]  Name: Jacob Warner MRN: [id number] Date of Birth: [birth year]

## 2017-08-07 ENCOUNTER — Ambulatory Visit: Payer: Medicare Other | Admitting: Physical Therapy

## 2017-08-07 DIAGNOSIS — M545 Low back pain: Principal | ICD-10-CM

## 2017-08-07 DIAGNOSIS — M6281 Muscle weakness (generalized): Secondary | ICD-10-CM

## 2017-08-07 DIAGNOSIS — G8929 Other chronic pain: Secondary | ICD-10-CM

## 2017-08-07 DIAGNOSIS — R262 Difficulty in walking, not elsewhere classified: Secondary | ICD-10-CM

## 2017-08-07 NOTE — Therapy (Signed)
[facility] [address] [place], [place], [address] Phone: [phone]   Fax:  [phone]  Physical Therapy Treatment  Patient Details  Name: Jacob Warner MRN: [id number] Date of Birth: Feb 04, 1924 Referring Provider: Magnus Sinning, MD  Encounter Date: 08/07/2017      PT End of Session - 08/07/17 0946    Visit Number 4   Number of Visits 10   Date for PT Re-Evaluation 09/09/17   Authorization Type medicare gcodes at 10 visits   PT Start Time 0845   PT Stop Time 0940  dry needling   PT Time Calculation (min) 55 min   Activity Tolerance Patient tolerated treatment well      Past Medical History:  Diagnosis Date  . A-fib ([place])   . Alopecia   . Anemia   . Bradycardia   . Cancer ([place])   . Cherry angioma   . GERD (gastroesophageal reflux disease)   . Glaucoma   . Hypertension   . Pelvic fracture ([place])   . Right BBB/left ant fasc block   . Sick sinus syndrome ([place])   . TIA (transient ischemic attack)   . Xeroderma     Past Surgical History:  Procedure Laterality Date  . APPENDECTOMY    . CERVICAL SPINE SURGERY    . EXPLORATORY LAPAROTOMY    . LITHOTRIPSY    . PACEMAKER PLACEMENT      There were no vitals filed for this visit.      Subjective Assessment - 08/07/17 0852    Subjective Minimal pain with sitting but with standing/walking 8/10.  No change with initial dry needling but patient would like to try it again.     Pertinent History Goes by [name]"   Currently in Pain? Yes   Pain Score 8    Pain Location Back   Pain Orientation Left   Pain Type Chronic pain   Aggravating Factors  standing and walking   Pain Relieving Factors sitting down                         [facility] Adult PT Treatment/Exercise - 08/07/17 0001      Lumbar Exercises: Stretches   Single Knee to Chest Stretch Limitations Discussion of previous HEP      Lumbar Exercises: Aerobic   Stationary  Bike Nu-Step L1 74min  while discussing symptoms and response to treatment     Moist Heat Therapy   Number Minutes Moist Heat 10 Minutes   Moist Heat Location Hip     Manual Therapy   Manual Therapy Joint mobilization   Manual therapy comments lumbar parapsinals and bil gluteals   Joint Mobilization left long axis distraction, inferior mobs, distraction mobs in flexion; anterior/posterior grade 2/3 3x 30 sec each          Trigger Point Dry Needling - 08/07/17 0945    Consent Given? Yes   Muscles Treated Lower Body Gluteus minimus;Gluteus maximus;Piriformis   Gluteus Maximus Response Palpable increased muscle length   Gluteus Minimus Response Palpable increased muscle length   Piriformis Response Palpable increased muscle length       Left only.         PT Short Term Goals - 08/07/17 5361      PT SHORT TERM GOAL #1   Title demonstrates improved gait with more even stride and less lateral lean to the right side   Time 4   Period Weeks  Status On-going     PT SHORT TERM GOAL #2   Title ind with initial HEP   Time 4   Period Weeks   Status On-going     PT SHORT TERM GOAL #3   Title reports able to have some reduction in pain with stretches   Time 4   Period Weeks   Status On-going           PT Long Term Goals - 08/07/17 4268      PT LONG TERM GOAL #1   Title ind with advanced HEP   Time 8   Period Weeks   Status On-going     PT LONG TERM GOAL #2   Title FOTO < or = to 59%   Time 8   Period Weeks   Status On-going     PT LONG TERM GOAL #3   Title reports 50% less pain when standing and walking 5 minutes   Time 8   Period Weeks   Status On-going     PT LONG TERM GOAL #4   Title able to stand and walk at lease 8 minutes with LRAD due to increased strength and endurance   Time 8   Period Weeks   Status On-going               Plan - 08/07/17 0947    Clinical Impression Statement The patient reports no changes in status thus far but  would like to continue with dry needling trial.  Care was taken to avoid stimulator implant and battery.  Tender points noted in left gluteals and piriformis with improved soft tissue length noted during manual therapy.     Rehab Potential Good   Clinical Impairments Affecting Rehab Potential chronic pain;  lumbar pain stimulator   PT Frequency 2x / week   PT Duration 8 weeks   PT Treatment/Interventions ADLs/Self Care Home Management;Cryotherapy;Electrical Stimulation;Functional mobility training;Stair training;Gait training;Moist Heat;Therapeutic activities;Therapeutic exercise;Patient/family education;Passive range of motion;Manual techniques;Dry needling;Taping   PT Next Visit Plan assess response to DN#2;  manual to lumbar and glutes,  hip mobilization;   TUG to assess balance,  stretches especially hamstrings   Consulted and Agree with Plan of Care Patient;Family member/caregiver   Family Member Consulted Pt's wife      Patient will benefit from skilled therapeutic intervention in order to improve the following deficits and impairments:  Abnormal gait, Decreased activity tolerance, Decreased balance, Decreased range of motion, Decreased mobility, Difficulty walking, Decreased strength, Increased fascial restricitons, Increased muscle spasms, Pain, Postural dysfunction  Visit Diagnosis: Chronic left-sided low back pain, with sciatica presence unspecified  Muscle weakness (generalized)  Difficulty in walking, not elsewhere classified     Problem List Patient Active Problem List   Diagnosis Date Noted  . Post laminectomy syndrome 03/19/2017  . Chronic pain syndrome 03/19/2017  . Chronic bilateral low back pain without sciatica 03/19/2017  . Bradycardia 05/31/2015  . Pacemaker 05/31/2015  . Peripheral edema 05/31/2015  . Heart murmur 05/31/2015  . Sick sinus syndrome ([place]) 05/31/2015  . Nonsustained ventricular tachycardia ([place]) 05/31/2015  . Follicular lymphoma ([place]) 04/02/2015    Ruben Im, PT 08/07/17 9:56 AM Phone: [phone] Fax: [phone]  Alvera Singh 08/07/2017, 9:54 AM  [facility] [address] [place], [place], [address] Phone: [phone]   Fax:  [phone]  Name: Jacob Warner MRN: [id number] Date of Birth: [birth year]

## 2017-08-10 ENCOUNTER — Encounter: Payer: Self-pay | Admitting: Physical Therapy

## 2017-08-10 ENCOUNTER — Ambulatory Visit: Payer: Medicare Other | Admitting: Physical Therapy

## 2017-08-10 DIAGNOSIS — M545 Low back pain: Secondary | ICD-10-CM | POA: Diagnosis not present

## 2017-08-10 DIAGNOSIS — R262 Difficulty in walking, not elsewhere classified: Secondary | ICD-10-CM

## 2017-08-10 DIAGNOSIS — G8929 Other chronic pain: Secondary | ICD-10-CM

## 2017-08-10 DIAGNOSIS — M6281 Muscle weakness (generalized): Secondary | ICD-10-CM

## 2017-08-10 NOTE — Therapy (Signed)
[facility] [address] [place], [place], [address] Phone: [phone]   Fax:  [phone]  Physical Therapy Treatment  Patient Details  Name: Jacob Warner MRN: [id number] Date of Birth: 01-10-1924 Referring Provider: Magnus Sinning, MD  Encounter Date: 08/10/2017      PT End of Session - 08/10/17 1101    Visit Number 5   Number of Visits 10   Date for PT Re-Evaluation 09/09/17   Authorization Type medicare gcodes at 10 visits   PT Start Time 1101   PT Stop Time 1150   PT Time Calculation (min) 49 min   Activity Tolerance Patient tolerated treatment well   Behavior During Therapy [facility] for tasks assessed/performed  Pt appears sad today. He reports he is frustrated with his level of pain.       Past Medical History:  Diagnosis Date  . A-fib ([place])   . Alopecia   . Anemia   . Bradycardia   . Cancer ([place])   . Cherry angioma   . GERD (gastroesophageal reflux disease)   . Glaucoma   . Hypertension   . Pelvic fracture ([place])   . Right BBB/left ant fasc block   . Sick sinus syndrome ([place])   . TIA (transient ischemic attack)   . Xeroderma     Past Surgical History:  Procedure Laterality Date  . APPENDECTOMY    . CERVICAL SPINE SURGERY    . EXPLORATORY LAPAROTOMY    . LITHOTRIPSY    . PACEMAKER PLACEMENT      There were no vitals filed for this visit.      Subjective Assessment - 08/10/17 1103    Subjective Nothing is really helping my pain pt reports today. The only thing to help is if I sit down.    Pertinent History Goes by "Bill"   Limitations Standing;Walking   Currently in Pain? Yes   Pain Score 8    Pain Location Back   Pain Orientation Lower   Pain Descriptors / Indicators Sore   Aggravating Factors  standing and walking    Pain Relieving Factors sitting down             [facility] PT Assessment - 08/10/17 0001      Standardized Balance Assessment   Standardized Balance  Assessment Timed Up and Go Test     Timed Up and Go Test   Normal TUG (seconds) 20                     [facility] Adult PT Treatment/Exercise - 08/10/17 0001      Lumbar Exercises: Stretches   Active Hamstring Stretch 3 reps;20 seconds   Single Knee to Chest Stretch 3 reps;20 seconds  bil   Double Knee to Chest Stretch --  seated flexion stretch 3x 3 breaths   Lower Trunk Rotation 3 reps;20 seconds  Including rocking gently to initiate the exercise.     Lumbar Exercises: Aerobic   Stationary Bike Nustep L2 x 10 min  PTA present     Moist Heat Therapy   Number Minutes Moist Heat 15 Minutes  post session   Moist Heat Location Lumbar Spine                  PT Short Term Goals - 08/07/17 4680      PT SHORT TERM GOAL #1   Title demonstrates improved gait with more even stride and less lateral lean to the right  side   Time 4   Period Weeks   Status On-going     PT SHORT TERM GOAL #2   Title ind with initial HEP   Time 4   Period Weeks   Status On-going     PT SHORT TERM GOAL #3   Title reports able to have some reduction in pain with stretches   Time 4   Period Weeks   Status On-going           PT Long Term Goals - 08/07/17 4782      PT LONG TERM GOAL #1   Title ind with advanced HEP   Time 8   Period Weeks   Status On-going     PT LONG TERM GOAL #2   Title FOTO < or = to 59%   Time 8   Period Weeks   Status On-going     PT LONG TERM GOAL #3   Title reports 50% less pain when standing and walking 5 minutes   Time 8   Period Weeks   Status On-going     PT LONG TERM GOAL #4   Title able to stand and walk at lease 8 minutes with LRAD due to increased strength and endurance   Time 8   Period Weeks   Status On-going               Plan - 08/10/17 1101    Clinical Impression Statement Pt appeared down today. Despite having an excellent attitude and willingness to try whatever we asked, he is concerned about the unchanging  nature of his pain. He had no increased pain with any of the flexion based stretches. TUG was 20 seconds without cane.     Rehab Potential Good   Clinical Impairments Affecting Rehab Potential chronic pain;  lumbar pain stimulator   PT Frequency 2x / week   PT Duration 8 weeks   PT Treatment/Interventions ADLs/Self Care Home Management;Cryotherapy;Electrical Stimulation;Functional mobility training;Stair training;Gait training;Moist Heat;Therapeutic activities;Therapeutic exercise;Patient/family education;Passive range of motion;Manual techniques;Dry needling;Taping   PT Next Visit Plan Try supine core stabilizations next.    Consulted and Agree with Plan of Care Patient   Family Member Consulted Pt's wife      Patient will benefit from skilled therapeutic intervention in order to improve the following deficits and impairments:  Abnormal gait, Decreased activity tolerance, Decreased balance, Decreased range of motion, Decreased mobility, Difficulty walking, Decreased strength, Increased fascial restricitons, Increased muscle spasms, Pain, Postural dysfunction  Visit Diagnosis: Chronic left-sided low back pain, with sciatica presence unspecified  Muscle weakness (generalized)  Difficulty in walking, not elsewhere classified     Problem List Patient Active Problem List   Diagnosis Date Noted  . Post laminectomy syndrome 03/19/2017  . Chronic pain syndrome 03/19/2017  . Chronic bilateral low back pain without sciatica 03/19/2017  . Bradycardia 05/31/2015  . Pacemaker 05/31/2015  . Peripheral edema 05/31/2015  . Heart murmur 05/31/2015  . Sick sinus syndrome ([place]) 05/31/2015  . Nonsustained ventricular tachycardia ([place]) 05/31/2015  . Follicular lymphoma ([place]) 04/02/2015    Kallum Jorgensen, PTA 08/10/2017, 11:44 AM  [facility] [address] [place], [place], [address] Phone: [phone]   Fax:   [phone]  Name: TEOMAN GIRAUD MRN: [id number] Date of Birth: [birth year]

## 2017-08-14 ENCOUNTER — Ambulatory Visit: Payer: Medicare Other | Admitting: Physical Therapy

## 2017-08-14 ENCOUNTER — Encounter: Payer: Self-pay | Admitting: Physical Therapy

## 2017-08-14 DIAGNOSIS — M6281 Muscle weakness (generalized): Secondary | ICD-10-CM

## 2017-08-14 DIAGNOSIS — M545 Low back pain: Secondary | ICD-10-CM | POA: Diagnosis not present

## 2017-08-14 DIAGNOSIS — R262 Difficulty in walking, not elsewhere classified: Secondary | ICD-10-CM

## 2017-08-14 DIAGNOSIS — G8929 Other chronic pain: Secondary | ICD-10-CM

## 2017-08-14 NOTE — Therapy (Signed)
[facility] [address] [place], [place], [address] Phone: [phone]   Fax:  [phone]  Physical Therapy Treatment  Patient Details  Name: Jacob Warner MRN: [id number] Date of Birth: February 18, 1924 Referring Provider: Magnus Sinning, MD  Encounter Date: 08/14/2017      PT End of Session - 08/14/17 0917    Visit Number 6   Number of Visits 10   Date for PT Re-Evaluation 09/09/17   Authorization Type medicare gcodes at 10 visits   PT Start Time 0917   PT Stop Time 1005   PT Time Calculation (min) 48 min   Activity Tolerance Patient tolerated treatment well   Behavior During Therapy [facility] for tasks assessed/performed      Past Medical History:  Diagnosis Date  . A-fib ([place])   . Alopecia   . Anemia   . Bradycardia   . Cancer ([place])   . Cherry angioma   . GERD (gastroesophageal reflux disease)   . Glaucoma   . Hypertension   . Pelvic fracture ([place])   . Right BBB/left ant fasc block   . Sick sinus syndrome ([place])   . TIA (transient ischemic attack)   . Xeroderma     Past Surgical History:  Procedure Laterality Date  . APPENDECTOMY    . CERVICAL SPINE SURGERY    . EXPLORATORY LAPAROTOMY    . LITHOTRIPSY    . PACEMAKER PLACEMENT      There were no vitals filed for this visit.      Subjective Assessment - 08/14/17 0921    Subjective States pain is 10/10 when standing.  Denies pain currently when sitting.     Patient is accompained by: Family member  wife   Pertinent History Goes by "Bill"   Limitations Standing;Walking   Patient Stated Goals walk more and not bending over as much   Currently in Pain? Yes   Pain Score 10-Worst pain ever   Pain Location Back   Pain Orientation Lower;Left   Pain Descriptors / Indicators Sore   Pain Type Chronic pain   Pain Onset More than a month ago   Pain Frequency Constant   Aggravating Factors  standing and walking   Pain Relieving Factors sitting  down    Multiple Pain Sites No                         [facility] Adult PT Treatment/Exercise - 08/14/17 0001      Neuro Re-ed    Neuro Re-ed Details  verbal and tactile cues for core/TrA contraciton with exercises     Lumbar Exercises: Stretches   Active Hamstring Stretch 3 reps;20 seconds   Single Knee to Chest Stretch 3 reps;20 seconds  bil     Lumbar Exercises: Aerobic   Stationary Bike Nustep L2 x 10 min  PT present to discuss plan     Lumbar Exercises: Supine   Ab Set 5 reps   Bent Knee Raise 20 reps;1 second  has a lot of difficulty engaging abdominals, using diaphragm   Large Ball Abdominal Isometric 10 reps;1 second  able to find abdoninal contraction with cue   Other Supine Lumbar Exercises ball squeeze with pelvic tilt - 10x 5 sec   Other Supine Lumbar Exercises just holding hooklying position without letting LE rotate left     Manual Therapy   Manual therapy comments left lumbar paraspinals   Joint Mobilization left long axis distraction, inferior  mobs, distraction mobs in flexion; anterior/posterior grade 2/3 3x 30 sec each                PT Education - 08/14/17 1016    Education provided Yes   Education Details ball squeeze with abdominal contraction   Person(s) Educated Patient   Methods Explanation;Handout;Tactile cues;Verbal cues;Demonstration   Comprehension Verbalized understanding;Returned demonstration          PT Short Term Goals - 08/14/17 1013      PT SHORT TERM GOAL #1   Title demonstrates improved gait with more even stride and less lateral lean to the right side   Time 4   Period Weeks   Status On-going     PT SHORT TERM GOAL #2   Title ind with initial HEP   Time 4   Period Weeks   Status Achieved     PT SHORT TERM GOAL #3   Title reports able to have some reduction in pain with stretches   Time 4   Period Weeks   Status On-going           PT Long Term Goals - 08/14/17 1014      PT LONG TERM GOAL #1    Title ind with advanced HEP   Time 8   Period Weeks   Status On-going     PT LONG TERM GOAL #2   Title FOTO < or = to 59%   Time 8   Period Weeks   Status On-going     PT LONG TERM GOAL #3   Title reports 50% less pain when standing and walking 5 minutes   Time 8   Period Weeks   Status On-going     PT LONG TERM GOAL #4   Title able to stand and walk at lease 8 minutes with LRAD due to increased strength and endurance   Time 8   Period Weeks   Status On-going               Plan - 08/14/17 6283    Clinical Impression Statement Patient responded well to [facility] and hip mobs.  He had decreased pain after treatment today.  Pt able to tolerate exercises and had no complaints of pain.  Pt has minimally detectable TrA contraction with a lot of tactile and verbal cues.  He will continue to need skilled PT to improve turnk stability for reduced pain.   Clinical Impairments Affecting Rehab Potential chronic pain;  lumbar pain stimulator   PT Treatment/Interventions ADLs/Self Care Home Management;Cryotherapy;Electrical Stimulation;Functional mobility training;Stair training;Gait training;Moist Heat;Therapeutic activities;Therapeutic exercise;Patient/family education;Passive range of motion;Manual techniques;Dry needling;Taping   PT Next Visit Plan continue to progress core strength, breathing, pelvic tilts, STM   Consulted and Agree with Plan of Care Patient   Family Member Consulted Pt's wife      Patient will benefit from skilled therapeutic intervention in order to improve the following deficits and impairments:  Abnormal gait, Decreased activity tolerance, Decreased balance, Decreased range of motion, Decreased mobility, Difficulty walking, Decreased strength, Increased fascial restricitons, Increased muscle spasms, Pain, Postural dysfunction  Visit Diagnosis: Chronic left-sided low back pain, with sciatica presence unspecified  Muscle weakness (generalized)  Difficulty in  walking, not elsewhere classified     Problem List Patient Active Problem List   Diagnosis Date Noted  . Post laminectomy syndrome 03/19/2017  . Chronic pain syndrome 03/19/2017  . Chronic bilateral low back pain without sciatica 03/19/2017  . Bradycardia 05/31/2015  . Pacemaker 05/31/2015  .  Peripheral edema 05/31/2015  . Heart murmur 05/31/2015  . Sick sinus syndrome ([place]) 05/31/2015  . Nonsustained ventricular tachycardia ([place]) 05/31/2015  . Follicular lymphoma ([place]) 04/02/2015    Zannie Cove, PT 08/14/2017, 10:17 AM  [facility] [address] [place], [place], [address] Phone: [phone]   Fax:  [phone]  Name: CESARE SUMLIN MRN: [id number] Date of Birth: January 17, 1924

## 2017-08-14 NOTE — Patient Instructions (Signed)
   HIP ADDUCTION SQUEEZE - SUPINE  Place a rolled up towel, ball or pillow between your knees and press your knees together so that you squeeze the object firmly. As you squeeze the ball, create some tension in your lower abdominals.  Make sure your knees and hips are staying in straight line Hold 5 sec and then release and repeat 10x.

## 2017-08-17 ENCOUNTER — Ambulatory Visit: Payer: Medicare Other | Admitting: Physical Therapy

## 2017-08-17 ENCOUNTER — Encounter: Payer: Self-pay | Admitting: Physical Therapy

## 2017-08-17 DIAGNOSIS — M6281 Muscle weakness (generalized): Secondary | ICD-10-CM

## 2017-08-17 DIAGNOSIS — M545 Low back pain: Principal | ICD-10-CM

## 2017-08-17 DIAGNOSIS — G8929 Other chronic pain: Secondary | ICD-10-CM

## 2017-08-17 DIAGNOSIS — R262 Difficulty in walking, not elsewhere classified: Secondary | ICD-10-CM

## 2017-08-17 NOTE — Therapy (Signed)
[facility] [address] [place], [place], [address] Phone: [phone]   Fax:  [phone]  Physical Therapy Treatment  Patient Details  Name: Jacob Warner MRN: [id number] Date of Birth: 1924-12-01 Referring Provider: Magnus Sinning, MD  Encounter Date: 08/17/2017      PT End of Session - 08/17/17 1015    Visit Number 7   Number of Visits 10   Date for PT Re-Evaluation 09/09/17   Authorization Type medicare gcodes at 10 visits   PT Start Time 8341   PT Stop Time 1100   PT Time Calculation (min) 46 min   Activity Tolerance Patient tolerated treatment well   Behavior During Therapy [facility] for tasks assessed/performed      Past Medical History:  Diagnosis Date  . A-fib ([place])   . Alopecia   . Anemia   . Bradycardia   . Cancer ([place])   . Cherry angioma   . GERD (gastroesophageal reflux disease)   . Glaucoma   . Hypertension   . Pelvic fracture ([place])   . Right BBB/left ant fasc block   . Sick sinus syndrome ([place])   . TIA (transient ischemic attack)   . Xeroderma     Past Surgical History:  Procedure Laterality Date  . APPENDECTOMY    . CERVICAL SPINE SURGERY    . EXPLORATORY LAPAROTOMY    . LITHOTRIPSY    . PACEMAKER PLACEMENT      There were no vitals filed for this visit.      Subjective Assessment - 08/17/17 1016    Subjective Walking to the car I am doing good.  Feeling more positive about things.   Patient is accompained by: Family member   Pertinent History Goes by [name]"   Currently in Pain? Yes  Standing 8/10, sitting 3/10 in his back   Pain Orientation Lower   Multiple Pain Sites No                         [facility] Adult PT Treatment/Exercise - 08/17/17 0001      Lumbar Exercises: Stretches   Active Hamstring Stretch 3 reps;20 seconds   Single Knee to Chest Stretch 3 reps;20 seconds  bil   Lower Trunk Rotation 3 reps;20 seconds  Including rocking gently to  initiate the exercise.   Piriformis Stretch --  Leg lengthener stretch bil 4 x5 sec hold     Lumbar Exercises: Aerobic   Stationary Bike Nustep L2 x 10 min  PT present to discuss plan     Lumbar Exercises: Seated   Long Arc Quad on Chair Strengthening;Both;2 sets;10 reps;Weights   LAQ on Chair Weights (lbs) 2   Sit to Stand --  Isomteric push into red ball seaetd 10x with core contractio   Sit to Stand Limitations Red band horizontal abd 2x 10   Focus on posture/sitting tall with core activation     Lumbar Exercises: Supine   Ab Set 10 reps  with ball squeeze   Clam 10 reps  red band   Other Supine Lumbar Exercises ball squeeze with pelvic tilt - 10x 5 sec                  PT Short Term Goals - 08/17/17 1036      PT SHORT TERM GOAL #1   Title demonstrates improved gait with more even stride and less lateral lean to the right side   Time 4  Period Weeks   Status Achieved     PT SHORT TERM GOAL #3   Title reports able to have some reduction in pain with stretches   Time 4   Period Weeks   Status Achieved           PT Long Term Goals - 08/14/17 1014      PT LONG TERM GOAL #1   Title ind with advanced HEP   Time 8   Period Weeks   Status On-going     PT LONG TERM GOAL #2   Title FOTO < or = to 59%   Time 8   Period Weeks   Status On-going     PT LONG TERM GOAL #3   Title reports 50% less pain when standing and walking 5 minutes   Time 8   Period Weeks   Status On-going     PT LONG TERM GOAL #4   Title able to stand and walk at lease 8 minutes with LRAD due to increased strength and endurance   Time 8   Period Weeks   Status On-going               Plan - 08/17/17 1015    Clinical Impression Statement Standing pain still pretty high per pt report. He also reports his stretches at home are much more tolerable meeting the short term goal. He is compliant with his HEP daily. Added weighted quad strengthening to todays session along  with seated exercises t ostress the trunk more.    Rehab Potential Good   Clinical Impairments Affecting Rehab Potential chronic pain;  lumbar pain stimulator   PT Frequency 2x / week   PT Duration 8 weeks   PT Treatment/Interventions ADLs/Self Care Home Management;Cryotherapy;Electrical Stimulation;Functional mobility training;Stair training;Gait training;Moist Heat;Therapeutic activities;Therapeutic exercise;Patient/family education;Passive range of motion;Manual techniques;Dry needling;Taping   PT Next Visit Plan continue to progress core strength, breathing, pelvic tilts, STM   Consulted and Agree with Plan of Care Patient      Patient will benefit from skilled therapeutic intervention in order to improve the following deficits and impairments:  Abnormal gait, Decreased activity tolerance, Decreased balance, Decreased range of motion, Decreased mobility, Difficulty walking, Decreased strength, Increased fascial restricitons, Increased muscle spasms, Pain, Postural dysfunction  Visit Diagnosis: Chronic left-sided low back pain, with sciatica presence unspecified  Muscle weakness (generalized)  Difficulty in walking, not elsewhere classified     Problem List Patient Active Problem List   Diagnosis Date Noted  . Post laminectomy syndrome 03/19/2017  . Chronic pain syndrome 03/19/2017  . Chronic bilateral low back pain without sciatica 03/19/2017  . Bradycardia 05/31/2015  . Pacemaker 05/31/2015  . Peripheral edema 05/31/2015  . Heart murmur 05/31/2015  . Sick sinus syndrome ([place]) 05/31/2015  . Nonsustained ventricular tachycardia ([place]) 05/31/2015  . Follicular lymphoma ([place]) 04/02/2015    Donella Pascarella, PTA 08/17/2017, 10:58 AM  [facility] [address] [place], [place], [address] Phone: [phone]   Fax:  [phone]  Name: Jacob Warner MRN: [id number] Date of Birth: 10/18/1924

## 2017-08-18 ENCOUNTER — Encounter: Payer: Self-pay | Admitting: Internal Medicine

## 2017-08-18 ENCOUNTER — Ambulatory Visit (INDEPENDENT_AMBULATORY_CARE_PROVIDER_SITE_OTHER): Payer: Medicare Other | Admitting: Internal Medicine

## 2017-08-18 VITALS — BP 140/86 | HR 100 | Ht 70.5 in | Wt 219.8 lb

## 2017-08-18 DIAGNOSIS — R001 Bradycardia, unspecified: Secondary | ICD-10-CM | POA: Diagnosis not present

## 2017-08-18 DIAGNOSIS — I495 Sick sinus syndrome: Secondary | ICD-10-CM

## 2017-08-18 DIAGNOSIS — I480 Paroxysmal atrial fibrillation: Secondary | ICD-10-CM | POA: Diagnosis not present

## 2017-08-18 MED ORDER — FUROSEMIDE 20 MG PO TABS: 20.0000 mg | ORAL_TABLET | Freq: Every day | ORAL | 3 refills | Status: DC | PRN

## 2017-08-18 NOTE — Patient Instructions (Signed)
Medication Instructions:  Your physician has recommended you make the following change in your medication:  1.  Lasix 20 mg one tablet by mouth daily as needed for swelling.  Labwork: None ordered.   Testing/Procedures: None ordered.   Follow-Up: Your physician wants you to follow-up in: one year with Dr. Lovena Le.   You will receive a reminder letter in the mail two months in advance. If you don't receive a letter, please call our office to schedule the follow-up appointment.  Remote monitoring is used to monitor your Pacemaker from home. This monitoring reduces the number of office visits required to check your device to one time per year. It allows [place] to keep an eye on the functioning of your device to ensure it is working properly. You are scheduled for a device check from home on 09/15/2017. You may send your transmission at any time that day. If you have a wireless device, the transmission will be sent automatically. After your physician reviews your transmission, you will receive a postcard with your next transmission date.    Any Other Special Instructions Will Be Listed Below (If Applicable).     If you need a refill on your cardiac medications before your next appointment, please call your pharmacy.

## 2017-08-18 NOTE — Progress Notes (Signed)
HPI Jacob Warner returns today for follow-up of his pacemaker and paroxysmal atrial fibrillation. He is an 83 year old man with a history of symptomatic sinus node dysfunction status post permanent pacemaker insertion. The patient has experienced problems with peripheral edema in the interim as well as minimal dyspnea with exertion. No PND. No orthopnea. He has had no syncope. He denies chest pain. He admits to a center lifestyle secondary to severe arthritis and neuropathic pain. Allergies  Allergen Reactions  . Sulfa Antibiotics Hives and Rash    Bactrim  . Sulfacetamide Sodium Rash and Hives    Bactrim     Current Outpatient Prescriptions  Medication Sig Dispense Refill  . acetaminophen (TYLENOL) 500 MG tablet Take 1,000 mg by mouth every 6 (six) hours as needed (pain).     Marland Kitchen amiodarone (PACERONE) 200 MG tablet TAKE 1 TABLET (200 MG TOTAL) BY MOUTH DAILY. 90 tablet 1  . aspirin 81 MG tablet Take 81 mg by mouth daily.    . Cyanocobalamin (VITAMIN B-12 PO) Take 1 tablet by mouth daily.    Marland Kitchen doxazosin (CARDURA) 4 MG tablet Take 4 mg by mouth at bedtime.    . dutasteride (AVODART) 0.5 MG capsule Take 0.5 mg by mouth daily.    Marland Kitchen gabapentin (NEURONTIN) 300 MG capsule Take 300 mg by mouth every morning.     . lactulose (CHRONULAC) 10 GM/15ML solution Take 10 g by mouth 2 (two) times daily as needed for mild constipation.   0  . latanoprost (XALATAN) 0.005 % ophthalmic solution PLACE 1 DROP INTO BOTH EYES AT BEDTIME  11  . magnesium oxide (MAG-OX) 400 (241.3 MG) MG tablet Take 1 tablet by mouth 2 (two) times daily.  2  . RESTASIS 0.05 % ophthalmic emulsion PLACE 1 DROP INTO BOTH EYES 2 TIMES DAILY.  11  . traMADol (ULTRAM) 50 MG tablet Take 1 tablet (50 mg total) by mouth every 6 (six) hours as needed for moderate pain. 40 tablet 0  . Vitamin D, Ergocalciferol, (DRISDOL) 50000 units CAPS capsule Take 1 capsule by mouth once a week. Takes on Tuesdays    . furosemide (LASIX) 20 MG tablet  Take 1 tablet (20 mg total) by mouth daily as needed (May take daily as needed for swelling). Take lasix 20 mg daily by mouth as needed for swelling. 90 tablet 3   No current facility-administered medications for this visit.      Past Medical History:  Diagnosis Date  . A-fib ([place])   . Alopecia   . Anemia   . Bradycardia   . Cancer ([place])   . Cherry angioma   . GERD (gastroesophageal reflux disease)   . Glaucoma   . Hypertension   . Pelvic fracture ([place])   . Right BBB/left ant fasc block   . Sick sinus syndrome ([place])   . TIA (transient ischemic attack)   . Xeroderma     ROS:   All systems reviewed and negative except as noted in the HPI.   Past Surgical History:  Procedure Laterality Date  . APPENDECTOMY    . CERVICAL SPINE SURGERY    . EXPLORATORY LAPAROTOMY    . LITHOTRIPSY    . PACEMAKER PLACEMENT       Family History  Problem Relation Age of Onset  . Brain cancer Sister      Social History   Social History  . Marital status: Married    Spouse name: N/A  . Number of children: N/A  .  Years of education: N/A   Occupational History  . Not on file.   Social History Main Topics  . Smoking status: Never Smoker  . Smokeless tobacco: Never Used  . Alcohol use No  . Drug use: No  . Sexual activity: Not on file   Other Topics Concern  . Not on file   Social History Narrative  . No narrative on file     BP 140/86   Pulse 100   Ht 5' 10.5" (1.791 m)   Wt 219 lb 12.8 oz (99.7 kg)   BMI 31.09 kg/m   Physical Exam:  Well appearing 83 year old man, NAD HEENT: Unremarkable Neck:  No JVD, no thyromegally Lymphatics:  No adenopathy Back:  No CVA tenderness Lungs:  Clear, except for rare basilar rales HEART:  Regular rate rhythm, no murmurs, no rubs, no clicks Abd:  soft, positive bowel sounds, no organomegally, no rebound, no guarding Ext:  2 plus pulses, 1+ peripheral edema, no cyanosis, no clubbing Skin:  No rashes no nodules Neuro:  CN II  through XII intact, motor grossly intact   DEVICE  Normal device function.  See PaceArt for details.   Assess/Plan: 1. Sinus node dysfunction - his [employer] dual-chamber pacemaker is working normally and he is asymptomatic. We'll plan to recheck his device in several months.  2. Peripheral edema and chronic diastolic heart failure - today I've asked the patient to start Lasix 20 mg daily and to maintain a low sodium diet. Additional recommendations will follow as needed.  3. Hypertension - his systolic blood pressure is elevated slightly today. I suspect it will improve with the addition of low-dose diuretic therapy and a low-sodium diet.

## 2017-08-21 ENCOUNTER — Encounter: Payer: Self-pay | Admitting: Physical Therapy

## 2017-08-21 ENCOUNTER — Ambulatory Visit: Payer: Medicare Other | Admitting: Physical Therapy

## 2017-08-21 DIAGNOSIS — M545 Low back pain: Principal | ICD-10-CM

## 2017-08-21 DIAGNOSIS — R262 Difficulty in walking, not elsewhere classified: Secondary | ICD-10-CM

## 2017-08-21 DIAGNOSIS — M6281 Muscle weakness (generalized): Secondary | ICD-10-CM

## 2017-08-21 DIAGNOSIS — G8929 Other chronic pain: Secondary | ICD-10-CM

## 2017-08-21 NOTE — Therapy (Signed)
[facility] [address], [place], [place], [address] Phone: [phone]   Fax:  [phone]  Physical Therapy Treatment  Patient Details  Name: Jacob Warner MRN: [id number] Date of Birth: 11-29-1924 Referring Provider: Magnus Sinning, MD  Encounter Date: 08/21/2017      PT End of Session - 08/21/17 1100    Visit Number 8   Number of Visits 10   Date for PT Re-Evaluation 09/09/17   Authorization Type medicare gcodes at 10 visits   PT Start Time 1100   PT Stop Time 1149   PT Time Calculation (min) 49 min   Activity Tolerance Patient tolerated treatment well   Behavior During Therapy [facility] for tasks assessed/performed      Past Medical History:  Diagnosis Date  . A-fib ([place])   . Alopecia   . Anemia   . Bradycardia   . Cancer ([place])   . Cherry angioma   . GERD (gastroesophageal reflux disease)   . Glaucoma   . Hypertension   . Pelvic fracture ([place])   . Right BBB/left ant fasc block   . Sick sinus syndrome ([place])   . TIA (transient ischemic attack)   . Xeroderma     Past Surgical History:  Procedure Laterality Date  . APPENDECTOMY    . CERVICAL SPINE SURGERY    . EXPLORATORY LAPAROTOMY    . LITHOTRIPSY    . PACEMAKER PLACEMENT      There were no vitals filed for this visit.      Subjective Assessment - 08/21/17 1108    Subjective I think I'm going to have to deal with that pain.  I don't feel any different as far as pain, but my muscles feel stronger.   Patient is accompained by: Family member  wife   Pertinent History Goes by "Jacob Warner"   Limitations Standing;Walking   Patient Stated Goals walk more and not bending over as much   Currently in Pain? Yes  when standing   Pain Score 9    Pain Location Back   Pain Orientation Lower   Pain Descriptors / Indicators Sore   Pain Type Chronic pain   Pain Onset More than a month ago   Pain Frequency Constant   Aggravating Factors  standing and  walking   Pain Relieving Factors sitting and lying down   Effect of Pain on Daily Activities getting stuff done and walking   Multiple Pain Sites No                         [facility] Adult PT Treatment/Exercise - 08/21/17 0001      Lumbar Exercises: Stretches   Active Hamstring Stretch 3 reps;20 seconds   Single Knee to Chest Stretch 3 reps;20 seconds  bil   Lower Trunk Rotation 3 reps;20 seconds  Including rocking gently to initiate the exercise.   Piriformis Stretch --  Leg lengthener stretch bil 4 x5 sec hold     Lumbar Exercises: Aerobic   Stationary Bike Nustep L2 x 10 min  PT present to discuss plan     Lumbar Exercises: Seated   Long Arc Quad on Chair Strengthening;Both;2 sets;10 reps;Weights   LAQ on Chair Weights (lbs) 2  with isometric core contraction   Sit to Stand --  Isomteric push on foam roll seaetd 10x with core contractio   Sit to Stand Limitations Red band horizontal abd 2x 10   Focus on posture/sitting tall  with core activation     Lumbar Exercises: Supine   Ab Set 10 reps  with ball squeeze   Clam 10 reps  red band   Large Ball Abdominal Isometric 10 reps  holding red ball raising overhead and back   Other Supine Lumbar Exercises pelvic tilt 5 sec hold x 10                  PT Short Term Goals - 08/17/17 1036      PT SHORT TERM GOAL #1   Title demonstrates improved gait with more even stride and less lateral lean to the right side   Time 4   Period Weeks   Status Achieved     PT SHORT TERM GOAL #3   Title reports able to have some reduction in pain with stretches   Time 4   Period Weeks   Status Achieved           PT Long Term Goals - 08/14/17 1014      PT LONG TERM GOAL #1   Title ind with advanced HEP   Time 8   Period Weeks   Status On-going     PT LONG TERM GOAL #2   Title FOTO < or = to 59%   Time 8   Period Weeks   Status On-going     PT LONG TERM GOAL #3   Title reports 50% less pain when  standing and walking 5 minutes   Time 8   Period Weeks   Status On-going     PT LONG TERM GOAL #4   Title able to stand and walk at lease 8 minutes with LRAD due to increased strength and endurance   Time 8   Period Weeks   Status On-going               Plan - 08/21/17 1101    Clinical Impression Statement Pt states there is still one spot that is very sore when standing.  Pt continues to need a lot of cues to contract core.  He will benefit from skilled PT to work on core strength for improved function.   Rehab Potential Good   Clinical Impairments Affecting Rehab Potential chronic pain;  lumbar pain stimulator   PT Treatment/Interventions ADLs/Self Care Home Management;Cryotherapy;Electrical Stimulation;Functional mobility training;Stair training;Gait training;Moist Heat;Therapeutic activities;Therapeutic exercise;Patient/family education;Passive range of motion;Manual techniques;Dry needling;Taping   PT Next Visit Plan continue to progress core strength, breathing, pelvic tilts, STM   Consulted and Agree with Plan of Care Patient      Patient will benefit from skilled therapeutic intervention in order to improve the following deficits and impairments:  Abnormal gait, Decreased activity tolerance, Decreased balance, Decreased range of motion, Decreased mobility, Difficulty walking, Decreased strength, Increased fascial restricitons, Increased muscle spasms, Pain, Postural dysfunction  Visit Diagnosis: Chronic left-sided low back pain, with sciatica presence unspecified  Muscle weakness (generalized)  Difficulty in walking, not elsewhere classified     Problem List Patient Active Problem List   Diagnosis Date Noted  . Post laminectomy syndrome 03/19/2017  . Chronic pain syndrome 03/19/2017  . Chronic bilateral low back pain without sciatica 03/19/2017  . Bradycardia 05/31/2015  . Pacemaker 05/31/2015  . Peripheral edema 05/31/2015  . Heart murmur 05/31/2015  .  Sick sinus syndrome ([place]) 05/31/2015  . Nonsustained ventricular tachycardia ([place]) 05/31/2015  . 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 [place], [place], [address] Phone: [phone]   Fax:  [phone]  Name: VERNOR MONNIG MRN: [id number] Date of Birth: 03/05/1924

## 2017-08-24 ENCOUNTER — Encounter: Payer: Self-pay | Admitting: Physical Therapy

## 2017-08-24 ENCOUNTER — Ambulatory Visit: Payer: Medicare Other | Admitting: Physical Therapy

## 2017-08-24 DIAGNOSIS — M545 Low back pain: Principal | ICD-10-CM

## 2017-08-24 DIAGNOSIS — R262 Difficulty in walking, not elsewhere classified: Secondary | ICD-10-CM

## 2017-08-24 DIAGNOSIS — G8929 Other chronic pain: Secondary | ICD-10-CM

## 2017-08-24 DIAGNOSIS — M6281 Muscle weakness (generalized): Secondary | ICD-10-CM

## 2017-08-24 NOTE — Therapy (Signed)
[facility] [address], [place], [place], [address] Phone: [phone]   Fax:  [phone]  Physical Therapy Treatment  Patient Details  Name: Jacob Warner MRN: [id number] Date of Birth: 1924/01/26 Referring Provider: Magnus Sinning, MD  Encounter Date: 08/24/2017      PT End of Session - 08/24/17 1021    Visit Number 9   Number of Visits 10   Date for PT Re-Evaluation 09/09/17   Authorization Type medicare gcodes at 10 visits   PT Start Time 1020   PT Stop Time 1100   PT Time Calculation (min) 40 min   Activity Tolerance Patient tolerated treatment well  Kept seated as this helped pain reduce   Behavior During Therapy [facility] for tasks assessed/performed      Past Medical History:  Diagnosis Date  . A-fib ([place])   . Alopecia   . Anemia   . Bradycardia   . Cancer ([place])   . Cherry angioma   . GERD (gastroesophageal reflux disease)   . Glaucoma   . Hypertension   . Pelvic fracture ([place])   . Right BBB/left ant fasc block   . Sick sinus syndrome ([place])   . TIA (transient ischemic attack)   . Xeroderma     Past Surgical History:  Procedure Laterality Date  . APPENDECTOMY    . CERVICAL SPINE SURGERY    . EXPLORATORY LAPAROTOMY    . LITHOTRIPSY    . PACEMAKER PLACEMENT      There were no vitals filed for this visit.      Subjective Assessment - 08/24/17 1023    Subjective My pain is very bad today, a 10/10. I have only taken Advil.     Pertinent History Goes by "Bill"   Limitations Standing;Walking   Currently in Pain? Yes   Pain Score 10-Worst pain ever   Pain Location --  Back and legs    Pain Orientation Right;Left   Pain Descriptors / Indicators Stabbing;Radiating   Aggravating Factors  tanding and walking   Pain Relieving Factors sitting or laying down   Multiple Pain Sites No                         [facility] Adult PT Treatment/Exercise - 08/24/17 0001      Lumbar  Exercises: Stretches   Active Hamstring Stretch 3 reps;20 seconds  Seated today     Lumbar Exercises: Aerobic   Stationary Bike Nustep L2x 10 min   PTA present to monitor     Lumbar Exercises: Seated   Long Arc Quad on Chair Strengthening;Both;2 sets;10 reps;Weights   LAQ on Chair Weights (lbs) 4   LAQ on Chair Limitations Seated ball squeeze 5 sec hold 10x   Hip Flexion on Ball --  red band clamshell 2x20   Sit to Stand --  Marching 4# 2x10 bil   Sit to Stand Limitations Red band horizontal abd 2x 10   Focus on posture/sitting tall with core activation, ball add                  PT Short Term Goals - 08/17/17 1036      PT SHORT TERM GOAL #1   Title demonstrates improved gait with more even stride and less lateral lean to the right side   Time 4   Period Weeks   Status Achieved     PT SHORT TERM GOAL #3   Title reports  able to have some reduction in pain with stretches   Time 4   Period Weeks   Status Achieved           PT Long Term Goals - 08/24/17 1040      PT LONG TERM GOAL #3   Title reports 50% less pain when standing and walking 5 minutes   Time 8   Period Weeks   Status On-going  no change     PT LONG TERM GOAL #4   Title able to stand and walk at lease 8 minutes with LRAD due to increased strength and endurance   Time 8   Status On-going               Plan - 08/24/17 1022    Clinical Impression Statement Pt in a lot of pain today. He stayed seated for ther-ex as this position reduced his back down to a 5/10 and his legs to a 6-7/10. Pain was not influenced by exercise, only body position.  Discussed calling MD to discuss pain with his MD as he currently does not have a follow up appt.    Rehab Potential Good   Clinical Impairments Affecting Rehab Potential chronic pain;  lumbar pain stimulator   PT Frequency 2x / week   PT Duration 8 weeks   PT Treatment/Interventions ADLs/Self Care Home Management;Cryotherapy;Electrical  Stimulation;Functional mobility training;Stair training;Gait training;Moist Heat;Therapeutic activities;Therapeutic exercise;Patient/family education;Passive range of motion;Manual techniques;Dry needling;Taping   PT Next Visit Plan continue to progress core strength, breathing, pelvic tilts, G code/10th visit next   Consulted and Agree with Plan of Care Patient   Family Member Consulted Pt's wife      Patient will benefit from skilled therapeutic intervention in order to improve the following deficits and impairments:  Abnormal gait, Decreased activity tolerance, Decreased balance, Decreased range of motion, Decreased mobility, Difficulty walking, Decreased strength, Increased fascial restricitons, Increased muscle spasms, Pain, Postural dysfunction  Visit Diagnosis: Chronic left-sided low back pain, with sciatica presence unspecified  Muscle weakness (generalized)  Difficulty in walking, not elsewhere classified     Problem List Patient Active Problem List   Diagnosis Date Noted  . Post laminectomy syndrome 03/19/2017  . Chronic pain syndrome 03/19/2017  . Chronic bilateral low back pain without sciatica 03/19/2017  . Bradycardia 05/31/2015  . Pacemaker 05/31/2015  . Peripheral edema 05/31/2015  . Heart murmur 05/31/2015  . Sick sinus syndrome ([place]) 05/31/2015  . Nonsustained ventricular tachycardia ([place]) 05/31/2015  . 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, [place], [place], [address] Phone: [phone]   Fax:  [phone]  Name: Jacob Warner MRN: [id number] Date of Birth: February 18, 1924

## 2017-08-28 ENCOUNTER — Ambulatory Visit: Payer: Medicare Other | Admitting: Physical Therapy

## 2017-08-28 DIAGNOSIS — M545 Low back pain: Principal | ICD-10-CM

## 2017-08-28 DIAGNOSIS — R262 Difficulty in walking, not elsewhere classified: Secondary | ICD-10-CM

## 2017-08-28 DIAGNOSIS — M6281 Muscle weakness (generalized): Secondary | ICD-10-CM

## 2017-08-28 DIAGNOSIS — G8929 Other chronic pain: Secondary | ICD-10-CM

## 2017-08-28 NOTE — Therapy (Signed)
[facility] [address] [place], [place], [address] Phone: [phone]   Fax:  [phone]  Physical Therapy Treatment  Patient Details  Name: Jacob Warner MRN: [id number] Date of Birth: 1924/08/07 Referring Provider: Magnus Sinning, MD  Encounter Date: 08/28/2017      PT End of Session - 08/28/17 1055    Visit Number 10   Number of Visits 20   Date for PT Re-Evaluation 09/09/17   Authorization Type medicare gcodes at 20 visits   PT Start Time 1015   PT Stop Time 1100   PT Time Calculation (min) 45 min   Activity Tolerance Patient tolerated treatment well      Past Medical History:  Diagnosis Date  . A-fib ([place])   . Alopecia   . Anemia   . Bradycardia   . Cancer ([place])   . Cherry angioma   . GERD (gastroesophageal reflux disease)   . Glaucoma   . Hypertension   . Pelvic fracture ([place])   . Right BBB/left ant fasc block   . Sick sinus syndrome ([place])   . TIA (transient ischemic attack)   . Xeroderma     Past Surgical History:  Procedure Laterality Date  . APPENDECTOMY    . CERVICAL SPINE SURGERY    . EXPLORATORY LAPAROTOMY    . LITHOTRIPSY    . PACEMAKER PLACEMENT      There were no vitals filed for this visit.      Subjective Assessment - 08/28/17 1019    Subjective My back is "still hurting like the devil." Sitting or lying down takes the pressure off.     Pertinent History Goes by [name]"   Currently in Pain? Yes   Pain Score 9    Pain Location Back   Aggravating Factors  standing or walking   Pain Relieving Factors sitting or lying down                         [facility] Adult PT Treatment/Exercise - 08/28/17 0001      Therapeutic Activites    Therapeutic Activities ADL's   ADL's core and LE strength needed for standing and walking     Neuro Re-ed    Neuro Re-ed Details  verbal and tactile cues for core/TrA contraciton with exercises     Lumbar Exercises:  Stretches   Active Hamstring Stretch 3 reps;20 seconds  Seated today     Lumbar Exercises: Aerobic   Stationary Bike Nustep L2x 10 min   PTA present to monitor     Lumbar Exercises: Seated   Long Arc Quad on Chair Strengthening;Right;Left;15 reps   LAQ on Chair Limitations red band   Hip Flexion on Ball Limitations red band hip flex 15x   Sit to Stand Limitations foam roll push down 16x     Lumbar Exercises: Supine   Clam 20 reps   Clam Limitations green band   Bent Knee Raise Limitations marching 10x   Other Supine Lumbar Exercises whole leg press down 10x right/left                  PT Short Term Goals - 08/28/17 1107      PT SHORT TERM GOAL #1   Title demonstrates improved gait with more even stride and less lateral lean to the right side   Status Achieved     PT SHORT TERM GOAL #2   Title ind with initial HEP  Status Achieved     PT SHORT TERM GOAL #3   Title reports able to have some reduction in pain with stretches   Status Achieved           PT Long Term Goals - 09-26-2017 1108      PT LONG TERM GOAL #1   Title ind with advanced HEP   Time 8   Period Weeks   Status On-going     PT LONG TERM GOAL #2   Title FOTO < or = to 59%   Time 8   Period Weeks   Status On-going     PT LONG TERM GOAL #3   Title reports 50% less pain when standing and walking 5 minutes   Time 8   Period Weeks   Status On-going     PT LONG TERM GOAL #4   Title able to stand and walk at lease 8 minutes with LRAD due to increased strength and endurance   Time 8   Period Weeks   Status On-going               Plan - 09/26/2017 1056    Clinical Impression Statement Progress with PT is slowed by pain severity.  His pain ranges from 8 -10/10.  Pain is lessened with supine or sitting to 2/10.  Patient states he feels he is standing straighter and feels he is progressing with PT.  He demonstrates good carryover and compliance with current HEP.   Rehab Potential Good    Clinical Impairments Affecting Rehab Potential chronic pain;  lumbar pain stimulator   PT Frequency 2x / week   PT Duration 8 weeks   PT Treatment/Interventions ADLs/Self Care Home Management;Cryotherapy;Electrical Stimulation;Functional mobility training;Stair training;Gait training;Moist Heat;Therapeutic activities;Therapeutic exercise;Patient/family education;Passive range of motion;Manual techniques;Dry needling;Taping   PT Next Visit Plan continue to progress core strength, gradually progress to standing as pain allows      Patient will benefit from skilled therapeutic intervention in order to improve the following deficits and impairments:  Abnormal gait, Decreased activity tolerance, Decreased balance, Decreased range of motion, Decreased mobility, Difficulty walking, Decreased strength, Increased fascial restricitons, Increased muscle spasms, Pain, Postural dysfunction  Visit Diagnosis: Chronic left-sided low back pain, with sciatica presence unspecified  Muscle weakness (generalized)  Difficulty in walking, not elsewhere classified       G-Codes - 09/26/17 1055    Functional Limitation Mobility: Walking and moving around   Mobility: Walking and Moving Around Current Status [phone]) At least 60 percent but less than 80 percent impaired, limited or restricted   Mobility: Walking and Moving Around Goal Status [phone]) At least 40 percent but less than 60 percent impaired, limited or restricted      Problem List Patient Active Problem List   Diagnosis Date Noted  . Post laminectomy syndrome 03/19/2017  . Chronic pain syndrome 03/19/2017  . Chronic bilateral low back pain without sciatica 03/19/2017  . Bradycardia 05/31/2015  . Pacemaker 05/31/2015  . Peripheral edema 05/31/2015  . Heart murmur 05/31/2015  . Sick sinus syndrome ([place]) 05/31/2015  . Nonsustained ventricular tachycardia ([place]) 05/31/2015  . 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 [place], [place], [address] Phone: [phone]   Fax:  [phone]  Name: KAYDEN HUTMACHER MRN: [id number] Date of Birth: Feb 14, 1924

## 2017-08-28 NOTE — Therapy (Signed)
[facility] [address], [place], [place], [address] Phone: [phone]   Fax:  [phone]  Physical Therapy Treatment  Patient Details  Name: Jacob Warner MRN: [id number] Date of Birth: [birth year] Referring Provider: Magnus Sinning, MD  Encounter Date: 08/28/2017      PT End of Session - 08/28/17 1055    Visit Number 10   Number of Visits 20   Date for PT Re-Evaluation 09/09/17   Authorization Type medicare gcodes at 20 visits   PT Start Time 1015   PT Stop Time 1100   PT Time Calculation (min) 45 min   Activity Tolerance Patient tolerated treatment well      Past Medical History:  Diagnosis Date  . A-fib ([place])   . Alopecia   . Anemia   . Bradycardia   . Cancer ([place])   . Cherry angioma   . GERD (gastroesophageal reflux disease)   . Glaucoma   . Hypertension   . Pelvic fracture ([place])   . Right BBB/left ant fasc block   . Sick sinus syndrome ([place])   . TIA (transient ischemic attack)   . Xeroderma     Past Surgical History:  Procedure Laterality Date  . APPENDECTOMY    . CERVICAL SPINE SURGERY    . EXPLORATORY LAPAROTOMY    . LITHOTRIPSY    . PACEMAKER PLACEMENT      There were no vitals filed for this visit.      Subjective Assessment - 08/28/17 1019    Subjective My back is "still hurting like the devil." Sitting or lying down takes the pressure off.     Pertinent History Goes by [name]"   Currently in Pain? Yes   Pain Score 9    Pain Location Back   Aggravating Factors  standing or walking   Pain Relieving Factors sitting or lying down            [facility] PT Assessment - 08/28/17 0001      Strength   Overall Strength Comments Core strength 3+/5;  hip 4-/5;  knees 4-/5     Functional Gait  Assessment   Gait assessed  --  standing tolerance 2-3 minutes                     [facility] Adult PT Treatment/Exercise - 08/28/17 0001      Therapeutic Activites    Therapeutic Activities ADL's   ADL's core and LE strength needed for standing and walking     Neuro Re-ed    Neuro Re-ed Details  verbal and tactile cues for core/TrA contraciton with exercises     Lumbar Exercises: Stretches   Active Hamstring Stretch 3 reps;20 seconds  Seated today     Lumbar Exercises: Aerobic   Stationary Bike Nustep L2x 10 min   PTA present to monitor     Lumbar Exercises: Seated   Long Arc Quad on Chair Strengthening;Right;Left;15 reps   LAQ on Chair Limitations red band   Hip Flexion on Ball Limitations red band hip flex 15x   Sit to Stand Limitations foam roll push down 16x     Lumbar Exercises: Supine   Clam 20 reps   Clam Limitations green band   Bent Knee Raise Limitations marching 10x   Other Supine Lumbar Exercises whole leg press down 10x right/left                  PT Short Term Goals -  09-14-2017 1107      PT SHORT TERM GOAL #1   Title demonstrates improved gait with more even stride and less lateral lean to the right side   Status Achieved     PT SHORT TERM GOAL #2   Title ind with initial HEP   Status Achieved     PT SHORT TERM GOAL #3   Title reports able to have some reduction in pain with stretches   Status Achieved           PT Long Term Goals - Sep 14, 2017 1108      PT LONG TERM GOAL #1   Title ind with advanced HEP   Time 8   Period Weeks   Status On-going     PT LONG TERM GOAL #2   Title FOTO < or = to 59%   Time 8   Period Weeks   Status On-going     PT LONG TERM GOAL #3   Title reports 50% less pain when standing and walking 5 minutes   Time 8   Period Weeks   Status On-going     PT LONG TERM GOAL #4   Title able to stand and walk at lease 8 minutes with LRAD due to increased strength and endurance   Time 8   Period Weeks   Status On-going               Plan - 09/14/17 1056    Clinical Impression Statement Progress with PT is slowed by pain severity.  His pain ranges from 8 -10/10.   Pain is lessened with supine or sitting to 2/10.  Patient states he feels he is standing straighter and feels he is progressing with PT.  He demonstrates good carryover and compliance with current HEP.   Rehab Potential Good   Clinical Impairments Affecting Rehab Potential chronic pain;  lumbar pain stimulator   PT Frequency 2x / week   PT Duration 8 weeks   PT Treatment/Interventions ADLs/Self Care Home Management;Cryotherapy;Electrical Stimulation;Functional mobility training;Stair training;Gait training;Moist Heat;Therapeutic activities;Therapeutic exercise;Patient/family education;Passive range of motion;Manual techniques;Dry needling;Taping   PT Next Visit Plan continue to progress core strength, gradually progress to standing as pain allows      Patient will benefit from skilled therapeutic intervention in order to improve the following deficits and impairments:  Abnormal gait, Decreased activity tolerance, Decreased balance, Decreased range of motion, Decreased mobility, Difficulty walking, Decreased strength, Increased fascial restricitons, Increased muscle spasms, Pain, Postural dysfunction  Visit Diagnosis: Chronic left-sided low back pain, with sciatica presence unspecified  Muscle weakness (generalized)  Difficulty in walking, not elsewhere classified       G-Codes - 09-14-2017 1055    Functional Assessment Tool Used (Outpatient Only) clinical impression   Functional Limitation Mobility: Walking and moving around   Mobility: Walking and Moving Around Current Status [phone]) At least 60 percent but less than 80 percent impaired, limited or restricted   Mobility: Walking and Moving Around Goal Status [phone]) At least 40 percent but less than 60 percent impaired, limited or restricted      Problem List Patient Active Problem List   Diagnosis Date Noted  . Post laminectomy syndrome 03/19/2017  . Chronic pain syndrome 03/19/2017  . Chronic bilateral low back pain without  sciatica 03/19/2017  . Bradycardia 05/31/2015  . Pacemaker 05/31/2015  . Peripheral edema 05/31/2015  . Heart murmur 05/31/2015  . Sick sinus syndrome ([place]) 05/31/2015  . Nonsustained ventricular tachycardia ([place]) 05/31/2015  . 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, [place], [place], [address] Phone: [phone]   Fax:  [phone]  Name: ROMMIE DUNN MRN: [id number] Date of Birth: 06-18-1924

## 2017-09-01 ENCOUNTER — Ambulatory Visit: Payer: Medicare Other | Attending: Physical Medicine and Rehabilitation | Admitting: Physical Therapy

## 2017-09-01 DIAGNOSIS — M6281 Muscle weakness (generalized): Secondary | ICD-10-CM | POA: Insufficient documentation

## 2017-09-01 DIAGNOSIS — G8929 Other chronic pain: Secondary | ICD-10-CM | POA: Insufficient documentation

## 2017-09-01 DIAGNOSIS — R262 Difficulty in walking, not elsewhere classified: Secondary | ICD-10-CM | POA: Insufficient documentation

## 2017-09-01 DIAGNOSIS — M545 Low back pain: Secondary | ICD-10-CM | POA: Diagnosis not present

## 2017-09-01 NOTE — Therapy (Signed)
[facility] [address] [place], [place], [address] Phone: [phone]   Fax:  [phone]  Physical Therapy Treatment  Patient Details  Name: Jacob Warner MRN: [id number] Date of Birth: 29-Apr-1924 Referring Provider: Magnus Sinning, MD  Encounter Date: 09/01/2017      PT End of Session - 09/01/17 1139    PT Start Time 1105   PT Stop Time 1149   PT Time Calculation (min) 44 min   Activity Tolerance Patient tolerated treatment well   Behavior During Therapy [facility] for tasks assessed/performed      Past Medical History:  Diagnosis Date  . A-fib ([place])   . Alopecia   . Anemia   . Bradycardia   . Cancer ([place])   . Cherry angioma   . GERD (gastroesophageal reflux disease)   . Glaucoma   . Hypertension   . Pelvic fracture ([place])   . Right BBB/left ant fasc block   . Sick sinus syndrome ([place])   . TIA (transient ischemic attack)   . Xeroderma     Past Surgical History:  Procedure Laterality Date  . APPENDECTOMY    . CERVICAL SPINE SURGERY    . EXPLORATORY LAPAROTOMY    . LITHOTRIPSY    . PACEMAKER PLACEMENT      There were no vitals filed for this visit.                       [facility] Adult PT Treatment/Exercise - 09/01/17 0001      Lumbar Exercises: Stretches   Active Hamstring Stretch 3 reps;20 seconds  Seated today     Lumbar Exercises: Aerobic   Stationary Bike Nustep L2x 10 min   PT present to monitor     Lumbar Exercises: Seated   Long Arc Quad on Chair Strengthening;Right;Left;15 reps   LAQ on Chair Weights (lbs) 5   LAQ on Chair Limitations seated ball squeeze   Hip Flexion on Ball Limitations hamstring curl - seated - green band 20x   Sit to Stand Limitations foam roll push down 16x                  PT Short Term Goals - 08/28/17 1107      PT SHORT TERM GOAL #1   Title demonstrates improved gait with more even stride and less lateral lean to the right  side   Status Achieved     PT SHORT TERM GOAL #2   Title ind with initial HEP   Status Achieved     PT SHORT TERM GOAL #3   Title reports able to have some reduction in pain with stretches   Status Achieved           PT Long Term Goals - 09/01/17 1141      PT LONG TERM GOAL #1   Title ind with advanced HEP   Time 8   Period Weeks   Status On-going     PT LONG TERM GOAL #2   Title FOTO < or = to 59%   Time 8   Period Weeks   Status On-going     PT LONG TERM GOAL #3   Title reports 50% less pain when standing and walking 5 minutes   Time 8   Status On-going     PT LONG TERM GOAL #4   Title able to stand and walk at lease 8 minutes with LRAD due to increased strength and endurance  Time 8   Period Weeks   Status On-going               Plan - 09/01/17 1140    Clinical Impression Statement Patient was able to perform exercises in sitting without back support for > 30 minutes and was able to increase resistence with LAQ.  Pt continues to need skilled PT for core and strength in standing as tolerated.     Clinical Impairments Affecting Rehab Potential chronic pain;  lumbar pain stimulator   PT Treatment/Interventions ADLs/Self Care Home Management;Cryotherapy;Electrical Stimulation;Functional mobility training;Stair training;Gait training;Moist Heat;Therapeutic activities;Therapeutic exercise;Patient/family education;Passive range of motion;Manual techniques;Dry needling;Taping   PT Next Visit Plan continue to progress core strength, gradually progress to standing as pain allows   Consulted and Agree with Plan of Care Patient      Patient will benefit from skilled therapeutic intervention in order to improve the following deficits and impairments:  Abnormal gait, Decreased activity tolerance, Decreased balance, Decreased range of motion, Decreased mobility, Difficulty walking, Decreased strength, Increased fascial restricitons, Increased muscle spasms, Pain,  Postural dysfunction  Visit Diagnosis: Chronic left-sided low back pain, with sciatica presence unspecified  Muscle weakness (generalized)  Difficulty in walking, not elsewhere classified     Problem List Patient Active Problem List   Diagnosis Date Noted  . Post laminectomy syndrome 03/19/2017  . Chronic pain syndrome 03/19/2017  . Chronic bilateral low back pain without sciatica 03/19/2017  . Bradycardia 05/31/2015  . Pacemaker 05/31/2015  . Peripheral edema 05/31/2015  . Heart murmur 05/31/2015  . Sick sinus syndrome ([place]) 05/31/2015  . Nonsustained ventricular tachycardia ([place]) 05/31/2015  . Follicular lymphoma ([place]) 04/02/2015    Zannie Cove, PT 09/01/2017, 11:43 AM  [facility] [address], [place], [place], [address] Phone: [phone]   Fax:  [phone]  Name: Jacob Warner MRN: [id number] Date of Birth: January 13, 1924

## 2017-09-04 ENCOUNTER — Ambulatory Visit: Payer: Medicare Other | Admitting: Physical Therapy

## 2017-09-04 DIAGNOSIS — M545 Low back pain: Secondary | ICD-10-CM | POA: Diagnosis not present

## 2017-09-04 DIAGNOSIS — G8929 Other chronic pain: Secondary | ICD-10-CM

## 2017-09-04 DIAGNOSIS — R262 Difficulty in walking, not elsewhere classified: Secondary | ICD-10-CM

## 2017-09-04 DIAGNOSIS — M6281 Muscle weakness (generalized): Secondary | ICD-10-CM

## 2017-09-04 NOTE — Therapy (Signed)
[facility] [address], [place], [place], [address] Phone: [phone]   Fax:  [phone]  Physical Therapy Treatment/Recertification  Patient Details  Name: Jacob Warner MRN: [id number] Date of Birth: 08/19/1924 Referring Provider: Magnus Sinning, MD  Encounter Date: 09/04/2017      PT End of Session - 09/04/17 1054    Visit Number 12   Number of Visits 20   Date for PT Re-Evaluation 10/02/17   Authorization Type medicare gcodes at 20 visits   PT Start Time 1015   PT Stop Time 1056   PT Time Calculation (min) 41 min   Activity Tolerance Patient tolerated treatment well      Past Medical History:  Diagnosis Date  . A-fib ([place])   . Alopecia   . Anemia   . Bradycardia   . Cancer ([place])   . Cherry angioma   . GERD (gastroesophageal reflux disease)   . Glaucoma   . Hypertension   . Pelvic fracture ([place])   . Right BBB/left ant fasc block   . Sick sinus syndrome ([place])   . TIA (transient ischemic attack)   . Xeroderma     Past Surgical History:  Procedure Laterality Date  . APPENDECTOMY    . CERVICAL SPINE SURGERY    . EXPLORATORY LAPAROTOMY    . LITHOTRIPSY    . PACEMAKER PLACEMENT      There were no vitals filed for this visit.      Subjective Assessment - 09/04/17 1018    Subjective  I"m in terrible pain.  I have to sit just right or it hurts.  I don't think it is going to get better.  They say they can't do surgery b/c I'm too old.     Pertinent History Goes by [name]"   Currently in Pain? Yes   Pain Score 9    Pain Location Back   Pain Orientation Left   Pain Type Chronic pain   Aggravating Factors  standing or walking   Pain Relieving Factors sitting or lying down            [facility] PT Assessment - 09/04/17 0001      Observation/Other Assessments   Focus on Therapeutic Outcomes (FOTO)  next visit     Strength   Overall Strength Comments Core strength 4-/5;  hip 4-/5;   knees 4-/5                     [facility] Adult PT Treatment/Exercise - 09/04/17 0001      Therapeutic Activites    ADL's core and LE strength needed for standing and walking     Neuro Re-ed    Neuro Re-ed Details  verbal and tactile cues for core/TrA contraciton with exercises     Lumbar Exercises: Stretches   Active Hamstring Stretch 3 reps;20 seconds  Seated today     Lumbar Exercises: Aerobic   Stationary Bike Nustep L2x 10 min   PT present to monitor     Lumbar Exercises: Seated   Long Arc Quad on Chair Strengthening;Right;Left;15 reps   LAQ on Chair Weights (lbs) 5   Hip Flexion on Ball --  seated green band pull downs 15x   Hip Flexion on Ball Limitations trunk extension with green band 15x   Sit to Stand Limitations foam roll push down 16x     Lumbar Exercises: Supine   Clam 20 reps   Clam Limitations green band  Straight Leg Raises Limitations long leg lengthener 5x right left   Other Supine Lumbar Exercises whole leg press down 10x right/left   Other Supine Lumbar Exercises green band hip pulldowns with therapist holding                  PT Short Term Goals - 09/04/17 1206      PT SHORT TERM GOAL #1   Title demonstrates improved gait with more even stride and less lateral lean to the right side   Status Achieved     PT SHORT TERM GOAL #2   Title ind with initial HEP   Status Achieved     PT SHORT TERM GOAL #3   Title reports able to have some reduction in pain with stretches   Status Achieved           PT Long Term Goals - 09/04/17 1206      PT LONG TERM GOAL #1   Title ind with advanced HEP   Time 8   Period Weeks   Status On-going     PT LONG TERM GOAL #2   Title FOTO < or = to 59%   Time 8   Period Weeks   Status On-going     PT LONG TERM GOAL #3   Title reports 50% less pain when standing and walking 5 minutes   Time 8   Period Weeks   Status On-going     PT LONG TERM GOAL #4   Title able to stand and walk at  lease 8 minutes with LRAD due to increased strength and endurance   Time 8   Period Weeks   Status On-going               Plan - 09/04/17 1054    Clinical Impression Statement The patient continues to complain of severe pain although in supine and sitting he is able to perform exercises without visible pain behaviors.  He demonstrates good compliance and carryover with previously given HEP.   Therapist closely monitoring response with all.     Rehab Potential Good   Clinical Impairments Affecting Rehab Potential chronic pain;  lumbar pain stimulator   PT Frequency 2x / week   PT Duration 4 weeks   PT Treatment/Interventions ADLs/Self Care Home Management;Cryotherapy;Electrical Stimulation;Functional mobility training;Stair training;Gait training;Moist Heat;Therapeutic activities;Therapeutic exercise;Patient/family education;Passive range of motion;Manual techniques;Dry needling;Taping   PT Next Visit Plan continue to progress core strength, gradually progress to standing as pain allows;  do FOTO (missed on 10th visit)      Patient will benefit from skilled therapeutic intervention in order to improve the following deficits and impairments:  Abnormal gait, Decreased activity tolerance, Decreased balance, Decreased range of motion, Decreased mobility, Difficulty walking, Decreased strength, Increased fascial restricitons, Increased muscle spasms, Pain, Postural dysfunction  Visit Diagnosis: Chronic left-sided low back pain, with sciatica presence unspecified - Plan: PT plan of care cert/re-cert  Muscle weakness (generalized) - Plan: PT plan of care cert/re-cert  Difficulty in walking, not elsewhere classified - Plan: PT plan of care cert/re-cert     Problem List Patient Active Problem List   Diagnosis Date Noted  . Post laminectomy syndrome 03/19/2017  . Chronic pain syndrome 03/19/2017  . Chronic bilateral low back pain without sciatica 03/19/2017  . Bradycardia 05/31/2015   . Pacemaker 05/31/2015  . Peripheral edema 05/31/2015  . Heart murmur 05/31/2015  . Sick sinus syndrome ([place]) 05/31/2015  . Nonsustained ventricular tachycardia ([place]) 05/31/2015  . Follicular  lymphoma ([place]) 04/02/2015   Ruben Im, PT 09/04/17 12:14 PM Phone: [phone] Fax: [phone]  Alvera Singh 09/04/2017, 12:13 PM  [facility] [address] [place], [place], [address] Phone: [phone]   Fax:  [phone]  Name: Jacob Warner MRN: [id number] Date of Birth: [birth year]

## 2017-09-04 NOTE — Therapy (Signed)
[facility] [address] [place], [place], [address] Phone: [phone]   Fax:  [phone]  Physical Therapy Treatment  Patient Details  Name: Jacob Warner MRN: [id number] Date of Birth: November 17, 1924 Referring Provider: Magnus Sinning, MD  Encounter Date: 09/04/2017      PT End of Session - 09/04/17 1054    Visit Number 12   Number of Visits 20   Date for PT Re-Evaluation 09/09/17   Authorization Type medicare gcodes at 20 visits   PT Start Time 1015   PT Stop Time 1056   PT Time Calculation (min) 41 min   Activity Tolerance Patient tolerated treatment well      Past Medical History:  Diagnosis Date  . A-fib ([place])   . Alopecia   . Anemia   . Bradycardia   . Cancer ([place])   . Cherry angioma   . GERD (gastroesophageal reflux disease)   . Glaucoma   . Hypertension   . Pelvic fracture ([place])   . Right BBB/left ant fasc block   . Sick sinus syndrome ([place])   . TIA (transient ischemic attack)   . Xeroderma     Past Surgical History:  Procedure Laterality Date  . APPENDECTOMY    . CERVICAL SPINE SURGERY    . EXPLORATORY LAPAROTOMY    . LITHOTRIPSY    . PACEMAKER PLACEMENT      There were no vitals filed for this visit.      Subjective Assessment - 09/04/17 1018    Subjective  I"m in terrible pain.  I have to sit just right or it hurts.  I don't think it is going to get better.  They say they can't do surgery b/c I'm too old.     Pertinent History Goes by [name]"   Currently in Pain? Yes   Pain Score 9    Pain Location Back   Pain Orientation Left   Pain Type Chronic pain   Aggravating Factors  standing or walking   Pain Relieving Factors sitting or lying down                         [facility] Adult PT Treatment/Exercise - 09/04/17 0001      Therapeutic Activites    ADL's core and LE strength needed for standing and walking     Neuro Re-ed    Neuro Re-ed Details  verbal and  tactile cues for core/TrA contraciton with exercises     Lumbar Exercises: Stretches   Active Hamstring Stretch 3 reps;20 seconds  Seated today     Lumbar Exercises: Aerobic   Stationary Bike Nustep L2x 10 min   PT present to monitor     Lumbar Exercises: Seated   Long Arc Quad on Chair Strengthening;Right;Left;15 reps   LAQ on Chair Weights (lbs) 5   Hip Flexion on Ball --  seated green band pull downs 15x   Hip Flexion on Ball Limitations trunk extension with green band 15x   Sit to Stand Limitations foam roll push down 16x     Lumbar Exercises: Supine   Clam 20 reps   Clam Limitations green band   Straight Leg Raises Limitations long leg lengthener 5x right left   Other Supine Lumbar Exercises whole leg press down 10x right/left   Other Supine Lumbar Exercises green band hip pulldowns with therapist holding  PT Short Term Goals - 09/04/17 1206      PT SHORT TERM GOAL #1   Title demonstrates improved gait with more even stride and less lateral lean to the right side   Status Achieved     PT SHORT TERM GOAL #2   Title ind with initial HEP   Status Achieved     PT SHORT TERM GOAL #3   Title reports able to have some reduction in pain with stretches   Status Achieved           PT Long Term Goals - 09/04/17 1206      PT LONG TERM GOAL #1   Title ind with advanced HEP   Time 8   Period Weeks   Status On-going     PT LONG TERM GOAL #2   Title FOTO < or = to 59%   Time 8   Period Weeks   Status On-going     PT LONG TERM GOAL #3   Title reports 50% less pain when standing and walking 5 minutes   Time 8   Period Weeks   Status On-going     PT LONG TERM GOAL #4   Title able to stand and walk at lease 8 minutes with LRAD due to increased strength and endurance   Time 8   Period Weeks   Status On-going               Plan - 09/04/17 1054    Clinical Impression Statement The patient continues to complain of severe pain  although in supine and sitting he is able to perform exercises without visible pain behaviors.  He demonstrates good compliance and carryover with previously given HEP.   Therapist closely monitoring response with all.     Rehab Potential Good   Clinical Impairments Affecting Rehab Potential chronic pain;  lumbar pain stimulator   PT Frequency 2x / week   PT Duration 8 weeks   PT Treatment/Interventions ADLs/Self Care Home Management;Cryotherapy;Electrical Stimulation;Functional mobility training;Stair training;Gait training;Moist Heat;Therapeutic activities;Therapeutic exercise;Patient/family education;Passive range of motion;Manual techniques;Dry needling;Taping   PT Next Visit Plan continue to progress core strength, gradually progress to standing as pain allows;  do FOTO (missed on 10th visit)      Patient will benefit from skilled therapeutic intervention in order to improve the following deficits and impairments:  Abnormal gait, Decreased activity tolerance, Decreased balance, Decreased range of motion, Decreased mobility, Difficulty walking, Decreased strength, Increased fascial restricitons, Increased muscle spasms, Pain, Postural dysfunction  Visit Diagnosis: Chronic left-sided low back pain, with sciatica presence unspecified  Muscle weakness (generalized)  Difficulty in walking, not elsewhere classified     Problem List Patient Active Problem List   Diagnosis Date Noted  . Post laminectomy syndrome 03/19/2017  . Chronic pain syndrome 03/19/2017  . Chronic bilateral low back pain without sciatica 03/19/2017  . Bradycardia 05/31/2015  . Pacemaker 05/31/2015  . Peripheral edema 05/31/2015  . Heart murmur 05/31/2015  . Sick sinus syndrome ([place]) 05/31/2015  . Nonsustained ventricular tachycardia ([place]) 05/31/2015  . Follicular lymphoma ([place]) 04/02/2015    Alvera Singh 09/04/2017, 12:08 PM  [facility] [address] [place], [place], [address] Phone: [phone]   Fax:  [phone]  Name: GAGANDEEP PETTET MRN: [id number] Date of Birth: 24-Jan-1924

## 2017-09-07 ENCOUNTER — Ambulatory Visit: Payer: Medicare Other | Admitting: Physical Therapy

## 2017-09-07 ENCOUNTER — Encounter: Payer: Self-pay | Admitting: Physical Therapy

## 2017-09-07 DIAGNOSIS — M6281 Muscle weakness (generalized): Secondary | ICD-10-CM

## 2017-09-07 DIAGNOSIS — M545 Low back pain: Principal | ICD-10-CM

## 2017-09-07 DIAGNOSIS — G8929 Other chronic pain: Secondary | ICD-10-CM

## 2017-09-07 DIAGNOSIS — R262 Difficulty in walking, not elsewhere classified: Secondary | ICD-10-CM

## 2017-09-07 NOTE — Therapy (Signed)
[facility] [address], [place], [place], [address] Phone: [phone]   Fax:  [phone]  Physical Therapy Treatment  Patient Details  Name: Jacob Warner MRN: [id number] Date of Birth: [birth year] Referring Provider: Magnus Sinning, MD  Encounter Date: 09/07/2017      PT End of Session - 09/07/17 1058    Visit Number 13   Number of Visits 20   Date for PT Re-Evaluation 10/02/17   Authorization Type medicare gcodes at 62 visits   PT Start Time 4235   PT Stop Time 1138   PT Time Calculation (min) 40 min   Activity Tolerance Patient tolerated treatment well   Behavior During Therapy [facility] for tasks assessed/performed      Past Medical History:  Diagnosis Date  . A-fib ([place])   . Alopecia   . Anemia   . Bradycardia   . Cancer ([place])   . Cherry angioma   . GERD (gastroesophageal reflux disease)   . Glaucoma   . Hypertension   . Pelvic fracture ([place])   . Right BBB/left ant fasc block   . Sick sinus syndrome ([place])   . TIA (transient ischemic attack)   . Xeroderma     Past Surgical History:  Procedure Laterality Date  . APPENDECTOMY    . CERVICAL SPINE SURGERY    . EXPLORATORY LAPAROTOMY    . LITHOTRIPSY    . PACEMAKER PLACEMENT      There were no vitals filed for this visit.                       [facility] Adult PT Treatment/Exercise - 09/07/17 0001      Lumbar Exercises: Aerobic   Stationary Bike Nustep L2 x 15 min  Completed FOTO with Pt  concurrent     Lumbar Exercises: Standing   Heel Raises 20 reps  VC for quad contraction to keep knees straight   Other Standing Lumbar Exercises Hip abd 10x bil  VC to make lift smaller     Lumbar Exercises: Seated   Long Arc Quad on Chair Strengthening;Both;2 sets;10 reps;Weights   LAQ on Chair Weights (lbs) 5   Hip Flexion on Ball --  In chair 5# 20x bil   Sit to Stand Limitations foam roll push down 16x     Lumbar Exercises:  Supine   Clam 20 reps  2 sets   Clam Limitations green band  Seated                  PT Short Term Goals - 09/04/17 1206      PT SHORT TERM GOAL #1   Title demonstrates improved gait with more even stride and less lateral lean to the right side   Status Achieved     PT SHORT TERM GOAL #2   Title ind with initial HEP   Status Achieved     PT SHORT TERM GOAL #3   Title reports able to have some reduction in pain with stretches   Status Achieved           PT Long Term Goals - 09/07/17 1105      PT LONG TERM GOAL #3   Title reports 50% less pain when standing and walking 5 minutes   Time 8   Period Weeks   Status On-going     PT LONG TERM GOAL #4   Title able to stand and walk at lease 8 minutes  with LRAD due to increased strength and endurance   Time 8   Period Weeks   Status On-going               Plan - 09/07/17 1104    Clinical Impression Statement Pt continues to well with mainly seated exercises although he did tolerate a little bit of standing. He is prepared for discharge next week.    Rehab Potential Good   Clinical Impairments Affecting Rehab Potential chronic pain;  lumbar pain stimulator   PT Frequency 2x / week   PT Duration 4 weeks   PT Treatment/Interventions ADLs/Self Care Home Management;Cryotherapy;Electrical Stimulation;Functional mobility training;Stair training;Gait training;Moist Heat;Therapeutic activities;Therapeutic exercise;Patient/family education;Passive range of motion;Manual techniques;Dry needling;Taping   PT Next Visit Plan continue to progress core strength, gradually progress to standing as pain allows;   Consulted and Agree with Plan of Care Patient      Patient will benefit from skilled therapeutic intervention in order to improve the following deficits and impairments:  Abnormal gait, Decreased activity tolerance, Decreased balance, Decreased range of motion, Decreased mobility, Difficulty walking, Decreased  strength, Increased fascial restricitons, Increased muscle spasms, Pain, Postural dysfunction  Visit Diagnosis: Chronic left-sided low back pain, with sciatica presence unspecified  Muscle weakness (generalized)  Difficulty in walking, not elsewhere classified     Problem List Patient Active Problem List   Diagnosis Date Noted  . Post laminectomy syndrome 03/19/2017  . Chronic pain syndrome 03/19/2017  . Chronic bilateral low back pain without sciatica 03/19/2017  . Bradycardia 05/31/2015  . Pacemaker 05/31/2015  . Peripheral edema 05/31/2015  . Heart murmur 05/31/2015  . Sick sinus syndrome ([place]) 05/31/2015  . Nonsustained ventricular tachycardia ([place]) 05/31/2015  . Follicular lymphoma ([place]) 04/02/2015    [name], PTA 09/07/2017, 11:42 AM  [facility] [address] [place], [place], [address] Phone: [phone]   Fax:  [phone]  Name: Jacob Warner MRN: [id number] Date of Birth: 08-19-1924

## 2017-09-10 ENCOUNTER — Ambulatory Visit: Payer: Medicare Other | Admitting: Physical Therapy

## 2017-09-10 DIAGNOSIS — R262 Difficulty in walking, not elsewhere classified: Secondary | ICD-10-CM

## 2017-09-10 DIAGNOSIS — M6281 Muscle weakness (generalized): Secondary | ICD-10-CM

## 2017-09-10 DIAGNOSIS — G8929 Other chronic pain: Secondary | ICD-10-CM

## 2017-09-10 DIAGNOSIS — M545 Low back pain: Secondary | ICD-10-CM | POA: Diagnosis not present

## 2017-09-10 NOTE — Therapy (Signed)
[facility] [address], [place], [place], [address] Phone: [phone]   Fax:  [phone]  Physical Therapy Treatment  Patient Details  Name: Jacob Warner MRN: [id number] Date of Birth: Jun 20, 1924 Referring Provider: Magnus Sinning, MD  Encounter Date: 09/10/2017      PT End of Session - 09/10/17 1505    Visit Number 14   Number of Visits 20   Date for PT Re-Evaluation 10/02/17   Authorization Type medicare gcodes at 20 visits   PT Start Time 6644   PT Stop Time 1525   PT Time Calculation (min) 40 min   Activity Tolerance Patient tolerated treatment well      Past Medical History:  Diagnosis Date  . A-fib ([place])   . Alopecia   . Anemia   . Bradycardia   . Cancer ([place])   . Cherry angioma   . GERD (gastroesophageal reflux disease)   . Glaucoma   . Hypertension   . Pelvic fracture ([place])   . Right BBB/left ant fasc block   . Sick sinus syndrome ([place])   . TIA (transient ischemic attack)   . Xeroderma     Past Surgical History:  Procedure Laterality Date  . APPENDECTOMY    . CERVICAL SPINE SURGERY    . EXPLORATORY LAPAROTOMY    . LITHOTRIPSY    . PACEMAKER PLACEMENT      There were no vitals filed for this visit.      Subjective Assessment - 09/10/17 1447    Subjective I'm still a "9/10" in pain.  "Nothings going to help this pain."  Going to see the doctor on 9/20 to discuss other alternatives.  It causes me to lean over.     Pertinent History Goes by [name]"   Currently in Pain? Yes   Pain Score 9    Pain Location Back   Pain Orientation Left   Pain Type Chronic pain   Aggravating Factors  standing or walking   Pain Relieving Factors sitting or lying down                         [facility] Adult PT Treatment/Exercise - 09/10/17 0001      Therapeutic Activites    ADL's core and LE strength needed for standing and walking     Neuro Re-ed    Neuro Re-ed Details   verbal and tactile cues for core/TrA contraciton with exercises     Lumbar Exercises: Aerobic   Stationary Bike Nustep L2 x 15 min  Completed FOTO with Pt  concurrent     Lumbar Exercises: Standing   Heel Raises 20 reps  VC for quad contraction to keep knees straight   Forward Lunge Limitations 10x on 2nd step each leg   Shoulder Extension Limitations weight shift side to side 10x   Other Standing Lumbar Exercises Hip abd 10x bil  VC to make lift smaller   Other Standing Lumbar Exercises hip extension 10x bil     Lumbar Exercises: Seated   Long Arc Quad on Chair Strengthening;Right;Left;15 reps   LAQ on Chair Limitations red band   LAQ on Ball Limitations green band clams 20x   Hip Flexion on Ball Limitations trunk extension with blue band 15x   Sit to Stand Limitations foam roll push down 16x                  PT Short Term Goals - 09/10/17  [place] #1   Title demonstrates improved gait with more even stride and less lateral lean to the right side   Status Achieved     PT SHORT TERM GOAL #2   Title ind with initial HEP   Status Achieved     PT SHORT TERM GOAL #3   Title reports able to have some reduction in pain with stretches   Status Achieved           PT Long Term Goals - 09/10/17 1514      PT LONG TERM GOAL #1   Title ind with advanced HEP   Time 8   Period Weeks   Status On-going     PT LONG TERM GOAL #2   Title FOTO < or = to 59%   Time 8   Period Weeks   Status On-going     PT LONG TERM GOAL #3   Title reports 50% less pain when standing and walking 5 minutes   Time 8   Period Weeks   Status On-going     PT LONG TERM GOAL #4   Title able to stand and walk at lease 8 minutes with LRAD due to increased strength and endurance   Time 8   Period Weeks   Status On-going               Plan - 09/10/17 1505    Clinical Impression Statement Patient reports no change in his "severe" pain level.  Pain tolerable  in sitting and lying.  He is able to perform minimal standing exercises with some increase in pain intensity with bil UE support on railings.  He is going to see the doctor on the 20th to see if anything else can be done to relieve his pain.     Rehab Potential Good   Clinical Impairments Affecting Rehab Potential chronic pain;  lumbar pain stimulator   PT Frequency 2x / week   PT Duration 4 weeks   PT Treatment/Interventions ADLs/Self Care Home Management;Cryotherapy;Electrical Stimulation;Functional mobility training;Stair training;Gait training;Moist Heat;Therapeutic activities;Therapeutic exercise;Patient/family education;Passive range of motion;Manual techniques;Dry needling;Taping   PT Next Visit Plan continue to progress core strength, gradually progress to standing as pain allows; KX modifiers next vist      Patient will benefit from skilled therapeutic intervention in order to improve the following deficits and impairments:  Abnormal gait, Decreased activity tolerance, Decreased balance, Decreased range of motion, Decreased mobility, Difficulty walking, Decreased strength, Increased fascial restricitons, Increased muscle spasms, Pain, Postural dysfunction  Visit Diagnosis: Chronic left-sided low back pain, with sciatica presence unspecified  Muscle weakness (generalized)  Difficulty in walking, not elsewhere classified     Problem List Patient Active Problem List   Diagnosis Date Noted  . Post laminectomy syndrome 03/19/2017  . Chronic pain syndrome 03/19/2017  . Chronic bilateral low back pain without sciatica 03/19/2017  . Bradycardia 05/31/2015  . Pacemaker 05/31/2015  . Peripheral edema 05/31/2015  . Heart murmur 05/31/2015  . Sick sinus syndrome ([place]) 05/31/2015  . Nonsustained ventricular tachycardia ([place]) 05/31/2015  . Follicular lymphoma ([place]) 04/02/2015   Ruben Im, PT 09/10/17 3:30 PM Phone: [phone] Fax: [phone]  Alvera Singh 09/10/2017,  3:30 PM  [facility] [address] [place], [place], [address] Phone: [phone]   Fax:  [phone]  Name: Jacob Warner MRN: [id number] Date of Birth: March 31, 1924

## 2017-09-11 ENCOUNTER — Encounter: Payer: Medicare Other | Admitting: Physical Therapy

## 2017-09-13 ENCOUNTER — Other Ambulatory Visit (INDEPENDENT_AMBULATORY_CARE_PROVIDER_SITE_OTHER): Payer: Self-pay | Admitting: Specialist

## 2017-09-14 ENCOUNTER — Encounter: Payer: Self-pay | Admitting: Physical Therapy

## 2017-09-14 ENCOUNTER — Ambulatory Visit: Payer: Medicare Other | Admitting: Physical Therapy

## 2017-09-14 DIAGNOSIS — G8929 Other chronic pain: Secondary | ICD-10-CM

## 2017-09-14 DIAGNOSIS — R262 Difficulty in walking, not elsewhere classified: Secondary | ICD-10-CM

## 2017-09-14 DIAGNOSIS — M545 Low back pain: Secondary | ICD-10-CM | POA: Diagnosis not present

## 2017-09-14 DIAGNOSIS — M6281 Muscle weakness (generalized): Secondary | ICD-10-CM

## 2017-09-14 NOTE — Telephone Encounter (Signed)
Gabapentin refill request 

## 2017-09-14 NOTE — Therapy (Signed)
[facility] [address] [place], [place], [address] Phone: [phone]   Fax:  [phone]  Physical Therapy Treatment  Patient Details  Name: Jacob Warner MRN: [id number] Date of Birth: [birth year] Referring Provider: Magnus Sinning, MD  Encounter Date: 09/14/2017      PT End of Session - 09/14/17 1109    Visit Number 15   Number of Visits 20   Date for PT Re-Evaluation 10/02/17   Authorization Type medicare gcodes at 20 visits   PT Start Time 6387   PT Stop Time 1136   PT Time Calculation (min) 38 min   Activity Tolerance Patient tolerated treatment well   Behavior During Therapy [facility] for tasks assessed/performed      Past Medical History:  Diagnosis Date  . A-fib ([place])   . Alopecia   . Anemia   . Bradycardia   . Cancer ([place])   . Cherry angioma   . GERD (gastroesophageal reflux disease)   . Glaucoma   . Hypertension   . Pelvic fracture ([place])   . Right BBB/left ant fasc block   . Sick sinus syndrome ([place])   . TIA (transient ischemic attack)   . Xeroderma     Past Surgical History:  Procedure Laterality Date  . APPENDECTOMY    . CERVICAL SPINE SURGERY    . EXPLORATORY LAPAROTOMY    . LITHOTRIPSY    . PACEMAKER PLACEMENT      There were no vitals filed for this visit.      Subjective Assessment - 09/14/17 1111    Subjective I am going to do water exercises when I am finished here this week.    Currently in Pain? No/denies   Multiple Pain Sites No                         [facility] Adult PT Treatment/Exercise - 09/14/17 0001      Lumbar Exercises: Aerobic   Stationary Bike Nustep L3 x 15 min     Lumbar Exercises: Standing   Heel Raises 20 reps  VC for quad contraction to keep knees straight   Other Standing Lumbar Exercises Hip abd 10x bil  VC to make lift smaller   Other Standing Lumbar Exercises Knee bends bil 10x      Lumbar Exercises: Seated   Long Arc Quad  on Chair Strengthening;Right;Left;15 reps   LAQ on Chair Weights (lbs) 5   LAQ on [place] Limitations green band clams 25x   Hip Flexion on Ball --  In chair 5# 20x bil                  PT Short Term Goals - 09/10/17 1514      PT SHORT TERM GOAL #1   Title demonstrates improved gait with more even stride and less lateral lean to the right side   Status Achieved     PT SHORT TERM GOAL #2   Title ind with initial HEP   Status Achieved     PT SHORT TERM GOAL #3   Title reports able to have some reduction in pain with stretches   Status Achieved           PT Long Term Goals - 09/10/17 1514      PT LONG TERM GOAL #1   Title ind with advanced HEP   Time 8   Period Weeks   Status On-going     PT  LONG TERM GOAL #2   Title FOTO < or = to 59%   Time 8   Period Weeks   Status On-going     PT LONG TERM GOAL #3   Title reports 50% less pain when standing and walking 5 minutes   Time 8   Period Weeks   Status On-going     PT LONG TERM GOAL #4   Title able to stand and walk at lease 8 minutes with LRAD due to increased strength and endurance   Time 8   Period Weeks   Status On-going               Plan - 09/14/17 1109    Clinical Impression Statement Pt is planning to continue with water exercises so he can perform some standing exercises with the least amount of pain possible.    Rehab Potential Good   Clinical Impairments Affecting Rehab Potential chronic pain;  lumbar pain stimulator   PT Frequency 2x / week   PT Duration 4 weeks   PT Treatment/Interventions ADLs/Self Care Home Management;Cryotherapy;Electrical Stimulation;Functional mobility training;Stair training;Gait training;Moist Heat;Therapeutic activities;Therapeutic exercise;Patient/family education;Passive range of motion;Manual techniques;Dry needling;Taping   PT Next Visit Plan Discharge next visit, FOTO   Consulted and Agree with Plan of Care --      Patient will benefit from skilled  therapeutic intervention in order to improve the following deficits and impairments:  Abnormal gait, Decreased activity tolerance, Decreased balance, Decreased range of motion, Decreased mobility, Difficulty walking, Decreased strength, Increased fascial restricitons, Increased muscle spasms, Pain, Postural dysfunction  Visit Diagnosis: Chronic left-sided low back pain, with sciatica presence unspecified  Muscle weakness (generalized)  Difficulty in walking, not elsewhere classified     Problem List Patient Active Problem List   Diagnosis Date Noted  . Post laminectomy syndrome 03/19/2017  . Chronic pain syndrome 03/19/2017  . Chronic bilateral low back pain without sciatica 03/19/2017  . Bradycardia 05/31/2015  . Pacemaker 05/31/2015  . Peripheral edema 05/31/2015  . Heart murmur 05/31/2015  . Sick sinus syndrome ([place]) 05/31/2015  . Nonsustained ventricular tachycardia ([place]) 05/31/2015  . Follicular lymphoma ([place]) 04/02/2015    COCHRAN,JENNIFER, PTA 09/14/2017, 11:32 AM  [facility] [address] [place], [place], [address] Phone: [phone]   Fax:  [phone]  Name: Jacob Warner MRN: [id number] Date of Birth: Dec 07, 1924

## 2017-09-15 ENCOUNTER — Ambulatory Visit (INDEPENDENT_AMBULATORY_CARE_PROVIDER_SITE_OTHER): Payer: Medicare Other | Admitting: *Deleted

## 2017-09-15 DIAGNOSIS — I495 Sick sinus syndrome: Secondary | ICD-10-CM

## 2017-09-15 NOTE — Progress Notes (Signed)
Remote pacemaker transmission.   

## 2017-09-17 ENCOUNTER — Encounter: Payer: Self-pay | Admitting: Cardiology

## 2017-09-17 ENCOUNTER — Ambulatory Visit (INDEPENDENT_AMBULATORY_CARE_PROVIDER_SITE_OTHER): Payer: Medicare Other | Admitting: Physical Medicine and Rehabilitation

## 2017-09-17 ENCOUNTER — Encounter (INDEPENDENT_AMBULATORY_CARE_PROVIDER_SITE_OTHER): Payer: Self-pay | Admitting: Physical Medicine and Rehabilitation

## 2017-09-17 VITALS — BP 134/74 | HR 70

## 2017-09-17 DIAGNOSIS — M47816 Spondylosis without myelopathy or radiculopathy, lumbar region: Secondary | ICD-10-CM | POA: Diagnosis not present

## 2017-09-17 DIAGNOSIS — G8929 Other chronic pain: Secondary | ICD-10-CM | POA: Diagnosis not present

## 2017-09-17 DIAGNOSIS — Z9689 Presence of other specified functional implants: Secondary | ICD-10-CM | POA: Diagnosis not present

## 2017-09-17 DIAGNOSIS — M961 Postlaminectomy syndrome, not elsewhere classified: Secondary | ICD-10-CM

## 2017-09-17 DIAGNOSIS — M545 Low back pain, unspecified: Secondary | ICD-10-CM

## 2017-09-17 NOTE — Progress Notes (Deleted)
Pain "runs under battery" of spinal cord stimulator. Radiates toward left buttock.

## 2017-09-18 ENCOUNTER — Ambulatory Visit: Payer: Medicare Other | Admitting: Physical Therapy

## 2017-09-18 DIAGNOSIS — R262 Difficulty in walking, not elsewhere classified: Secondary | ICD-10-CM

## 2017-09-18 DIAGNOSIS — M545 Low back pain: Principal | ICD-10-CM

## 2017-09-18 DIAGNOSIS — M6281 Muscle weakness (generalized): Secondary | ICD-10-CM

## 2017-09-18 DIAGNOSIS — G8929 Other chronic pain: Secondary | ICD-10-CM

## 2017-09-18 NOTE — Therapy (Signed)
[facility] [address] [place], [place], [address] Phone: [phone]   Fax:  [phone]  Physical Therapy Treatment  Patient Details  Name: Jacob Warner MRN: [id number] Date of Birth: [birth year] Referring Provider: Magnus Sinning, MD  Encounter Date: 09/18/2017      PT End of Session - 09/18/17 1025    Visit Number 16   Number of Visits 20   Date for PT Re-Evaluation 10/02/17   Authorization Type medicare gcodes at 20 visits   PT Start Time 1015   PT Stop Time 1057   PT Time Calculation (min) 42 min   Activity Tolerance Patient tolerated treatment well   Behavior During Therapy [facility] for tasks assessed/performed      Past Medical History:  Diagnosis Date  . A-fib ([place])   . Alopecia   . Anemia   . Bradycardia   . Cancer ([place])   . Cherry angioma   . GERD (gastroesophageal reflux disease)   . Glaucoma   . Hypertension   . Pelvic fracture ([place])   . Right BBB/left ant fasc block   . Sick sinus syndrome ([place])   . TIA (transient ischemic attack)   . Xeroderma     Past Surgical History:  Procedure Laterality Date  . APPENDECTOMY    . CERVICAL SPINE SURGERY    . EXPLORATORY LAPAROTOMY    . LITHOTRIPSY    . PACEMAKER PLACEMENT      There were no vitals filed for this visit.      Subjective Assessment - 09/18/17 1020    Subjective I have had a big improvement of how much I can get around and do things.   Limitations Standing;Walking   How long can you stand comfortably? 5 minutes-10 min    How long can you walk comfortably? 5-10 min   Patient Stated Goals walk more and not bending over as much   Currently in Pain? No/denies                         [facility] Adult PT Treatment/Exercise - 09/18/17 0001      Lumbar Exercises: Aerobic   Stationary Bike Nustep L3 x 15 min     Lumbar Exercises: Seated   Long Arc Quad on Chair Strengthening;Right;Left;15 reps   LAQ on Chair  Weights (lbs) 5   Sit to Stand Limitations foam roll push down 16x  row green band with core contraction     Lumbar Exercises: Supine   Clam 20 reps  2 sets   Clam Limitations green band  Seated   Other Supine Lumbar Exercises hip Adduction with abdominal brace                  PT Short Term Goals - 09/10/17 1514      PT SHORT TERM GOAL #1   Title demonstrates improved gait with more even stride and less lateral lean to the right side   Status Achieved     PT SHORT TERM GOAL #2   Title ind with initial HEP   Status Achieved     PT SHORT TERM GOAL #3   Title reports able to have some reduction in pain with stretches   Status Achieved           PT Long Term Goals - 09/18/17 1021      PT LONG TERM GOAL #1   Title ind with advanced HEP   Time  8   Period Weeks   Status Achieved     PT LONG TERM GOAL #2   Title FOTO < or = to 59%   Baseline 37% limited   Time 8   Period Weeks   Status Achieved     PT LONG TERM GOAL #3   Title reports 50% less pain when standing and walking 5 minutes   Baseline pain is the same but walking is better   Time 8   Period Weeks   Status Partially Met     PT LONG TERM GOAL #4   Title able to stand and walk at lease 8 minutes with LRAD due to increased strength and endurance   Baseline about 3-5 minutes, just guessing   Time 8   Period Weeks   Status Not Met               Plan - 09/23/17 1037    Clinical Impression Statement Pt will discharge PT and continue to do HEP and start aquatics classes   Rehab Potential Good   PT Treatment/Interventions ADLs/Self Care Home Management;Cryotherapy;Electrical Stimulation;Functional mobility training;Stair training;Gait training;Moist Heat;Therapeutic activities;Therapeutic exercise;Patient/family education;Passive range of motion;Manual techniques;Dry needling;Taping   PT Next Visit Plan discharge today   Consulted and Agree with Plan of Care Patient      Patient will  benefit from skilled therapeutic intervention in order to improve the following deficits and impairments:  Abnormal gait, Decreased activity tolerance, Decreased balance, Decreased range of motion, Decreased mobility, Difficulty walking, Decreased strength, Increased fascial restricitons, Increased muscle spasms, Pain, Postural dysfunction  Visit Diagnosis: Chronic left-sided low back pain, with sciatica presence unspecified  Muscle weakness (generalized)  Difficulty in walking, not elsewhere classified       G-Codes - 09-23-17 1102    Functional Assessment Tool Used (Outpatient Only) FOTO and clnical impression   Functional Limitation Mobility: Walking and moving around   Mobility: Walking and Moving Around Current Status [phone]) At least 20 percent but less than 40 percent impaired, limited or restricted   Mobility: Walking and Moving Around Goal Status [phone]) At least 40 percent but less than 60 percent impaired, limited or restricted   Mobility: Walking and Moving Around Discharge Status [phone]) At least 20 percent but less than 40 percent impaired, limited or restricted      Problem List Patient Active Problem List   Diagnosis Date Noted  . Post laminectomy syndrome 03/19/2017  . Chronic pain syndrome 03/19/2017  . Chronic bilateral low back pain without sciatica 03/19/2017  . Bradycardia 05/31/2015  . Pacemaker 05/31/2015  . Peripheral edema 05/31/2015  . Heart murmur 05/31/2015  . Sick sinus syndrome ([place]) 05/31/2015  . Nonsustained ventricular tachycardia ([place]) 05/31/2015  . Follicular lymphoma ([place]) 04/02/2015    Zannie Cove, PT 2017/09/23, 11:03 AM  [facility] [address] [place], [place], [address] Phone: [phone]   Fax:  [phone]  Name: Jacob Warner MRN: [id number] Date of Birth: [birth year]  PHYSICAL THERAPY DISCHARGE SUMMARY  Visits from Start of Care:16  Current functional  level related to goals / functional outcomes: See above   Remaining deficits: See above   Education / Equipment: HEP Plan: Patient agrees to discharge.  Patient goals were partially met. Patient is being discharged due to being pleased with the current functional level.  ?????        [employer], PT 09/23/2017 11:03 AM

## 2017-09-24 ENCOUNTER — Encounter (INDEPENDENT_AMBULATORY_CARE_PROVIDER_SITE_OTHER): Payer: Self-pay | Admitting: Physical Medicine and Rehabilitation

## 2017-09-24 ENCOUNTER — Ambulatory Visit (INDEPENDENT_AMBULATORY_CARE_PROVIDER_SITE_OTHER): Payer: Medicare Other

## 2017-09-24 ENCOUNTER — Ambulatory Visit (INDEPENDENT_AMBULATORY_CARE_PROVIDER_SITE_OTHER): Payer: Medicare Other | Admitting: Physical Medicine and Rehabilitation

## 2017-09-24 VITALS — BP 140/69 | HR 75

## 2017-09-24 DIAGNOSIS — M545 Low back pain, unspecified: Secondary | ICD-10-CM

## 2017-09-24 DIAGNOSIS — G8929 Other chronic pain: Secondary | ICD-10-CM

## 2017-09-24 DIAGNOSIS — M47816 Spondylosis without myelopathy or radiculopathy, lumbar region: Secondary | ICD-10-CM

## 2017-09-24 MED ORDER — METHYLPREDNISOLONE ACETATE 80 MG/ML IJ SUSP
80.0000 mg | Freq: Once | INTRAMUSCULAR | Status: AC
  Administered 2017-09-24: 80 mg

## 2017-09-24 MED ORDER — LIDOCAINE HCL (PF) 1 % IJ SOLN
2.0000 mL | Freq: Once | INTRAMUSCULAR | Status: AC
  Administered 2017-09-24: 2 mL

## 2017-09-24 NOTE — Progress Notes (Deleted)
Patient is here for planned injection. No change in symptoms.

## 2017-09-24 NOTE — Progress Notes (Signed)
Jacob Warner - 83 y.o. male MRN [id number]  Date of birth: 24-Oct-1924  Office Visit Note: Visit Date: 09/17/2017 PCP: Jacob Small, MD Referred by: Jacob Small, MD  Subjective: Chief Complaint  Patient presents with  . Lower Back - Pain   HPI: Jacob Warner is a pleasant young appearing 83 year old gentleman with a history of lumbar spine pain and mostly axial low back pain. He had undergone prior laminectomy surgery. He was sent to me by Jacob Warner who is been following him for some time. We ultimately put in a spinal cord stimulator for his mostly axial low back pain. The trial lead very successfully and he was ultimately implanted by Jacob Warner. He initially did extremely well with that but over the last several months he has had this persistent left low back and upper buttock pain. No pain down the legs. We initially saw him at one point and felt like it was maybe a trigger point issue. He has had physical therapy with dry needling in good therapy and still has this persistent pain. We have been co-treating his wife unfortunately he was started to have radicular symptoms as well. Fortunately today she reports actually outstanding relief with the last injection we performed that she is very happy at this point with no radicular pain. Jacob Warner has most of his pain with standing and extension. He has a little pain at rest or sitting. He is ambulating with a cane. He reports that he feels like the pain is right underneath the spinal cord generator that is implanted in the left low back. He actually has seen Jacob Warner in follow-up some months ago. We did have one of the [employer] me to [place] today for reprogramming. He has had reprogramming attempt several months ago with some change. He denies any focal weakness or bowel or bladder difficulties. He's had no falls. He has had no specific trauma.    Review of Systems  Constitutional: Negative for chills, fever,  malaise/fatigue and weight loss.  HENT: Negative for hearing loss and sinus pain.   Eyes: Negative for blurred vision, double vision and photophobia.  Respiratory: Negative for cough and shortness of breath.   Cardiovascular: Negative for chest pain, palpitations and leg swelling.  Gastrointestinal: Negative for abdominal pain, nausea and vomiting.  Genitourinary: Negative for flank pain.  Musculoskeletal: Positive for back pain. Negative for myalgias.  Skin: Negative for itching and rash.  Neurological: Negative for tremors, focal weakness and weakness.  Endo/Heme/Allergies: Negative.   Psychiatric/Behavioral: Negative for depression.  All other systems reviewed and are negative.  Otherwise per HPI.  Assessment & Plan: Visit Diagnoses: No diagnosis found.  Plan: Findings:  Chronic long-term history of back pain now with persistent left low back and upper buttock pain. This seems to be more mechanical worse with getting up from a seated position and extension. He has known facet arthropathy and has responded minimally in the past to facet joint blocks. I did look at the area around the generator and there is no swelling or induration. He does have painful areas to palpation but it's deep palpation. He has no pain with hip rotation. We did sit with Jacob Warner while she helped reprogram. We did adjust several programs including various burst stimulation and subthreshold stimulation as well as trying to correct this over this area of pain. He did seem to get a little bit relief with that but was still a mechanical component. I'm  going to have him come back for facet joint block diagnostically at L4-5 and L5-S1 on the left side. Alternatively this could be more of a radicular type of the pain however is not really progressing down the leg and seems more mechanical. Lastly depending on relief he gets up may have him see Jacob Warner to just reevaluate the generator  placement and pocket.    Meds & Orders: No orders of the defined types were placed in this encounter.  No orders of the defined types were placed in this encounter.   Follow-up: Return for Left L4-5 and L5-S1 facet joint block.   Procedures: No procedures performed  No notes on file   Clinical History: Lumbar CT myelogram 04/19/2015 IMPRESSION: 1. Superior endplate fracture at L1 was a vacuum disc at T12-L1. 2. Mild to moderate left subarticular and mild left foraminal stenosis at L1-2. 3. Moderate left and mild right subarticular stenosis at L2-3. There is mild foraminal narrowing at this level bilaterally. 4. Moderate subarticular and mild to moderate foraminal stenosis bilaterally at L3-4. 5. Mild subarticular and moderate foraminal stenosis at L4-5 is worse on the right. 6. Advanced facet hypertrophy and spurring leads to mild to moderate right and mild left foraminal stenosis. The central canal is patent.  He reports that he has never smoked. He has never used smokeless tobacco. No results for input(s): HGBA1C, LABURIC in the last 8760 hours.  Objective:  VS:  HT:    WT:   BMI:     BP:134/74  HR:70bpm  TEMP: ( )  RESP:  Physical Exam  Constitutional: He is oriented to person, place, and time. He appears well-developed and well-nourished. No distress.  HENT:  Head: Normocephalic and atraumatic.  Nose: Nose normal.  Mouth/Throat: Oropharynx is clear and moist.  Eyes: Pupils are equal, round, and reactive to light. Conjunctivae are normal.  Neck: Neck supple.  Decreased range of motion in ranges.  Cardiovascular: Regular rhythm and intact distal pulses.   Pulmonary/Chest: Effort normal and breath sounds normal.  Abdominal: Soft. He exhibits no distension. There is no rebound and no guarding.  Musculoskeletal: He exhibits no deformity.  Patient ambulates with a forward flexed spine. He is very slow to rise from a seated position with very mechanical-type pain on  arising. He has concordant pain with extension rotation to the left of the lumbar spine. He has no pain over the greater trochanters or PSIS. Examining the area around the generator in his lower back shows well-healed surgical scar no induration no swelling no specific pain directly over this area or around. He does have pain to deep palpation in this area. He has good distal strength without clonus.  Neurological: He is alert and oriented to person, place, and time. He exhibits normal muscle tone. Coordination normal.  Skin: Skin is warm. No rash noted.  Psychiatric: He has a normal mood and affect. His behavior is normal.  Nursing note and vitals reviewed.   Ortho Exam Imaging: No results found.  Past Medical/Family/Surgical/Social History: Medications & Allergies reviewed per EMR Patient Active Problem List   Diagnosis Date Noted  . Post laminectomy syndrome 03/19/2017  . Chronic pain syndrome 03/19/2017  . Chronic bilateral low back pain without sciatica 03/19/2017  . Bradycardia 05/31/2015  . Pacemaker 05/31/2015  . Peripheral edema 05/31/2015  . Heart murmur 05/31/2015  . Sick sinus syndrome ([place]) 05/31/2015  . Nonsustained ventricular tachycardia ([place]) 05/31/2015  . 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 A-fib ([place])   . Alopecia   . Anemia   . Bradycardia   . Cancer ([place])   . Cherry angioma   . GERD (gastroesophageal reflux disease)   . Glaucoma   . Hypertension   . Pelvic fracture ([place])   . Right BBB/left ant fasc block   . Sick sinus syndrome ([place])   . TIA (transient ischemic attack)   . Xeroderma    Family History  Problem Relation Age of Onset  . Brain cancer Sister    Past Surgical History:  Procedure Laterality Date  . APPENDECTOMY    . CERVICAL SPINE SURGERY    . EXPLORATORY LAPAROTOMY    . LITHOTRIPSY    . PACEMAKER PLACEMENT     Social History   Occupational History  . Not on file.   Social History  Main Topics  . Smoking status: Never Smoker  . Smokeless tobacco: Never Used  . Alcohol use No  . Drug use: No  . Sexual activity: Not on file

## 2017-09-24 NOTE — Patient Instructions (Signed)

## 2017-09-28 ENCOUNTER — Encounter (INDEPENDENT_AMBULATORY_CARE_PROVIDER_SITE_OTHER): Payer: Self-pay | Admitting: Specialist

## 2017-09-28 ENCOUNTER — Ambulatory Visit (INDEPENDENT_AMBULATORY_CARE_PROVIDER_SITE_OTHER): Payer: Medicare Other | Admitting: Specialist

## 2017-09-28 ENCOUNTER — Ambulatory Visit (INDEPENDENT_AMBULATORY_CARE_PROVIDER_SITE_OTHER): Payer: Medicare Other

## 2017-09-28 VITALS — BP 123/66 | HR 85 | Ht 70.0 in | Wt 219.0 lb

## 2017-09-28 DIAGNOSIS — M25562 Pain in left knee: Secondary | ICD-10-CM

## 2017-09-28 DIAGNOSIS — M170 Bilateral primary osteoarthritis of knee: Secondary | ICD-10-CM | POA: Diagnosis not present

## 2017-09-28 DIAGNOSIS — G8929 Other chronic pain: Secondary | ICD-10-CM

## 2017-09-28 DIAGNOSIS — M25561 Pain in right knee: Secondary | ICD-10-CM

## 2017-09-28 DIAGNOSIS — M48062 Spinal stenosis, lumbar region with neurogenic claudication: Secondary | ICD-10-CM

## 2017-09-28 MED ORDER — TRAMADOL HCL 50 MG PO TABS: 50.0000 mg | ORAL_TABLET | Freq: Four times a day (QID) | ORAL | 0 refills | Status: DC | PRN

## 2017-09-28 NOTE — Progress Notes (Deleted)
   Office Visit Note   Patient: SHEDRIC FREDERICKS           Date of Birth: 1924/10/12           MRN: [id number] Visit Date: 09/28/2017              Requested by: [name], MD [place], [place] [address] PCP: [name], MD   Assessment & Plan: Visit Diagnoses:  1. Chronic pain of both knees     Plan: ***  Follow-Up Instructions: No Follow-up on file.   Orders:  Orders Placed This Encounter  Procedures  . XR Knee 1-2 Views Left  . XR Knee 1-2 Views Right   No orders of the defined types were placed in this encounter.     Procedures: No procedures performed   Clinical Data: No additional findings.   Subjective: Chief Complaint  Patient presents with  . Right Knee - Pain  . Left Knee - Pain    HPI  Review of Systems   Objective: Vital Signs: BP 123/66 (BP Location: Left Arm, Patient Position: Sitting)   Pulse 85   Ht 5\' 10"  (1.778 m)   Wt 219 lb (99.3 kg)   BMI 31.42 kg/m   Physical Exam  Ortho Exam  Specialty Comments:  No specialty comments available.  Imaging: No results found.   PMFS History: Patient Active Problem List   Diagnosis Date Noted  . Post laminectomy syndrome 03/19/2017  . Chronic pain syndrome 03/19/2017  . Chronic bilateral low back pain without sciatica 03/19/2017  . Bradycardia 05/31/2015  . Pacemaker 05/31/2015  . Peripheral edema 05/31/2015  . Heart murmur 05/31/2015  . Sick sinus syndrome ([place]) 05/31/2015  . Nonsustained ventricular tachycardia ([place]) 05/31/2015  . 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 A-fib ([place])   . Alopecia   . Anemia   . Bradycardia   . Cancer ([place])   . Cherry angioma   . GERD (gastroesophageal reflux disease)   . Glaucoma   . Hypertension   . Pelvic fracture ([place])   . Right BBB/left ant fasc block   . Sick sinus syndrome ([place])   . TIA (transient ischemic attack)   . Xeroderma     Family History  Problem  Relation Age of Onset  . Brain cancer Sister     Past Surgical History:  Procedure Laterality Date  . APPENDECTOMY    . CERVICAL SPINE SURGERY    . EXPLORATORY LAPAROTOMY    . LITHOTRIPSY    . PACEMAKER PLACEMENT     Social History   Occupational History  . Not on file.   Social History Main Topics  . Smoking status: Never Smoker  . Smokeless tobacco: Never Used  . Alcohol use No  . Drug use: No  . Sexual activity: Not on file

## 2017-09-28 NOTE — Progress Notes (Addendum)
Office Visit Note   Patient: Jacob Warner           Date of Birth: [birth year]           MRN: [id number] Visit Date: 09/28/2017              Requested by: [name], MD [address] [place], [place] [address] PCP: [name], MD   Assessment & Plan: Visit Diagnoses:  1. Chronic pain of both knees   2. Spinal stenosis of lumbar region with neurogenic claudication   3. 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 He has had short term relief with facet blocks and a history of poor respond to left sided RFA of facets by Dr. Mina Marble of [place]. He is seen today for evaluation of knee pain. Pain with standing and walking. Notes tendency to flex his knees. Clinically excellent ROM of the knees, grating of both patellofemoral joint lines. But radiographically the medial and lateral joint lines are fairly well maintained. The patellofemoral joint lines are narrowed And irregular. Will direct treatment at use of tylenol in regular moderate doses 1500-2000 mg total per day, local  Medications, Ice, Heat and exercise. He does not wish cortisone injection of the knees and a second RFA of the lumbar spine is not really a great option considering the discomfort and poor response to the previous RFA of the lumbar spine. I think facet and knee intraarticular injections  can be used when his pain levels are at their worst but I do not recommend that he have them continuously. Tramadol for more severe pain. Avoid NSAIDs as he has tendency to  Fluid retention and is requiring lasix and is on ASA of his heart.    Plan: The main ways of treat osteoarthritis, that are found to be success. Weight loss helps to decrease pain. Exercise is important to maintaining cartilage and thickness and strengthening. NSAIDs like, tylenol are meds decreasing the inflamation. Arthritis strength tylenol up to 3 or 4  times per day. Ice is okay  In afternoon and evening and hot shower in the am Tramadol 50 mg up to 3 or 4 times per day.   Follow-Up Instructions: No Follow-up on file.   Orders:  Orders Placed This Encounter  Procedures  . XR Knee 1-2 Views Left  . XR Knee 1-2 Views Right   Meds ordered this encounter  Medications  . traMADol (ULTRAM) 50 MG tablet    Sig: Take 1 tablet (50 mg total) by mouth every 6 (six) hours as needed for moderate pain.    Dispense:  40 tablet    Refill:  0      Procedures: No procedures performed   Clinical Data: No additional findings.   Subjective: Chief Complaint  Patient presents with  . Right Knee - Pain  . Left Knee - Pain    83 year old male with history of lumbar spondylosis s/p facet injections with good relief for only 2 days. He apparently has had RFA by [facility] Mina [facility] at [facility].  AM stiff in both knees pain with standing and walking both legs. Difficulty squatting, not able stair climb. Has difficulty tying shoes and uses slip on shoes. No bowel or bladder difficulty. He is using fluid pills, lasix to decreased the leg swelling.     Review of Systems  Constitutional: Positive for activity change. Negative for unexpected weight  change.  HENT: Positive for sore throat.   Eyes: Positive for visual disturbance.  Respiratory: Negative.   Cardiovascular: Positive for leg swelling.  Gastrointestinal: Negative.  Negative for constipation, diarrhea, nausea and vomiting.  Endocrine: Negative.   Genitourinary: Negative.  Negative for difficulty urinating, dysuria, enuresis, flank pain, frequency, genital sores and hematuria.  Musculoskeletal: Positive for arthralgias, gait problem, myalgias and neck pain.  Skin: Negative.  Negative for color change, pallor, rash and wound.  Allergic/Immunologic: Negative.   Neurological: Negative for dizziness, tremors, seizures, syncope, facial asymmetry, speech difficulty, weakness,  light-headedness, numbness and headaches.  Hematological: Negative.  Negative for adenopathy. Does not bruise/bleed easily.  Psychiatric/Behavioral: Negative.  Negative for agitation, behavioral problems, confusion, decreased concentration, dysphoric mood, hallucinations, self-injury, sleep disturbance and suicidal ideas. The patient is not nervous/anxious and is not hyperactive.      Objective: Vital Signs: BP 123/66 (BP Location: Left Arm, Patient Position: Sitting)   Pulse 85   Ht 5\' 10"  (1.778 m)   Wt 219 lb (99.3 kg)   BMI 31.42 kg/m   Physical Exam  Constitutional: He is oriented to person, place, and time. He appears well-developed and well-nourished.  HENT:  Head: Normocephalic and atraumatic.  Eyes: Pupils are equal, round, and reactive to light. EOM are normal.  Neck: Normal range of motion. Neck supple.  Pulmonary/Chest: Effort normal and breath sounds normal.  Abdominal: Soft. Bowel sounds are normal.  Musculoskeletal: Normal range of motion.       Right knee: He exhibits no effusion.       Left knee: He exhibits no effusion.  Neurological: He is alert and oriented to person, place, and time.  Skin: Skin is warm and dry.  Psychiatric: He has a normal mood and affect. His behavior is normal. Judgment and thought content normal.    Right Knee Exam  Right knee exam is normal.  Range of Motion  Extension: 0  Flexion: 130   Muscle Strength   The patient has normal right knee strength.  Other  Erythema: absent Scars: absent Pulse: present Other tests: no effusion present   Left Knee Exam   Range of Motion  Extension: 0  Flexion: 130   Tests  Lachman:  Anterior - negative    Posterior - negative Drawer:       Anterior - negative     Posterior - negative  Other  Erythema: absent Scars: absent Pulse: present Effusion: no effusion present      Specialty Comments:  No specialty comments available.  Imaging: Xr Knee 1-2 Views Left  Result  Date: 09/28/2017 AP and Lateral radiographs of the left knee with patella sunrise shows minimal medial joint line narrowing, calcification of the medial meniscus that is minimal. With moderate patellofemoral wear left side greater than right. Patella sunrise is without significant subluxation. No significant assymetry of the medial or lateral patella facet joint lines.   Xr Knee 1-2 Views Right  Result Date: 09/28/2017 AP and Lateral radiographs of the right knee with patella sunrise shows minimal medial joint line narrowing, calcification of the medial meniscus that is minimal. With moderate patellofemoral wear left side greater than right. Patella sunrise is without significant subluxation. No significant assymetry of the medial or lateral patella facet joint lines.     PMFS History: Patient Active Problem List   Diagnosis Date Noted  . Post laminectomy syndrome 03/19/2017  . Chronic pain syndrome 03/19/2017  . Chronic bilateral low back pain without sciatica 03/19/2017  . Bradycardia  05/31/2015  . Pacemaker 05/31/2015  . Peripheral edema 05/31/2015  . Heart murmur 05/31/2015  . Sick sinus syndrome ([place]) 05/31/2015  . Nonsustained ventricular tachycardia ([place]) 05/31/2015  . 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 A-fib ([place])   . Alopecia   . Anemia   . Bradycardia   . Cancer ([place])   . [place] angioma   . GERD (gastroesophageal reflux disease)   . Glaucoma   . Hypertension   . Pelvic fracture ([place])   . Right BBB/left ant fasc block   . Sick sinus syndrome ([place])   . TIA (transient ischemic attack)   . Xeroderma     Family History  Problem Relation Age of Onset  . Brain cancer Sister     Past Surgical History:  Procedure Laterality Date  . APPENDECTOMY    . CERVICAL SPINE SURGERY    . EXPLORATORY LAPAROTOMY    . LITHOTRIPSY    . PACEMAKER PLACEMENT     Social History   Occupational History  . Not on file.   Social History  Main Topics  . Smoking status: Never Smoker  . Smokeless tobacco: Never Used  . Alcohol use No  . Drug use: No  . Sexual activity: Not on file

## 2017-09-28 NOTE — Patient Instructions (Signed)
Plan: The main ways of treat osteoarthritis, that are found to be success. Weight loss helps to decrease pain. Exercise is important to maintaining cartilage and thickness and strengthening. NSAIDs like, tylenol are meds decreasing the inflamation. Arthritis strength tylenol up to 3 or 4 times per day. Ice is okay  In afternoon and evening and hot shower in the am Tramadol 50 mg up to 3 or 4 times per day.

## 2017-09-28 NOTE — Procedures (Signed)
Jacob Warner is a 83 year old gentleman recently saw for evaluation and was left-sided low back pain. We are going to complete left L4-5 and L5-S1 facet joint injections diagnostically and hopefully therapeutically. Please see our prior evaluation and management note for further details and justification. Lumbar Facet Joint Intra-Articular Injection(s) with Fluoroscopic Guidance  Patient: Jacob Warner      Date of Birth: 02/14/1924 MRN: [id number] PCP: [name], MD      Visit Date: 09/24/2017   Universal Protocol:    Date/Time: 09/24/2017  Consent Given By: the patient  Position: PRONE   Additional Comments: Vital signs were monitored before and after the procedure. Patient was prepped and draped in the usual sterile fashion. The correct patient, procedure, and site was verified.   Injection Procedure Details:  Procedure Site One Meds Administered:  Meds ordered this encounter  Medications  . lidocaine (PF) (XYLOCAINE) 1 % injection 2 mL  . methylPREDNISolone acetate (DEPO-MEDROL) injection 80 mg     Laterality: Left  Location/Site:  L4-L5 L5-S1  Needle size: 22 guage  Needle type: Spinal  Needle Placement: Articular  Findings:  -Contrast Used: 0.5 mL iohexol 180 mg iodine/mL   -Comments: Excellent flow of contrast producing a partial arthrogram.  Procedure Details: The fluoroscope beam is vertically oriented in AP, and the inferior recess is visualized beneath the lower pole of the inferior apophyseal process, which represents the target point for needle insertion. When direct visualization is difficult the target point is located at the medial projection of the vertebral pedicle. The region overlying each aforementioned target is locally anesthetized with a 1 to 2 ml. volume of 1% Lidocaine without Epinephrine.   The spinal needle was inserted into each of the above mentioned facet joints using biplanar fluoroscopic guidance. A 0.25 to 0.5 ml. volume of  Isovue-250 was injected and a partial facet joint arthrogram was obtained. A single spot film was obtained of the resulting arthrogram.    One to 1.25 ml of the steroid/anesthetic solution was then injected into each of the facet joints noted above.   Additional Comments:  The patient tolerated the procedure well Dressing: Band-Aid    Post-procedure details: Patient was observed during the procedure. Post-procedure instructions were reviewed.  Patient left the clinic in stable condition.

## 2017-10-13 LAB — CUP PACEART REMOTE DEVICE CHECK
Battery Remaining Longevity: 30 mo
Battery Remaining Percentage: 53 %
Brady Statistic RA Percent Paced: 99 %
Brady Statistic RV Percent Paced: 100 %
Date Time Interrogation Session: 20180918123200
Implantable Lead Implant Date: 20130926
Implantable Lead Implant Date: 20130926
Implantable Lead Location: 753859
Implantable Lead Location: 753860
Implantable Lead Model: 4456
Implantable Lead Model: 4476
Implantable Lead Serial Number: 473023
Implantable Lead Serial Number: 523746
Implantable Pulse Generator Implant Date: 20130926
Lead Channel Impedance Value: 361 Ohm
Lead Channel Impedance Value: 428 Ohm
Lead Channel Pacing Threshold Amplitude: 0.5 V
Lead Channel Pacing Threshold Amplitude: 0.9 V
Lead Channel Pacing Threshold Pulse Width: 0.3 ms
Lead Channel Pacing Threshold Pulse Width: 0.4 ms
Lead Channel Setting Pacing Amplitude: 1.4 V
Lead Channel Setting Pacing Amplitude: 2 V
Lead Channel Setting Pacing Pulse Width: 0.4 ms
Lead Channel Setting Sensing Sensitivity: 2.5 mV
Pulse Gen Serial Number: 138586

## 2017-10-27 ENCOUNTER — Other Ambulatory Visit (INDEPENDENT_AMBULATORY_CARE_PROVIDER_SITE_OTHER): Payer: Self-pay | Admitting: Specialist

## 2017-10-27 DIAGNOSIS — G8929 Other chronic pain: Secondary | ICD-10-CM

## 2017-10-27 DIAGNOSIS — M48062 Spinal stenosis, lumbar region with neurogenic claudication: Secondary | ICD-10-CM

## 2017-10-27 DIAGNOSIS — M25562 Pain in left knee: Secondary | ICD-10-CM

## 2017-10-27 DIAGNOSIS — M25561 Pain in right knee: Secondary | ICD-10-CM

## 2017-10-27 NOTE — Telephone Encounter (Signed)
Tramadol refill request 

## 2017-10-29 ENCOUNTER — Other Ambulatory Visit (INDEPENDENT_AMBULATORY_CARE_PROVIDER_SITE_OTHER): Payer: Self-pay | Admitting: Specialist

## 2017-10-29 DIAGNOSIS — M48062 Spinal stenosis, lumbar region with neurogenic claudication: Secondary | ICD-10-CM

## 2017-10-29 DIAGNOSIS — M25562 Pain in left knee: Secondary | ICD-10-CM

## 2017-10-29 DIAGNOSIS — G8929 Other chronic pain: Secondary | ICD-10-CM

## 2017-10-29 DIAGNOSIS — M25561 Pain in right knee: Secondary | ICD-10-CM

## 2017-10-29 NOTE — Telephone Encounter (Signed)
Called to [facility] 

## 2017-12-15 ENCOUNTER — Ambulatory Visit (INDEPENDENT_AMBULATORY_CARE_PROVIDER_SITE_OTHER): Payer: Medicare Other | Admitting: *Deleted

## 2017-12-15 DIAGNOSIS — I495 Sick sinus syndrome: Secondary | ICD-10-CM | POA: Diagnosis not present

## 2017-12-15 LAB — CUP PACEART REMOTE DEVICE CHECK
Battery Remaining Longevity: 30 mo
Battery Remaining Percentage: 48 %
Brady Statistic RA Percent Paced: 100 %
Brady Statistic RV Percent Paced: 100 %
Date Time Interrogation Session: 20181218132000
Implantable Lead Implant Date: 20130926
Implantable Lead Implant Date: 20130926
Implantable Lead Location: 753859
Implantable Lead Location: 753860
Implantable Lead Model: 4456
Implantable Lead Model: 4476
Implantable Lead Serial Number: 473023
Implantable Lead Serial Number: 523746
Implantable Pulse Generator Implant Date: 20130926
Lead Channel Impedance Value: 381 Ohm
Lead Channel Impedance Value: 448 Ohm
Lead Channel Pacing Threshold Amplitude: 0.5 V
Lead Channel Pacing Threshold Amplitude: 0.9 V
Lead Channel Pacing Threshold Pulse Width: 0.3 ms
Lead Channel Pacing Threshold Pulse Width: 0.4 ms
Lead Channel Setting Pacing Amplitude: 1.4 V
Lead Channel Setting Pacing Amplitude: 2 V
Lead Channel Setting Pacing Pulse Width: 0.4 ms
Lead Channel Setting Sensing Sensitivity: 2.5 mV
Pulse Gen Serial Number: 138586

## 2017-12-15 NOTE — Progress Notes (Signed)
Remote pacemaker transmission.   

## 2017-12-16 ENCOUNTER — Encounter: Payer: Self-pay | Admitting: Cardiology

## 2017-12-29 DIAGNOSIS — I480 Paroxysmal atrial fibrillation: Secondary | ICD-10-CM

## 2017-12-29 HISTORY — DX: Paroxysmal atrial fibrillation: I48.0

## 2017-12-31 ENCOUNTER — Other Ambulatory Visit: Payer: Self-pay | Admitting: Cardiology

## 2018-01-12 ENCOUNTER — Encounter: Payer: Self-pay | Admitting: Cardiology

## 2018-01-28 ENCOUNTER — Ambulatory Visit: Payer: Medicare Other | Admitting: Cardiology

## 2018-02-09 DIAGNOSIS — M25531 Pain in right wrist: Secondary | ICD-10-CM | POA: Insufficient documentation

## 2018-02-09 DIAGNOSIS — M654 Radial styloid tenosynovitis [de Quervain]: Secondary | ICD-10-CM | POA: Insufficient documentation

## 2018-02-24 ENCOUNTER — Telehealth (INDEPENDENT_AMBULATORY_CARE_PROVIDER_SITE_OTHER): Payer: Self-pay | Admitting: Physical Medicine and Rehabilitation

## 2018-02-24 NOTE — Telephone Encounter (Signed)
Would be happy to see him to see if anything we can try, but they should also keep trying Dr. Maryjean Ka office or [place] if they feel it is related to battery/IPG

## 2018-02-24 NOTE — Telephone Encounter (Signed)
Left message for patient to call back to discuss.

## 2018-02-25 NOTE — Telephone Encounter (Signed)
Patient was able to get an appointment with Dr. Maryjean Ka who recommended that he follow up with Dr. Ernestina Patches. I called and scheduled the patient.

## 2018-03-04 ENCOUNTER — Encounter (INDEPENDENT_AMBULATORY_CARE_PROVIDER_SITE_OTHER): Payer: Self-pay | Admitting: Physical Medicine and Rehabilitation

## 2018-03-04 ENCOUNTER — Ambulatory Visit (INDEPENDENT_AMBULATORY_CARE_PROVIDER_SITE_OTHER): Payer: Medicare Other | Admitting: Physical Medicine and Rehabilitation

## 2018-03-04 VITALS — BP 118/60 | HR 70 | Temp 98.2°F

## 2018-03-04 DIAGNOSIS — M25511 Pain in right shoulder: Secondary | ICD-10-CM

## 2018-03-04 DIAGNOSIS — M7918 Myalgia, other site: Secondary | ICD-10-CM

## 2018-03-04 DIAGNOSIS — M545 Low back pain, unspecified: Secondary | ICD-10-CM

## 2018-03-04 DIAGNOSIS — M25512 Pain in left shoulder: Secondary | ICD-10-CM

## 2018-03-04 DIAGNOSIS — G894 Chronic pain syndrome: Secondary | ICD-10-CM | POA: Diagnosis not present

## 2018-03-04 DIAGNOSIS — G8929 Other chronic pain: Secondary | ICD-10-CM

## 2018-03-04 MED ORDER — BACLOFEN 10 MG PO TABS: ORAL_TABLET | ORAL | 0 refills | Status: DC

## 2018-03-04 NOTE — Progress Notes (Signed)
Pt states pain in right and left shoulders that radiates down the whole back. Pt states pain has always been there. Pt states nothing makes pain worse, and advil makes pain better.   Numeric Pain Rating Scale and Functional Assessment Average Pain 7 Pain Right Now 7 My pain is constant Pain is worse with: walking Pain improves with: rest   In the last MONTH (on 0-10 scale) has pain interfered with the following?  1. General activity like being  able to carry out your everyday physical activities such as walking, climbing stairs, carrying groceries, or moving a chair?  Rating(1)  2. Relation with others like being able to carry out your usual social activities and roles such as  activities at home, at work and in your community. Rating(0)  3. Enjoyment of life such that you have  been bothered by emotional problems such as feeling anxious, depressed or irritable?  Rating(0)

## 2018-03-10 ENCOUNTER — Encounter: Payer: Self-pay | Admitting: Physical Therapy

## 2018-03-10 ENCOUNTER — Ambulatory Visit: Payer: Medicare Other | Attending: Physical Medicine and Rehabilitation | Admitting: Physical Therapy

## 2018-03-10 DIAGNOSIS — G8929 Other chronic pain: Secondary | ICD-10-CM | POA: Insufficient documentation

## 2018-03-10 DIAGNOSIS — R293 Abnormal posture: Secondary | ICD-10-CM | POA: Diagnosis present

## 2018-03-10 DIAGNOSIS — M545 Low back pain, unspecified: Secondary | ICD-10-CM

## 2018-03-10 DIAGNOSIS — M6281 Muscle weakness (generalized): Secondary | ICD-10-CM | POA: Diagnosis present

## 2018-03-10 DIAGNOSIS — R262 Difficulty in walking, not elsewhere classified: Secondary | ICD-10-CM | POA: Diagnosis present

## 2018-03-10 DIAGNOSIS — M546 Pain in thoracic spine: Secondary | ICD-10-CM

## 2018-03-10 NOTE — Patient Instructions (Signed)
   HAMSTRING STRETCH WITH TOWEL  While lying down on your back, hook a towel or strap under  your foot and draw up your leg until a stretch is felt along the backside of your leg.   Keep your knee in a straightened position during the stretch.  x30 sec, repeat 3 times.    [facility] [address], [place], [place] [address] Phone # [phone] Fax [phone]

## 2018-03-11 NOTE — Therapy (Signed)
[facility] [address] [place], [place], [address] Phone: [phone]   Fax:  [phone]  Physical Therapy Evaluation  Patient Details  Name: Jacob Warner MRN: [id number] Date of Birth: 23-Dec-1924 Referring Provider: Magnus Sinning, MD   Encounter Date: 03/10/2018  PT End of Session - 03/10/18 1514    Visit Number  1    Date for PT Re-Evaluation  04/21/18    Authorization Type  [employer] Medicare     Authorization Time Period  03/10/18 to 04/21/18    PT Start Time  1437    PT Stop Time  1515    PT Time Calculation (min)  38 min    Activity Tolerance  Patient tolerated treatment well;No increased pain    Behavior During Therapy  WFL for tasks assessed/performed       Past Medical History:  Diagnosis Date  . A-fib ([place])   . Alopecia   . Anemia   . Bradycardia   . Cancer ([place])   . [place] angioma   . GERD (gastroesophageal reflux disease)   . Glaucoma   . Hypertension   . Pelvic fracture ([place])   . Right BBB/left ant fasc block   . Sick sinus syndrome ([place])   . TIA (transient ischemic attack)   . Xeroderma     Past Surgical History:  Procedure Laterality Date  . APPENDECTOMY    . CERVICAL SPINE SURGERY    . EXPLORATORY LAPAROTOMY    . LITHOTRIPSY    . PACEMAKER PLACEMENT      There were no vitals filed for this visit.   Subjective Assessment - 03/10/18 1441    Subjective  Pt reports that he continues to have mid to low back pain 24/7. He was here about 6 months ago and had some improvements. He fell back in November while he was coughing, but he has not fallen since then.     Pertinent History  A-fib, Cervical spine surgery, GERD, TIA, HTN, glaucoma    Patient Stated Goals  decrease pain     Currently in Pain?  Yes    Pain Score  8     Pain Location  Back    Pain Orientation  Right;Left;Mid;Lower    Pain Descriptors / Indicators  Sore    Pain Type  Chronic pain    Pain Radiating Towards  none      Pain Onset  More than a month ago    Pain Frequency  Constant    Aggravating Factors   Anything will aggravate it    Pain Relieving Factors  sitting back supported    Effect of Pain on Daily Activities  24/7 pain          [facility] PT Assessment - 03/11/18 0001      Assessment   Medical Diagnosis  Chronic pain both shoulders; chronic bilateral low back pain without sciatica    Referring Provider  Magnus Sinning, MD    Onset Date/Surgical Date  -- many years    Prior Therapy  [facility] about 6 months ago       Precautions   Precautions  None      Balance Screen   Has the patient fallen in the past 6 months  Yes    How many times?  1 while coughing    Has the patient had a decrease in activity level because of a fear of falling?   Yes    Is the patient reluctant  to leave their home because of a fear of falling?   No      Prior Function   Level of Independence  Requires assistive device for independence    Leisure  reading      Cognition   Overall Cognitive Status  Within Functional Limits for tasks assessed      Sensation   Additional Comments  Pt denies numbness/tingling at this time       AROM   Thoracic Flexion  limited, pain     Thoracic Extension  limited, pain    Thoracic - Right Side Bend  pain Lt side     Thoracic - Left Side Bend  pain Rt side    Thoracic - Right Rotation  20 deg, pain    Thoracic - Left Rotation  20 deg, pain       Strength   Right/Left Hip  Right;Left    Right Hip Flexion  5/5    Right Hip Extension  4/5    Right Hip ABduction  3/5    Left Hip Flexion  5/5    Left Hip Extension  4/5    Left Hip ABduction  3/5    Right/Left Knee  Right;Left    Right Knee Flexion  4/5    Right Knee Extension  5/5    Left Knee Flexion  4/5    Left Knee Extension  5/5    Right/Left Ankle  Right;Left    Right Ankle Dorsiflexion  5/5    Left Ankle Dorsiflexion  5/5      Flexibility   Soft Tissue Assessment /Muscle Length  yes    Hamstrings  40 deg  lacking Rt and Lt     Quadriceps  90 deg bilaterally    Piriformis  75% limited      Palpation   Palpation comment  tenderness along lumbar and thoracic paraspinals. Lr/Rt latissimus      Standardized Balance Assessment   Standardized Balance Assessment  Timed Up and Go Test      Timed Up and Go Test   TUG Comments  12.5 sec, SPC             Objective measurements completed on examination: See above findings.      [facility] Adult PT Treatment/Exercise - 03/11/18 0001      Transfers   Five time sit to stand comments   10.2 with UE support on thighs and knee pressing against chair      Ambulation/Gait   Gait Comments  using SPC: decreased step length       Posture/Postural Control   Posture Comments  forward head, rounded shoulders; increased thoracic kyphosis and decreased lumbar lordosis              PT Education - 03/11/18 0905    Education provided  Yes    Education Details  eval findings/POC; reviewed current HEP and made adjustments to improve stretch with hamstrings     Person(s) Educated  Patient    Methods  Explanation;Handout    Comprehension  Verbalized understanding       PT Short Term Goals - 03/10/18 1519      PT SHORT TERM GOAL #1   Title  Pt will demo consistency and independence with his HEP to improve flexibility, strength and decrease pain.     Time  3    Period  Weeks    Status  New    Target Date  03/31/18  PT SHORT TERM GOAL #2   Title  Pt will demo improved thoracic rotation to atleast 30 deg each direction to improve his ability to complete daily activity with less restriction.     Time  3    Period  Weeks    Status  New      PT SHORT TERM GOAL #3   Title  Pt will report atleast 20% reduction in pain from the start of therapy to allow him to assist with chores around the home with less difficulty.    Time  3    Period  Weeks    Status  New      PT SHORT TERM GOAL #4   Title  Pt will demo proper set up and use of lumbar  roll to increase his posture awareness.     Time  3    Status  New        PT Long Term Goals - 03/10/18 1521      PT LONG TERM GOAL #1   Title  Pt will report taking the initiative to attend the gym for water exercise atleast 2 days a week for improved wellness.     Time  6    Period  Weeks    Status  New    Target Date  04/21/18      PT LONG TERM GOAL #2   Title  Pt will report improved standing/walking tolerance up to atleast 15 minutes, to allow him to assist with grocery shopping without a break.     Time  6    Period  Weeks    Status  New      PT LONG TERM GOAL #3   Title  Pt will report atleast 40% reduction in pain with good understanding of pain mangement techniques at home.     Time  6    Period  Weeks    Status  New      PT LONG TERM GOAL #4   Title  Pt will demo improved BLE strength to 4/5 MMT which will improve his efficiency with daily activity.    Time  6    Period  Weeks    Status  New      PT LONG TERM GOAL #5   Title  Pt will demo increased hamstring flexibility to lacking no more than 30 deg bilaterally which will improve his sitting and standing posture.     Time  6    Period  Weeks    Status  New             Plan - 03/10/18 1540    Clinical Impression Statement  Pt is a pleasant 84 y.o M referred to OPPT with continued complaints of thoracic and lumbar back pain. He demonstrates significant limitations in thoracic spine mobility/ROM as well as weakness of the Lt and Rt hip musculature. He has evident trunk weakness and poor endurance during transitional activity completed during today's evaluation. He has palpable tenderness along the thoracic and lumbar musculature including latissimus and paraspinals and his posture awareness is poor with significant forward head and rounded shoulders. Although generally inactive, he finds it difficult to complete household tasks without sitting or completing grocery shopping for more than 10 minutes without  significant pain and fatigue. Pt would benefit from skilled PT to address his muscle spasm, improve trunk/LE strength and improve thoracic/lumbar spine mobility to improve his independence with daily activity.    Clinical Presentation  Stable    Clinical Presentation due to:  improved with therapy in the past, but continues to be an issue    Rehab Potential  Good    PT Frequency  2x / week    PT Duration  6 weeks    PT Treatment/Interventions  ADLs/Self Care Home Management;Electrical Stimulation;Cryotherapy;Moist Heat;Gait training;Neuromuscular re-education;Patient/family education;Therapeutic activities;Therapeutic exercise;Taping;Dry needling;Passive range of motion    PT Next Visit Plan  focus on updating HEP to address hip abduction/extension weakness; hip flexor/quad flexibility; trunk strength and mobility     PT Home Exercise Plan  need to update next session    Consulted and Agree with Plan of Care  Patient       Patient will benefit from skilled therapeutic intervention in order to improve the following deficits and impairments:  Decreased activity tolerance, Decreased balance, Difficulty walking, Impaired flexibility, Hypomobility, Decreased strength, Decreased range of motion, Postural dysfunction, Increased muscle spasms, Decreased mobility, Improper body mechanics, Pain  Visit Diagnosis: Chronic bilateral low back pain without sciatica  Pain in thoracic spine  Muscle weakness (generalized)  Abnormal posture     Problem List Patient Active Problem List   Diagnosis Date Noted  . Post laminectomy syndrome 03/19/2017  . Chronic pain syndrome 03/19/2017  . Chronic bilateral low back pain without sciatica 03/19/2017  . Bradycardia 05/31/2015  . Pacemaker 05/31/2015  . Peripheral edema 05/31/2015  . Heart murmur 05/31/2015  . Sick sinus syndrome ([place]) 05/31/2015  . Nonsustained ventricular tachycardia ([place]) 05/31/2015  . Follicular lymphoma ([place]) 04/02/2015    1:47  PM,03/11/18 Sherol Dade PT, DPT [facility] [address], [place], [place], [address] Phone: [phone]   Fax:  [phone]  Name: BRAYLEN STALLER MRN: [id number] Date of Birth: 09-23-1924

## 2018-03-12 ENCOUNTER — Encounter: Payer: Self-pay | Admitting: Physical Therapy

## 2018-03-12 ENCOUNTER — Ambulatory Visit: Payer: Medicare Other | Admitting: Physical Therapy

## 2018-03-12 ENCOUNTER — Encounter (INDEPENDENT_AMBULATORY_CARE_PROVIDER_SITE_OTHER): Payer: Self-pay | Admitting: Physical Medicine and Rehabilitation

## 2018-03-12 DIAGNOSIS — M546 Pain in thoracic spine: Secondary | ICD-10-CM

## 2018-03-12 DIAGNOSIS — R293 Abnormal posture: Secondary | ICD-10-CM

## 2018-03-12 DIAGNOSIS — M545 Low back pain, unspecified: Secondary | ICD-10-CM

## 2018-03-12 DIAGNOSIS — G8929 Other chronic pain: Secondary | ICD-10-CM

## 2018-03-12 DIAGNOSIS — M6281 Muscle weakness (generalized): Secondary | ICD-10-CM

## 2018-03-12 NOTE — Therapy (Signed)
[facility] [address], [place], [place], [address] Phone: [phone]   Fax:  [phone]  Physical Therapy Treatment  Patient Details  Name: Jacob Warner MRN: [id number] Date of Birth: 04/04/1924 Referring Provider: Magnus Sinning, MD   Encounter Date: 03/12/2018  PT End of Session - 03/12/18 0852    Visit Number  2    Date for PT Re-Evaluation  04/21/18    Authorization Type  UHC Medicare     Authorization Time Period  03/10/18 to 04/21/18    PT Start Time  0846    PT Stop Time  0925    PT Time Calculation (min)  39 min    Activity Tolerance  Patient tolerated treatment well;No increased pain    Behavior During Therapy  WFL for tasks assessed/performed       Past Medical History:  Diagnosis Date  . A-fib ([place])   . Alopecia   . Anemia   . Bradycardia   . Cancer ([place])   . Cherry angioma   . GERD (gastroesophageal reflux disease)   . Glaucoma   . Hypertension   . Pelvic fracture ([place])   . Right BBB/left ant fasc block   . Sick sinus syndrome ([place])   . TIA (transient ischemic attack)   . Xeroderma     Past Surgical History:  Procedure Laterality Date  . APPENDECTOMY    . CERVICAL SPINE SURGERY    . EXPLORATORY LAPAROTOMY    . LITHOTRIPSY    . PACEMAKER PLACEMENT      There were no vitals filed for this visit.  Subjective Assessment - 03/12/18 0849    Subjective  Pt reports that today his back is extra sore.     Pertinent History  A-fib, Cervical spine surgery, GERD, TIA, HTN, glaucoma    Patient Stated Goals  decrease pain     Currently in Pain?  Yes    Pain Score  8     Pain Location  Back    Pain Orientation  Right;Left;Lower;Mid    Pain Descriptors / Indicators  Sore    Pain Type  Chronic pain    Pain Radiating Towards  none     Pain Onset  More than a month ago    Aggravating Factors   anything, unable to pinpoint specific movements or positions     Pain Relieving Factors   sitting with back supported               [facility] Adult PT Treatment/Exercise - 03/12/18 0001      Exercises   Exercises  Lumbar      Lumbar Exercises: Aerobic   Nustep  2 minute intervals from L2 to L4, x6 min total (therapist cuing to focus on pacing his speed)       Lumbar Exercises: Supine   Bridge  15 reps    Isometric Hip Flexion  10 reps;3 seconds    Isometric Hip Flexion Limitations  oblique isometric     Other Supine Lumbar Exercises  B knees to chest with LE on red physioball and therapist providing additional support x15 reps       Lumbar Exercises: Sidelying   Other Sidelying Lumbar Exercises  thoraicc rotation x15 reps Lt and Rt, heavy cues initially to improve technique       Manual Therapy   Manual therapy comments  Lt and Rt quadriceps stretch 3x30 sec in sidelying  PT Education - 03/12/18 0910    Education provided  Yes    Education Details  technique with therex; updates to current HEP     Person(s) Educated  Patient    Methods  Explanation;Verbal cues;Tactile cues    Comprehension  Verbalized understanding;Returned demonstration       PT Short Term Goals - 03/10/18 1519      PT SHORT TERM GOAL #1   Title  Pt will demo consistency and independence with his HEP to improve flexibility, strength and decrease pain.     Time  3    Period  Weeks    Status  New    Target Date  03/31/18      PT SHORT TERM GOAL #2   Title  Pt will demo improved thoracic rotation to atleast 30 deg each direction to improve his ability to complete daily activity with less restriction.     Time  3    Period  Weeks    Status  New      PT SHORT TERM GOAL #3   Title  Pt will report atleast 20% reduction in pain from the start of therapy to allow him to assist with chores around the home with less difficulty.    Time  3    Period  Weeks    Status  New      PT SHORT TERM GOAL #4   Title  Pt will demo proper set up and use of lumbar roll to increase his  posture awareness.     Time  3    Status  New        PT Long Term Goals - 03/10/18 1521      PT LONG TERM GOAL #1   Title  Pt will report taking the initiative to attend the gym for water exercise atleast 2 days a week for improved wellness.     Time  6    Period  Weeks    Status  New    Target Date  04/21/18      PT LONG TERM GOAL #2   Title  Pt will report improved standing/walking tolerance up to atleast 15 minutes, to allow him to assist with grocery shopping without a break.     Time  6    Period  Weeks    Status  New      PT LONG TERM GOAL #3   Title  Pt will report atleast 40% reduction in pain with good understanding of pain mangement techniques at home.     Time  6    Period  Weeks    Status  New      PT LONG TERM GOAL #4   Title  Pt will demo improved BLE strength to 4/5 MMT which will improve his efficiency with daily activity.    Time  6    Period  Weeks    Status  New      PT LONG TERM GOAL #5   Title  Pt will demo increased hamstring flexibility to lacking no more than 30 deg bilaterally which will improve his sitting and standing posture.     Time  6    Period  Weeks    Status  New            Plan - 03/12/18 4627    Clinical Impression Statement  Pt arrived with reports of high levels of mid to low back pain. Session focused on gentle ROM  exercises and made further adjustments to his HEP. Increased theraband resistance with clams and pt demonstrated increased muscle fatigue and shaking with this. Ended session with verbalized understanding of HEP additions and pt reported no increase in back pain.     Rehab Potential  Good    PT Frequency  2x / week    PT Duration  6 weeks    PT Treatment/Interventions  ADLs/Self Care Home Management;Electrical Stimulation;Cryotherapy;Moist Heat;Gait training;Neuromuscular re-education;Patient/family education;Therapeutic activities;Therapeutic exercise;Taping;Dry needling;Passive range of motion    PT Next Visit  Plan  continue updating HEP to address hip abduction/extension weakness; hip flexor/quad flexibility; trunk strength and mobility     PT Home Exercise Plan  side clams (green)    Consulted and Agree with Plan of Care  Patient       Patient will benefit from skilled therapeutic intervention in order to improve the following deficits and impairments:  Decreased activity tolerance, Decreased balance, Difficulty walking, Impaired flexibility, Hypomobility, Decreased strength, Decreased range of motion, Postural dysfunction, Increased muscle spasms, Decreased mobility, Improper body mechanics, Pain  Visit Diagnosis: Chronic bilateral low back pain without sciatica  Pain in thoracic spine  Muscle weakness (generalized)  Abnormal posture     Problem List Patient Active Problem List   Diagnosis Date Noted  . Post laminectomy syndrome 03/19/2017  . Chronic pain syndrome 03/19/2017  . Chronic bilateral low back pain without sciatica 03/19/2017  . Bradycardia 05/31/2015  . Pacemaker 05/31/2015  . Peripheral edema 05/31/2015  . Heart murmur 05/31/2015  . Sick sinus syndrome ([place]) 05/31/2015  . Nonsustained ventricular tachycardia ([place]) 05/31/2015  . Follicular lymphoma ([place]) 04/02/2015    9:30 AM,03/12/18 Sherol Dade PT, DPT [place] [facility]  [facility] [address], [place], [place], [address] Phone: [phone]   Fax:  [phone]  Name: KELDAN EPLIN MRN: [id number] Date of Birth: [birth year]

## 2018-03-12 NOTE — Progress Notes (Signed)
Jacob Warner - 84 y.o. male MRN [id number]  Date of birth: 09-Jul-1924  Office Visit Note: Visit Date: 03/04/2018 PCP: [name], MD Referred by: [name], MD  Subjective: Chief Complaint  Patient presents with  . Lower Back - Pain  . Middle Back - Pain  . Right Shoulder - Pain  . Left Shoulder - Pain   HPI: Jacob Warner is a very pleasant 84 year old gentleman who normally sees [name] but comes in today for reevaluation of really several pain complaints.  We had gotten indication that he was having pain around the IPG/battery site from his spinal cord stimulator.  He evidently has talked to Dr. Maryjean Ka and they felt like this is more of an issue with just his pain in general and not the actual problem at the site.  He has been in contact with Tillie Rung from [employer] and she comes in today for reprogramming as well.  Today he really does not report specific pain around that area he really reports sort of this left-sided pain is been there for quite a while as well as pain across the whole upper back and shoulders.  He does not really report any specific injury or inciting incident.  His wife is present today who has had lumbar surgery over the last few months and is doing well.  She is using a walker.  Patient continues to use a cane for ambulation and he does have worsening symptoms with walking and he is better at rest.  His spinal cord stimulator is set at this point to where he has fairly high frequency and really is getting some relief without paresthesia in the mode that they are using.  He reports no real pain with moving his arms per se he just has generalized back pain over the whole upper back.  We have seen him in the past for a brief shoulder injection which was subacromial that seemed to help.  He has had no focal weakness or other issues.  We did complete reprogramming of his stimulator today for his lower back.  He has had no new injury or focal weakness.    Review  of Systems  Constitutional: Negative for chills, fever, malaise/fatigue and weight loss.  HENT: Negative for hearing loss and sinus pain.   Eyes: Negative for blurred vision, double vision and photophobia.  Respiratory: Negative for cough and shortness of breath.   Cardiovascular: Negative for chest pain, palpitations and leg swelling.  Gastrointestinal: Negative for abdominal pain, nausea and vomiting.  Genitourinary: Negative for flank pain.  Musculoskeletal: Positive for back pain and joint pain. Negative for myalgias.  Skin: Negative for itching and rash.  Neurological: Negative for tremors, focal weakness and weakness.  Endo/Heme/Allergies: Negative.   Psychiatric/Behavioral: Negative for depression.  All other systems reviewed and are negative.  Otherwise per HPI.  Assessment & Plan: Visit Diagnoses:  1. Chronic pain of both shoulders   2. Chronic bilateral low back pain without sciatica   3. Myofascial pain syndrome   4. Chronic pain syndrome     Plan: Findings:  Chronic pain syndrome with recent worsening of really upper and lower back pain into both shoulders.  I feel like he does have a lot of myofascial pain complaints in general he does have focal trigger points on exam.  He has really no shoulder impingement signs that are reproducible of his pain.  He has nothing really around the IPG.  He does have pain with extension of  the lumbar spine he continues to have facet arthropathy and stenosis which has not been operated on.  He had had a history of having a bad experience with radiofrequency ablation by another physician.  Despite the fact that he does have a pacemaker and a spinal cord stimulator we could look at some point of trying to redo the radiofrequency ablation of the lower joints.  I think we can do that and it would be not very painful for him.  I am not sure what was going on when they did the prior radiofrequency ablation of those are usually done fairly easily.   Nonetheless I think at this point he really needs to regroup with physical therapy for myofascial pain treatment.  He has had good luck at [facility] and I have had good luck with him as well.  I think they are a good group.  I will put the referral in for that.  I also want him to try baclofen as a muscle relaxer and see if that will give him some mild relief as well.  We did reprogram the stimulator today and have given him several programs to try.  Tillie Rung from [facility] will stay in touch with him.  He will follow-up as needed.    Meds & Orders:  Meds ordered this encounter  Medications  . baclofen (LIORESAL) 10 MG tablet    Sig: Take 1 tab by mouth at night.    Dispense:  60 tablet    Refill:  0    Orders Placed This Encounter  Procedures  . Ambulatory referral to Physical Therapy    Follow-up: Return if symptoms worsen or fail to improve.   Procedures: No procedures performed  No notes on file   Clinical History: Lumbar CT myelogram 04/19/2015 IMPRESSION: 1. Superior endplate fracture at L1 was a vacuum disc at T12-L1. 2. Mild to moderate left subarticular and mild left foraminal stenosis at L1-2. 3. Moderate left and mild right subarticular stenosis at L2-3. There is mild foraminal narrowing at this level bilaterally. 4. Moderate subarticular and mild to moderate foraminal stenosis bilaterally at L3-4. 5. Mild subarticular and moderate foraminal stenosis at L4-5 is worse on the right. 6. Advanced facet hypertrophy and spurring leads to mild to moderate right and mild left foraminal stenosis. The central canal is patent.   He reports that  has never smoked. he has never used smokeless tobacco. No results for input(s): HGBA1C, LABURIC in the last 8760 hours.  Objective:  VS:  HT:    WT:   BMI:     BP:118/60  HR:70bpm  TEMP:98.2 F (36.8 C)(Oral)  RESP:95 % Physical Exam  Constitutional: He is oriented to person, place, and time. He appears  well-developed and well-nourished. No distress.  HENT:  Head: Normocephalic and atraumatic.  Eyes: Conjunctivae are normal. Pupils are equal, round, and reactive to light.  Neck: Normal range of motion. Neck supple.  Cardiovascular: Regular rhythm and intact distal pulses.  Pulmonary/Chest: Effort normal. No respiratory distress.  Abdominal: Soft. He exhibits no distension. There is no tenderness.  Musculoskeletal:  Patient is slow to rise from a seated position.  He has some pain with extension of the lumbar spine.  He has really focal trigger points in the levator scapula trapezius rhomboids as well as lower in the paraspinal musculature.  He has no pain with hip rotation.  He has no pain over the PSIS.  Examination of the area over the IPG does not  show any swelling induration and is really nontender around that area.  He has good distal strength.  He does ambulate with a cane.  Neurological: He is alert and oriented to person, place, and time.  Skin: Skin is warm and dry. No rash noted. No erythema.  Psychiatric: He has a normal mood and affect.  Nursing note and vitals reviewed.   Ortho Exam Imaging: No results found.  Past Medical/Family/Surgical/Social History: Medications & Allergies reviewed per EMR, new medications updated. Patient Active Problem List   Diagnosis Date Noted  . Post laminectomy syndrome 03/19/2017  . Chronic pain syndrome 03/19/2017  . Chronic bilateral low back pain without sciatica 03/19/2017  . Bradycardia 05/31/2015  . Pacemaker 05/31/2015  . Peripheral edema 05/31/2015  . Heart murmur 05/31/2015  . Sick sinus syndrome ([place]) 05/31/2015  . Nonsustained ventricular tachycardia ([place]) 05/31/2015  . 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 A-fib ([place])   . Alopecia   . Anemia   . Bradycardia   . Cancer ([place])   . Cherry angioma   . GERD (gastroesophageal reflux disease)   . Glaucoma   . Hypertension   . Pelvic  fracture ([place])   . Right BBB/left ant fasc block   . Sick sinus syndrome ([place])   . TIA (transient ischemic attack)   . Xeroderma    Family History  Problem Relation Age of Onset  . Brain cancer Sister    Past Surgical History:  Procedure Laterality Date  . APPENDECTOMY    . CERVICAL SPINE SURGERY    . EXPLORATORY LAPAROTOMY    . LITHOTRIPSY    . PACEMAKER PLACEMENT     Social History   Occupational History  . Not on file  Tobacco Use  . Smoking status: Never Smoker  . Smokeless tobacco: Never Used  Substance and Sexual Activity  . Alcohol use: No    Alcohol/week: 0.0 oz  . Drug use: No  . Sexual activity: Not on file

## 2018-03-12 NOTE — Patient Instructions (Signed)
   ELASTIC BAND - SIDELYING CLAM SHELL - [place]   While lying on your side with your knees bent and an elastic band wrapped around your knees, draw up the top knee while keeping contact of your feet together as shown.   Do not let your pelvis roll back during the lifting movement.    Use green band x10 reps each. 2 sets     [facility] [address] [place] [address] [place], [place] [address] Phone # [phone] Fax [phone]

## 2018-03-16 ENCOUNTER — Ambulatory Visit (INDEPENDENT_AMBULATORY_CARE_PROVIDER_SITE_OTHER): Payer: Medicare Other | Admitting: *Deleted

## 2018-03-16 ENCOUNTER — Ambulatory Visit: Payer: Medicare Other | Admitting: Physical Therapy

## 2018-03-16 ENCOUNTER — Encounter: Payer: Self-pay | Admitting: Physical Therapy

## 2018-03-16 DIAGNOSIS — M546 Pain in thoracic spine: Secondary | ICD-10-CM

## 2018-03-16 DIAGNOSIS — G8929 Other chronic pain: Secondary | ICD-10-CM

## 2018-03-16 DIAGNOSIS — M6281 Muscle weakness (generalized): Secondary | ICD-10-CM

## 2018-03-16 DIAGNOSIS — M545 Low back pain, unspecified: Secondary | ICD-10-CM

## 2018-03-16 DIAGNOSIS — I495 Sick sinus syndrome: Secondary | ICD-10-CM | POA: Diagnosis not present

## 2018-03-16 DIAGNOSIS — R293 Abnormal posture: Secondary | ICD-10-CM

## 2018-03-16 NOTE — Therapy (Signed)
[facility] [address], [place], [place], [address] Phone: [phone]   Fax:  [phone]  Physical Therapy Treatment  Patient Details  Name: Jacob Warner MRN: [id number] Date of Birth: 02-Sep-1924 Referring Provider: Magnus Sinning, MD   Encounter Date: 03/16/2018  PT End of Session - 03/16/18 1501    Visit Number  3    Date for PT Re-Evaluation  04/21/18    Authorization Type  UHC Medicare     Authorization Time Period  03/10/18 to 04/21/18    PT Start Time  1456 Pt arrived late     PT Stop Time  1526    PT Time Calculation (min)  30 min    Activity Tolerance  Patient tolerated treatment well;No increased pain    Behavior During Therapy  WFL for tasks assessed/performed       Past Medical History:  Diagnosis Date  . A-fib ([place])   . Alopecia   . Anemia   . Bradycardia   . Cancer ([place])   . Cherry angioma   . GERD (gastroesophageal reflux disease)   . Glaucoma   . Hypertension   . Pelvic fracture ([place])   . Right BBB/left ant fasc block   . Sick sinus syndrome ([place])   . TIA (transient ischemic attack)   . Xeroderma     Past Surgical History:  Procedure Laterality Date  . APPENDECTOMY    . CERVICAL SPINE SURGERY    . EXPLORATORY LAPAROTOMY    . LITHOTRIPSY    . PACEMAKER PLACEMENT      There were no vitals filed for this visit.  Subjective Assessment - 03/16/18 1457    Subjective  Pt reports that he had a busy morning. He is trying to complete his HEP as instructed. He has soreness like always.     Pertinent History  A-fib, Cervical spine surgery, GERD, TIA, HTN, glaucoma    Patient Stated Goals  decrease pain     Currently in Pain?  Yes    Pain Score  7     Pain Location  Back    Pain Orientation  Right;Left;Lower;Mid    Pain Descriptors / Indicators  Sore    Pain Type  Chronic pain    Pain Radiating Towards  none     Pain Onset  More than a month ago    Pain Frequency  Constant    Aggravating Factors   anything    Pain Relieving Factors  sitting.                       [facility] Adult PT Treatment/Exercise - 03/16/18 0001      Lumbar Exercises: Aerobic   Nustep  L3 x5 min, PT present to discuss response to last session       Lumbar Exercises: Seated   Other Seated Lumbar Exercises  rows with green TB 2x10 reps, PT cues to improve upright posture and scap retraction    Other Seated Lumbar Exercises  thoracic flexion/extension x10 reps       Lumbar Exercises: Supine   Large Ball Oblique Isometric  2 seconds;Other (comment);15 reps red physioball     Other Supine Lumbar Exercises  horizontal abduction with green TB x15 reps     Other Supine Lumbar Exercises  D2 shoulder flexion Lt and Rt with red TB x10 reps each       Lumbar Exercises: Sidelying   Other Sidelying Lumbar Exercises  thoracic rotation Lt/Rt x15 reps each              PT Education - 03/16/18 1523    Education provided  Yes    Education Details  technique with therex; addition to HEP    Person(s) Educated  Patient    Methods  Explanation;Verbal cues;Tactile cues    Comprehension  Verbalized understanding;Returned demonstration       PT Short Term Goals - 03/16/18 1527      PT SHORT TERM GOAL #1   Title  Pt will demo consistency and independence with his HEP to improve flexibility, strength and decrease pain.     Time  3    Period  Weeks    Status  Achieved      PT SHORT TERM GOAL #2   Title  Pt will demo improved thoracic rotation to atleast 30 deg each direction to improve his ability to complete daily activity with less restriction.     Time  3    Period  Weeks    Status  New      PT SHORT TERM GOAL #3   Title  Pt will report atleast 20% reduction in pain from the start of therapy to allow him to assist with chores around the home with less difficulty.    Time  3    Period  Weeks    Status  New      PT SHORT TERM GOAL #4   Title  Pt will demo proper set up and  use of lumbar roll to increase his posture awareness.     Time  3    Status  New        PT Long Term Goals - 03/10/18 1521      PT LONG TERM GOAL #1   Title  Pt will report taking the initiative to attend the gym for water exercise atleast 2 days a week for improved wellness.     Time  6    Period  Weeks    Status  New    [place] Date  04/21/18      PT LONG TERM GOAL #2   Title  Pt will report improved standing/walking tolerance up to atleast 15 minutes, to allow him to assist with grocery shopping without a break.     Time  6    Period  Weeks    Status  New      PT LONG TERM GOAL #3   Title  Pt will report atleast 40% reduction in pain with good understanding of pain mangement techniques at home.     Time  6    Period  Weeks    Status  New      PT LONG TERM GOAL #4   Title  Pt will demo improved BLE strength to 4/5 MMT which will improve his efficiency with daily activity.    Time  6    Period  Weeks    Status  New      PT LONG TERM GOAL #5   Title  Pt will demo increased hamstring flexibility to lacking no more than 30 deg bilaterally which will improve his sitting and standing posture.     Time  6    Period  Weeks    Status  New            Plan - 03/16/18 1524    Clinical Impression Statement  Pt arrived late to his appointment today. He  reports consistent pain levels up to 7 and 8 when completing activity, however there is no mention of pain when therapist asks him during his sessions and the exercises completed. Continued with therex to promote improved spine mobility, strength and postural control. Pt demonstrated good understanding of technique with intermittent cues from the therapist. Will continue with current POC.    Rehab Potential  Good    PT Frequency  2x / week    PT Duration  6 weeks    PT Treatment/Interventions  ADLs/Self Care Home Management;Electrical Stimulation;Cryotherapy;Moist Heat;Gait training;Neuromuscular re-education;Patient/family  education;Therapeutic activities;Therapeutic exercise;Taping;Dry needling;Passive range of motion    PT Next Visit Plan  continue updating HEP to address hip abduction/extension weakness; hip flexor/quad flexibility; trunk strength and mobility     PT Home Exercise Plan  side clams (green)    Consulted and Agree with Plan of Care  Patient       Patient will benefit from skilled therapeutic intervention in order to improve the following deficits and impairments:  Decreased activity tolerance, Decreased balance, Difficulty walking, Impaired flexibility, Hypomobility, Decreased strength, Decreased range of motion, Postural dysfunction, Increased muscle spasms, Decreased mobility, Improper body mechanics, Pain  Visit Diagnosis: Chronic bilateral low back pain without sciatica  Pain in thoracic spine  Muscle weakness (generalized)  Abnormal posture     Problem List Patient Active Problem List   Diagnosis Date Noted  . Post laminectomy syndrome 03/19/2017  . Chronic pain syndrome 03/19/2017  . Chronic bilateral low back pain without sciatica 03/19/2017  . Bradycardia 05/31/2015  . Pacemaker 05/31/2015  . Peripheral edema 05/31/2015  . Heart murmur 05/31/2015  . Sick sinus syndrome ([place]) 05/31/2015  . Nonsustained ventricular tachycardia ([place]) 05/31/2015  . 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 [place], [place], [address] Phone: [phone]   Fax:  [phone]  Name: Jacob Warner MRN: [id number] Date of Birth: Mar 05, 1924

## 2018-03-16 NOTE — Progress Notes (Signed)
Remote pacemaker transmission.   

## 2018-03-17 ENCOUNTER — Encounter: Payer: Self-pay | Admitting: Cardiology

## 2018-03-17 LAB — CUP PACEART REMOTE DEVICE CHECK
Battery Remaining Longevity: 30 mo
Battery Remaining Percentage: 46 %
Brady Statistic RA Percent Paced: 100 %
Brady Statistic RV Percent Paced: 100 %
Date Time Interrogation Session: 20190319091900
Implantable Lead Implant Date: 20130926
Implantable Lead Implant Date: 20130926
Implantable Lead Location: 753859
Implantable Lead Location: 753860
Implantable Lead Model: 4456
Implantable Lead Model: 4476
Implantable Lead Serial Number: 473023
Implantable Lead Serial Number: 523746
Implantable Pulse Generator Implant Date: 20130926
Lead Channel Impedance Value: 362 Ohm
Lead Channel Impedance Value: 443 Ohm
Lead Channel Pacing Threshold Amplitude: 0.5 V
Lead Channel Pacing Threshold Amplitude: 0.9 V
Lead Channel Pacing Threshold Pulse Width: 0.3 ms
Lead Channel Pacing Threshold Pulse Width: 0.4 ms
Lead Channel Setting Pacing Amplitude: 1.4 V
Lead Channel Setting Pacing Amplitude: 2 V
Lead Channel Setting Pacing Pulse Width: 0.4 ms
Lead Channel Setting Sensing Sensitivity: 2.5 mV
Pulse Gen Serial Number: 138586

## 2018-03-19 ENCOUNTER — Encounter: Payer: Medicare Other | Admitting: Physical Therapy

## 2018-03-22 ENCOUNTER — Ambulatory Visit: Payer: Medicare Other | Admitting: Physical Therapy

## 2018-03-22 ENCOUNTER — Encounter: Payer: Self-pay | Admitting: Physical Therapy

## 2018-03-22 DIAGNOSIS — M545 Low back pain, unspecified: Secondary | ICD-10-CM

## 2018-03-22 DIAGNOSIS — R262 Difficulty in walking, not elsewhere classified: Secondary | ICD-10-CM

## 2018-03-22 DIAGNOSIS — R293 Abnormal posture: Secondary | ICD-10-CM

## 2018-03-22 DIAGNOSIS — M546 Pain in thoracic spine: Secondary | ICD-10-CM

## 2018-03-22 DIAGNOSIS — M6281 Muscle weakness (generalized): Secondary | ICD-10-CM

## 2018-03-22 DIAGNOSIS — G8929 Other chronic pain: Secondary | ICD-10-CM

## 2018-03-22 NOTE — Therapy (Signed)
[facility] [address] [place], [place], [address] Phone: [phone]   Fax:  [phone]  Physical Therapy Treatment  Patient Details  Name: Jacob Warner MRN: [id number] Date of Birth: [birth year] Referring Provider: Magnus Sinning, MD   Encounter Date: 03/22/2018  PT End of Session - 03/22/18 1008    Visit Number  4    Date for PT Re-Evaluation  04/21/18    Authorization Type  UHC Medicare     Authorization Time Period  03/10/18 to 04/21/18    PT Start Time  0935 pt arrived late    PT Stop Time  1013    PT Time Calculation (min)  38 min    Activity Tolerance  Patient tolerated treatment well;No increased pain    Behavior During Therapy  WFL for tasks assessed/performed       Past Medical History:  Diagnosis Date  . A-fib ([place])   . Alopecia   . Anemia   . Bradycardia   . Cancer ([place])   . Cherry angioma   . GERD (gastroesophageal reflux disease)   . Glaucoma   . Hypertension   . Pelvic fracture ([place])   . Right BBB/left ant fasc block   . Sick sinus syndrome ([place])   . TIA (transient ischemic attack)   . Xeroderma     Past Surgical History:  Procedure Laterality Date  . APPENDECTOMY    . CERVICAL SPINE SURGERY    . EXPLORATORY LAPAROTOMY    . LITHOTRIPSY    . PACEMAKER PLACEMENT      There were no vitals filed for this visit.  Subjective Assessment - 03/22/18 0938    Subjective  "my back is killing me today.  it's the nerves."    Patient Stated Goals  decrease pain     Currently in Pain?  Yes    Pain Score  8     Pain Location  Back    Pain Orientation  Right;Left;Mid;Lower    Pain Descriptors / Indicators  Sore    Pain Type  Chronic pain    Pain Onset  More than a month ago    Pain Frequency  Constant    Aggravating Factors   anything    Pain Relieving Factors  sitting                No data recorded       [facility] Adult PT Treatment/Exercise - 03/22/18 0939      Self-Care   Self-Care  Other Self-Care Comments    Other Self-Care Comments   discussed exercises pt is performing at home: quad sets, bridge, SKTC x 10 sec, heel raises, standing hip abduction, hip adductor stretch, ball squeeze, sidelying clamshells with red theraband, hamstring and ITB stretch.  no exercises aggravate low back, but don't seem to be helping      Lumbar Exercises: Stretches   Sports administrator  Right;Left;3 reps;20 seconds prone with strap      Lumbar Exercises: Aerobic   Nustep  L5 x 6 min      Lumbar Exercises: Seated   Long Arc Quad on Chair  Both;15 reps      Lumbar Exercises: Supine   Straight Leg Raise  15 reps 2#    Large Ball Oblique Isometric  15 reps red physioball; trunk rotation    Other Supine Lumbar Exercises  hamstring curl on red physioball x 15      Lumbar Exercises: Sidelying   Hip  Abduction  Both;15 reps 2#               PT Short Term Goals - 03/16/18 1527      PT SHORT TERM GOAL #1   Title  Pt will demo consistency and independence with his HEP to improve flexibility, strength and decrease pain.     Time  3    Period  Weeks    Status  Achieved      PT SHORT TERM GOAL #2   Title  Pt will demo improved thoracic rotation to atleast 30 deg each direction to improve his ability to complete daily activity with less restriction.     Time  3    Period  Weeks    Status  New      PT SHORT TERM GOAL #3   Title  Pt will report atleast 20% reduction in pain from the start of therapy to allow him to assist with chores around the home with less difficulty.    Time  3    Period  Weeks    Status  New      PT SHORT TERM GOAL #4   Title  Pt will demo proper set up and use of lumbar roll to increase his posture awareness.     Time  3    Status  New        PT Long Term Goals - 03/10/18 1521      PT LONG TERM GOAL #1   Title  Pt will report taking the initiative to attend the gym for water exercise atleast 2 days a week for improved wellness.      Time  6    Period  Weeks    Status  New    [place] Date  04/21/18      PT LONG TERM GOAL #2   Title  Pt will report improved standing/walking tolerance up to atleast 15 minutes, to allow him to assist with grocery shopping without a break.     Time  6    Period  Weeks    Status  New      PT LONG TERM GOAL #3   Title  Pt will report atleast 40% reduction in pain with good understanding of pain mangement techniques at home.     Time  6    Period  Weeks    Status  New      PT LONG TERM GOAL #4   Title  Pt will demo improved BLE strength to 4/5 MMT which will improve his efficiency with daily activity.    Time  6    Period  Weeks    Status  New      PT LONG TERM GOAL #5   Title  Pt will demo increased hamstring flexibility to lacking no more than 30 deg bilaterally which will improve his sitting and standing posture.     Time  6    Period  Weeks    Status  New            Plan - 03/22/18 1009    Clinical Impression Statement  Pt tolerated session well today despite reported elevated pain. Pt stating pain is due to "nerve damage and there's nothing you can do to help it."  Will continue to benefit from PT to maximize function.    PT Treatment/Interventions  ADLs/Self Care Home Management;Electrical Stimulation;Cryotherapy;Moist Heat;Gait training;Neuromuscular re-education;Patient/family education;Therapeutic activities;Therapeutic exercise;Taping;Dry needling;Passive range of motion    PT  Next Visit Plan  continue updating HEP to address hip abduction/extension weakness; hip flexor/quad flexibility; trunk strength and mobility     Consulted and Agree with Plan of Care  Patient       Patient will benefit from skilled therapeutic intervention in order to improve the following deficits and impairments:  Decreased activity tolerance, Decreased balance, Difficulty walking, Impaired flexibility, Hypomobility, Decreased strength, Decreased range of motion, Postural dysfunction,  Increased muscle spasms, Decreased mobility, Improper body mechanics, Pain  Visit Diagnosis: Chronic bilateral low back pain without sciatica  Pain in thoracic spine  Muscle weakness (generalized)  Chronic left-sided low back pain, with sciatica presence unspecified  Abnormal posture  Difficulty in walking, not elsewhere classified     Problem List Patient Active Problem List   Diagnosis Date Noted  . Post laminectomy syndrome 03/19/2017  . Chronic pain syndrome 03/19/2017  . Chronic bilateral low back pain without sciatica 03/19/2017  . Bradycardia 05/31/2015  . Pacemaker 05/31/2015  . Peripheral edema 05/31/2015  . Heart murmur 05/31/2015  . Sick sinus syndrome ([place]) 05/31/2015  . Nonsustained ventricular tachycardia ([place]) 05/31/2015  . 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, [place], [place], [address] Phone: [phone]   Fax:  [phone]  Name: HULAN SZUMSKI MRN: [id number] Date of Birth: Mar 30, 1924

## 2018-03-24 ENCOUNTER — Encounter: Payer: Self-pay | Admitting: Physical Therapy

## 2018-03-24 ENCOUNTER — Ambulatory Visit: Payer: Medicare Other | Admitting: Physical Therapy

## 2018-03-24 DIAGNOSIS — R293 Abnormal posture: Secondary | ICD-10-CM

## 2018-03-24 DIAGNOSIS — M545 Low back pain, unspecified: Secondary | ICD-10-CM

## 2018-03-24 DIAGNOSIS — M6281 Muscle weakness (generalized): Secondary | ICD-10-CM

## 2018-03-24 DIAGNOSIS — G8929 Other chronic pain: Secondary | ICD-10-CM

## 2018-03-24 DIAGNOSIS — R262 Difficulty in walking, not elsewhere classified: Secondary | ICD-10-CM

## 2018-03-24 DIAGNOSIS — M546 Pain in thoracic spine: Secondary | ICD-10-CM

## 2018-03-24 NOTE — Therapy (Signed)
[facility] [address] [place], [place], [address] Phone: [phone]   Fax:  [phone]  Physical Therapy Treatment  Patient Details  Name: Jacob Warner MRN: [id number] Date of Birth: [birth year] Referring Provider: Magnus Sinning, MD   Encounter Date: 03/24/2018  PT End of Session - 03/24/18 1050    Visit Number  5    Date for PT Re-Evaluation  04/21/18    Authorization Type  UHC Medicare     Authorization Time Period  03/10/18 to 04/21/18    PT Start Time  1018    PT Stop Time  1056    PT Time Calculation (min)  38 min    Activity Tolerance  Patient tolerated treatment well;No increased pain    Behavior During Therapy  WFL for tasks assessed/performed       Past Medical History:  Diagnosis Date  . A-fib ([place])   . Alopecia   . Anemia   . Bradycardia   . Cancer ([place])   . Cherry angioma   . GERD (gastroesophageal reflux disease)   . Glaucoma   . Hypertension   . Pelvic fracture ([place])   . Right BBB/left ant fasc block   . Sick sinus syndrome ([place])   . TIA (transient ischemic attack)   . Xeroderma     Past Surgical History:  Procedure Laterality Date  . APPENDECTOMY    . CERVICAL SPINE SURGERY    . EXPLORATORY LAPAROTOMY    . LITHOTRIPSY    . PACEMAKER PLACEMENT      There were no vitals filed for this visit.  Subjective Assessment - 03/24/18 1021    Subjective  "my back is terrible. there's nothing you can do about it."    Patient Stated Goals  decrease pain     Currently in Pain?  Yes    Pain Score  8     Pain Location  Back    Pain Orientation  Mid;Lower;Right;Left    Pain Descriptors / Indicators  Sore    Pain Onset  More than a month ago    Pain Frequency  Constant    Aggravating Factors   anything    Pain Relieving Factors  sitting                No data recorded       [facility] Adult PT Treatment/Exercise - 03/24/18 1022      Lumbar Exercises: Stretches   Single Knee to Chest Stretch  Right;Left;3 reps;30 seconds    Lower Trunk Rotation  3 reps;30 seconds      Lumbar Exercises: Aerobic   Nustep  L3 x 8 min      Lumbar Exercises: Seated   Long Arc Quad on [place]  Both;15 reps;Weights on balance disc    LAQ on [employer] (lbs)  2    Hip Flexion on Ball  Both;15 reps 2#; on balance disc      Lumbar Exercises: Supine   Pelvic Tilt  15 reps;5 seconds    Other Supine Lumbar Exercises  hooklying single limb clamshell with green theraband x 15 reps bil             PT Education - 03/24/18 1048    Education provided  Yes    Education Details  current progress, plan to work on d/c next few sessions    Person(s) Educated  Patient    Methods  Explanation    Comprehension  Verbalized understanding  PT Short Term Goals - 03/16/18 1527      PT SHORT TERM GOAL #1   Title  Pt will demo consistency and independence with his HEP to improve flexibility, strength and decrease pain.     Time  3    Period  Weeks    Status  Achieved      PT SHORT TERM GOAL #2   Title  Pt will demo improved thoracic rotation to atleast 30 deg each direction to improve his ability to complete daily activity with less restriction.     Time  3    Period  Weeks    Status  New      PT SHORT TERM GOAL #3   Title  Pt will report atleast 20% reduction in pain from the start of therapy to allow him to assist with chores around the home with less difficulty.    Time  3    Period  Weeks    Status  New      PT SHORT TERM GOAL #4   Title  Pt will demo proper set up and use of lumbar roll to increase his posture awareness.     Time  3    Status  New        PT Long Term Goals - 03/10/18 1521      PT LONG TERM GOAL #1   Title  Pt will report taking the initiative to attend the gym for water exercise atleast 2 days a week for improved wellness.     Time  6    Period  Weeks    Status  New    [place] Date  04/21/18      PT LONG TERM GOAL #2   Title  Pt  will report improved standing/walking tolerance up to atleast 15 minutes, to allow him to assist with grocery shopping without a break.     Time  6    Period  Weeks    Status  New      PT LONG TERM GOAL #3   Title  Pt will report atleast 40% reduction in pain with good understanding of pain mangement techniques at home.     Time  6    Period  Weeks    Status  New      PT LONG TERM GOAL #4   Title  Pt will demo improved BLE strength to 4/5 MMT which will improve his efficiency with daily activity.    Time  6    Period  Weeks    Status  New      PT LONG TERM GOAL #5   Title  Pt will demo increased hamstring flexibility to lacking no more than 30 deg bilaterally which will improve his sitting and standing posture.     Time  6    Period  Weeks    Status  New            Plan - 03/24/18 1050    Clinical Impression Statement  Pt reports no change at this time in symptoms, but reports exercises do not exacerbate pain.   Has extensive HEP and may need to consider d/c due to limited progress.  Pt in agreement at this time.    PT Treatment/Interventions  ADLs/Self Care Home Management;Electrical Stimulation;Cryotherapy;Moist Heat;Gait training;Neuromuscular re-education;Patient/family education;Therapeutic activities;Therapeutic exercise;Taping;Dry needling;Passive range of motion    PT Next Visit Plan  continue updating HEP to address hip abduction/extension weakness; hip flexor/quad flexibility; trunk strength  and mobility     Consulted and Agree with Plan of Care  Patient       Patient will benefit from skilled therapeutic intervention in order to improve the following deficits and impairments:  Decreased activity tolerance, Decreased balance, Difficulty walking, Impaired flexibility, Hypomobility, Decreased strength, Decreased range of motion, Postural dysfunction, Increased muscle spasms, Decreased mobility, Improper body mechanics, Pain  Visit Diagnosis: Chronic bilateral low back  pain without sciatica  Pain in thoracic spine  Muscle weakness (generalized)  Chronic left-sided low back pain, with sciatica presence unspecified  Abnormal posture  Difficulty in walking, not elsewhere classified     Problem List Patient Active Problem List   Diagnosis Date Noted  . Post laminectomy syndrome 03/19/2017  . Chronic pain syndrome 03/19/2017  . Chronic bilateral low back pain without sciatica 03/19/2017  . Bradycardia 05/31/2015  . Pacemaker 05/31/2015  . Peripheral edema 05/31/2015  . Heart murmur 05/31/2015  . Sick sinus syndrome ([place]) 05/31/2015  . Nonsustained ventricular tachycardia ([place]) 05/31/2015  . Follicular lymphoma ([place]) 04/02/2015      Laureen Abrahams, PT, DPT 03/24/18 10:52 AM     [facility] [address] [place], [place], [address] Phone: [phone]   Fax:  [phone]  Name: KEHINDE BOWDISH MRN: [id number] Date of Birth: [birth year]

## 2018-03-29 ENCOUNTER — Encounter: Payer: Self-pay | Admitting: Physical Therapy

## 2018-03-29 ENCOUNTER — Ambulatory Visit: Payer: Medicare Other | Attending: Physical Medicine and Rehabilitation | Admitting: Physical Therapy

## 2018-03-29 DIAGNOSIS — G8929 Other chronic pain: Secondary | ICD-10-CM | POA: Insufficient documentation

## 2018-03-29 DIAGNOSIS — R293 Abnormal posture: Secondary | ICD-10-CM | POA: Diagnosis present

## 2018-03-29 DIAGNOSIS — M545 Low back pain, unspecified: Secondary | ICD-10-CM

## 2018-03-29 DIAGNOSIS — M546 Pain in thoracic spine: Secondary | ICD-10-CM | POA: Insufficient documentation

## 2018-03-29 DIAGNOSIS — R262 Difficulty in walking, not elsewhere classified: Secondary | ICD-10-CM | POA: Diagnosis present

## 2018-03-29 DIAGNOSIS — M6281 Muscle weakness (generalized): Secondary | ICD-10-CM | POA: Insufficient documentation

## 2018-03-29 NOTE — Patient Instructions (Signed)

## 2018-03-29 NOTE — Therapy (Signed)
[facility] [address], [place], [place], [address] Phone: [phone]   Fax:  [phone]  Physical Therapy Treatment  Patient Details  Name: Jacob Warner MRN: [id number] Date of Birth: 29-Dec-1924 Referring Provider: Magnus Sinning, MD   Encounter Date: 03/29/2018  PT End of Session - 03/29/18 1008    Visit Number  6    Date for PT Re-Evaluation  04/21/18    Authorization Type  [employer]HC Medicare     Authorization Time Period  03/10/18 to 04/21/18    PT Start Time  0930    PT Stop Time  1010    PT Time Calculation (min)  40 min    Activity Tolerance  Patient tolerated treatment well;No increased pain    Behavior During Therapy  WFL for tasks assessed/performed       Past Medical History:  Diagnosis Date  . A-fib ([place])   . Alopecia   . Anemia   . Bradycardia   . Cancer ([place])   . [place] angioma   . GERD (gastroesophageal reflux disease)   . Glaucoma   . Hypertension   . Pelvic fracture ([place])   . Right BBB/left ant fasc block   . Sick sinus syndrome ([place])   . TIA (transient ischemic attack)   . Xeroderma     Past Surgical History:  Procedure Laterality Date  . APPENDECTOMY    . CERVICAL SPINE SURGERY    . EXPLORATORY LAPAROTOMY    . LITHOTRIPSY    . PACEMAKER PLACEMENT      There were no vitals filed for this visit.  Subjective Assessment - 03/29/18 0936    Subjective  both shoulders are doing pretty well, Rt is giving him "a little trouble."    Patient Stated Goals  decrease pain     Currently in Pain?  Yes    Pain Score  4     Pain Location  Shoulder    Pain Orientation  Right    Pain Descriptors / Indicators  Sore    Pain Type  Chronic pain    Pain Onset  More than a month ago    Pain Frequency  Constant    Aggravating Factors   "I don't know"    Pain Relieving Factors  massage                No data recorded       [facility] Adult PT Treatment/Exercise - 03/29/18 0938       Lumbar Exercises: Aerobic   Nustep  L5 x 8 min      Manual Therapy   Manual Therapy  Soft tissue mobilization    Manual therapy comments  skilled palpation of soft tissue during TPDN    Soft tissue mobilization  Rt UT/ LS/infraspinatus with IASTM       Trigger Point Dry Needling - 03/29/18 1006    Consent Given?  Yes    Education Handout Provided  Yes    Muscles Treated Upper Body  Upper trapezius;Levator scapulae;Infraspinatus    Upper Trapezius Response  Twitch reponse elicited;Palpable increased muscle length    Levator Scapulae Response  Twitch response elicited;Palpable increased muscle length    Infraspinatus Response  Twitch response elicited;Palpable increased muscle length           PT Education - 03/29/18 1007    Education provided  Yes    Education Details  DN    Person(s) Educated  Patient  Methods  Explanation;Handout    Comprehension  Verbalized understanding       PT Short Term Goals - 03/16/18 1527      PT SHORT TERM GOAL #1   Title  Pt will demo consistency and independence with his HEP to improve flexibility, strength and decrease pain.     Time  3    Period  Weeks    Status  Achieved      PT SHORT TERM GOAL #2   Title  Pt will demo improved thoracic rotation to atleast 30 deg each direction to improve his ability to complete daily activity with less restriction.     Time  3    Period  Weeks    Status  New      PT SHORT TERM GOAL #3   Title  Pt will report atleast 20% reduction in pain from the start of therapy to allow him to assist with chores around the home with less difficulty.    Time  3    Period  Weeks    Status  New      PT SHORT TERM GOAL #4   Title  Pt will demo proper set up and use of lumbar roll to increase his posture awareness.     Time  3    Status  New        PT Long Term Goals - 03/10/18 1521      PT LONG TERM GOAL #1   Title  Pt will report taking the initiative to attend the gym for water exercise atleast 2  days a week for improved wellness.     Time  6    Period  Weeks    Status  New    [place] Date  04/21/18      PT LONG TERM GOAL #2   Title  Pt will report improved standing/walking tolerance up to atleast 15 minutes, to allow him to assist with grocery shopping without a break.     Time  6    Period  Weeks    Status  New      PT LONG TERM GOAL #3   Title  Pt will report atleast 40% reduction in pain with good understanding of pain mangement techniques at home.     Time  6    Period  Weeks    Status  New      PT LONG TERM GOAL #4   Title  Pt will demo improved BLE strength to 4/5 MMT which will improve his efficiency with daily activity.    Time  6    Period  Weeks    Status  New      PT LONG TERM GOAL #5   Title  Pt will demo increased hamstring flexibility to lacking no more than 30 deg bilaterally which will improve his sitting and standing posture.     Time  6    Period  Weeks    Status  New            Plan - 03/29/18 1008    Clinical Impression Statement  Session today focused on DN and manual therapy to Rt posterior shoulder trigger points.  Will plan to focus on shoulder pain and address back pain PRN.    PT Treatment/Interventions  ADLs/Self Care Home Management;Electrical Stimulation;Cryotherapy;Moist Heat;Gait training;Neuromuscular re-education;Patient/family education;Therapeutic activities;Therapeutic exercise;Taping;Dry needling;Passive range of motion    PT Next Visit Plan  look at STGs, assess response to DN, posture/shoulder  strengthening    Consulted and Agree with Plan of Care  Patient       Patient will benefit from skilled therapeutic intervention in order to improve the following deficits and impairments:  Decreased activity tolerance, Decreased balance, Difficulty walking, Impaired flexibility, Hypomobility, Decreased strength, Decreased range of motion, Postural dysfunction, Increased muscle spasms, Decreased mobility, Improper body mechanics,  Pain  Visit Diagnosis: Chronic bilateral low back pain without sciatica  Pain in thoracic spine  Muscle weakness (generalized)  Chronic left-sided low back pain, with sciatica presence unspecified  Abnormal posture  Difficulty in walking, not elsewhere classified     Problem List Patient Active Problem List   Diagnosis Date Noted  . Post laminectomy syndrome 03/19/2017  . Chronic pain syndrome 03/19/2017  . Chronic bilateral low back pain without sciatica 03/19/2017  . Bradycardia 05/31/2015  . Pacemaker 05/31/2015  . Peripheral edema 05/31/2015  . Heart murmur 05/31/2015  . Sick sinus syndrome ([place]) 05/31/2015  . Nonsustained ventricular tachycardia ([place]) 05/31/2015  . Follicular lymphoma ([place]) 04/02/2015      Laureen Abrahams, PT, DPT 03/29/18 10:10 AM    [facility] [address], [place], [place], [address] Phone: [phone]   Fax:  [phone]  Name: Jacob Warner MRN: [id number] Date of Birth: 07/15/1924

## 2018-03-31 ENCOUNTER — Ambulatory Visit: Payer: Medicare Other | Admitting: Physical Therapy

## 2018-03-31 ENCOUNTER — Encounter: Payer: Self-pay | Admitting: Physical Therapy

## 2018-03-31 DIAGNOSIS — M545 Low back pain, unspecified: Secondary | ICD-10-CM

## 2018-03-31 DIAGNOSIS — R293 Abnormal posture: Secondary | ICD-10-CM

## 2018-03-31 DIAGNOSIS — M6281 Muscle weakness (generalized): Secondary | ICD-10-CM

## 2018-03-31 DIAGNOSIS — M546 Pain in thoracic spine: Secondary | ICD-10-CM

## 2018-03-31 DIAGNOSIS — G8929 Other chronic pain: Secondary | ICD-10-CM

## 2018-03-31 NOTE — Therapy (Signed)
[facility] [address] [place], [place], [address] Phone: [phone]   Fax:  [phone]  Physical Therapy Treatment  Patient Details  Name: Jacob Warner MRN: [id number] Date of Birth: 09-12-1924 Referring Provider: Magnus Sinning, MD   Encounter Date: 03/31/2018  PT End of Session - 03/31/18 1220    Visit Number  7    Date for PT Re-Evaluation  04/21/18    Authorization Type  UHC Medicare     Authorization Time Period  03/10/18 to 04/21/18    PT Start Time  1141    PT Stop Time  1222    PT Time Calculation (min)  41 min    Activity Tolerance  Patient tolerated treatment well;No increased pain    Behavior During Therapy  WFL for tasks assessed/performed       Past Medical History:  Diagnosis Date  . A-fib ([place])   . Alopecia   . Anemia   . Bradycardia   . Cancer ([place])   . Cherry angioma   . GERD (gastroesophageal reflux disease)   . Glaucoma   . Hypertension   . Pelvic fracture ([place])   . Right BBB/left ant fasc block   . Sick sinus syndrome ([place])   . TIA (transient ischemic attack)   . Xeroderma     Past Surgical History:  Procedure Laterality Date  . APPENDECTOMY    . CERVICAL SPINE SURGERY    . EXPLORATORY LAPAROTOMY    . LITHOTRIPSY    . PACEMAKER PLACEMENT      There were no vitals filed for this visit.  Subjective Assessment - 03/31/18 1144    Subjective  still having back pain, but reports it's nerves and continues to state "it won't get any better."  felt like DN helped shoulder.  shoulder "60% improved;" back "20% improved"    Patient Stated Goals  decrease pain     Currently in Pain?  Yes    Pain Score  2     Pain Location  Shoulder    Pain Orientation  Right    Pain Descriptors / Indicators  Sore    Pain Type  Chronic pain    Pain Onset  More than a month ago    Pain Frequency  Constant    Aggravating Factors   "I don't know"    Pain Relieving Factors  massage, DN                        [facility] Adult PT Treatment/Exercise - 03/31/18 1151      Exercises   Exercises  Shoulder      Lumbar Exercises: Aerobic   Nustep  L5 x 8 min      Shoulder Exercises: Supine   Horizontal ABduction  Both;20 reps;Theraband    Theraband Level (Shoulder Horizontal ABduction)  Level 3 (Green)    External Rotation  Both;20 reps;Theraband    Theraband Level (Shoulder External Rotation)  Level 3 (Green)    Flexion  Both;20 reps;Theraband    Theraband Level (Shoulder Flexion)  Level 3 (Green)    Other Supine Exercises  cervical retraction x20 reps    Other Supine Exercises  scapular retraction x20 reps      Shoulder Exercises: Seated   Retraction  Both;20 reps;Theraband    Theraband Level (Shoulder Retraction)  Level 3 (Green)    Horizontal ABduction  Both;20 reps;Theraband    Theraband Level (Shoulder Horizontal ABduction)  Level  3 (Green)      Shoulder Exercises: Pulleys   Flexion  3 minutes    Scaption  3 minutes               PT Short Term Goals - 03/31/18 1220      PT SHORT TERM GOAL #1   Title  Pt will demo consistency and independence with his HEP to improve flexibility, strength and decrease pain.     Time  3    Period  Weeks    Status  Achieved      PT SHORT TERM GOAL #2   Title  Pt will demo improved thoracic rotation to atleast 30 deg each direction to improve his ability to complete daily activity with less restriction.     Time  3    Period  Weeks    Status  Achieved      PT SHORT TERM GOAL #3   Title  Pt will report atleast 20% reduction in pain from the start of therapy to allow him to assist with chores around the home with less difficulty.    Time  3    Period  Weeks    Status  Achieved      PT SHORT TERM GOAL #4   Title  Pt will demo proper set up and use of lumbar roll to increase his posture awareness.     Time  3    Status  Achieved        PT Long Term Goals - 03/10/18 1521      PT LONG TERM GOAL #1    Title  Pt will report taking the initiative to attend the gym for water exercise atleast 2 days a week for improved wellness.     Time  6    Period  Weeks    Status  New    Target Date  04/21/18      PT LONG TERM GOAL #2   Title  Pt will report improved standing/walking tolerance up to atleast 15 minutes, to allow him to assist with grocery shopping without a break.     Time  6    Period  Weeks    Status  New      PT LONG TERM GOAL #3   Title  Pt will report atleast 40% reduction in pain with good understanding of pain mangement techniques at home.     Time  6    Period  Weeks    Status  New      PT LONG TERM GOAL #4   Title  Pt will demo improved BLE strength to 4/5 MMT which will improve his efficiency with daily activity.    Time  6    Period  Weeks    Status  New      PT LONG TERM GOAL #5   Title  Pt will demo increased hamstring flexibility to lacking no more than 30 deg bilaterally which will improve his sitting and standing posture.     Time  6    Period  Weeks    Status  New            Plan - 03/31/18 1220    Clinical Impression Statement  Pt tolerated session well today focusing on posture and posterior shoulder strengthening.  Will continue to benefit from PT to maximize function.  Good response to DN from previous session.    PT Treatment/Interventions  ADLs/Self Care Home Management;Electrical Stimulation;Cryotherapy;Moist Heat;Gait training;Neuromuscular  re-education;Patient/family education;Therapeutic activities;Therapeutic exercise;Taping;Dry needling;Passive range of motion    PT Next Visit Plan  FOTO, continue DN PRN, posture/shoulder strengthening, address LBP and core/hip strengthening PRN    Consulted and Agree with Plan of Care  Patient       Patient will benefit from skilled therapeutic intervention in order to improve the following deficits and impairments:  Decreased activity tolerance, Decreased balance, Difficulty walking, Impaired flexibility,  Hypomobility, Decreased strength, Decreased range of motion, Postural dysfunction, Increased muscle spasms, Decreased mobility, Improper body mechanics, Pain  Visit Diagnosis: Chronic bilateral low back pain without sciatica  Pain in thoracic spine  Muscle weakness (generalized)  Chronic left-sided low back pain, with sciatica presence unspecified  Abnormal posture     Problem List Patient Active Problem List   Diagnosis Date Noted  . Post laminectomy syndrome 03/19/2017  . Chronic pain syndrome 03/19/2017  . Chronic bilateral low back pain without sciatica 03/19/2017  . Bradycardia 05/31/2015  . Pacemaker 05/31/2015  . Peripheral edema 05/31/2015  . Heart murmur 05/31/2015  . Sick sinus syndrome ([place]) 05/31/2015  . Nonsustained ventricular tachycardia ([place]) 05/31/2015  . Follicular lymphoma ([place]) 04/02/2015      Laureen Abrahams, PT, DPT 03/31/18 12:23 PM     [facility] [address] [place], [place], [address] Phone: [phone]   Fax:  [phone]  Name: Jacob Warner MRN: [id number] Date of Birth: 29-Jul-1924

## 2018-04-05 ENCOUNTER — Encounter: Payer: Self-pay | Admitting: Physical Therapy

## 2018-04-05 ENCOUNTER — Ambulatory Visit: Payer: Medicare Other | Admitting: Physical Therapy

## 2018-04-05 DIAGNOSIS — M545 Low back pain, unspecified: Secondary | ICD-10-CM

## 2018-04-05 DIAGNOSIS — R262 Difficulty in walking, not elsewhere classified: Secondary | ICD-10-CM

## 2018-04-05 DIAGNOSIS — G8929 Other chronic pain: Secondary | ICD-10-CM

## 2018-04-05 DIAGNOSIS — M6281 Muscle weakness (generalized): Secondary | ICD-10-CM

## 2018-04-05 DIAGNOSIS — R293 Abnormal posture: Secondary | ICD-10-CM

## 2018-04-05 DIAGNOSIS — M546 Pain in thoracic spine: Secondary | ICD-10-CM

## 2018-04-05 NOTE — Therapy (Signed)
[facility] [address], [place], [place], [address] Phone: [phone]   Fax:  [phone]  Physical Therapy Treatment  Patient Details  Name: Jacob Warner MRN: [id number] Date of Birth: 1924/07/12 Referring Provider: Magnus Sinning, MD   Encounter Date: 04/05/2018  PT End of Session - 04/05/18 1137    Visit Number  8    Date for PT Re-Evaluation  04/21/18    Authorization Type  UHC Medicare     Authorization Time Period  03/10/18 to 04/21/18    PT Start Time  1055    PT Stop Time  1138    PT Time Calculation (min)  43 min    Activity Tolerance  Patient tolerated treatment well;No increased pain    Behavior During Therapy  WFL for tasks assessed/performed       Past Medical History:  Diagnosis Date  . A-fib ([place])   . Alopecia   . Anemia   . Bradycardia   . Cancer ([place])   . Cherry angioma   . GERD (gastroesophageal reflux disease)   . Glaucoma   . Hypertension   . Pelvic fracture ([place])   . Right BBB/left ant fasc block   . Sick sinus syndrome ([place])   . TIA (transient ischemic attack)   . Xeroderma     Past Surgical History:  Procedure Laterality Date  . APPENDECTOMY    . CERVICAL SPINE SURGERY    . EXPLORATORY LAPAROTOMY    . LITHOTRIPSY    . PACEMAKER PLACEMENT      There were no vitals filed for this visit.  Subjective Assessment - 04/05/18 1059    Subjective  doing well; DN really helped shoulder and wants to repeat today.    Pertinent History  A-fib, Cervical spine surgery, GERD, TIA, HTN, glaucoma    Patient Stated Goals  decrease pain     Currently in Pain?  Yes    Pain Score  2  "very little"    Pain Location  Shoulder    Pain Orientation  Right    Pain Descriptors / Indicators  Sore    Pain Type  Chronic pain    Pain Onset  More than a month ago    Pain Frequency  Constant    Aggravating Factors   "I don't know"    Pain Relieving Factors  massage, DN                        [facility] Adult PT Treatment/Exercise - 04/05/18 1100      Shoulder Exercises: ROM/Strengthening   UBE (Upper Arm Bike)  L3 x 6 min (3' fwd/3' bwd)      Shoulder Exercises: Stretch   Cross Chest Stretch  3 reps;30 seconds    Other Shoulder Stretches  upper trap stretch Rt side 3x30 sec      Manual Therapy   Manual Therapy  Soft tissue mobilization    Manual therapy comments  skilled palpation of soft tissue during TPDN    Soft tissue mobilization  Rt UT/ LS/infraspinatus with IASTM       Trigger Point Dry Needling - 04/05/18 1128    Consent Given?  Yes    Muscles Treated Upper Body  Upper trapezius;Infraspinatus    Upper Trapezius Response  Palpable increased muscle length;Twitch reponse elicited    Infraspinatus Response  Twitch response elicited;Palpable increased muscle length and teres minor  PT Short Term Goals - 03/31/18 1220      PT SHORT TERM GOAL #1   Title  Pt will demo consistency and independence with his HEP to improve flexibility, strength and decrease pain.     Time  3    Period  Weeks    Status  Achieved      PT SHORT TERM GOAL #2   Title  Pt will demo improved thoracic rotation to atleast 30 deg each direction to improve his ability to complete daily activity with less restriction.     Time  3    Period  Weeks    Status  Achieved      PT SHORT TERM GOAL #3   Title  Pt will report atleast 20% reduction in pain from the start of therapy to allow him to assist with chores around the home with less difficulty.    Time  3    Period  Weeks    Status  Achieved      PT SHORT TERM GOAL #4   Title  Pt will demo proper set up and use of lumbar roll to increase his posture awareness.     Time  3    Status  Achieved        PT Long Term Goals - 03/10/18 1521      PT LONG TERM GOAL #1   Title  Pt will report taking the initiative to attend the gym for water exercise atleast 2 days a week for improved wellness.      Time  6    Period  Weeks    Status  New    Target Date  04/21/18      PT LONG TERM GOAL #2   Title  Pt will report improved standing/walking tolerance up to atleast 15 minutes, to allow him to assist with grocery shopping without a break.     Time  6    Period  Weeks    Status  New      PT LONG TERM GOAL #3   Title  Pt will report atleast 40% reduction in pain with good understanding of pain mangement techniques at home.     Time  6    Period  Weeks    Status  New      PT LONG TERM GOAL #4   Title  Pt will demo improved BLE strength to 4/5 MMT which will improve his efficiency with daily activity.    Time  6    Period  Weeks    Status  New      PT LONG TERM GOAL #5   Title  Pt will demo increased hamstring flexibility to lacking no more than 30 deg bilaterally which will improve his sitting and standing posture.     Time  6    Period  Weeks    Status  New            Plan - 04/05/18 1137    Clinical Impression Statement  Pt reported great response from DN last week so requested again today, with good twitch responses in all muscle groups.  IASTM following DN with pt reporting decreased active trigger points.  Pt needs min cues for postural awareness throughout session.  Progressing well with PT.    PT Treatment/Interventions  ADLs/Self Care Home Management;Electrical Stimulation;Cryotherapy;Moist Heat;Gait training;Neuromuscular re-education;Patient/family education;Therapeutic activities;Therapeutic exercise;Taping;Dry needling;Passive range of motion    PT Next Visit Plan  FOTO, continue DN PRN, posture/shoulder  strengthening, address LBP and core/hip strengthening PRN    Consulted and Agree with Plan of Care  Patient       Patient will benefit from skilled therapeutic intervention in order to improve the following deficits and impairments:  Decreased activity tolerance, Decreased balance, Difficulty walking, Impaired flexibility, Hypomobility, Decreased strength,  Decreased range of motion, Postural dysfunction, Increased muscle spasms, Decreased mobility, Improper body mechanics, Pain  Visit Diagnosis: Chronic bilateral low back pain without sciatica  Pain in thoracic spine  Muscle weakness (generalized)  Chronic left-sided low back pain, with sciatica presence unspecified  Abnormal posture  Difficulty in walking, not elsewhere classified     Problem List Patient Active Problem List   Diagnosis Date Noted  . Post laminectomy syndrome 03/19/2017  . Chronic pain syndrome 03/19/2017  . Chronic bilateral low back pain without sciatica 03/19/2017  . Bradycardia 05/31/2015  . Pacemaker 05/31/2015  . Peripheral edema 05/31/2015  . Heart murmur 05/31/2015  . Sick sinus syndrome ([place]) 05/31/2015  . Nonsustained ventricular tachycardia ([place]) 05/31/2015  . Follicular lymphoma ([place]) 04/02/2015      Laureen Abrahams, PT, DPT 04/05/18 11:40 AM    [facility] [address] [place], [place], [address] Phone: [phone]   Fax:  [phone]  Name: Jacob Warner MRN: [id number] Date of Birth: July 08, 1924

## 2018-04-07 ENCOUNTER — Ambulatory Visit: Payer: Medicare Other | Admitting: Physical Therapy

## 2018-04-07 ENCOUNTER — Encounter: Payer: Self-pay | Admitting: Physical Therapy

## 2018-04-07 DIAGNOSIS — M546 Pain in thoracic spine: Secondary | ICD-10-CM

## 2018-04-07 DIAGNOSIS — M545 Low back pain, unspecified: Secondary | ICD-10-CM

## 2018-04-07 DIAGNOSIS — M6281 Muscle weakness (generalized): Secondary | ICD-10-CM

## 2018-04-07 DIAGNOSIS — G8929 Other chronic pain: Secondary | ICD-10-CM

## 2018-04-07 NOTE — Therapy (Signed)
[facility] [address] [place], [place], [address] Phone: [phone]   Fax:  [phone]  Physical Therapy Treatment  Patient Details  Name: Jacob Warner MRN: [id number] Date of Birth: [birth year] Referring Provider: Magnus Sinning, MD   Encounter Date: 04/07/2018  PT End of Session - 04/07/18 1140    Visit Number  9    Date for PT Re-Evaluation  04/21/18    Authorization Type  [employer]HC Medicare     Authorization Time Period  03/10/18 to 04/21/18    PT Start Time  1057    PT Stop Time  1138    PT Time Calculation (min)  41 min    Activity Tolerance  Patient tolerated treatment well;No increased pain    Behavior During Therapy  WFL for tasks assessed/performed       Past Medical History:  Diagnosis Date  . A-fib ([place])   . Alopecia   . Anemia   . Bradycardia   . Cancer ([place])   . Cherry angioma   . GERD (gastroesophageal reflux disease)   . Glaucoma   . Hypertension   . Pelvic fracture ([place])   . Right BBB/left ant fasc block   . Sick sinus syndrome ([place])   . TIA (transient ischemic attack)   . Xeroderma     Past Surgical History:  Procedure Laterality Date  . APPENDECTOMY    . CERVICAL SPINE SURGERY    . EXPLORATORY LAPAROTOMY    . LITHOTRIPSY    . PACEMAKER PLACEMENT      There were no vitals filed for this visit.  Subjective Assessment - 04/07/18 1057    Subjective  "that dry needling fixed my shoulder."  wants to wrap up next week.    Pertinent History  A-fib, Cervical spine surgery, GERD, TIA, HTN, glaucoma    Patient Stated Goals  decrease pain     Pain Score  0-No pain                       [facility] Adult PT Treatment/Exercise - 04/07/18 1103      Lumbar Exercises: Aerobic   Nustep  L5 x 8 min      Shoulder Exercises: Supine   Horizontal ABduction  Both;20 reps;Theraband    Theraband Level (Shoulder Horizontal ABduction)  Level 3 (Green)    External Rotation  Both;20  reps;Theraband    Theraband Level (Shoulder External Rotation)  Level 3 (Green)    Flexion  Both;20 reps;Theraband    Theraband Level (Shoulder Flexion)  Level 3 (Green)    Other Supine Exercises  cervical retraction x20 reps    Other Supine Exercises  scapular retraction x20 reps      Shoulder Exercises: Standing   Extension  Both;15 reps;Theraband    Theraband Level (Shoulder Extension)  Level 3 (Green)    Row  Both;15 reps;Theraband    Theraband Level (Shoulder Row)  Level 3 (Green)      Shoulder Exercises: Stretch   Other Shoulder Stretches  sidelying book openings 10 x 10 sec bil               PT Short Term Goals - 03/31/18 1220      PT SHORT TERM GOAL #1   Title  Pt will demo consistency and independence with his HEP to improve flexibility, strength and decrease pain.     Time  3    Period  Weeks    Status  Achieved      PT SHORT TERM GOAL #2   Title  Pt will demo improved thoracic rotation to atleast 30 deg each direction to improve his ability to complete daily activity with less restriction.     Time  3    Period  Weeks    Status  Achieved      PT SHORT TERM GOAL #3   Title  Pt will report atleast 20% reduction in pain from the start of therapy to allow him to assist with chores around the home with less difficulty.    Time  3    Period  Weeks    Status  Achieved      PT SHORT TERM GOAL #4   Title  Pt will demo proper set up and use of lumbar roll to increase his posture awareness.     Time  3    Status  Achieved        PT Long Term Goals - 03/10/18 1521      PT LONG TERM GOAL #1   Title  Pt will report taking the initiative to attend the gym for water exercise atleast 2 days a week for improved wellness.     Time  6    Period  Weeks    Status  New    [place] Date  04/21/18      PT LONG TERM GOAL #2   Title  Pt will report improved standing/walking tolerance up to atleast 15 minutes, to allow him to assist with grocery shopping without a break.      Time  6    Period  Weeks    Status  New      PT LONG TERM GOAL #3   Title  Pt will report atleast 40% reduction in pain with good understanding of pain mangement techniques at home.     Time  6    Period  Weeks    Status  New      PT LONG TERM GOAL #4   Title  Pt will demo improved BLE strength to 4/5 MMT which will improve his efficiency with daily activity.    Time  6    Period  Weeks    Status  New      PT LONG TERM GOAL #5   Title  Pt will demo increased hamstring flexibility to lacking no more than 30 deg bilaterally which will improve his sitting and standing posture.     Time  6    Period  Weeks    Status  New            Plan - 04/07/18 1141    Clinical Impression Statement  Pt reports Rt shoulder pain resolved after 2nd session of DN, and anticipate pt will be ready for d/c next week.  Progressing well with PT.    PT Treatment/Interventions  ADLs/Self Care Home Management;Electrical Stimulation;Cryotherapy;Moist Heat;Gait training;Neuromuscular re-education;Patient/family education;Therapeutic activities;Therapeutic exercise;Taping;Dry needling;Passive range of motion    PT Next Visit Plan  plan for d/c, begin looking at goals, continue DN PRN, posture/shoulder strengthening, address LBP and core/hip strengthening PRN    Consulted and Agree with Plan of Care  Patient       Patient will benefit from skilled therapeutic intervention in order to improve the following deficits and impairments:  Decreased activity tolerance, Decreased balance, Difficulty walking, Impaired flexibility, Hypomobility, Decreased strength, Decreased range of motion, Postural dysfunction, Increased muscle spasms, Decreased mobility, Improper body mechanics, Pain  Visit Diagnosis: Chronic bilateral low back pain without sciatica  Pain in thoracic spine  Muscle weakness (generalized)     Problem List Patient Active Problem List   Diagnosis Date Noted  . Post laminectomy syndrome  03/19/2017  . Chronic pain syndrome 03/19/2017  . Chronic bilateral low back pain without sciatica 03/19/2017  . Bradycardia 05/31/2015  . Pacemaker 05/31/2015  . Peripheral edema 05/31/2015  . Heart murmur 05/31/2015  . Sick sinus syndrome ([place]) 05/31/2015  . Nonsustained ventricular tachycardia ([place]) 05/31/2015  . Follicular lymphoma ([place]) 04/02/2015      Laureen Abrahams, PT, DPT 04/07/18 11:42 AM     [facility] [address] [place], [place], [address] Phone: [phone]   Fax:  [phone]  Name: Jacob Warner MRN: [id number] Date of Birth: 08-07-1924

## 2018-04-12 ENCOUNTER — Ambulatory Visit: Payer: Medicare Other | Admitting: Physical Therapy

## 2018-04-12 ENCOUNTER — Encounter: Payer: Self-pay | Admitting: Physical Therapy

## 2018-04-12 DIAGNOSIS — G8929 Other chronic pain: Secondary | ICD-10-CM

## 2018-04-12 DIAGNOSIS — M545 Low back pain, unspecified: Secondary | ICD-10-CM

## 2018-04-12 DIAGNOSIS — M6281 Muscle weakness (generalized): Secondary | ICD-10-CM

## 2018-04-12 DIAGNOSIS — M546 Pain in thoracic spine: Secondary | ICD-10-CM

## 2018-04-12 DIAGNOSIS — R293 Abnormal posture: Secondary | ICD-10-CM

## 2018-04-12 NOTE — Therapy (Signed)
[facility] [address], [place], [place], [address] Phone: [phone]   Fax:  [phone]  Physical Therapy Treatment/Discharge  Patient Details  Name: Jacob Warner MRN: [id number] Date of Birth: 11-03-1924 Referring Provider: Magnus Sinning, MD   Encounter Date: 04/12/2018  PT End of Session - 04/12/18 1126    Visit Number  10    Date for PT Re-Evaluation  04/21/18    Authorization Type  [employer] Medicare     Authorization Time Period  03/10/18 to 04/21/18    PT Start Time  1058 d/c visit    PT Stop Time  1117    PT Time Calculation (min)  19 min    Activity Tolerance  Patient tolerated treatment well;No increased pain    Behavior During Therapy  WFL for tasks assessed/performed       Past Medical History:  Diagnosis Date  . A-fib ([place])   . Alopecia   . Anemia   . Bradycardia   . Cancer ([place])   . Cherry angioma   . GERD (gastroesophageal reflux disease)   . Glaucoma   . Hypertension   . Pelvic fracture ([employer])   . Right BBB/left ant fasc block   . Sick sinus syndrome ([place])   . TIA (transient ischemic attack)   . Xeroderma     Past Surgical History:  Procedure Laterality Date  . APPENDECTOMY    . CERVICAL SPINE SURGERY    . EXPLORATORY LAPAROTOMY    . LITHOTRIPSY    . PACEMAKER PLACEMENT      There were no vitals filed for this visit.  Subjective Assessment - 04/12/18 1101    Subjective  shoulder is feeling fine, "it's just that darn back of mine."  sees MD Thursday and will ask about ablation.  Reports MD told him "not to go in the pool."    How long can you stand comfortably?  4 min    How long can you walk comfortably?  5-10 min    Patient Stated Goals  decrease pain     Pain Score  0-No pain    Pain Location  Shoulder    Pain Orientation  Right         [facility] PT Assessment - 04/12/18 0001      Strength   Right Hip Flexion  5/5    Right Hip Extension  4/5    Right Hip ABduction   3+/5    Left Hip Flexion  5/5    Left Hip Extension  4/5    Left Hip ABduction  3+/5    Right Knee Flexion  4/5    Right Knee Extension  5/5    Left Knee Flexion  4/5    Left Knee Extension  5/5    Right Ankle Dorsiflexion  5/5    Left Ankle Dorsiflexion  5/5      Flexibility   Hamstrings  bil lacking 22 degrees                   [facility] Adult PT Treatment/Exercise - 04/12/18 1103      Lumbar Exercises: Aerobic   Nustep  L5 x 8 min             PT Education - 04/12/18 1125    Education provided  Yes    Education Details  follow up with MD re: back pain, continue HEP for back and shoulder    Person(s) Educated  Patient;Spouse  Methods  Explanation    Comprehension  Verbalized understanding       PT Short Term Goals - 03/31/18 1220      PT SHORT TERM GOAL #1   Title  Pt will demo consistency and independence with his HEP to improve flexibility, strength and decrease pain.     Time  3    Period  Weeks    Status  Achieved      PT SHORT TERM GOAL #2   Title  Pt will demo improved thoracic rotation to atleast 30 deg each direction to improve his ability to complete daily activity with less restriction.     Time  3    Period  Weeks    Status  Achieved      PT SHORT TERM GOAL #3   Title  Pt will report atleast 20% reduction in pain from the start of therapy to allow him to assist with chores around the home with less difficulty.    Time  3    Period  Weeks    Status  Achieved      PT SHORT TERM GOAL #4   Title  Pt will demo proper set up and use of lumbar roll to increase his posture awareness.     Time  3    Status  Achieved        PT Long Term Goals - 04/12/18 1126      PT LONG TERM GOAL #1   Title  Pt will report taking the initiative to attend the gym for water exercise atleast 2 days a week for improved wellness.     Baseline  4/15: per pt MD advised no water exercise    Time  6    Period  Weeks    Status  Deferred      PT LONG TERM  GOAL #2   Title  Pt will report improved standing/walking tolerance up to atleast 15 minutes, to allow him to assist with grocery shopping without a break.     Baseline  4/15: due to no change in back pain    Time  6    Period  Weeks    Status  Not Met      PT LONG TERM GOAL #3   Title  Pt will report atleast 40% reduction in pain with good understanding of pain mangement techniques at home.     Baseline  4/15: no change in back; 99% improved in shoulder    Time  6    Period  Weeks    Status  Partially Met      PT LONG TERM GOAL #4   Title  Pt will demo improved BLE strength to 4/5 MMT which will improve his efficiency with daily activity.    Baseline  4/15: all met except 3+/5 bil hip abduction    Time  6    Period  Weeks    Status  Partially Met      PT LONG TERM GOAL #5   Title  Pt will demo increased hamstring flexibility to lacking no more than 30 deg bilaterally which will improve his sitting and standing posture.     Time  6    Period  Weeks    Status  Achieved            Plan - 04/12/18 1127    Clinical Impression Statement  Pt reports no change in LBP since starting PT, but also states "there's nothing you  can do to fix it" and is compliant with extensive HEP to manage back pain. Pt also referred for Rt shoulder pain which is now resolved.  Overall pt doing well from shoulder standpoint, and with minimal change to LBP.  Pt to see MD on 04/15/18 to discuss possible ablation procdure to help with back pain as PT has been unsuccessful at this time.  Pt agreeable and ready for d/c.    PT Treatment/Interventions  ADLs/Self Care Home Management;Electrical Stimulation;Cryotherapy;Moist Heat;Gait training;Neuromuscular re-education;Patient/family education;Therapeutic activities;Therapeutic exercise;Taping;Dry needling;Passive range of motion    PT Next Visit Plan  d/c PT today    Consulted and Agree with Plan of Care  Patient       Patient will benefit from skilled  therapeutic intervention in order to improve the following deficits and impairments:  Decreased activity tolerance, Decreased balance, Difficulty walking, Impaired flexibility, Hypomobility, Decreased strength, Decreased range of motion, Postural dysfunction, Increased muscle spasms, Decreased mobility, Improper body mechanics, Pain  Visit Diagnosis: Chronic bilateral low back pain without sciatica  Pain in thoracic spine  Muscle weakness (generalized)  Abnormal posture     Problem List Patient Active Problem List   Diagnosis Date Noted  . Post laminectomy syndrome 03/19/2017  . Chronic pain syndrome 03/19/2017  . Chronic bilateral low back pain without sciatica 03/19/2017  . Bradycardia 05/31/2015  . Pacemaker 05/31/2015  . Peripheral edema 05/31/2015  . Heart murmur 05/31/2015  . Sick sinus syndrome ([place]) 05/31/2015  . Nonsustained ventricular tachycardia ([place]) 05/31/2015  . Follicular lymphoma ([place]) 04/02/2015      Laureen Abrahams, PT, DPT 04/12/18 11:31 AM    [facility] [address] [place], [place], [address] Phone: [phone]   Fax:  [phone]  Name: MILBURN FREENEY MRN: [id number] Date of Birth: 03-28-1924      PHYSICAL THERAPY DISCHARGE SUMMARY  Visits from Start of Care: 10  Current functional level related to goals / functional outcomes: See above   Remaining deficits: See above   Education / Equipment: HEP  Plan: Patient agrees to discharge.  Patient goals were partially met. Patient is being discharged due to being pleased with the current functional level.  ?????     Laureen Abrahams, PT, DPT 04/12/18 11:31 AM  [facility] [address] [place], [place] [address]  [phone] (office) [phone] (fax)

## 2018-04-14 ENCOUNTER — Encounter: Payer: Medicare Other | Admitting: Physical Therapy

## 2018-04-15 ENCOUNTER — Ambulatory Visit (INDEPENDENT_AMBULATORY_CARE_PROVIDER_SITE_OTHER): Payer: Medicare Other | Admitting: Physical Medicine and Rehabilitation

## 2018-04-15 ENCOUNTER — Encounter (INDEPENDENT_AMBULATORY_CARE_PROVIDER_SITE_OTHER): Payer: Self-pay | Admitting: Physical Medicine and Rehabilitation

## 2018-04-15 VITALS — BP 125/67 | HR 74 | Temp 97.9°F

## 2018-04-15 DIAGNOSIS — G8929 Other chronic pain: Secondary | ICD-10-CM | POA: Diagnosis not present

## 2018-04-15 DIAGNOSIS — M47816 Spondylosis without myelopathy or radiculopathy, lumbar region: Secondary | ICD-10-CM

## 2018-04-15 DIAGNOSIS — M545 Low back pain, unspecified: Secondary | ICD-10-CM

## 2018-04-15 NOTE — Progress Notes (Signed)
.  Numeric Pain Rating Scale and Functional Assessment Average Pain 8 Pain Right Now 8 My pain is intermittent and sharp Pain is worse with: walking, bending, standing and some activites Pain improves with: heat/ice   In the last MONTH (on 0-10 scale) has pain interfered with the following?  1. General activity like being  able to carry out your everyday physical activities such as walking, climbing stairs, carrying groceries, or moving a chair?  Rating(8)  2. Relation with others like being able to carry out your usual social activities and roles such as  activities at home, at work and in your community. Rating(4)  3. Enjoyment of life such that you have  been bothered by emotional problems such as feeling anxious, depressed or irritable?  Rating(2)

## 2018-04-15 NOTE — Progress Notes (Signed)
Jacob Warner - 84 y.o. male MRN [id number]  Date of birth: 12/06/1924  Office Visit Note: Visit Date: 04/15/2018 PCP: [name], MD Referred by: [name], MD  Subjective: Chief Complaint  Patient presents with  . Lower Back - Pain   HPI: Mr. Stai is a 84 year old gentleman who comes in today with his wife who provides some of the history.  He is very young appearing 84 year old gentleman.  He is a patient of [name] is who referred him to [place] initially for spine injection and ultimately trial of the spinal cord stimulator.  He has since gone on to have indwelling spinal cord stimulator placement with more than 50% relief of his back and hip pain bilaterally.  He unfortunately still has a mechanical back pain complaint worse with going from sit to stand and extension.  He reports that medications in the stimulator just do not seem to quite touch it.  He has recently undergone reevaluation and treatment with physical therapy.  He has had a few bouts of physical therapy.  Last time we saw him he was having some issues with his shoulder and the physical therapist to help that out greatly.  He denies any radicular pain although he does get some symptoms over the greater trochanters.  He says the stimulator controls that very nicely.  Myelogram evidence has shown moderate to severe facet arthropathy bilaterally at L4-5 and L5-S1.  He has some lateral recess stenosis.  He has failed other conservative and non-conservative care.  Prior to seeing [name] and myself he was seeing Dr. Mina Marble at [facility] orthopedics.  We have 60 pages of notes in our old chart detailing his time at their office.  The patient underwent several injections there and medical management without relief until they were able to complete radiofrequency ablation of the L4-5 and L5-S1 facet joints.  This occurred in March and April 2017.  According to those notes the patient got good relief after the procedure.  According to the  patient today he did feel like he got some relief but the procedure itself was more the worst procedures she had ever had in terms of pain.  We have discussed this in the past with [facility] have tried to reassure him that we do the same procedure and typically do not have anybody in that amount of pain.  1 of the issues has been possibly repeating this procedure to see if the mechanical complaints he has could be diminished once again.  Those notes again are in the chart and we may scan those into our new chart just for sake of reference.  He had double diagnostic blocks and the typical workup for facet mediated pain.  He does have pain consistent with facet mediated pain.   Review of Systems  Constitutional: Negative for chills, fever, malaise/fatigue and weight loss.  HENT: Negative for hearing loss and sinus pain.   Eyes: Negative for blurred vision, double vision and photophobia.  Respiratory: Negative for cough and shortness of breath.   Cardiovascular: Negative for chest pain, palpitations and leg swelling.  Gastrointestinal: Negative for abdominal pain, nausea and vomiting.  Genitourinary: Negative for flank pain.  Musculoskeletal: Positive for back pain and joint pain. Negative for myalgias.  Skin: Negative for itching and rash.  Neurological: Negative for tremors, focal weakness and weakness.  Endo/Heme/Allergies: Negative.   Psychiatric/Behavioral: Negative for depression.  All other systems reviewed and are negative.  Otherwise per HPI.  Assessment & Plan: Visit  Diagnoses:  1. Spondylosis without myelopathy or radiculopathy, lumbar region   2. Chronic bilateral low back pain without sciatica     Plan: Findings:  Chronic recalcitrant history of low back pain bilateral and right more than left worse with extension rotation and standing.  Somewhat better overall with spinal cord stimulator particularly for pain over the greater trochanters and thigh.  No radicular complaints.  Imaging  consistent with facet arthropathy.  Prior radiofrequency ablation of the L4-5 and L5-S1 facet joints by Dr. Mina Marble in March or April 2017.  Notes are in the chart and it is documented that he got good relief from the procedure.  I think the next step is to get approval to repeat the procedure and I have assured him that I do not think we will have the same level of pain aspect.  Had the first time he had it done but obviously it could happen and we would just stop the procedure at that point it was not painful.  My experience is after doing probably over 1000 of these that it should not be a real issue.  He does have a pacemaker as well as spinal cord stimulator but these are compatible with doing the radiofrequency ablation particularly in the lumbar area as we will put the grounding cable down the leg.  We should not have an issue with that.    Meds & Orders: No orders of the defined types were placed in this encounter.  No orders of the defined types were placed in this encounter.   Follow-up: Return for L4-5 and L5-S1 facet joint ablation.   Procedures: No procedures performed  No notes on file   Clinical History: Lumbar CT myelogram 04/19/2015 IMPRESSION: 1. Superior endplate fracture at L1 was a vacuum disc at T12-L1. 2. Mild to moderate left subarticular and mild left foraminal stenosis at L1-2. 3. Moderate left and mild right subarticular stenosis at L2-3. There is mild foraminal narrowing at this level bilaterally. 4. Moderate subarticular and mild to moderate foraminal stenosis bilaterally at L3-4. 5. Mild subarticular and moderate foraminal stenosis at L4-5 is worse on the right. 6. Advanced facet hypertrophy and spurring leads to mild to moderate right and mild left foraminal stenosis. The central canal is patent.   He reports that he has never smoked. He has never used smokeless tobacco. No results for input(s): HGBA1C, LABURIC in the last 8760 hours.  Objective:  VS:  HT:     WT:   BMI:     BP:125/67  HR:74bpm  TEMP:97.9 F (36.6 C)(Oral)  RESP:96 % Physical Exam  Constitutional: He is oriented to person, place, and time. He appears well-developed and well-nourished. No distress.  HENT:  Head: Normocephalic and atraumatic.  Eyes: Pupils are equal, round, and reactive to light. Conjunctivae are normal.  Neck: Normal range of motion. Neck supple.  Cardiovascular: Regular rhythm and intact distal pulses.  Pulmonary/Chest: Effort normal. No respiratory distress.  Musculoskeletal:  Patient stands with somewhat of a forward flexed lumbar spine.  He is stiff with extension of the lumbar spine.  Is well-healed small surgical scar for her IPG implantation for spinal cord stimulator.  He has good strength in the lower extremities bilaterally.  No pain with hip rotation.  He does have concordant pain with facet joint loading and extension of the lumbar spine.  Neurological: He is alert and oriented to person, place, and time. He exhibits normal muscle tone.  Skin: Skin is warm and dry. No rash  noted. No erythema.  Psychiatric: He has a normal mood and affect.  Nursing note and vitals reviewed.   Ortho Exam Imaging: No results found.  Past Medical/Family/Surgical/Social History: Medications & Allergies reviewed per EMR, new medications updated. Patient Active Problem List   Diagnosis Date Noted  . Post laminectomy syndrome 03/19/2017  . Chronic pain syndrome 03/19/2017  . Chronic bilateral low back pain without sciatica 03/19/2017  . Bradycardia 05/31/2015  . Pacemaker 05/31/2015  . Peripheral edema 05/31/2015  . Heart murmur 05/31/2015  . Sick sinus syndrome ([place]) 05/31/2015  . Nonsustained ventricular tachycardia ([place]) 05/31/2015  . 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 A-fib ([place])   . Alopecia   . Anemia   . Bradycardia   . Cancer ([place])   . Cherry angioma   . GERD (gastroesophageal reflux disease)   .  Glaucoma   . Hypertension   . Pelvic fracture ([place])   . Right BBB/left ant fasc block   . Sick sinus syndrome ([place])   . TIA (transient ischemic attack)   . Xeroderma    Family History  Problem Relation Age of Onset  . Brain cancer Sister    Past Surgical History:  Procedure Laterality Date  . APPENDECTOMY    . CERVICAL SPINE SURGERY    . EXPLORATORY LAPAROTOMY    . LITHOTRIPSY    . PACEMAKER PLACEMENT     Social History   Occupational History  . Not on file  Tobacco Use  . Smoking status: Never Smoker  . Smokeless tobacco: Never Used  Substance and Sexual Activity  . Alcohol use: No    Alcohol/week: 0.0 oz  . Drug use: No  . Sexual activity: Not on file

## 2018-04-19 ENCOUNTER — Ambulatory Visit: Payer: Medicare Other | Admitting: Physical Therapy

## 2018-04-21 ENCOUNTER — Ambulatory Visit: Payer: Medicare Other | Admitting: Physical Therapy

## 2018-04-25 ENCOUNTER — Other Ambulatory Visit (INDEPENDENT_AMBULATORY_CARE_PROVIDER_SITE_OTHER): Payer: Self-pay | Admitting: Physical Medicine and Rehabilitation

## 2018-04-26 NOTE — Telephone Encounter (Signed)
Please advise 

## 2018-05-05 ENCOUNTER — Telehealth (INDEPENDENT_AMBULATORY_CARE_PROVIDER_SITE_OTHER): Payer: Self-pay | Admitting: *Deleted

## 2018-05-05 NOTE — Telephone Encounter (Signed)
Called pt to schedule Bil L4-5 L5-S1 RFA and left vm #1.

## 2018-05-05 NOTE — Telephone Encounter (Signed)
[employer] does not require auth for RFA. Decision ID #:[id number]

## 2018-05-14 ENCOUNTER — Ambulatory Visit (INDEPENDENT_AMBULATORY_CARE_PROVIDER_SITE_OTHER): Payer: Medicare Other | Admitting: Cardiology

## 2018-05-14 ENCOUNTER — Encounter (INDEPENDENT_AMBULATORY_CARE_PROVIDER_SITE_OTHER): Payer: Self-pay

## 2018-05-14 ENCOUNTER — Encounter: Payer: Self-pay | Admitting: Cardiology

## 2018-05-14 VITALS — BP 104/62 | HR 77 | Ht 70.0 in | Wt 210.0 lb

## 2018-05-14 DIAGNOSIS — I5033 Acute on chronic diastolic (congestive) heart failure: Secondary | ICD-10-CM | POA: Diagnosis not present

## 2018-05-14 DIAGNOSIS — Z950 Presence of cardiac pacemaker: Secondary | ICD-10-CM | POA: Diagnosis not present

## 2018-05-14 DIAGNOSIS — I480 Paroxysmal atrial fibrillation: Secondary | ICD-10-CM

## 2018-05-14 DIAGNOSIS — I495 Sick sinus syndrome: Secondary | ICD-10-CM

## 2018-05-14 LAB — CBC WITH DIFFERENTIAL/PLATELET
Basophils Absolute: 0 10*3/uL (ref 0.0–0.2)
Basos: 0 %
EOS (ABSOLUTE): 0.2 10*3/uL (ref 0.0–0.4)
Eos: 4 %
Hematocrit: 31.2 % — ABNORMAL LOW (ref 37.5–51.0)
Hemoglobin: 10.8 g/dL — ABNORMAL LOW (ref 13.0–17.7)
Immature Grans (Abs): 0 10*3/uL (ref 0.0–0.1)
Immature Granulocytes: 0 %
Lymphocytes Absolute: 1 10*3/uL (ref 0.7–3.1)
Lymphs: 21 %
MCH: 33.2 pg — ABNORMAL HIGH (ref 26.6–33.0)
MCHC: 34.6 g/dL (ref 31.5–35.7)
MCV: 96 fL (ref 79–97)
Monocytes Absolute: 0.5 10*3/uL (ref 0.1–0.9)
Monocytes: 10 %
Neutrophils Absolute: 3.1 10*3/uL (ref 1.4–7.0)
Neutrophils: 65 %
Platelets: 167 10*3/uL (ref 150–379)
RBC: 3.25 x10E6/uL — ABNORMAL LOW (ref 4.14–5.80)
RDW: 13.6 % (ref 12.3–15.4)
WBC: 4.7 10*3/uL (ref 3.4–10.8)

## 2018-05-14 LAB — COMPREHENSIVE METABOLIC PANEL
ALT: 17 IU/L (ref 0–44)
AST: 16 IU/L (ref 0–40)
Albumin/Globulin Ratio: 2.4 — ABNORMAL HIGH (ref 1.2–2.2)
Albumin: 4.3 g/dL (ref 3.2–4.6)
Alkaline Phosphatase: 38 IU/L — ABNORMAL LOW (ref 39–117)
BUN/Creatinine Ratio: 18 (ref 10–24)
BUN: 24 mg/dL (ref 10–36)
Bilirubin Total: <0.2 mg/dL (ref 0.0–1.2)
CO2: 24 mmol/L (ref 20–29)
Calcium: 9.2 mg/dL (ref 8.6–10.2)
Chloride: 104 mmol/L (ref 96–106)
Creatinine, Ser: 1.33 mg/dL — ABNORMAL HIGH (ref 0.76–1.27)
GFR calc Af Amer: 54 mL/min/{1.73_m2} — ABNORMAL LOW (ref >59)
GFR calc non Af Amer: 46 mL/min/{1.73_m2} — ABNORMAL LOW (ref >59)
Globulin, Total: 1.8 g/dL (ref 1.5–4.5)
Glucose: 96 mg/dL (ref 65–99)
Potassium: 4.7 mmol/L (ref 3.5–5.2)
Sodium: 142 mmol/L (ref 134–144)
Total Protein: 6.1 g/dL (ref 6.0–8.5)

## 2018-05-14 LAB — TSH: TSH: 2.63 u[IU]/mL (ref 0.450–4.500)

## 2018-05-14 MED ORDER — FUROSEMIDE 20 MG PO TABS: ORAL_TABLET | ORAL | 0 refills | Status: DC

## 2018-05-14 NOTE — Patient Instructions (Signed)
Medication Instructions:   TAKE LASIX 40 MG BY MOUTH DAILY FOR 7 DAYS ONLY, THEN TAKE LASIX 20 MG BY MOUTH DAILY THEREAFTER    Labwork:  TODAY--CMET, CBC W DIFF, AND TSH     Follow-Up:  Your physician wants you to follow-up in: [place] will receive a reminder letter in the mail two months in advance. If you don't receive a letter, please call our office to schedule the follow-up appointment.        If you need a refill on your cardiac medications before your next appointment, please call your pharmacy.

## 2018-05-14 NOTE — Progress Notes (Addendum)
05/14/2018 Jacob Warner   08/16/1924  [id number]  Primary Physician [name], MD Primary Cardiologist: Dr. Meda Coffee  Electrophysiologist: Dr. Lovena Le   Reason for Visit/CC: 6 month f/u for SSS and PAF  HPI:  Jacob Warner is a 84 y.o. male who is being seen today for routine f/u. He established care with our practice just a few years ago. He moved to [place] from [place], [place], in 2016 to live closer to his 2 sons. He was seen initially by Dr. Mare Ferrari but transitioned to Dr. Meda Coffee following his retirement.   His cardiac history is significant for SSS. Per records, he presented to his PCP in 2013 with a heart rate of 39. He was symptomatic with his bradycardia. On 09/23/12 he underwent implantation of a Catering manager at [employer]. The site of implantation is in the right upper chest because he has the remnants of a previous Port-A-Cath in the left upper chest. The patient has a past history of having had nodular lymphoma in 2008. This was treated successfully with chemotherapy. He has been in remission. His oncologist here in [place] is Dr. Julien Nordmann. He has no documented history of IHD, nor HF. Also no history of HTN or DM. There has also been concern for PAF, thus he has been treated with low dose Amiodarone. He is on ASA only. He has a h/o falls thus not on a/c. In addition to Dr. Meda Coffee, he has also established care with Dr. Lovena Le in [place] clinic for his PPM.   In addition, he has a prior h/o PE and TIA that occurred after undergoing back surgery several years ago. His TIA resulted in some memory deficit.    05/14/2018  -patient is coming after 6 months, who felt that he is doing well he is limited in his activities by his back pain, but can still get around slowly with use of cane.  He continues to use low-dose of amiodarone, denies any worsening shortness of breath no chest pain no palpitation no dizziness syncope or falls.  No discharge from pacemaker  insertion site.    Current Meds  Medication Sig  . acetaminophen (TYLENOL) 500 MG tablet Take 1,000 mg by mouth every 6 (six) hours as needed (pain).   Marland Kitchen amiodarone (PACERONE) 200 MG tablet TAKE 1 TABLET BY MOUTH EVERY DAY  . aspirin 81 MG tablet Take 81 mg by mouth daily.  . baclofen (LIORESAL) 10 MG tablet TAKE 1 TABLET BY MOUTH AT NIGHT.  Marland Kitchen Cyanocobalamin (VITAMIN B-12 PO) Take 1 tablet by mouth daily.  Marland Kitchen doxazosin (CARDURA) 4 MG tablet Take 4 mg by mouth at bedtime.  . dutasteride (AVODART) 0.5 MG capsule Take 0.5 mg by mouth daily.  Marland Kitchen gabapentin (NEURONTIN) 300 MG capsule Take 300 mg by mouth every morning.   . lactulose (CHRONULAC) 10 GM/15ML solution Take 10 g by mouth 2 (two) times daily as needed for mild constipation.   Marland Kitchen latanoprost (XALATAN) 0.005 % ophthalmic solution PLACE 1 DROP INTO BOTH EYES AT BEDTIME  . Magnesium Oxide 400 (240 Mg) MG TABS Take 1 tablet by mouth 2 (two) times daily.  . RESTASIS 0.05 % ophthalmic emulsion PLACE 1 DROP INTO BOTH EYES 2 TIMES DAILY.  . traZODone (DESYREL) 50 MG tablet TAKE 1 TO 2 TABLETS AT BEDTIME AS NEEDED FOR INSOMNIA  . Vitamin D, Ergocalciferol, (DRISDOL) 50000 units CAPS capsule Take 1 capsule by mouth once a week. Takes on Tuesdays   Allergies  Allergen Reactions  .  Sulfa Antibiotics Hives and Rash    Bactrim  . Sulfacetamide Sodium Rash and Hives    Bactrim   Past Medical History:  Diagnosis Date  . A-fib ([place])   . Alopecia   . Anemia   . Bradycardia   . Cancer ([place])   . Cherry angioma   . GERD (gastroesophageal reflux disease)   . Glaucoma   . Hypertension   . Pelvic fracture ([place])   . Right BBB/left ant fasc block   . Sick sinus syndrome ([place])   . TIA (transient ischemic attack)   . Xeroderma    Family History  Problem Relation Age of Onset  . Brain cancer Sister    Past Surgical History:  Procedure Laterality Date  . APPENDECTOMY    . CERVICAL SPINE SURGERY    . EXPLORATORY LAPAROTOMY    . LITHOTRIPSY      . PACEMAKER PLACEMENT     Social History   Socioeconomic History  . Marital status: Married    Spouse name: Not on file  . Number of children: Not on file  . Years of education: Not on file  . Highest education level: Not on file  Occupational History  . Not on file  Social Needs  . Financial resource strain: Not on file  . Food insecurity:    Worry: Not on file    Inability: Not on file  . Transportation needs:    Medical: Not on file    Non-medical: Not on file  Tobacco Use  . Smoking status: Never Smoker  . Smokeless tobacco: Never Used  Substance and Sexual Activity  . Alcohol use: No    Alcohol/week: 0.0 oz  . Drug use: No  . Sexual activity: Not on file  Lifestyle  . Physical activity:    Days per week: Not on file    Minutes per session: Not on file  . Stress: Not on file  Relationships  . Social connections:    Talks on phone: Not on file    Gets together: Not on file    Attends religious service: Not on file    Active member of club or organization: Not on file    Attends meetings of clubs or organizations: Not on file    Relationship status: Not on file  . Intimate partner violence:    Fear of current or ex partner: Not on file    Emotionally abused: Not on file    Physically abused: Not on file    Forced sexual activity: Not on file  Other Topics Concern  . Not on file  Social History Narrative  . Not on file     Review of Systems: General: negative for chills, fever, night sweats or weight changes.  Cardiovascular: negative for chest pain, dyspnea on exertion, edema, orthopnea, palpitations, paroxysmal nocturnal dyspnea or shortness of breath Dermatological: negative for rash Respiratory: negative for cough or wheezing Urologic: negative for hematuria Abdominal: negative for nausea, vomiting, diarrhea, bright red blood per rectum, melena, or hematemesis Neurologic: negative for visual changes, syncope, or dizziness All other systems reviewed  and are otherwise negative except as noted above.   Physical Exam:  Blood pressure 104/62, pulse 77, height 5\' 10"  (1.778 m), weight 210 lb (95.3 kg), SpO2 94 %.  General appearance: alert, cooperative and no distress Neck: no carotid bruit and no JVD Lungs: clear to auscultation bilaterally Heart: regular rate and rhythm, S1, S2 normal, no murmur, click, rub or gallop Extremities: mild bilateral  LEE Pulses: 2+ and symmetric Skin: Skin color, texture, turgor normal. No rashes or lesions Neurologic: Grossly normal  EKG a sensed, V paced rhythm with left bundle branch block, unchanged from prior - personally reviewed    ASSESSMENT AND PLAN:   1. SSS: s/p [employer] PPM implant in 2013 at [facility]. Now followed by Dr. Lovena Le.  Functioning well.  2. PAF: maintaining NSR on low dose amiodarone. No dyspnea or visual changes. Lung exam normal. He is followed regularly by an eye doctor.  TSH today 2.6, normal pulmonary function test.  Continue ASA. He is not on A/C given fall risk.  f/u with Dr. Meda Coffee in 6 months.    Ena Dawley PA-C, [facility] 05/14/2018 10:34 AM

## 2018-06-03 ENCOUNTER — Ambulatory Visit (INDEPENDENT_AMBULATORY_CARE_PROVIDER_SITE_OTHER): Payer: Medicare Other | Admitting: Physical Medicine and Rehabilitation

## 2018-06-03 ENCOUNTER — Encounter

## 2018-06-03 ENCOUNTER — Encounter (INDEPENDENT_AMBULATORY_CARE_PROVIDER_SITE_OTHER): Payer: Self-pay | Admitting: Physical Medicine and Rehabilitation

## 2018-06-03 ENCOUNTER — Ambulatory Visit (INDEPENDENT_AMBULATORY_CARE_PROVIDER_SITE_OTHER): Payer: Self-pay

## 2018-06-03 VITALS — BP 127/73 | HR 70

## 2018-06-03 DIAGNOSIS — M47816 Spondylosis without myelopathy or radiculopathy, lumbar region: Secondary | ICD-10-CM

## 2018-06-03 DIAGNOSIS — M545 Low back pain, unspecified: Secondary | ICD-10-CM

## 2018-06-03 DIAGNOSIS — G8929 Other chronic pain: Secondary | ICD-10-CM

## 2018-06-03 MED ORDER — METHYLPREDNISOLONE ACETATE 80 MG/ML IJ SUSP
80.0000 mg | Freq: Once | INTRAMUSCULAR | Status: AC
  Administered 2018-06-03: 80 mg

## 2018-06-03 NOTE — Progress Notes (Signed)
Pt states pain in lower back that radiates into the right leg. Pt states pain is there all the time, nothing eases pain.   .Numeric Pain Rating Scale and Functional Assessment Average Pain 6   In the last MONTH (on 0-10 scale) has pain interfered with the following?  1. General activity like being  able to carry out your everyday physical activities such as walking, climbing stairs, carrying groceries, or moving a chair?  Rating(4)   +Driver, -BT, -Dye Allergies.

## 2018-06-03 NOTE — Patient Instructions (Signed)

## 2018-06-10 ENCOUNTER — Ambulatory Visit (INDEPENDENT_AMBULATORY_CARE_PROVIDER_SITE_OTHER): Payer: Medicare Other

## 2018-06-10 ENCOUNTER — Ambulatory Visit (INDEPENDENT_AMBULATORY_CARE_PROVIDER_SITE_OTHER): Payer: Medicare Other | Admitting: Physical Medicine and Rehabilitation

## 2018-06-10 ENCOUNTER — Encounter (INDEPENDENT_AMBULATORY_CARE_PROVIDER_SITE_OTHER): Payer: Self-pay | Admitting: Physical Medicine and Rehabilitation

## 2018-06-10 VITALS — BP 121/58 | HR 79

## 2018-06-10 DIAGNOSIS — M47816 Spondylosis without myelopathy or radiculopathy, lumbar region: Secondary | ICD-10-CM | POA: Diagnosis not present

## 2018-06-10 DIAGNOSIS — M545 Low back pain, unspecified: Secondary | ICD-10-CM

## 2018-06-10 DIAGNOSIS — G894 Chronic pain syndrome: Secondary | ICD-10-CM

## 2018-06-10 DIAGNOSIS — G8929 Other chronic pain: Secondary | ICD-10-CM

## 2018-06-10 MED ORDER — METHYLPREDNISOLONE ACETATE 80 MG/ML IJ SUSP
80.0000 mg | Freq: Once | INTRAMUSCULAR | Status: DC
Start: 2018-06-10 — End: 2020-11-25

## 2018-06-10 NOTE — Progress Notes (Signed)
 .  Numeric Pain Rating Scale and Functional Assessment Average Pain 7   In the last MONTH (on 0-10 scale) has pain interfered with the following?  1. General activity like being  able to carry out your everyday physical activities such as walking, climbing stairs, carrying groceries, or moving a chair?  Rating(4)   +Driver, -BT, -Dye Allergies.  

## 2018-06-11 NOTE — Procedures (Signed)
Lumbar Facet Joint Nerve Denervation  Patient: Jacob Warner      Date of Birth: 1924-02-23 MRN: [id number] PCP: [name], MD      Visit Date: 06/10/2018   Universal Protocol:    Date/Time: 06/14/196:26 AM  Consent Given By: the patient  Position: PRONE  Additional Comments: Vital signs were monitored before and after the procedure. Patient was prepped and draped in the usual sterile fashion. The correct patient, procedure, and site was verified.   Injection Procedure Details:  Procedure Site One Meds Administered:  Meds ordered this encounter  Medications  . methylPREDNISolone acetate (DEPO-MEDROL) injection 80 mg     Laterality: Left  Location/Site: Left L3 and L4 medial branches and L5 dorsal rami L4-L5 L5-S1  Needle size: 18 G  Needle type: Radiofrequency cannula  Needle Placement: Along juncture of superior articular process and transverse pocess  Findings:  -Comments:  Procedure Details: For each desired target nerve, the corresponding transverse process (sacral ala for the L5 dorsal rami) was identified and the fluoroscope was positioned to square off the endplates of the corresponding vertebral body to achieve a true AP midline view.  The beam was then obliqued 15 to 20 degrees and caudally tilted 15 to 20 degrees to line up a trajectory along the target nerves. The skin over the target of the junction of superior articulating process and transverse process (sacral ala for the L5 dorsal rami) was infiltrated with 79ml of 1% Lidocaine without Epinephrine.  The 18 gauge 15mm active tip outer cannula was advanced in trajectory view to the target.  This procedure was repeated for each target nerve.  Then, for all levels, the outer cannula placement was fine-tuned and the position was then confirmed with bi-planar imaging.    Test stimulation was done both at sensory and motor levels to ensure there was no radicular stimulation. The target tissues were then  infiltrated with 1 ml of 1% Lidocaine without Epinephrine. Subsequently, a percutaneous neurotomy was carried out for 60 seconds at 80 degrees Celsius. The procedure was repeated with the cannula rotated 90 degrees, for duration of 60 seconds, one additional time at each level for a total of two lesions per level.  After the completion of the two lesions, 1 ml of injectate was delivered. It was then repeated for each facet joint nerve mentioned above. Appropriate radiographs were obtained to verify the probe placement during the neurotomy.   Additional Comments:  The patient tolerated the procedure well Dressing: Band-Aid    Post-procedure details: Patient was observed during the procedure. Post-procedure instructions were reviewed.  Patient left the clinic in stable condition.

## 2018-06-11 NOTE — Progress Notes (Signed)
Jacob Warner - 84 y.o. male MRN [id number]  Date of birth: 1924-05-14  Office Visit Note: Visit Date: 06/10/2018 PCP: Jacob Small, MD Referred by: Jacob Small, MD  Subjective: Chief Complaint  Patient presents with  . Lower Back - Pain   HPI: Jacob Warner is a 84 year old gentleman with history of chronic low back pain and hip and leg pain which has been recalcitrant to most treatments.  He denies any real radicular complaints he does have a pacemaker as well as spinal cord stimulator.  He has had prior radiofrequency ablation of the L4-5 and L5-S1 facet joints by Jacob Warner  at [place].  He reports this procedure was very painful but did help at that time.  We repeated this procedure on the right side 2 weeks ago and the patient does feel like he is starting to get quite a bit of relief from that already.  We are going to do the left side today.  This is at L4-5 and L5-S1 and this is a repeat radiofrequency ablation with the patient did get good relief from the procedure.  Please see our prior evaluation and management notes for further details and justification.   ROS Otherwise per HPI.  Assessment & Plan: Visit Diagnoses:  1. Spondylosis without myelopathy or radiculopathy, lumbar region   2. Chronic bilateral low back pain without sciatica   3. Chronic pain syndrome     Plan: No additional findings.   Meds & Orders:  Meds ordered this encounter  Medications  . methylPREDNISolone acetate (DEPO-MEDROL) injection 80 mg    Orders Placed This Encounter  Procedures  . Radiofrequency,Lumbar  . XR C-ARM NO REPORT    Follow-up: Return if symptoms worsen or fail to improve.   Procedures: No procedures performed  Lumbar Facet Joint Nerve Denervation  Patient: Jacob Warner      Date of Birth: 1923/12/31 MRN: [id number] PCP: Jacob Small, MD      Visit Date: 06/10/2018   Universal Protocol:    Date/Time: 06/14/196:26 AM  Consent Given By: the  patient  Position: PRONE  Additional Comments: Vital signs were monitored before and after the procedure. Patient was prepped and draped in the usual sterile fashion. The correct patient, procedure, and site was verified.   Injection Procedure Details:  Procedure Site One Meds Administered:  Meds ordered this encounter  Medications  . methylPREDNISolone acetate (DEPO-MEDROL) injection 80 mg     Laterality: Left  Location/Site: Left L3 and L4 medial branches and L5 dorsal rami L4-L5 L5-S1  Needle size: 18 G  Needle type: Radiofrequency cannula  Needle Placement: Along juncture of superior articular process and transverse pocess  Findings:  -Comments:  Procedure Details: For each desired target nerve, the corresponding transverse process (sacral ala for the L5 dorsal rami) was identified and the fluoroscope was positioned to square off the endplates of the corresponding vertebral body to achieve a true AP midline view.  The beam was then obliqued 15 to 20 degrees and caudally tilted 15 to 20 degrees to line up a trajectory along the target nerves. The skin over the target of the junction of superior articulating process and transverse process (sacral ala for the L5 dorsal rami) was infiltrated with 56ml of 1% Lidocaine without Epinephrine.  The 18 gauge 39mm active tip outer cannula was advanced in trajectory view to the target.  This procedure was repeated for each target nerve.  Then, for all levels, the outer cannula placement was fine-tuned and  the position was then confirmed with bi-planar imaging.    Test stimulation was done both at sensory and motor levels to ensure there was no radicular stimulation. The target tissues were then infiltrated with 1 ml of 1% Lidocaine without Epinephrine. Subsequently, a percutaneous neurotomy was carried out for 60 seconds at 80 degrees Celsius. The procedure was repeated with the cannula rotated 90 degrees, for duration of 60 seconds, one  additional time at each level for a total of two lesions per level.  After the completion of the two lesions, 1 ml of injectate was delivered. It was then repeated for each facet joint nerve mentioned above. Appropriate radiographs were obtained to verify the probe placement during the neurotomy.   Additional Comments:  The patient tolerated the procedure well Dressing: Band-Aid    Post-procedure details: Patient was observed during the procedure. Post-procedure instructions were reviewed.  Patient left the clinic in stable condition.      Clinical History: Lumbar CT myelogram 04/19/2015 IMPRESSION: 1. Superior endplate fracture at L1 was a vacuum disc at T12-L1. 2. Mild to moderate left subarticular and mild left foraminal stenosis at L1-2. 3. Moderate left and mild right subarticular stenosis at L2-3. There is mild foraminal narrowing at this level bilaterally. 4. Moderate subarticular and mild to moderate foraminal stenosis bilaterally at L3-4. 5. Mild subarticular and moderate foraminal stenosis at L4-5 is worse on the right. 6. Advanced facet hypertrophy and spurring leads to mild to moderate right and mild left foraminal stenosis. The central canal is patent.   He reports that he has never smoked. He has never used smokeless tobacco. No results for input(s): HGBA1C, LABURIC in the last 8760 hours.  Objective:  VS:  HT:    WT:   BMI:     BP:(!) 121/58  HR:79bpm  TEMP: ( )  RESP:  Physical Exam  Ortho Exam Imaging: Xr C-arm No Report  Result Date: 06/10/2018 Please see Notes or Procedures tab for imaging impression.   Past Medical/Family/Surgical/Social History: Medications & Allergies reviewed per EMR, new medications updated. Patient Active Problem List   Diagnosis Date Noted  . Post laminectomy syndrome 03/19/2017  . Chronic pain syndrome 03/19/2017  . Chronic bilateral low back pain without sciatica 03/19/2017  . Bradycardia 05/31/2015  . Pacemaker  05/31/2015  . Peripheral edema 05/31/2015  . Heart murmur 05/31/2015  . Sick sinus syndrome ([place]) 05/31/2015  . Nonsustained ventricular tachycardia ([place]) 05/31/2015  . 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 A-fib ([place])   . Alopecia   . Anemia   . Bradycardia   . Cancer ([place])   . Cherry angioma   . GERD (gastroesophageal reflux disease)   . Glaucoma   . Hypertension   . Pelvic fracture ([place])   . Right BBB/left ant fasc block   . Sick sinus syndrome ([place])   . TIA (transient ischemic attack)   . Xeroderma    Family History  Problem Relation Age of Onset  . Brain cancer Sister    Past Surgical History:  Procedure Laterality Date  . APPENDECTOMY    . CERVICAL SPINE SURGERY    . EXPLORATORY LAPAROTOMY    . LITHOTRIPSY    . PACEMAKER PLACEMENT     Social History   Occupational History  . Not on file  Tobacco Use  . Smoking status: Never Smoker  . Smokeless tobacco: Never Used  Substance and Sexual Activity  . Alcohol use: No  Alcohol/week: 0.0 oz  . Drug use: No  . Sexual activity: Not on file

## 2018-06-11 NOTE — Patient Instructions (Signed)

## 2018-06-14 NOTE — Progress Notes (Signed)
PRESS [place] - 84 y.o. male MRN [id number]  Date of birth: 06-26-1924  Office Visit Note: Visit Date: 06/03/2018 PCP: [name], MD Referred by: [name], MD  Subjective: Chief Complaint  Patient presents with  . Lower Back - Pain  . Right Leg - Pain   HPI: Jacob Warner is a 84 year old gentleman with history of chronic low back pain and hip and leg pain which has been recalcitrant to most treatments.  He denies any real radicular complaints he does have a pacemaker as well as spinal cord stimulator.  He has had prior radiofrequency ablation of the L4-5 and L5-S1 facet joints by Dr. Mina Marble  at [place].  He reports this procedure was very painful but did help at that time.  Because he did not get relief with that procedure and his right side is more painful recently we are going to start with a right sided ablation which again is a repeat.  This is at L4-5 and L5-S1 and this is a repeat radiofrequency ablation with the patient did get good relief from the procedure.  Please see our prior evaluation and management notes for further details and justification.   ROS Otherwise per HPI.  Assessment & Plan: Visit Diagnoses:  1. Spondylosis without myelopathy or radiculopathy, lumbar region   2. Chronic bilateral low back pain without sciatica     Plan: No additional findings.   Meds & Orders:  Meds ordered this encounter  Medications  . methylPREDNISolone acetate (DEPO-MEDROL) injection 80 mg    Orders Placed This Encounter  Procedures  . Radiofrequency,Lumbar  . XR C-ARM NO REPORT    Follow-up: Return if symptoms worsen or fail to improve.   Procedures: No procedures performed  Lumbar Facet Joint Nerve Denervation  Patient: Jacob Warner      Date of Birth: 09-Oct-1924 MRN: [id number] PCP: [name], MD      Visit Date: 06/03/2018   Universal Protocol:    Date/Time: 06/17/196:41 AM  Consent Given By: the patient  Position: PRONE  Additional  Comments: Vital signs were monitored before and after the procedure. Patient was prepped and draped in the usual sterile fashion. The correct patient, procedure, and site was verified.   Injection Procedure Details:  Procedure Site One Meds Administered:  Meds ordered this encounter  Medications  . methylPREDNISolone acetate (DEPO-MEDROL) injection 80 mg     Laterality: Right  Location/Site:  L4-L5 L5-S1  Needle size: 18 G  Needle type: Radiofrequency cannula  Needle Placement: Along juncture of superior articular process and transverse pocess  Findings:  -Comments:  Procedure Details: For each desired target nerve, the corresponding transverse process (sacral ala for the L5 dorsal rami) was identified and the fluoroscope was positioned to square off the endplates of the corresponding vertebral body to achieve a true AP midline view.  The beam was then obliqued 15 to 20 degrees and caudally tilted 15 to 20 degrees to line up a trajectory along the target nerves. The skin over the target of the junction of superior articulating process and transverse process (sacral ala for the L5 dorsal rami) was infiltrated with 30ml of 1% Lidocaine without Epinephrine.  The 18 gauge 58mm active tip outer cannula was advanced in trajectory view to the target.  This procedure was repeated for each target nerve.  Then, for all levels, the outer cannula placement was fine-tuned and the position was then confirmed with bi-planar imaging.    Test stimulation was done both at  sensory and motor levels to ensure there was no radicular stimulation. The target tissues were then infiltrated with 1 ml of 1% Lidocaine without Epinephrine. Subsequently, a percutaneous neurotomy was carried out for 60 seconds at 80 degrees Celsius. The procedure was repeated with the cannula rotated 90 degrees, for duration of 60 seconds, one additional time at each level for a total of two lesions per level.  After the completion  of the two lesions, 1 ml of injectate was delivered. It was then repeated for each facet joint nerve mentioned above. Appropriate radiographs were obtained to verify the probe placement during the neurotomy.   Additional Comments:  The patient tolerated the procedure well Dressing: Band-Aid    Post-procedure details: Patient was observed during the procedure. Post-procedure instructions were reviewed.  Patient left the clinic in stable condition.      Clinical History: Lumbar CT myelogram 04/19/2015 IMPRESSION: 1. Superior endplate fracture at L1 was a vacuum disc at T12-L1. 2. Mild to moderate left subarticular and mild left foraminal stenosis at L1-2. 3. Moderate left and mild right subarticular stenosis at L2-3. There is mild foraminal narrowing at this level bilaterally. 4. Moderate subarticular and mild to moderate foraminal stenosis bilaterally at L3-4. 5. Mild subarticular and moderate foraminal stenosis at L4-5 is worse on the right. 6. Advanced facet hypertrophy and spurring leads to mild to moderate right and mild left foraminal stenosis. The central canal is patent.   He reports that he has never smoked. He has never used smokeless tobacco. No results for input(s): HGBA1C, LABURIC in the last 8760 hours.  Objective:  VS:  HT:    WT:   BMI:     BP:127/73  HR:70bpm  TEMP: ( )  RESP:  Physical Exam  Ortho Exam Imaging: No results found.  Past Medical/Family/Surgical/Social History: Medications & Allergies reviewed per EMR, new medications updated. Patient Active Problem List   Diagnosis Date Noted  . Post laminectomy syndrome 03/19/2017  . Chronic pain syndrome 03/19/2017  . Chronic bilateral low back pain without sciatica 03/19/2017  . Bradycardia 05/31/2015  . Pacemaker 05/31/2015  . Peripheral edema 05/31/2015  . Heart murmur 05/31/2015  . Sick sinus syndrome ([place]) 05/31/2015  . Nonsustained ventricular tachycardia ([place]) 05/31/2015  . Follicular  lymphoma ([place]) 04/02/2015   Past Medical History:  Diagnosis Date  . A-fib ([place])   . Alopecia   . Anemia   . Bradycardia   . Cancer ([place])   . Cherry angioma   . GERD (gastroesophageal reflux disease)   . Glaucoma   . Hypertension   . Pelvic fracture ([place])   . Right BBB/left ant fasc block   . Sick sinus syndrome ([place])   . TIA (transient ischemic attack)   . Xeroderma    Family History  Problem Relation Age of Onset  . Brain cancer Sister    Past Surgical History:  Procedure Laterality Date  . APPENDECTOMY    . CERVICAL SPINE SURGERY    . EXPLORATORY LAPAROTOMY    . LITHOTRIPSY    . PACEMAKER PLACEMENT     Social History   Occupational History  . Not on file  Tobacco Use  . Smoking status: Never Smoker  . Smokeless tobacco: Never Used  Substance and Sexual Activity  . Alcohol use: No    Alcohol/week: 0.0 oz  . Drug use: No  . Sexual activity: Not on file

## 2018-06-14 NOTE — Procedures (Signed)
Lumbar Facet Joint Nerve Denervation  Patient: Jacob Warner      Date of Birth: 04-May-1924 MRN: [id number] PCP: [name], MD      Visit Date: 06/03/2018   Universal Protocol:    Date/Time: 06/17/196:41 AM  Consent Given By: the patient  Position: PRONE  Additional Comments: Vital signs were monitored before and after the procedure. Patient was prepped and draped in the usual sterile fashion. The correct patient, procedure, and site was verified.   Injection Procedure Details:  Procedure Site One Meds Administered:  Meds ordered this encounter  Medications  . methylPREDNISolone acetate (DEPO-MEDROL) injection 80 mg     Laterality: Right  Location/Site:  L4-L5 L5-S1  Needle size: 18 G  Needle type: Radiofrequency cannula  Needle Placement: Along juncture of superior articular process and transverse pocess  Findings:  -Comments:  Procedure Details: For each desired target nerve, the corresponding transverse process (sacral ala for the L5 dorsal rami) was identified and the fluoroscope was positioned to square off the endplates of the corresponding vertebral body to achieve a true AP midline view.  The beam was then obliqued 15 to 20 degrees and caudally tilted 15 to 20 degrees to line up a trajectory along the target nerves. The skin over the target of the junction of superior articulating process and transverse process (sacral ala for the L5 dorsal rami) was infiltrated with 69ml of 1% Lidocaine without Epinephrine.  The 18 gauge 41mm active tip outer cannula was advanced in trajectory view to the target.  This procedure was repeated for each target nerve.  Then, for all levels, the outer cannula placement was fine-tuned and the position was then confirmed with bi-planar imaging.    Test stimulation was done both at sensory and motor levels to ensure there was no radicular stimulation. The target tissues were then infiltrated with 1 ml of 1% Lidocaine without  Epinephrine. Subsequently, a percutaneous neurotomy was carried out for 60 seconds at 80 degrees Celsius. The procedure was repeated with the cannula rotated 90 degrees, for duration of 60 seconds, one additional time at each level for a total of two lesions per level.  After the completion of the two lesions, 1 ml of injectate was delivered. It was then repeated for each facet joint nerve mentioned above. Appropriate radiographs were obtained to verify the probe placement during the neurotomy.   Additional Comments:  The patient tolerated the procedure well Dressing: Band-Aid    Post-procedure details: Patient was observed during the procedure. Post-procedure instructions were reviewed.  Patient left the clinic in stable condition.

## 2018-06-15 ENCOUNTER — Ambulatory Visit (INDEPENDENT_AMBULATORY_CARE_PROVIDER_SITE_OTHER): Payer: Medicare Other | Admitting: *Deleted

## 2018-06-15 DIAGNOSIS — I495 Sick sinus syndrome: Secondary | ICD-10-CM

## 2018-06-15 DIAGNOSIS — R001 Bradycardia, unspecified: Secondary | ICD-10-CM

## 2018-06-15 NOTE — Progress Notes (Signed)
Remote pacemaker transmission.   

## 2018-06-23 ENCOUNTER — Other Ambulatory Visit (INDEPENDENT_AMBULATORY_CARE_PROVIDER_SITE_OTHER): Payer: Self-pay | Admitting: Physical Medicine and Rehabilitation

## 2018-06-23 NOTE — Telephone Encounter (Signed)
Please advise 

## 2018-07-21 LAB — CUP PACEART REMOTE DEVICE CHECK
Battery Remaining Longevity: 24 mo
Battery Remaining Percentage: 40 %
Brady Statistic RA Percent Paced: 100 %
Brady Statistic RV Percent Paced: 100 %
Date Time Interrogation Session: 20190618132000
Implantable Lead Implant Date: 20130926
Implantable Lead Implant Date: 20130926
Implantable Lead Location: 753859
Implantable Lead Location: 753860
Implantable Lead Model: 4456
Implantable Lead Model: 4476
Implantable Lead Serial Number: 473023
Implantable Lead Serial Number: 523746
Implantable Pulse Generator Implant Date: 20130926
Lead Channel Impedance Value: 386 Ohm
Lead Channel Impedance Value: 457 Ohm
Lead Channel Pacing Threshold Amplitude: 0.5 V
Lead Channel Pacing Threshold Amplitude: 0.9 V
Lead Channel Pacing Threshold Pulse Width: 0.3 ms
Lead Channel Pacing Threshold Pulse Width: 0.4 ms
Lead Channel Setting Pacing Amplitude: 1.4 V
Lead Channel Setting Pacing Amplitude: 2 V
Lead Channel Setting Pacing Pulse Width: 0.4 ms
Lead Channel Setting Sensing Sensitivity: 2.5 mV
Pulse Gen Serial Number: 138586

## 2018-07-25 ENCOUNTER — Encounter (HOSPITAL_COMMUNITY): Payer: Self-pay | Admitting: Emergency Medicine

## 2018-07-25 ENCOUNTER — Other Ambulatory Visit: Payer: Self-pay

## 2018-07-25 ENCOUNTER — Emergency Department (HOSPITAL_COMMUNITY)
Admission: EM | Admit: 2018-07-25 | Discharge: 2018-07-25 | Disposition: A | Discharge status: Home / Self Care | Payer: Medicare Other | Attending: Emergency Medicine | Admitting: Emergency Medicine

## 2018-07-25 ENCOUNTER — Emergency Department (HOSPITAL_COMMUNITY): Payer: Medicare Other

## 2018-07-25 DIAGNOSIS — S32502A Unspecified fracture of left pubis, initial encounter for closed fracture: Secondary | ICD-10-CM | POA: Insufficient documentation

## 2018-07-25 DIAGNOSIS — Y999 Unspecified external cause status: Secondary | ICD-10-CM | POA: Insufficient documentation

## 2018-07-25 DIAGNOSIS — X58XXXA Exposure to other specified factors, initial encounter: Secondary | ICD-10-CM | POA: Diagnosis not present

## 2018-07-25 DIAGNOSIS — Z79899 Other long term (current) drug therapy: Secondary | ICD-10-CM | POA: Insufficient documentation

## 2018-07-25 DIAGNOSIS — Z7982 Long term (current) use of aspirin: Secondary | ICD-10-CM | POA: Insufficient documentation

## 2018-07-25 DIAGNOSIS — R102 Pelvic and perineal pain: Secondary | ICD-10-CM | POA: Diagnosis present

## 2018-07-25 DIAGNOSIS — Z8673 Personal history of transient ischemic attack (TIA), and cerebral infarction without residual deficits: Secondary | ICD-10-CM | POA: Diagnosis not present

## 2018-07-25 DIAGNOSIS — Y939 Activity, unspecified: Secondary | ICD-10-CM | POA: Insufficient documentation

## 2018-07-25 DIAGNOSIS — S32592A Other specified fracture of left pubis, initial encounter for closed fracture: Secondary | ICD-10-CM

## 2018-07-25 DIAGNOSIS — S32501A Unspecified fracture of right pubis, initial encounter for closed fracture: Secondary | ICD-10-CM | POA: Insufficient documentation

## 2018-07-25 DIAGNOSIS — I10 Essential (primary) hypertension: Secondary | ICD-10-CM | POA: Insufficient documentation

## 2018-07-25 DIAGNOSIS — Y929 Unspecified place or not applicable: Secondary | ICD-10-CM | POA: Insufficient documentation

## 2018-07-25 DIAGNOSIS — Z859 Personal history of malignant neoplasm, unspecified: Secondary | ICD-10-CM | POA: Diagnosis not present

## 2018-07-25 DIAGNOSIS — R52 Pain, unspecified: Secondary | ICD-10-CM

## 2018-07-25 DIAGNOSIS — S32591A Other specified fracture of right pubis, initial encounter for closed fracture: Secondary | ICD-10-CM

## 2018-07-25 MED ORDER — HYDROCODONE-ACETAMINOPHEN 5-325 MG PO TABS: 1.0000 | ORAL_TABLET | ORAL | 0 refills | Status: DC | PRN

## 2018-07-25 MED ORDER — HYDROCODONE-ACETAMINOPHEN 5-325 MG PO TABS
1.0000 | ORAL_TABLET | Freq: Once | ORAL | Status: AC
  Administered 2018-07-25: 1 via ORAL
  Filled 2018-07-25: qty 1

## 2018-07-25 NOTE — ED Notes (Signed)
Patient verbalizes understanding of discharge instructions. Opportunity for questioning and answers were provided. Armband removed by staff, pt discharged from ED in wheelchair.  

## 2018-07-25 NOTE — ED Provider Notes (Signed)
[facility] EMERGENCY DEPARTMENT Provider Note   CSN: [id number] Arrival date & time: 07/25/18  8295     History   Chief Complaint Chief Complaint  Patient presents with  . Hip Pain    HPI Jacob Warner is a 84 y.o. male.  Patient with history of atrial fibrillation lives at home with his wife, pelvic fractures presents with worsening left pelvic and posterior pain for the past 5 days.  No falls or injuries.  Patient denies any syncope or other symptoms.  Wife is in the room and attest no other concerns.  Pain with walking.     Past Medical History:  Diagnosis Date  . A-fib ([place])   . Alopecia   . Anemia   . Bradycardia   . Cancer ([place])   . Cherry angioma   . GERD (gastroesophageal reflux disease)   . Glaucoma   . Hypertension   . Pelvic fracture ([place])   . Right BBB/left ant fasc block   . Sick sinus syndrome ([place])   . TIA (transient ischemic attack)   . Xeroderma     Patient Active Problem List   Diagnosis Date Noted  . Post laminectomy syndrome 03/19/2017  . Chronic pain syndrome 03/19/2017  . Chronic bilateral low back pain without sciatica 03/19/2017  . Bradycardia 05/31/2015  . Pacemaker 05/31/2015  . Peripheral edema 05/31/2015  . Heart murmur 05/31/2015  . Sick sinus syndrome ([place]) 05/31/2015  . Nonsustained ventricular tachycardia ([place]) 05/31/2015  . 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 APPENDECTOMY    . CERVICAL SPINE SURGERY    . EXPLORATORY LAPAROTOMY    . LITHOTRIPSY    . PACEMAKER PLACEMENT          Home Medications    Prior to Admission medications   Medication Sig Start Date End Date Taking? Authorizing Provider  acetaminophen (TYLENOL) 500 MG tablet Take 1,000 mg by mouth every 6 (six) hours as needed (pain).    Yes [provider]  amiodarone (PACERONE) 200 MG tablet TAKE 1 TABLET BY MOUTH EVERY DAY Patient taking differently: Take 200 mg by  mouth daily.  12/31/17  Yes [name], MD  aspirin 81 MG tablet Take 81 mg by mouth daily.   Yes [provider]  baclofen (LIORESAL) 10 MG tablet TAKE 1 TABLET BY MOUTH EVERY DAY IN THE EVENING Patient taking differently: Take 10 mg by mouth every evening.  06/23/18  Yes Magnus Sinning, MD  Cyanocobalamin (VITAMIN B-12 PO) Take 1 tablet by mouth daily.   Yes [provider]  denosumab (PROLIA) 60 MG/ML SOSY injection Inject 60 mg into the skin every 6 (six) months.   Yes [provider]  docusate sodium (COLACE) 100 MG capsule Take 100 mg by mouth daily.   Yes [provider]  doxazosin (CARDURA) 4 MG tablet Take 4 mg by mouth at bedtime.   Yes [provider]  dutasteride (AVODART) 0.5 MG capsule Take 0.5 mg by mouth daily.   Yes [provider]  furosemide (LASIX) 20 MG tablet Take 2 tabs (40 mg total) by mouth daily for 7 days only, then take 1 tab (20 mg total) by mouth daily thereafter. Patient taking differently: Take 20 mg by mouth daily.  05/14/18  Yes [name], MD  gabapentin (NEURONTIN) 300 MG capsule Take 300 mg by mouth every morning.    Yes [provider]  hydroxypropyl methylcellulose /  hypromellose (ISOPTO TEARS / GONIOVISC) 2.5 % ophthalmic solution Place 1 drop into both eyes as needed for dry eyes.   Yes [provider]  lactulose (CHRONULAC) 10 GM/15ML solution Take 10 g by mouth daily.  03/03/15  Yes [provider]  latanoprost (XALATAN) 0.005 % ophthalmic solution PLACE 1 DROP INTO BOTH EYES AT BEDTIME 02/16/16  Yes [provider]  Magnesium Oxide 400 (240 Mg) MG TABS Take 1 tablet by mouth 2 (two) times daily. 02/03/18  Yes [provider]  RESTASIS 0.05 % ophthalmic emulsion PLACE 1 DROP INTO BOTH EYES 2 TIMES DAILY. 03/03/16  Yes [provider]  traZODone (DESYREL) 50 MG tablet TAKE 1 TO 2 TABLETS AT BEDTIME AS NEEDED FOR INSOMNIA 03/23/18  Yes [provider]  Vitamin D, Ergocalciferol, (DRISDOL) 50000 units CAPS capsule Take 1 capsule by mouth every Tuesday.  08/04/16  Yes [provider]  White Petrolatum-Mineral Oil (EYE LUBRICANT) OINT Apply 1 strip to eye at bedtime.   Yes [provider]  HYDROcodone-acetaminophen (NORCO) 5-325 MG tablet Take 1 tablet by mouth every 4 (four) hours as needed. 07/25/18   Elnora Morrison, MD    Family History Family History  Problem Relation Age of Onset  . Brain cancer Sister     Social History Social History   Tobacco Use  . Smoking status: Never Smoker  . Smokeless tobacco: Never Used  Substance Use Topics  . Alcohol use: No    Alcohol/week: 0.0 oz  . Drug use: No     Allergies   Sulfa antibiotics and Sulfacetamide sodium   Review of Systems Review of Systems  Constitutional: Negative for chills and fever.  HENT: Negative for congestion.   Eyes: Negative for visual disturbance.  Respiratory: Negative for shortness of breath.   Cardiovascular: Negative for chest pain.  Gastrointestinal: Negative for abdominal pain and vomiting.  Genitourinary: Negative for dysuria and flank pain.  Musculoskeletal: Negative for back pain, neck pain and neck stiffness.  Skin: Negative for rash.  Neurological: Negative for light-headedness and headaches.     Physical Exam Updated Vital Signs BP 124/71   Pulse 70   Temp 98.7 F (37.1 C) (Oral)   Resp 18   Ht 5\' 10"  (1.778 m)   Wt 93 kg (205 lb)   SpO2 93%   BMI 29.41 kg/m   Physical Exam  Constitutional: He is oriented to person, place, and time. He appears well-developed and well-nourished.  HENT:  Head: Normocephalic and atraumatic.  Eyes: Conjunctivae are normal. Right eye exhibits no discharge. Left eye exhibits no discharge.  Neck: Normal range of motion. Neck supple. No tracheal deviation present.  Cardiovascular: Normal rate and regular rhythm.  Pulmonary/Chest: Effort normal and breath sounds normal.    Abdominal: Soft. He exhibits no distension. There is no tenderness. There is no guarding.  Musculoskeletal: He exhibits tenderness. He exhibits no edema.  Patient has tenderness with flexion of left hip and palpation left posterior pelvis and lateral pelvic brim.  No focal tenderness to left hip, small area of bruising lateral pelvis.  Neurological: He is alert and oriented to person, place, and time.  Skin: Skin is warm. No rash noted.  Psychiatric: He has a normal mood and affect.  Nursing note and vitals reviewed.    ED Treatments / Results  Labs (all labs ordered are listed, but only abnormal results are displayed) Labs Reviewed - No data to display  EKG None  Radiology Dg Hip Unilat With  Pelvis 2-3 Views Left  Result Date: 07/25/2018 CLINICAL DATA:  Left hip pain, no known injury, initial encounter EXAM: DG HIP (WITH OR WITHOUT PELVIS) 2-3V LEFT COMPARISON:  06/26/2016 FINDINGS: Pelvic ring is intact. Changes of prior pubic rami fractures are noted bilaterally with healing and sclerosis. No acute fracture or dislocation is noted. No soft tissue abnormality is seen. IMPRESSION: Chronic fractures in the pubic rami bilaterally. No acute abnormality noted. Electronically Signed   By: Inez Catalina M.D.   On: 07/25/2018 10:36    Procedures Procedures (including critical care time)  Medications Ordered in ED Medications  HYDROcodone-acetaminophen (NORCO/VICODIN) 5-325 MG per tablet 1 tablet (1 tablet Oral Given 07/25/18 1056)     Initial Impression / Assessment and Plan / ED Course  I have reviewed the triage vital signs and the nursing notes.  Pertinent labs & imaging results that were available during my care of the patient were reviewed by me and considered in my medical decision making (see chart for details).    Patient presents with worsening pelvic pain on the left, x-ray no acute fracture.  Patient has no medical or other symptoms.  Patient can walk with discomfort and  discussed follow-up with orthopedics early this week.  Final Clinical Impressions(s) / ED Diagnoses   Final diagnoses:  Bilateral pubic rami fractures, closed, initial encounter [facility])    ED Discharge Orders        Ordered    HYDROcodone-acetaminophen (NORCO) 5-325 MG tablet  Every 4 hours PRN     07/25/18 1122       Elnora Morrison, MD 07/25/18 1631

## 2018-07-25 NOTE — Discharge Instructions (Signed)
For severe pain take norco or vicodin however realize they have the potential for addiction and it can make you sleepy and has tylenol in it.  No operating machinery while taking.  See ortho this week, minimize weight bearing until you see them.   If you were given medicines take as directed.  If you are on coumadin or contraceptives realize their levels and effectiveness is altered by many different medicines.  If you have any reaction (rash, tongues swelling, other) to the medicines stop taking and see a physician.    If your blood pressure was elevated in the ER make sure you follow up for management with a primary doctor or return for chest pain, shortness of breath or stroke symptoms.  Please follow up as directed and return to the ER or see a physician for new or worsening symptoms.  Thank you. Vitals:   07/25/18 0935 07/25/18 1100 07/25/18 1115  BP: 134/73 120/70 124/71  Pulse: 82 70 70  Resp: 20 18 18   Temp: 98.7 F (37.1 C)    TempSrc: Oral    SpO2: 95% 95% 93%  Weight: 93 kg (205 lb)    Height: 5\' 10"  (1.778 m)

## 2018-07-25 NOTE — ED Notes (Signed)
Patient transported to X-ray 

## 2018-07-25 NOTE — ED Notes (Signed)
Pt left for Xray.

## 2018-07-25 NOTE — ED Triage Notes (Signed)
Pt. Stated, Ive had left hip pain since last Thursday. Ive not been able to walk since then.

## 2018-07-29 ENCOUNTER — Ambulatory Visit: Payer: Medicare Other

## 2018-07-29 ENCOUNTER — Ambulatory Visit (INDEPENDENT_AMBULATORY_CARE_PROVIDER_SITE_OTHER): Payer: Medicare Other | Admitting: Specialist

## 2018-07-29 ENCOUNTER — Ambulatory Visit
Admission: RE | Admit: 2018-07-29 | Discharge: 2018-07-29 | Disposition: A | Discharge status: Home / Self Care | Payer: Medicare Other | Source: Ambulatory Visit | Attending: Specialist | Admitting: Specialist

## 2018-07-29 ENCOUNTER — Encounter (INDEPENDENT_AMBULATORY_CARE_PROVIDER_SITE_OTHER): Payer: Self-pay | Admitting: Specialist

## 2018-07-29 VITALS — BP 130/64 | HR 70 | Ht 70.0 in | Wt 205.0 lb

## 2018-07-29 DIAGNOSIS — M25552 Pain in left hip: Secondary | ICD-10-CM

## 2018-07-29 DIAGNOSIS — S7002XA Contusion of left hip, initial encounter: Secondary | ICD-10-CM

## 2018-07-29 MED ORDER — HYDROCODONE-ACETAMINOPHEN 5-325 MG PO TABS: 1.0000 | ORAL_TABLET | ORAL | 0 refills | Status: DC | PRN

## 2018-07-29 NOTE — Patient Instructions (Addendum)
Use a walker for ambulating, weight bearing partially 50% on the left leg.  CT scan of the left pelvis and left hip to assess for left femoral neck fracture of left Sacral fracture.  Hydrocodone for discomfort.  Ice 30 min on and 30 minutes off for 3-4 days. Home health nursing for PTwill be ordered if the CT scan shows no acute hip fracture.

## 2018-07-29 NOTE — Progress Notes (Signed)
Office Visit Note   Patient: Jacob Warner           Date of Birth: 1924-11-09           MRN: [id number] Visit Date: 07/29/2018              Requested by: [name], MD [place], [place] [address] PCP: [name], MD   Assessment & Plan: Visit Diagnoses:  1. Pain of left hip joint   2. Contusion of left hip, initial encounter     Plan:  Use a walker for ambulating, weight bearing partially 50% on the left leg.  CT scan of the left pelvis and left hip to assess for left femoral neck fracture of left Sacral fracture.  Hydrocodone for discomfort.  Ice 30 min on and 30 minutes off for 3-4 days. Home health nursing for PTwill be ordered if the CT scan shows no acute hip fracture.  Follow-Up Instructions: No follow-ups on file.   Orders:  Orders Placed This Encounter  Procedures  . CT PELVIS WO CONTRAST  . CT HIP LEFT WO CONTRAST   No orders of the defined types were placed in this encounter.     Procedures: No procedures performed   Clinical Data: No additional findings.   Subjective: Chief Complaint  Patient presents with  . Pelvis - Injury    84 year old male with history of bilateral pubic ramus fractures 4 years ago due to a fall at home in the garage, he fell backwards onto his bottom at the time and was seen at [facility] [place] [facility]. He was admitted to the hospital and had rehabilitation for 30 days. He reports that he had an injury to his left pelvis and hip area on Sunday 07/25/2018 when he trying to sit and turned to the right and the left buttock hit the arm of a lift chair. He had immediate pain and discomfort with standing and walking.  Seen Sunday at the ER and xrays showed old pubic rami fractures but no new findings.   Review of Systems  Constitutional: Negative.   HENT: Negative.   Eyes: Negative.   Respiratory: Negative.   Cardiovascular: Negative.   Gastrointestinal: Negative.     Endocrine: Negative.   Genitourinary: Negative.   Musculoskeletal: Negative.   Skin: Negative.   Allergic/Immunologic: Negative.   Neurological: Negative.   Hematological: Negative.   Psychiatric/Behavioral: Negative.      Objective: Vital Signs: BP 130/64 (BP Location: Left Arm, Patient Position: Sitting)   Pulse 70   Ht 5\' 10"  (1.778 m)   Wt 205 lb (93 kg)   BMI 29.41 kg/m   Physical Exam  Constitutional: He is oriented to person, place, and time. He appears well-developed and well-nourished.  HENT:  Head: Normocephalic and atraumatic.  Eyes: Pupils are equal, round, and reactive to light. EOM are normal.  Neck: Normal range of motion. Neck supple.  Pulmonary/Chest: Effort normal and breath sounds normal.  Abdominal: Soft. Bowel sounds are normal.  Neurological: He is alert and oriented to person, place, and time.  Skin: Skin is warm and dry.  Psychiatric: He has a normal mood and affect. His behavior is normal. Judgment and thought content normal.    Right Hip Exam  Right hip exam is normal.   Range of Motion  Abduction: normal  Adduction: normal  Extension: normal  Flexion: normal  External rotation: normal  Internal rotation: normal   Muscle  Strength  Abduction: 5/5  Adduction: 5/5  Flexion: 5/5   Tests  FABER: negative Ober: negative  Other  Erythema: absent Scars: absent Sensation: normal Pulse: present   Left Hip Exam   Tenderness  The patient is experiencing tenderness in the greater trochanter and posterior.  Range of Motion  Abduction: abnormal  Adduction: abnormal  Extension: abnormal  Flexion: abnormal  External rotation: abnormal  Internal rotation: abnormal   Muscle Strength  Abduction: 5/5  Adduction: 5/5  Flexion: 5/5   Tests  FABER: negative  Other  Erythema: absent Scars: absent Sensation: normal Pulse: present      Specialty Comments:  No specialty comments available.  Imaging: No results  found.   PMFS History: Patient Active Problem List   Diagnosis Date Noted  . Post laminectomy syndrome 03/19/2017  . Chronic pain syndrome 03/19/2017  . Chronic bilateral low back pain without sciatica 03/19/2017  . Bradycardia 05/31/2015  . Pacemaker 05/31/2015  . Peripheral edema 05/31/2015  . Heart murmur 05/31/2015  . Sick sinus syndrome ([place]) 05/31/2015  . Nonsustained ventricular tachycardia ([place]) 05/31/2015  . 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 A-fib ([place])   . Alopecia   . Anemia   . Bradycardia   . Cancer ([place])   . Cherry angioma   . GERD (gastroesophageal reflux disease)   . Glaucoma   . Hypertension   . Pelvic fracture ([place])   . Right BBB/left ant fasc block   . Sick sinus syndrome ([place])   . TIA (transient ischemic attack)   . Xeroderma     Family History  Problem Relation Age of Onset  . Brain cancer Sister     Past Surgical History:  Procedure Laterality Date  . APPENDECTOMY    . CERVICAL SPINE SURGERY    . EXPLORATORY LAPAROTOMY    . LITHOTRIPSY    . PACEMAKER PLACEMENT     Social History   Occupational History  . Not on file  Tobacco Use  . Smoking status: Never Smoker  . Smokeless tobacco: Never Used  Substance and Sexual Activity  . Alcohol use: No    Alcohol/week: 0.0 oz  . Drug use: No  . Sexual activity: Not on file

## 2018-07-30 ENCOUNTER — Telehealth (INDEPENDENT_AMBULATORY_CARE_PROVIDER_SITE_OTHER): Payer: Self-pay | Admitting: Specialist

## 2018-07-30 NOTE — Telephone Encounter (Signed)
Patient's wife Cheri Rous) called left voicemail message needing results of CT scan. The number to contact Cheri Rous is [phone]

## 2018-07-30 NOTE — Telephone Encounter (Signed)
Patient's wife Cheri Rous) called left voicemail message needing results of CT scan. The number to contact Cheri Rous is [phone]

## 2018-08-12 ENCOUNTER — Encounter (INDEPENDENT_AMBULATORY_CARE_PROVIDER_SITE_OTHER): Payer: Self-pay | Admitting: Specialist

## 2018-08-12 ENCOUNTER — Ambulatory Visit (INDEPENDENT_AMBULATORY_CARE_PROVIDER_SITE_OTHER): Payer: Medicare Other | Admitting: Specialist

## 2018-08-12 VITALS — BP 116/61 | HR 82 | Ht 70.0 in | Wt 205.0 lb

## 2018-08-12 DIAGNOSIS — R102 Pelvic and perineal pain: Secondary | ICD-10-CM

## 2018-08-12 NOTE — Progress Notes (Signed)
Office Visit Note   Patient: Jacob Warner           Date of Birth: 11-12-1924           MRN: [id number] Visit Date: 08/12/2018              Requested by: [name], MD [place], [place] [address] PCP: [name], MD   Assessment & Plan: Visit Diagnoses:  1. Pain in pelvis     Plan:Continue with a walker, may full weight bearing in the house increasing his activities. May go out of the house twice this week for lunch or dinner and then After 2 weeks discontinue the walker.  Need to know the results of the bone density test done recently. Testosterone level may also need to be assess as this can potentiate weakening of bone strength.   Follow-Up Instructions: Return in about 4 weeks (around 09/09/2018).   Orders:  No orders of the defined types were placed in this encounter.  No orders of the defined types were placed in this encounter.     Procedures: No procedures performed   Clinical Data: No additional findings.   Subjective: Chief Complaint  Patient presents with  . Pelvis - Follow-up    CT scan Review    84 year old male with history of previous bilateral pubic ramus fractures treated with hospital and SNF. He has been walking in the house mainly and the pain levels are much improved. The area of left femoral neck changes was not shown on CT scan to be of concern. The left sacrum has evidence of remote fracture as well as bilateral pubic  Symphysis remote injury. There was change withing the left gluteal area that could represent bruise or hematoma.    Review of Systems   Objective: Vital Signs: BP 116/61 (BP Location: Left Arm, Patient Position: Sitting)   Pulse 82   Ht 5\' 10"  (1.778 m)   Wt 205 lb (93 kg)   BMI 29.41 kg/m   Physical Exam  Constitutional: He is oriented to person, place, and time. He appears well-developed and well-nourished.  HENT:  Head: Normocephalic and atraumatic.  Eyes: Pupils are equal,  round, and reactive to light. EOM are normal.  Neck: Normal range of motion. Neck supple.  Pulmonary/Chest: Effort normal and breath sounds normal.  Abdominal: Soft. Bowel sounds are normal.  Musculoskeletal: Normal range of motion.  Neurological: He is alert and oriented to person, place, and time.  Skin: Skin is warm and dry.  Psychiatric: He has a normal mood and affect. His behavior is normal. Judgment and thought content normal.    Back Exam   Tenderness  The patient is experiencing tenderness in the lumbar.  Range of Motion  Extension: normal  Flexion: normal  Lateral bend right: normal  Lateral bend left: normal  Rotation right: normal  Rotation left: normal   Muscle Strength  Right Quadriceps:  5/5  Left Quadriceps:  5/5  Right Hamstrings:  5/5  Left Hamstrings:  5/5   Tests  Straight leg raise right: negative Straight leg raise left: negative  Reflexes  Patellar: normal Achilles: normal Biceps: normal Babinski's sign: normal   Other  Toe walk: normal Heel walk: normal Sensation: normal Gait: normal  Erythema: no back redness Scars: absent      Specialty Comments:  No specialty comments available.  Imaging: No results found.   PMFS History: Patient Active Problem List   Diagnosis Date  Noted  . Post laminectomy syndrome 03/19/2017  . Chronic pain syndrome 03/19/2017  . Chronic bilateral low back pain without sciatica 03/19/2017  . Bradycardia 05/31/2015  . Pacemaker 05/31/2015  . Peripheral edema 05/31/2015  . Heart murmur 05/31/2015  . Sick sinus syndrome ([place]) 05/31/2015  . Nonsustained ventricular tachycardia ([place]) 05/31/2015  . 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 A-fib ([place])   . Alopecia   . Anemia   . Bradycardia   . Cancer ([place])   . Cherry angioma   . GERD (gastroesophageal reflux disease)   . Glaucoma   . Hypertension   . Pelvic fracture ([place])   . Right BBB/left ant fasc block    . Sick sinus syndrome ([place])   . TIA (transient ischemic attack)   . Xeroderma     Family History  Problem Relation Age of Onset  . Brain cancer Sister     Past Surgical History:  Procedure Laterality Date  . APPENDECTOMY    . CERVICAL SPINE SURGERY    . EXPLORATORY LAPAROTOMY    . LITHOTRIPSY    . PACEMAKER PLACEMENT     Social History   Occupational History  . Not on file  Tobacco Use  . Smoking status: Never Smoker  . Smokeless tobacco: Never Used  Substance and Sexual Activity  . Alcohol use: No    Alcohol/week: 0.0 standard drinks  . Drug use: No  . Sexual activity: Not on file

## 2018-08-12 NOTE — Patient Instructions (Addendum)
Continue with a walker, may full weight bearing in the house increasing his activities. May go out of the house twice this week for lunch or dinner and then After 2 weeks discontinue the walker.  Need to know the results of the bone density test done recently. Testosterone level may also need to be assess as this can potentiate weakening of bone strength.

## 2018-08-17 ENCOUNTER — Other Ambulatory Visit (INDEPENDENT_AMBULATORY_CARE_PROVIDER_SITE_OTHER): Payer: Self-pay | Admitting: Physical Medicine and Rehabilitation

## 2018-08-17 NOTE — Telephone Encounter (Signed)
Please advise 

## 2018-08-25 ENCOUNTER — Other Ambulatory Visit: Payer: Self-pay | Admitting: Cardiology

## 2018-08-27 ENCOUNTER — Encounter: Payer: Self-pay | Admitting: Internal Medicine

## 2018-08-27 ENCOUNTER — Ambulatory Visit: Payer: Medicare Other | Admitting: Internal Medicine

## 2018-08-27 VITALS — BP 112/58 | HR 72 | Ht 70.0 in | Wt 210.0 lb

## 2018-08-27 DIAGNOSIS — R609 Edema, unspecified: Secondary | ICD-10-CM | POA: Diagnosis not present

## 2018-08-27 DIAGNOSIS — I495 Sick sinus syndrome: Secondary | ICD-10-CM | POA: Diagnosis not present

## 2018-08-27 DIAGNOSIS — Z950 Presence of cardiac pacemaker: Secondary | ICD-10-CM

## 2018-08-27 MED ORDER — AMIODARONE HCL 200 MG PO TABS: ORAL_TABLET | ORAL | 3 refills | Status: DC

## 2018-08-27 NOTE — Progress Notes (Signed)
HPI Mr. Blough returns today for follow-up of his pacemaker and paroxysmal atrial fibrillation. He is an 84 year old man with a history of symptomatic sinus node dysfunction status post permanent pacemaker insertion. The patient has experienced problems with peripheral edema in the interim as well as minimal dyspnea with exertion. No PND. No orthopnea. He has had no syncope. He denies chest pain. He admits to a sedentary lifestyle secondary to severe arthritis and neuropathic pain. He has fallen and broken his pelvis. Allergies  Allergen Reactions  . Sulfa Antibiotics Hives and Rash    Bactrim  . Sulfacetamide Sodium Rash and Hives    Bactrim     Current Outpatient Medications  Medication Sig Dispense Refill  . acetaminophen (TYLENOL) 500 MG tablet Take 1,000 mg by mouth every 6 (six) hours as needed (pain).     Marland Kitchen aspirin 81 MG tablet Take 81 mg by mouth daily.    . baclofen (LIORESAL) 10 MG tablet TAKE 1 TABLET BY MOUTH EVERY DAY IN THE EVENING 60 tablet 0  . Cyanocobalamin (VITAMIN B-12 PO) Take 1 tablet by mouth daily.    Marland Kitchen denosumab (PROLIA) 60 MG/ML SOSY injection Inject 60 mg into the skin every 6 (six) months.    . docusate sodium (COLACE) 100 MG capsule Take 100 mg by mouth daily.    Marland Kitchen doxazosin (CARDURA) 4 MG tablet Take 4 mg by mouth at bedtime.    . dutasteride (AVODART) 0.5 MG capsule Take 0.5 mg by mouth daily.    . furosemide (LASIX) 20 MG tablet Take 1 tablet (20 mg total) by mouth daily. 90 tablet 2  . gabapentin (NEURONTIN) 300 MG capsule Take 300 mg by mouth every morning.     Marland Kitchen HYDROcodone-acetaminophen (NORCO) 5-325 MG tablet Take 1 tablet by mouth every 4 (four) hours as needed. 10 tablet 0  . hydroxypropyl methylcellulose / hypromellose (ISOPTO TEARS / GONIOVISC) 2.5 % ophthalmic solution Place 1 drop into both eyes as needed for dry eyes.    Marland Kitchen lactulose (CHRONULAC) 10 GM/15ML solution Take 10 g by mouth daily.   0  . latanoprost (XALATAN) 0.005 % ophthalmic  solution PLACE 1 DROP INTO BOTH EYES AT BEDTIME  11  . Magnesium Oxide 400 (240 Mg) MG TABS Take 1 tablet by mouth 2 (two) times daily.  7  . RESTASIS 0.05 % ophthalmic emulsion PLACE 1 DROP INTO BOTH EYES 2 TIMES DAILY.  11  . traZODone (DESYREL) 50 MG tablet TAKE 1 TO 2 TABLETS AT BEDTIME AS NEEDED FOR INSOMNIA  11  . Vitamin D, Ergocalciferol, (DRISDOL) 50000 units CAPS capsule Take 1 capsule by mouth every Tuesday.     . White Petrolatum-Mineral Oil (EYE LUBRICANT) OINT Apply 1 strip to eye at bedtime.    Marland Kitchen amiodarone (PACERONE) 200 MG tablet Take one tablet by mouth daily Monday through Friday. Do NOT take on Saturday or Sunday. 90 tablet 3   Current Facility-Administered Medications  Medication Dose Route Frequency Provider Last Rate Last Dose  . methylPREDNISolone acetate (DEPO-MEDROL) injection 80 mg  80 mg Other Once Magnus Sinning, MD         Past Medical History:  Diagnosis Date  . A-fib ([place])   . Alopecia   . Anemia   . Bradycardia   . Cancer ([place])   . Cherry angioma   . GERD (gastroesophageal reflux disease)   . Glaucoma   . Hypertension   . Pelvic fracture ([place])   . Right BBB/left ant  fasc block   . Sick sinus syndrome ([place])   . TIA (transient ischemic attack)   . Xeroderma     ROS:   All systems reviewed and negative except as noted in the HPI.   Past Surgical History:  Procedure Laterality Date  . APPENDECTOMY    . CERVICAL SPINE SURGERY    . EXPLORATORY LAPAROTOMY    . LITHOTRIPSY    . PACEMAKER PLACEMENT       Family History  Problem Relation Age of Onset  . Brain cancer Sister      Social History   Socioeconomic History  . Marital status: Married    Spouse name: Not on file  . Number of children: Not on file  . Years of education: Not on file  . Highest education level: Not on file  Occupational History  . Not on file  Social Needs  . Financial resource strain: Not on file  . Food insecurity:    Worry: Not on file    Inability:  Not on file  . Transportation needs:    Medical: Not on file    Non-medical: Not on file  Tobacco Use  . Smoking status: Never Smoker  . Smokeless tobacco: Never Used  Substance and Sexual Activity  . Alcohol use: No    Alcohol/week: 0.0 standard drinks  . Drug use: No  . Sexual activity: Not on file  Lifestyle  . Physical activity:    Days per week: Not on file    Minutes per session: Not on file  . Stress: Not on file  Relationships  . Social connections:    Talks on phone: Not on file    Gets together: Not on file    Attends religious service: Not on file    Active member of club or organization: Not on file    Attends meetings of clubs or organizations: Not on file    Relationship status: Not on file  . Intimate partner violence:    Fear of current or ex partner: Not on file    Emotionally abused: Not on file    Physically abused: Not on file    Forced sexual activity: Not on file  Other Topics Concern  . Not on file  Social History Narrative  . Not on file     BP (!) 112/58   Pulse 72   Ht 5\' 10"  (1.778 m)   Wt 210 lb (95.3 kg)   BMI 30.13 kg/m   Physical Exam:  Well appearing elderly man, NAD HEENT: Unremarkable Neck:  No JVD, no thyromegally Lymphatics:  No adenopathy Back:  No CVA tenderness Lungs:  Clear with no wheezes HEART:  Regular rate rhythm, no murmurs, no rubs, no clicks Abd:  soft, positive bowel sounds, no organomegally, no rebound, no guarding Ext:  2 plus pulses, 1+ edema, no cyanosis, no clubbing Skin:  No rashes no nodules Neuro:  CN II through XII intact, motor grossly intact  EKG - NSR with AV pacing  DEVICE  Normal device function.  See PaceArt for details.   Assess/Plan: 1. PAF - he is maintaining NSR. He will reduce his does of amio to 200 mg daily Mon-Fri and none on Sat/Sun. 2. PPM - his [facility] sci DDD PM is working normally. We will recheck in several months. 3. Sinus node dysfunction - he is asymptomatic, s/p PPM. 4.  Falls - he gets weak. He is not a good candidate for systemic anti-coag.  Mikle Bosworth.D.

## 2018-08-27 NOTE — Patient Instructions (Addendum)
Medication Instructions:  Your physician has recommended you make the following change in your medication:  1.  Take amiodarone 200 mg tablet- Take one tablet by mouth Monday through Friday.  DO NOT take on Saturday or Sunday.  Labwork: None ordered.  Testing/Procedures: None ordered.  Follow-Up: Your physician wants you to follow-up in: one year with Dr. Lovena Le.   You will receive a reminder letter in the mail two months in advance. If you don't receive a letter, please call our office to schedule the follow-up appointment.  Remote monitoring is used to monitor your Pacemaker from home. This monitoring reduces the number of office visits required to check your device to one time per year. It allows [place] to keep an eye on the functioning of your device to ensure it is working properly. You are scheduled for a device check from home on 09/14/2018. You may send your transmission at any time that day. If you have a wireless device, the transmission will be sent automatically. After your physician reviews your transmission, you will receive a postcard with your next transmission date.  Any Other Special Instructions Will Be Listed Below (If Applicable).  If you need a refill on your cardiac medications before your next appointment, please call your pharmacy.

## 2018-08-30 ENCOUNTER — Encounter (HOSPITAL_COMMUNITY): Payer: Self-pay | Admitting: Emergency Medicine

## 2018-08-30 ENCOUNTER — Emergency Department (HOSPITAL_COMMUNITY)
Admission: EM | Admit: 2018-08-30 | Discharge: 2018-08-31 | Disposition: A | Discharge status: Home / Self Care | Payer: Medicare Other | Attending: Emergency Medicine | Admitting: Emergency Medicine

## 2018-08-30 DIAGNOSIS — Z950 Presence of cardiac pacemaker: Secondary | ICD-10-CM | POA: Diagnosis not present

## 2018-08-30 DIAGNOSIS — I10 Essential (primary) hypertension: Secondary | ICD-10-CM | POA: Insufficient documentation

## 2018-08-30 DIAGNOSIS — Z8673 Personal history of transient ischemic attack (TIA), and cerebral infarction without residual deficits: Secondary | ICD-10-CM | POA: Diagnosis not present

## 2018-08-30 DIAGNOSIS — Z79899 Other long term (current) drug therapy: Secondary | ICD-10-CM | POA: Diagnosis not present

## 2018-08-30 DIAGNOSIS — Z7982 Long term (current) use of aspirin: Secondary | ICD-10-CM | POA: Insufficient documentation

## 2018-08-30 DIAGNOSIS — N3090 Cystitis, unspecified without hematuria: Secondary | ICD-10-CM | POA: Diagnosis not present

## 2018-08-30 DIAGNOSIS — R300 Dysuria: Secondary | ICD-10-CM | POA: Diagnosis present

## 2018-08-30 DIAGNOSIS — Z859 Personal history of malignant neoplasm, unspecified: Secondary | ICD-10-CM | POA: Insufficient documentation

## 2018-08-30 LAB — URINALYSIS, ROUTINE W REFLEX MICROSCOPIC
Bacteria, UA: NONE SEEN
Bilirubin Urine: NEGATIVE
Glucose, UA: NEGATIVE mg/dL
Ketones, ur: NEGATIVE mg/dL
Nitrite: NEGATIVE
Protein, ur: 100 mg/dL — AB
RBC / HPF: >50 RBC/hpf — ABNORMAL HIGH (ref 0–5)
Specific Gravity, Urine: 1.023 (ref 1.005–1.030)
WBC, UA: >50 WBC/hpf — ABNORMAL HIGH (ref 0–5)
pH: 5 (ref 5.0–8.0)

## 2018-08-30 LAB — BASIC METABOLIC PANEL
Anion gap: 11 (ref 5–15)
BUN: 22 mg/dL (ref 8–23)
CO2: 27 mmol/L (ref 22–32)
Calcium: 9.6 mg/dL (ref 8.9–10.3)
Chloride: 100 mmol/L (ref 98–111)
Creatinine, Ser: 1.59 mg/dL — ABNORMAL HIGH (ref 0.61–1.24)
GFR calc Af Amer: 42 mL/min — ABNORMAL LOW (ref >60)
GFR calc non Af Amer: 36 mL/min — ABNORMAL LOW (ref >60)
Glucose, Bld: 143 mg/dL — ABNORMAL HIGH (ref 70–99)
Potassium: 3.6 mmol/L (ref 3.5–5.1)
Sodium: 138 mmol/L (ref 135–145)

## 2018-08-30 LAB — CBC
HCT: 36.1 % — ABNORMAL LOW (ref 39.0–52.0)
Hemoglobin: 11.6 g/dL — ABNORMAL LOW (ref 13.0–17.0)
MCH: 33 pg (ref 26.0–34.0)
MCHC: 32.1 g/dL (ref 30.0–36.0)
MCV: 102.6 fL — ABNORMAL HIGH (ref 78.0–100.0)
Platelets: 186 10*3/uL (ref 150–400)
RBC: 3.52 MIL/uL — ABNORMAL LOW (ref 4.22–5.81)
RDW: 13.1 % (ref 11.5–15.5)
WBC: 7.9 10*3/uL (ref 4.0–10.5)

## 2018-08-30 MED ORDER — CEFTRIAXONE SODIUM 1 G IJ SOLR
1.0000 g | Freq: Once | INTRAMUSCULAR | Status: AC
  Administered 2018-08-31: 1 g via INTRAMUSCULAR
  Filled 2018-08-30: qty 10

## 2018-08-30 NOTE — ED Notes (Addendum)
Pt reports pain in his penis when he urinates and blood noted as well. Pt also reports frequent urination.

## 2018-08-30 NOTE — ED Triage Notes (Signed)
Pt reports dysuria and "cystitis" x1.5 hours. Reports hematuria as well.

## 2018-08-31 ENCOUNTER — Emergency Department (HOSPITAL_COMMUNITY): Payer: Medicare Other

## 2018-08-31 MED ORDER — LIDOCAINE HCL (PF) 1 % IJ SOLN
INTRAMUSCULAR | Status: AC
  Administered 2018-08-31: 5 mL
  Filled 2018-08-31: qty 5

## 2018-08-31 MED ORDER — CEPHALEXIN 500 MG PO CAPS: 500.0000 mg | ORAL_CAPSULE | Freq: Three times a day (TID) | ORAL | 0 refills | Status: DC

## 2018-08-31 NOTE — Discharge Instructions (Addendum)
You were seen today and have a urinary tract infection.  Take antibiotics as prescribed.  If you develop back pain, nausea, vomiting, fevers, you need to be reevaluated immediately.

## 2018-08-31 NOTE — ED Notes (Signed)
Patient transported to X-ray 

## 2018-08-31 NOTE — ED Provider Notes (Signed)
[facility] EMERGENCY DEPARTMENT Provider Note   CSN: [id number] Arrival date & time: 08/30/18  2014     History   Chief Complaint Chief Complaint  Patient presents with  . Dysuria    HPI Jacob Warner is a 84 y.o. male.  HPI  This is a 84 year old male with a history of A. fib, TIA who presents with dysuria and frequency.  Patient reports onset of symptoms tonight.  He states that he has pain with urination.  He is urinating frequently every 30 minutes or so.  He does report hematuria as well.  He states that this feels like when he has had prior cystitis.  He denies any fever, back pain, abdominal pain, nausea, vomiting, diarrhea.  He does report history of kidney stones but states "that pain was excruciating."  Denies any similar pain this time.  Past Medical History:  Diagnosis Date  . A-fib ([place])   . Alopecia   . Anemia   . Bradycardia   . Cancer ([place])   . Cherry angioma   . GERD (gastroesophageal reflux disease)   . Glaucoma   . Hypertension   . Pelvic fracture ([place])   . Right BBB/left ant fasc block   . Sick sinus syndrome ([place])   . TIA (transient ischemic attack)   . Xeroderma     Patient Active Problem List   Diagnosis Date Noted  . Post laminectomy syndrome 03/19/2017  . Chronic pain syndrome 03/19/2017  . Chronic bilateral low back pain without sciatica 03/19/2017  . Bradycardia 05/31/2015  . Pacemaker 05/31/2015  . Peripheral edema 05/31/2015  . Heart murmur 05/31/2015  . Sick sinus syndrome ([place]) 05/31/2015  . Nonsustained ventricular tachycardia ([place]) 05/31/2015  . 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 APPENDECTOMY    . CERVICAL SPINE SURGERY    . EXPLORATORY LAPAROTOMY    . LITHOTRIPSY    . PACEMAKER PLACEMENT          Home Medications    Prior to Admission medications   Medication Sig Start Date End Date Taking? Authorizing Provider  acetaminophen (TYLENOL)  500 MG tablet Take 1,000 mg by mouth every 6 (six) hours as needed (pain).     [provider]  amiodarone (PACERONE) 200 MG tablet Take one tablet by mouth daily Monday through Friday. Do NOT take on Saturday or Sunday. 08/27/18   [name], MD  aspirin 81 MG tablet Take 81 mg by mouth daily.    [provider]  baclofen (LIORESAL) 10 MG tablet TAKE 1 TABLET BY MOUTH EVERY DAY IN THE EVENING 08/17/18   Magnus Sinning, MD  cephALEXin (KEFLEX) 500 MG capsule Take 1 capsule (500 mg total) by mouth 3 (three) times daily. 08/31/18   Horton, Barbette Hair, MD  Cyanocobalamin (VITAMIN B-12 PO) Take 1 tablet by mouth daily.    [provider]  denosumab (PROLIA) 60 MG/ML SOSY injection Inject 60 mg into the skin every 6 (six) months.    [provider]  docusate sodium (COLACE) 100 MG capsule Take 100 mg by mouth daily.    [provider]  doxazosin (CARDURA) 4 MG tablet Take 4 mg by mouth at bedtime.    [provider]  dutasteride (AVODART) 0.5 MG capsule Take 0.5 mg by mouth daily.    [provider]  furosemide (LASIX) 20 MG tablet Take 1 tablet (20 mg total) by mouth daily. 08/25/18  [name], MD  gabapentin (NEURONTIN) 300 MG capsule Take 300 mg by mouth every morning.     [provider]  HYDROcodone-acetaminophen (NORCO) 5-325 MG tablet Take 1 tablet by mouth every 4 (four) hours as needed. 07/29/18   Jessy Oto, MD  hydroxypropyl methylcellulose / hypromellose (ISOPTO TEARS / GONIOVISC) 2.5 % ophthalmic solution Place 1 drop into both eyes as needed for dry eyes.    [provider]  lactulose (CHRONULAC) 10 GM/15ML solution Take 10 g by mouth daily.  03/03/15   [provider]  latanoprost (XALATAN) 0.005 % ophthalmic solution PLACE 1 DROP INTO BOTH EYES AT BEDTIME 02/16/16   [provider]  Magnesium Oxide 400 (240 Mg) MG TABS Take 1 tablet by mouth 2 (two) times daily. 02/03/18   [provider]  RESTASIS 0.05 % ophthalmic emulsion PLACE 1 DROP INTO BOTH EYES 2 TIMES DAILY. 03/03/16   [provider]  traZODone (DESYREL) 50 MG tablet TAKE 1 TO 2 TABLETS AT BEDTIME AS NEEDED FOR INSOMNIA 03/23/18   [provider]  Vitamin D, Ergocalciferol, (DRISDOL) 50000 units CAPS capsule Take 1 capsule by mouth every Tuesday.  08/04/16   [provider]  White Petrolatum-Mineral Oil (EYE LUBRICANT) OINT Apply 1 strip to eye at bedtime.    [provider]    Family History Family History  Problem Relation Age of Onset  . Brain cancer Sister     Social History Social History   Tobacco Use  . Smoking status: Never Smoker  . Smokeless tobacco: Never Used  Substance Use Topics  . Alcohol use: No    Alcohol/week: 0.0 standard drinks  . Drug use: No     Allergies   Sulfa antibiotics and Sulfacetamide sodium   Review of Systems Review of Systems  Constitutional: Negative for fever.  Respiratory: Negative for shortness of breath.   Cardiovascular: Negative for chest pain.  Gastrointestinal: Negative for abdominal pain, nausea and vomiting.  Genitourinary: Positive for dysuria, frequency, hematuria and urgency. Negative for flank pain.  Musculoskeletal: Negative for back pain.  All other systems reviewed and are negative.    Physical Exam Updated Vital Signs BP 125/71   Pulse 83   Temp 98.6 F (37 C)   Resp 20   Ht 5\' 10"  (1.778 m)   Wt 95.3 kg   SpO2 94%   BMI 30.13 kg/m   Physical Exam  Constitutional: He is oriented to person, place, and time. He appears well-developed and well-nourished. No distress.  HENT:  Head: Normocephalic and atraumatic.  Eyes: Pupils are equal, round, and reactive to light.  Neck: Neck supple.  Cardiovascular: Normal rate, regular rhythm and normal heart sounds.  No murmur heard. Pulmonary/Chest: Effort normal and breath sounds normal. No respiratory distress. He has no wheezes.  Abdominal:  Soft. Bowel sounds are normal. There is no tenderness. There is no rebound.  Mild abdominal scarring, no tenderness noted  Genitourinary:  Genitourinary Comments: No CVA tenderness  Musculoskeletal: He exhibits no edema.  Neurological: He is alert and oriented to person, place, and time.  Skin: Skin is warm and dry.  Psychiatric: He has a normal mood and affect.  Nursing note and vitals reviewed.    ED Treatments / Results  Labs (all labs ordered are listed, but only abnormal results are displayed) Labs Reviewed  URINALYSIS, ROUTINE W REFLEX MICROSCOPIC - Abnormal; Notable for the following components:      Result Value   Color, Urine  AMBER (*)    APPearance CLOUDY (*)    Hgb urine dipstick LARGE (*)    Protein, ur 100 (*)    Leukocytes, UA LARGE (*)    RBC / HPF >50 (*)    WBC, UA >50 (*)    All other components within normal limits  CBC - Abnormal; Notable for the following components:   RBC 3.52 (*)    Hemoglobin 11.6 (*)    HCT 36.1 (*)    MCV 102.6 (*)    All other components within normal limits  BASIC METABOLIC PANEL - Abnormal; Notable for the following components:   Glucose, Bld 143 (*)    Creatinine, Ser 1.59 (*)    GFR calc non Af Amer 36 (*)    GFR calc Af Amer 42 (*)    All other components within normal limits  URINE CULTURE    EKG None  Radiology Dg Abdomen 1 View  Result Date: 08/31/2018 CLINICAL DATA:  Hematuria EXAM: ABDOMEN - 1 VIEW COMPARISON:  CT 06/26/2016 FINDINGS: Neural stimulator pack projects over the left lower quadrant of the abdomen with leads projecting over the dorsal spine. Thoracolumbar spondylosis is identified. Numerous pill fragments are identified within the right colon and cecum. Moderate stool burden is noted within the right colon as well. No bowel obstruction is seen. There is no free air. Limited assessment of the kidneys due to overlying bowel. No no apparent genitourinary calculi. Chronic right parasymphyseal and left inferior  pubic ramus fractures with healing. IMPRESSION: 1. No definite genitourinary calculi given limitations due to overlying stool filled bowel. 2. No bowel obstruction or free air. Thoracolumbar spondylosis with neural stimulator pack and leads in place. Electronically Signed   By: Ashley Royalty M.D.   On: 08/31/2018 00:56    Procedures Procedures (including critical care time)  Medications Ordered in ED Medications  cefTRIAXone (ROCEPHIN) injection 1 g (has no administration in time range)     Initial Impression / Assessment and Plan / ED Course  I have reviewed the triage vital signs and the nursing notes.  Pertinent labs & imaging results that were available during my care of the patient were reviewed by me and considered in my medical decision making (see chart for details).     Patient presents with hematuria, dysuria, frequency.  He is overall nontoxic-appearing.  Vital signs reassuring.  He is afebrile.  Denies any back pain or systemic symptoms.  Urinalysis with greater than 50 white cells and greater than 50 red cells.  Urine culture placed.  He does not appear systemically-year-old.  No signs or symptoms of sepsis.  White count is 7.9.  Creatinine only elevated.  Patient was given IM Rocephin for presumed cystitis.  He at this time does not have any clinical manifestations of kidney stone but does have a history of kidney stones requiring nephrolithiasis.  I did obtain a screening KUB to screen for large stone.  KUB is negative.  We will treat as acute cystitis.  I discussed with patient and his wife that if he develops fevers, back pain, nausea, vomiting, any systemic symptoms he will need to be reevaluated immediately.  After history, exam, and medical workup I feel the patient has been appropriately medically screened and is safe for discharge home. Pertinent diagnoses were discussed with the patient. Patient was given return precautions.   Final Clinical Impressions(s) / ED Diagnoses     Final diagnoses:  Cystitis    ED Discharge Orders  Ordered    cephALEXin (KEFLEX) 500 MG capsule  3 times daily     08/31/18 0107           Horton, Barbette Hair, MD 08/31/18 [phone]

## 2018-09-01 LAB — URINE CULTURE: Culture: NO GROWTH

## 2018-09-14 ENCOUNTER — Ambulatory Visit (INDEPENDENT_AMBULATORY_CARE_PROVIDER_SITE_OTHER): Payer: Medicare Other | Admitting: *Deleted

## 2018-09-14 DIAGNOSIS — R001 Bradycardia, unspecified: Secondary | ICD-10-CM

## 2018-09-14 DIAGNOSIS — I495 Sick sinus syndrome: Secondary | ICD-10-CM | POA: Diagnosis not present

## 2018-09-14 NOTE — Progress Notes (Signed)
Remote pacemaker transmission.   

## 2018-09-21 ENCOUNTER — Other Ambulatory Visit: Payer: Self-pay | Admitting: Cardiology

## 2018-09-24 ENCOUNTER — Encounter (INDEPENDENT_AMBULATORY_CARE_PROVIDER_SITE_OTHER): Payer: Self-pay | Admitting: Specialist

## 2018-09-24 ENCOUNTER — Ambulatory Visit (INDEPENDENT_AMBULATORY_CARE_PROVIDER_SITE_OTHER): Payer: Medicare Other | Admitting: Specialist

## 2018-09-24 VITALS — BP 122/67 | HR 91 | Ht 70.0 in | Wt 205.0 lb

## 2018-09-24 DIAGNOSIS — R102 Pelvic and perineal pain: Secondary | ICD-10-CM

## 2018-09-24 DIAGNOSIS — M8000XG Age-related osteoporosis with current pathological fracture, unspecified site, subsequent encounter for fracture with delayed healing: Secondary | ICD-10-CM

## 2018-09-24 NOTE — Progress Notes (Signed)
Office Visit Note   Patient: Jacob Warner           Date of Birth: [birth year]           MRN: [id number] Visit Date: 09/24/2018              Requested by: [name], MD [address] [place], [place] [address] PCP: [name], MD   Assessment & Plan:Pelvis pain  Improved.  Visit Diagnoses:  1. Pain in pelvis   2. Age-related osteoporosis with current pathological fracture with delayed healing, subsequent encounter     Plan: Fall Prevention and Home Safety Falls cause injuries and can affect all age groups. It is possible to use preventive measures to significantly decrease the likelihood of falls. There are many simple measures which can make your home safer and prevent falls. OUTDOORS  Repair cracks and edges of walkways and driveways.  Remove high doorway thresholds.  Trim shrubbery on the main path into your home.  Have good outside lighting.  Clear walkways of tools, rocks, debris, and clutter.  Check that handrails are not broken and are securely fastened. Both sides of steps should have handrails.  Have leaves, snow, and ice cleared regularly.  Use sand or salt on walkways during winter months.  In the garage, clean up grease or oil spills. BATHROOM  Install night lights.  Install grab bars by the toilet and in the tub and shower.  Use non-skid mats or decals in the tub or shower.  Place a plastic non-slip stool in the shower to sit on, if needed.  Keep floors dry and clean up all water on the floor immediately.  Remove soap buildup in the tub or shower on a regular basis.  Secure bath mats with non-slip, double-sided rug tape.  Remove throw rugs and tripping hazards from the floors. BEDROOMS  Install night lights.  Make sure a bedside light is easy to reach.  Do not use oversized bedding.  Keep a telephone by your bedside.  Have a firm chair with side arms to use for getting dressed.  Remove throw rugs and tripping  hazards from the floor. KITCHEN  Keep handles on pots and pans turned toward the center of the stove. Use back burners when possible.  Clean up spills quickly and allow time for drying.  Avoid walking on wet floors.  Avoid hot utensils and knives.  Position shelves so they are not too high or low.  Place commonly used objects within easy reach.  If necessary, use a sturdy step stool with a grab bar when reaching.  Keep electrical cables out of the way.  Do not use floor polish or wax that makes floors slippery. If you must use wax, use non-skid floor wax.  Remove throw rugs and tripping hazards from the floor. STAIRWAYS  Never leave objects on stairs.  Place handrails on both sides of stairways and use them. Fix any loose handrails. Make sure handrails on both sides of the stairways are as long as the stairs.  Check carpeting to make sure it is firmly attached along stairs. Make repairs to worn or loose carpet promptly.  Avoid placing throw rugs at the top or bottom of stairways, or properly secure the rug with carpet tape to prevent slippage. Get rid of throw rugs, if possible.  Have an electrician put in a light switch at the top and bottom of the stairs. OTHER FALL PREVENTION TIPS  Wear low-heel or rubber-soled shoes  that are supportive and fit well. Wear closed toe shoes.  When using a stepladder, make sure it is fully opened and both spreaders are firmly locked. Do not climb a closed stepladder.  Add color or contrast paint or tape to grab bars and handrails in your home. Place contrasting color strips on first and last steps.  Learn and use mobility aids as needed. Install an electrical emergency response system.  Turn on lights to avoid dark areas. Replace light bulbs that burn out immediately. Get light switches that glow.  Arrange furniture to create clear pathways. Keep furniture in the same place.  Firmly attach carpet with non-skid or double-sided  tape.  Eliminate uneven floor surfaces.  Select a carpet pattern that does not visually hide the edge of steps.  Be aware of all pets. OTHER HOME SAFETY TIPS  Set the water temperature for 120 F (48.8 C).  Keep emergency numbers on or near the telephone.  Keep smoke detectors on every level of the home and near sleeping areas. Document Released: 12/05/2002 Document Revised: 06/15/2012 Document Reviewed: 03/05/2012 [facility] Patient Information 2014 [place]. Gradual return to exercising, using a stationary bicycle, pool walking and slowing building up endurance.   Follow-Up Instructions: No follow-ups on file.   Orders:  No orders of the defined types were placed in this encounter.  No orders of the defined types were placed in this encounter.     Procedures: No procedures performed   Clinical Data: No additional findings.   Subjective: Chief Complaint  Patient presents with  . Pelvis - Follow-up    84 year old male with pelvis stress fractures, he is on prolia and calcium and vitamin D for supplements. The pain in the pelvis is better and he is able to bear weight on the leg without pain. No bowel or bladder difficulty. He is taking prunes and drinking plenty of water. He uses a cane intermittantly.    Review of Systems  Constitutional: Negative.   HENT: Negative.   Eyes: Negative.   Respiratory: Negative.   Cardiovascular: Negative.   Gastrointestinal: Negative.   Endocrine: Negative.   Genitourinary: Negative.   Musculoskeletal: Negative.   Skin: Negative.   Allergic/Immunologic: Negative.   Neurological: Negative.   Hematological: Negative.   Psychiatric/Behavioral: Negative.      Objective: Vital Signs: BP 122/67 (BP Location: Left Arm, Patient Position: Sitting)   Pulse 91   Ht 5\' 10"  (1.778 m)   Wt 205 lb (93 kg)   BMI 29.41 kg/m   Physical Exam  Constitutional: He is oriented to person, place, and time. He appears well-developed  and well-nourished.  HENT:  Head: Normocephalic and atraumatic.  Eyes: Pupils are equal, round, and reactive to light. EOM are normal.  Neck: Normal range of motion. Neck supple.  Pulmonary/Chest: Effort normal and breath sounds normal.  Abdominal: Soft. Bowel sounds are normal.  Neurological: He is alert and oriented to person, place, and time.  Skin: Skin is warm and dry.  Psychiatric: He has a normal mood and affect. His behavior is normal. Judgment and thought content normal.    Back Exam   Tenderness  The patient is experiencing tenderness in the lumbar.  Range of Motion  Extension: abnormal  Flexion: abnormal  Lateral bend right: normal  Lateral bend left: normal  Rotation right: normal  Rotation left: normal   Muscle Strength  Right Quadriceps:  5/5  Left Quadriceps:  5/5  Right Hamstrings:  5/5  Left Hamstrings:  5/5   Tests  Straight leg raise right: negative Straight leg raise left: negative  Reflexes  Patellar: normal Achilles: normal Babinski's sign: normal   Other  Toe walk: normal Heel walk: normal Sensation: normal Gait: normal  Erythema: no back redness Scars: absent      Specialty Comments:  No specialty comments available.  Imaging: No results found.   PMFS History: Patient Active Problem List   Diagnosis Date Noted  . Post laminectomy syndrome 03/19/2017  . Chronic pain syndrome 03/19/2017  . Chronic bilateral low back pain without sciatica 03/19/2017  . Bradycardia 05/31/2015  . Pacemaker 05/31/2015  . Peripheral edema 05/31/2015  . Heart murmur 05/31/2015  . Sick sinus syndrome ([place]) 05/31/2015  . Nonsustained ventricular tachycardia ([place]) 05/31/2015  . 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 A-fib ([place])   . Alopecia   . Anemia   . Bradycardia   . Cancer ([place])   . Cherry angioma   . GERD (gastroesophageal reflux disease)   . Glaucoma   . Hypertension   . Pelvic fracture  ([place])   . Right BBB/left ant fasc block   . Sick sinus syndrome ([place])   . TIA (transient ischemic attack)   . Xeroderma     Family History  Problem Relation Age of Onset  . Brain cancer Sister     Past Surgical History:  Procedure Laterality Date  . APPENDECTOMY    . CERVICAL SPINE SURGERY    . EXPLORATORY LAPAROTOMY    . LITHOTRIPSY    . PACEMAKER PLACEMENT     Social History   Occupational History  . Not on file  Tobacco Use  . Smoking status: Never Smoker  . Smokeless tobacco: Never Used  Substance and Sexual Activity  . Alcohol use: No    Alcohol/week: 0.0 standard drinks  . Drug use: No  . Sexual activity: Not on file

## 2018-09-24 NOTE — Patient Instructions (Signed)
Fall Prevention and Home Safety Falls cause injuries and can affect all age groups. It is possible to use preventive measures to significantly decrease the likelihood of falls. There are many simple measures which can make your home safer and prevent falls. OUTDOORS  Repair cracks and edges of walkways and driveways.  Remove high doorway thresholds.  Trim shrubbery on the main path into your home.  Have good outside lighting.  Clear walkways of tools, rocks, debris, and clutter.  Check that handrails are not broken and are securely fastened. Both sides of steps should have handrails.  Have leaves, snow, and ice cleared regularly.  Use sand or salt on walkways during winter months.  In the garage, clean up grease or oil spills. BATHROOM  Install night lights.  Install grab bars by the toilet and in the tub and shower.  Use non-skid mats or decals in the tub or shower.  Place a plastic non-slip stool in the shower to sit on, if needed.  Keep floors dry and clean up all water on the floor immediately.  Remove soap buildup in the tub or shower on a regular basis.  Secure bath mats with non-slip, double-sided rug tape.  Remove throw rugs and tripping hazards from the floors. BEDROOMS  Install night lights.  Make sure a bedside light is easy to reach.  Do not use oversized bedding.  Keep a telephone by your bedside.  Have a firm chair with side arms to use for getting dressed.  Remove throw rugs and tripping hazards from the floor. KITCHEN  Keep handles on pots and pans turned toward the center of the stove. Use back burners when possible.  Clean up spills quickly and allow time for drying.  Avoid walking on wet floors.  Avoid hot utensils and knives.  Position shelves so they are not too high or low.  Place commonly used objects within easy reach.  If necessary, use a sturdy step stool with a grab bar when reaching.  Keep electrical cables out of the  way.  Do not use floor polish or wax that makes floors slippery. If you must use wax, use non-skid floor wax.  Remove throw rugs and tripping hazards from the floor. STAIRWAYS  Never leave objects on stairs.  Place handrails on both sides of stairways and use them. Fix any loose handrails. Make sure handrails on both sides of the stairways are as long as the stairs.  Check carpeting to make sure it is firmly attached along stairs. Make repairs to worn or loose carpet promptly.  Avoid placing throw rugs at the top or bottom of stairways, or properly secure the rug with carpet tape to prevent slippage. Get rid of throw rugs, if possible.  Have an electrician put in a light switch at the top and bottom of the stairs. OTHER FALL PREVENTION TIPS  Wear low-heel or rubber-soled shoes that are supportive and fit well. Wear closed toe shoes.  When using a stepladder, make sure it is fully opened and both spreaders are firmly locked. Do not climb a closed stepladder.  Add color or contrast paint or tape to grab bars and handrails in your home. Place contrasting color strips on first and last steps.  Learn and use mobility aids as needed. Install an electrical emergency response system.  Turn on lights to avoid dark areas. Replace light bulbs that burn out immediately. Get light switches that glow.  Arrange furniture to create clear pathways. Keep furniture in the same place.    Firmly attach carpet with non-skid or double-sided tape.  Eliminate uneven floor surfaces.  Select a carpet pattern that does not visually hide the edge of steps.  Be aware of all pets. OTHER HOME SAFETY TIPS  Set the water temperature for 120 F (48.8 C).  Keep emergency numbers on or near the telephone.  Keep smoke detectors on every level of the home and near sleeping areas. Document Released: 12/05/2002 Document Revised: 06/15/2012 Document Reviewed: 03/05/2012 [facility] Patient Information 2014  [place]. Gradual return to exercising, using a stationary bicycle, pool walking and slowing building up endurance.

## 2018-10-07 ENCOUNTER — Other Ambulatory Visit (INDEPENDENT_AMBULATORY_CARE_PROVIDER_SITE_OTHER): Payer: Self-pay | Admitting: Specialist

## 2018-10-07 NOTE — Telephone Encounter (Signed)
Gabapentin refill request 

## 2018-10-12 LAB — CUP PACEART REMOTE DEVICE CHECK
Battery Remaining Longevity: 18 mo
Battery Remaining Percentage: 32 %
Brady Statistic RA Percent Paced: 97 %
Brady Statistic RV Percent Paced: 100 %
Date Time Interrogation Session: 20190917114000
Implantable Lead Implant Date: 20130926
Implantable Lead Implant Date: 20130926
Implantable Lead Location: 753859
Implantable Lead Location: 753860
Implantable Lead Model: 4456
Implantable Lead Model: 4476
Implantable Lead Serial Number: 473023
Implantable Lead Serial Number: 523746
Implantable Pulse Generator Implant Date: 20130926
Lead Channel Impedance Value: 356 Ohm
Lead Channel Impedance Value: 413 Ohm
Lead Channel Pacing Threshold Amplitude: 0.7 V
Lead Channel Pacing Threshold Pulse Width: 0.3 ms
Lead Channel Setting Pacing Amplitude: 2 V
Lead Channel Setting Pacing Amplitude: 2.5 V
Lead Channel Setting Pacing Pulse Width: 0.4 ms
Lead Channel Setting Sensing Sensitivity: 2.5 mV
Pulse Gen Serial Number: 138586

## 2018-10-13 ENCOUNTER — Telehealth (INDEPENDENT_AMBULATORY_CARE_PROVIDER_SITE_OTHER): Payer: Self-pay | Admitting: Physical Medicine and Rehabilitation

## 2018-10-13 NOTE — Telephone Encounter (Signed)
Try trigger point

## 2018-10-13 NOTE — Telephone Encounter (Signed)
Scheduled for 10/29 at 0900.

## 2018-10-26 ENCOUNTER — Ambulatory Visit (INDEPENDENT_AMBULATORY_CARE_PROVIDER_SITE_OTHER): Payer: Self-pay

## 2018-10-26 ENCOUNTER — Ambulatory Visit (INDEPENDENT_AMBULATORY_CARE_PROVIDER_SITE_OTHER): Payer: Medicare Other | Admitting: Physical Medicine and Rehabilitation

## 2018-10-26 ENCOUNTER — Encounter (INDEPENDENT_AMBULATORY_CARE_PROVIDER_SITE_OTHER): Payer: Self-pay | Admitting: Physical Medicine and Rehabilitation

## 2018-10-26 VITALS — BP 124/67 | HR 86 | Ht 70.0 in | Wt 209.0 lb

## 2018-10-26 DIAGNOSIS — M25551 Pain in right hip: Secondary | ICD-10-CM | POA: Diagnosis not present

## 2018-10-26 DIAGNOSIS — M545 Low back pain, unspecified: Secondary | ICD-10-CM

## 2018-10-26 DIAGNOSIS — G8929 Other chronic pain: Secondary | ICD-10-CM

## 2018-10-26 DIAGNOSIS — M25552 Pain in left hip: Secondary | ICD-10-CM | POA: Diagnosis not present

## 2018-10-26 NOTE — Progress Notes (Signed)
Numeric Pain Rating Scale and Functional Assessment Average Pain 9 Pain Right Now 9 My pain is intermittent, constant, sharp and aching Pain is worse with: walking and some activites Pain improves with: rest   In the last MONTH (on 0-10 scale) has pain interfered with the following?  1. General activity like being  able to carry out your everyday physical activities such as walking, climbing stairs, carrying groceries, or moving a chair?  Rating(8)  2. Relation with others like being able to carry out your usual social activities and roles such as  activities at home, at work and in your community. Rating(8)  3. Enjoyment of life such that you have  been bothered by emotional problems such as feeling anxious, depressed or irritable?  Rating(3)

## 2018-10-26 NOTE — Progress Notes (Signed)
Jacob Warner - 84 y.o. male MRN [id number]  Date of birth: 1924/11/07  Office Visit Note: Visit Date: 10/26/2018 PCP: [name], MD Referred by: [name], MD  Subjective: Chief Complaint  Patient presents with  . Lower Back - Pain  . Right Hip - Pain  . Left Hip - Pain   HPI: Jacob Warner is a 84 y.o. male who comes in today For reevaluation of his chronic recalcitrant low back pain and bilateral hip pain.  By way of review he is mainly followed by Dr. Basil Dess in our office.  We started seeing [name] when there was contemplation of spinal cord stimulator trial.  He successfully had spinal cord stimulator trial and placement and this has helped him to a degree.  He was deemed not a surgical candidate by [name] because of his age of 70 although he does get around fairly well at 58.  We have since in the recent few months completed radiofrequency ablation of his lumbar spine and those were notes can be reviewed.  That did help his back pain quite significantly.  That was done approximately 5 months ago.  He still reports now over the last many weeks of pain referred from the low back into the lateral hip area.  No paresthesias no new injury.  No bowel or bladder dysfunction or changes.  No fever chills or night sweats or anything sinister or red flag complaints.  No focal weakness.  He still ambulates with a cane says his pain is worse with lying on one side of the other and he finds that difficult.  He denies any groin pain.  Review of Systems  Constitutional: Negative for chills, fever, malaise/fatigue and weight loss.  HENT: Negative for hearing loss and sinus pain.   Eyes: Negative for blurred vision, double vision and photophobia.  Respiratory: Negative for cough and shortness of breath.   Cardiovascular: Negative for chest pain, palpitations and leg swelling.  Gastrointestinal: Negative for abdominal pain, nausea and vomiting.  Genitourinary: Negative for flank  pain.  Musculoskeletal: Positive for back pain and joint pain. Negative for myalgias.  Skin: Negative for itching and rash.  Neurological: Negative for tremors, focal weakness and weakness.  Endo/Heme/Allergies: Negative.   Psychiatric/Behavioral: Negative for depression.  All other systems reviewed and are negative.  Otherwise per HPI.  Assessment & Plan: Visit Diagnoses:  1. Chronic bilateral low back pain without sciatica   2. Pain in left hip   3. Pain in right hip     Plan: Findings:  Chronic recalcitrant low back pain status post failure of conservative care including prior spine injections with another practitioner as well as being deemed nonsurgical candidate.  He needs medication management and physical therapy without much relief.  Ultimately had spinal cord stimulator placed which does seem to give him good relief at times.  New issue recently more pain referring to the greater trochanters bilaterally.  He is having bilateral hip pain worse with laying on each side worse with standing.  We have seen him with a history of myofascial pain as well.  Today on exam his symptoms are more consistent with bilateral greater trochanteric pain syndrome or bursitis.  Pretty exquisitely tender over the bursa's.  No pain with hip rotation internally with groin pain.  He has some pain across the lower back with myofascial pain.  We discussed strengthening and activity with him.  I think we can complete diagnostic bilateral greater trochanteric injections with  fluoroscopic guidance.  This is to the body habitus.  Depending on relief we will follow-up with him or have him see [name].  One another avenue of approach would be have Investment banker, corporate his stimulator.    Meds & Orders: No orders of the defined types were placed in this encounter.   Orders Placed This Encounter  Procedures  . Large Joint Inj: bilateral greater trochanter  . XR C-ARM NO REPORT    Follow-up: Return if  symptoms worsen or fail to improve.   Procedures: Large Joint Inj: bilateral greater trochanter on 10/26/2018 9:44 AM Indications: pain and diagnostic evaluation Details: 22 G 3.5 in needle, fluoroscopy-guided lateral approach  Arthrogram: No  Medications (Right): 6 mL bupivacaine 0.25 %; 40 mg triamcinolone acetonide 40 MG/ML Medications (Left): 6 mL bupivacaine 0.25 %; 40 mg triamcinolone acetonide 40 MG/ML Outcome: tolerated well, no immediate complications  There was excellent flow of contrast outlined the greater trochanteric bursa without vascular uptake. Procedure, treatment alternatives, risks and benefits explained, specific risks discussed. Consent was given by the patient. Immediately prior to procedure a time out was called to verify the correct patient, procedure, equipment, support staff and site/side marked as required. Patient was prepped and draped in the usual sterile fashion.      No notes on file   Clinical History: CT PELVIS WITHOUT CONTRAST  TECHNIQUE: Multidetector CT imaging of the pelvis was performed following the standard protocol without intravenous contrast.  COMPARISON:  CT of the abdomen and pelvis on 06/26/2016, plain films 07/25/2018  FINDINGS: Urinary Tract: Normal appearance of the urinary bladder and distal ureters.  Bowel: Nonobstructed bowel-gas pattern. There are numerous radiopaque tablets within the cecum and descending colon. I count approximately 25 tablets in the region of the cecum. These are primarily uniform and size, measuring approximately 1 centimeter in diameter. Findings are consistent with stasis but also raise the possibility of numerous tablets ingested at a single time.  Vascular/Lymphatic: There is dense atherosclerotic calcification of the abdominal aorta and femoral arteries. No aneurysm. No retroperitoneal or mesenteric adenopathy.  Reproductive:  Prostatic calcifications are present.  Other: There is  partially imaged soft tissue in the region of the superior mesenteric artery and vein, similar in appearance to the previous exams.  Musculoskeletal: Remote fractures of the superior and inferior pubic rami bilaterally. Remote LEFT sacral fracture. No evidence for acute fracture or subluxation. Nondisplaced fractures cannot be excluded. Small amount of stranding in the subcutaneous region of the LEFT buttock.  IMPRESSION: 1. No acute, displaced fracture. Depending on the clinical suspicion, MRI may be helpful in detecting an nondisplaced fracture. 2. Remote fractures of the LEFT sacrum, superior and inferior pubic rami bilaterally. 3. Numerous radiopaque tablets in the proximal colon. Although the findings could be related to stasis, overdose needs to be considered. 4. Persistent soft tissue in the region the superior mesenteric artery and vein. The appearance is nonspecific and stable. 5. These results were called by telephone at the time of interpretation on 07/29/2018 at 11:37 am to Dr. Basil Dess , who verbally acknowledged these results.   Electronically Signed   By: Nolon Nations M.D.   On: 07/29/2018 11:37   He reports that he has never smoked. He has never used smokeless tobacco. No results for input(s): HGBA1C, LABURIC in the last 8760 hours.  Objective:  VS:  HT:5\' 10"  (177.8 cm)   WT:209 lb (94.8 kg)  BMI:29.99    BP:124/67  HR:86bpm  TEMP: ( )  RESP:94 % Physical Exam  Constitutional: He is oriented to person, place, and time. He appears well-developed and well-nourished. No distress.  HENT:  Head: Normocephalic and atraumatic.  Eyes: Pupils are equal, round, and reactive to light. Conjunctivae are normal.  Neck: Normal range of motion. Neck supple.  Cardiovascular: Regular rhythm and intact distal pulses.  Pulmonary/Chest: Effort normal. No respiratory distress.  Musculoskeletal:  Patient ambulates with a cane.  Somewhat slow to go from sit to stand  in full extension.  He does have some pain with extension of the lumbar spine.  He does have some trigger points and tightness of the quadratus lumborum and pain over the iliac crest.  He has exquisite pain over the bilateral greater trochanters.  He has no groin pain with hip internal rotation.  Good strength distally and symmetric.  Neurological: He is alert and oriented to person, place, and time. He exhibits normal muscle tone. Coordination normal.  Skin: Skin is warm and dry. No rash noted. No erythema.  Psychiatric: He has a normal mood and affect.  Nursing note and vitals reviewed.   Ortho Exam Imaging: No results found.  Past Medical/Family/Surgical/Social History: Medications & Allergies reviewed per EMR, new medications updated. Patient Active Problem List   Diagnosis Date Noted  . Post laminectomy syndrome 03/19/2017  . Chronic pain syndrome 03/19/2017  . Chronic bilateral low back pain without sciatica 03/19/2017  . Bradycardia 05/31/2015  . Pacemaker 05/31/2015  . Peripheral edema 05/31/2015  . Heart murmur 05/31/2015  . Sick sinus syndrome ([place]) 05/31/2015  . Nonsustained ventricular tachycardia ([place]) 05/31/2015  . 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 A-fib ([place])   . Alopecia   . Anemia   . Bradycardia   . Cancer ([place])   . Cherry angioma   . GERD (gastroesophageal reflux disease)   . Glaucoma   . Hypertension   . Pelvic fracture ([place])   . Right BBB/left ant fasc block   . Sick sinus syndrome ([place])   . TIA (transient ischemic attack)   . Xeroderma    Family History  Problem Relation Age of Onset  . Brain cancer Sister    Past Surgical History:  Procedure Laterality Date  . APPENDECTOMY    . CERVICAL SPINE SURGERY    . EXPLORATORY LAPAROTOMY    . LITHOTRIPSY    . PACEMAKER PLACEMENT     Social History   Occupational History  . Not on file  Tobacco Use  . Smoking status: Never Smoker  . Smokeless  tobacco: Never Used  Substance and Sexual Activity  . Alcohol use: No    Alcohol/week: 0.0 standard drinks  . Drug use: No  . Sexual activity: Not on file

## 2018-10-26 NOTE — Patient Instructions (Signed)

## 2018-10-29 MED ORDER — TRIAMCINOLONE ACETONIDE 40 MG/ML IJ SUSP
40.0000 mg | INTRAMUSCULAR | Status: AC | PRN
  Administered 2018-10-26: 40 mg via INTRA_ARTICULAR

## 2018-10-29 MED ORDER — BUPIVACAINE HCL 0.25 % IJ SOLN
6.0000 mL | INTRAMUSCULAR | Status: AC | PRN
  Administered 2018-10-26: 6 mL via INTRA_ARTICULAR

## 2018-11-01 ENCOUNTER — Telehealth: Payer: Self-pay | Admitting: Cardiology

## 2018-11-01 NOTE — Telephone Encounter (Signed)
Spoke with pt's wife and pt is complainng of chest burning and this moves around this has been occurring for 3 days and is continuous  Unable to get B/p reading batteries are low in machine Encouraged pt's wife to take to ED for eval and tx refuses at this time is wanting  appt. Appt made for tom at 10:45 am with Richardson Dopp PA .Pt's wife aware of appt and if S/S worsen will go to ED .Adonis Housekeeper

## 2018-11-01 NOTE — Telephone Encounter (Signed)
New Message   Pt c/o of Chest Pain: STAT if CP now or developed within 24 hours  1. Are you having CP right now? yes  2. Are you experiencing any other symptoms (ex. SOB, nausea, vomiting, sweating)? Yesterday he was very dizzy. But not dizzy today  3. How long have you been experiencing CP? For about 2-3 days  4. Is your CP continuous or coming and going? continous   5. Have you taken Nitroglycerin? yes ?  Patients wife called on behalf of spouse. He describes the pain to her as a burning feeling and it its moving around in his chest

## 2018-11-02 ENCOUNTER — Encounter: Payer: Self-pay | Admitting: Physician Assistant

## 2018-11-02 ENCOUNTER — Ambulatory Visit: Payer: Medicare Other | Admitting: Physician Assistant

## 2018-11-02 VITALS — BP 112/62 | HR 77 | Ht 70.0 in | Wt 205.1 lb

## 2018-11-02 DIAGNOSIS — Z950 Presence of cardiac pacemaker: Secondary | ICD-10-CM | POA: Diagnosis not present

## 2018-11-02 DIAGNOSIS — R072 Precordial pain: Secondary | ICD-10-CM

## 2018-11-02 DIAGNOSIS — Z79899 Other long term (current) drug therapy: Secondary | ICD-10-CM | POA: Diagnosis not present

## 2018-11-02 DIAGNOSIS — I480 Paroxysmal atrial fibrillation: Secondary | ICD-10-CM

## 2018-11-02 MED ORDER — NITROGLYCERIN 0.4 MG SL SUBL: 0.4000 mg | SUBLINGUAL_TABLET | SUBLINGUAL | 5 refills | Status: DC | PRN

## 2018-11-02 NOTE — Progress Notes (Signed)
Cardiology Office Note:    Date:  11/02/2018   ID:  [name], DOB 20-Nov-1924, MRN [id number]  PCP:  [name], MD  Cardiologist:  Ena Dawley, MD   Electrophysiologist:  Cristopher Peru, MD  Oncologist:  Dr. Julien Nordmann Gastroenterologist: Dr. Watt Climes  Referring MD: [name], MD   Chief Complaint  Patient presents with  . Chest Pain    History of Present Illness:    Jacob Warner is a 84 y.o. male with sick sinus syndrome status post dual-chamber pacemaker implantation (done at [facility] in [place], [place]), prior TIA, atrial fibrillation controlled on amiodarone therapy, prior pulmonary embolism after back surgery, lymphoma status post chemotherapy.  He has not been treated with anticoagulation secondary to significant fall risk.  He was last seen by Dr. Meda Coffee in May 2019 and Dr. Lovena Le in August 2019.    Jacob Warner returns for the evaluation of chest pain.  He is here with his wife.  He has recently been evaluated by gastroenterology.  He has mainly complained of burning in his throat.  He is waiting on referral to otolaryngology.  He is currently on Protonix 40 mg daily and started this about 1 to 2 weeks ago.  Several days ago, he was at church and suddenly had burning in his chest.  It seemed to radiate from his left side to his right side.  He also had some burning in his throat.  He did not have any associated shortness of breath, nausea or diaphoresis.  He did feel dizzy.  He tried to stand up but felt near syncopal with this and had to sit back down.  Since that time, he has had occasional symptoms of burning in his chest.  This does not seem to be related to exertion at all.  It is not related to meals.  Recently, he has noted dysphagia as well as odynophagia.  He does have a history of esophageal stricture and has undergone esophageal dilation.  He has not really noted any burning in his chest related to meals.  He denies melena or hematochezia.  Prior CV studies:   The  following studies were reviewed today:  Echo 06/06/2015 Mild focal basal septal hypertrophy, EF 50-55, normal wall motion, grade 1 diastolic dysfunction, MAC, mild MR, mild LAE, normal RVSF, mild TR, trivial PI   Past Medical History:  Diagnosis Date  . A-fib ([place])   . Alopecia   . Anemia   . Bradycardia   . Cancer ([place])   . Cherry angioma   . GERD (gastroesophageal reflux disease)   . Glaucoma   . Hypertension   . Pelvic fracture ([place])   . Right BBB/left ant fasc block   . Sick sinus syndrome ([place])   . TIA (transient ischemic attack)   . Xeroderma    Surgical Hx: The patient  has a past surgical history that includes pacemaker placement; Appendectomy; Exploratory laparotomy; Cervical spine surgery; and Lithotripsy.   Current Medications: Current Meds  Medication Sig  . acetaminophen (TYLENOL) 500 MG tablet Take 1,000 mg by mouth every 6 (six) hours as needed (pain).   Marland Kitchen amiodarone (PACERONE) 200 MG tablet Take 1 tablet (200 mg total) by mouth as directed. Take one tablet by mouth Monday through Friday.  Marland Kitchen aspirin 81 MG tablet Take 81 mg by mouth daily.  . baclofen (LIORESAL) 10 MG tablet TAKE 1 TABLET BY MOUTH EVERY DAY IN THE EVENING  . Cyanocobalamin (VITAMIN B-12 PO) Take 1 tablet by mouth  daily.  . denosumab (PROLIA) 60 MG/ML SOSY injection Inject 60 mg into the skin every 6 (six) months.  . docusate sodium (COLACE) 100 MG capsule Take 100 mg by mouth daily.  Marland Kitchen doxazosin (CARDURA) 4 MG tablet Take 4 mg by mouth at bedtime.  . dutasteride (AVODART) 0.5 MG capsule Take 0.5 mg by mouth daily.  . furosemide (LASIX) 20 MG tablet Take 1 tablet (20 mg total) by mouth daily.  Marland Kitchen gabapentin (NEURONTIN) 300 MG capsule Take 300 mg by mouth every morning.   Marland Kitchen HYDROcodone-acetaminophen (NORCO) 5-325 MG tablet Take 1 tablet by mouth every 4 (four) hours as needed.  . hydroxypropyl methylcellulose / hypromellose (ISOPTO TEARS / GONIOVISC) 2.5 % ophthalmic solution Place 1 drop into both  eyes as needed for dry eyes.  Marland Kitchen lactulose (CHRONULAC) 10 GM/15ML solution Take 10 g by mouth daily.   Marland Kitchen latanoprost (XALATAN) 0.005 % ophthalmic solution PLACE 1 DROP INTO BOTH EYES AT BEDTIME  . Magnesium Oxide 400 (240 Mg) MG TABS Take 1 tablet by mouth 2 (two) times daily.  . pantoprazole (PROTONIX) 40 MG tablet Take 40 mg by mouth daily.  . RESTASIS 0.05 % ophthalmic emulsion PLACE 1 DROP INTO BOTH EYES 2 TIMES DAILY.  . traZODone (DESYREL) 50 MG tablet TAKE 1 TO 2 TABLETS AT BEDTIME AS NEEDED FOR INSOMNIA  . Vitamin D, Ergocalciferol, (DRISDOL) 50000 units CAPS capsule Take 1 capsule by mouth every Tuesday.   . White Petrolatum-Mineral Oil (EYE LUBRICANT) OINT Apply 1 strip to eye at bedtime.   Current Facility-Administered Medications for the 11/02/18 encounter (Office Visit) with Richardson Dopp T, PA-C  Medication  . methylPREDNISolone acetate (DEPO-MEDROL) injection 80 mg     Allergies:   Sulfa antibiotics and Sulfacetamide sodium   Social History   Tobacco Use  . Smoking status: Never Smoker  . Smokeless tobacco: Never Used  Substance Use Topics  . Alcohol use: No    Alcohol/week: 0.0 standard drinks  . Drug use: No     Family Hx: The patient's family history includes Brain cancer in his sister.  ROS:   Please see the history of present illness.    Review of Systems  Cardiovascular: Positive for chest pain.  Neurological: Positive for dizziness.   All other systems reviewed and are negative.   EKGs/Labs/Other Test Reviewed:    EKG:  EKG is   ordered today.  The ekg ordered today demonstrates V paced, HR 77  Recent Labs: 05/14/2018: ALT 17; TSH 2.630 08/30/2018: BUN 22; Creatinine, Ser 1.59; Hemoglobin 11.6; Platelets 186; Potassium 3.6; Sodium 138   Recent Lipid Panel No results found for: CHOL, TRIG, HDL, CHOLHDL, LDLCALC, LDLDIRECT  Physical Exam:    VS:  BP 112/62   Pulse 77   Ht _0  (1.778 m)   Wt 205 lb 1.9 oz (93 kg)   BMI 29.43 kg/m     Wt  Readings from Last 3 Encounters:  11/02/18 205 lb 1.9 oz (93 kg)  10/26/18 209 lb (94.8 kg)  09/24/18 205 lb (93 kg)     Physical Exam  Constitutional: He is oriented to person, place, and time. He appears well-developed and well-nourished. No distress.  HENT:  Head: Normocephalic and atraumatic.  Eyes: No scleral icterus.  Neck: No JVD present. No thyromegaly present.  Cardiovascular: Normal rate and regular rhythm.  No murmur heard. Pulmonary/Chest: Effort normal. He has no rales.  Abdominal: Soft. There is no tenderness.  Musculoskeletal: He exhibits no edema.  Lymphadenopathy:  He has no cervical adenopathy.  Neurological: He is alert and oriented to person, place, and time.  Skin: Skin is warm and dry.  Psychiatric: He has a normal mood and affect.    ASSESSMENT & PLAN:    Chest pain He presents with symptoms of chest discomfort described as burning.  He has also recently been treated for acid reflux and is pending referral to otolaryngology.  He is currently on proton pump inhibitor therapy.  His main symptom has recently been burning in his throat.  However, he had burning in his chest that lasted for about 30 minutes with associated dizziness several days ago.  His ECG is ventricular paced.  He does have a history of atherosclerosis on prior CT scan.  He certainly is at risk for obstructive coronary disease given his age.  However, given his advanced age, I would recommend trying to pursue a conservative approach if at all possible.  I reviewed this with Dr. Marlou Porch (attending MD) who agreed.  I have suggested that we pursue stress testing and echocardiogram initially.  If this demonstrates high risk features or he has continued/worsening symptoms, we can certainly consider whether or not to proceed with cardiac catheterization.  His blood pressure will not tolerate the addition of nitrates.  -Continue aspirin  -Prescription given for sublingual nitroglycerin as  needed   -Arrange 2D echocardiogram  -Arrange Lexiscan Myoview  -Close follow up in 2 weeks  PAF (paroxysmal atrial fibrillation) ([place]) He is not on anticoagulation secondary to history of fall risk.  Pacemaker Continue follow-up with EP as planned.  On amiodarone therapy Of note, he is not complaining of shortness of breath.  LFTs and TSH were normal in May 2019.  Dispo:  Return in about 2 weeks (around 11/16/2018) for Close Follow Up w/ Dr. Meda Coffee, or Richardson Dopp, PA-C.   Medication Adjustments/Labs and Tests Ordered: Current medicines are reviewed at length with the patient today.  Concerns regarding medicines are outlined above.  Tests Ordered: Orders Placed This Encounter  Procedures  . MYOCARDIAL PERFUSION IMAGING  . EKG 12-Lead  . ECHOCARDIOGRAM COMPLETE   Medication Changes: Meds ordered this encounter  Medications  . nitroGLYCERIN (NITROSTAT) 0.4 MG SL tablet    Sig: Place 1 tablet (0.4 mg total) under the tongue every 5 (five) minutes as needed for chest pain.    Dispense:  25 tablet    Refill:  5    Signed, Richardson Dopp, PA-C  11/02/2018 1:18 PM    [facility] [address], [place], [place]  [address] Phone: [phone]; Fax: [phone]

## 2018-11-02 NOTE — Patient Instructions (Addendum)
Medication Instructions:  Your physician has recommended you make the following change in your medication:   1. START NITROGLYCERIN 0.4 ML SUBLINGUAL TABLET AS NEEDED FOR CHEST PAIN.  If you need a refill on your cardiac medications before your next appointment, please call your pharmacy.   Lab work: NONE If you have labs (blood work) drawn today and your tests are completely normal, you will receive your results only by: Marland Kitchen MyChart Message (if you have MyChart) OR . A paper copy in the mail If you have any lab test that is abnormal or we need to change your treatment, we will call you to review the results.  Testing/Procedures: Your physician has requested that you have an echocardiogram. Echocardiography is a painless test that uses sound waves to create images of your heart. It provides your doctor with information about the size and shape of your heart and how well your heart's chambers and valves are working. This procedure takes approximately one hour. There are no restrictions for this procedure.  Your physician has requested that you have a lexiscan myoview. For further information please visit [email or web address]. Please follow instruction sheet, as given.   Follow-Up: At [facility], you and your health needs are our priority.  As part of our continuing mission to provide you with exceptional heart care, we have created designated Provider Care Teams.  These Care Teams include your primary Cardiologist (physician) and Advanced Practice Providers (APPs -  Physician Assistants and Nurse Practitioners) who all work together to provide you with the care you need, when you need it. Marland Kitchen PLEASE FOLLOW UP WITH DR. Meda Coffee OR SCOTT WEAVER, PA-C IN 2 WEEKS.   Any Other Special Instructions Will Be Listed Below (If Applicable).

## 2018-11-05 ENCOUNTER — Ambulatory Visit (INDEPENDENT_AMBULATORY_CARE_PROVIDER_SITE_OTHER): Payer: Medicare Other

## 2018-11-05 ENCOUNTER — Encounter: Payer: Self-pay | Admitting: Podiatry

## 2018-11-05 ENCOUNTER — Ambulatory Visit: Payer: Medicare Other | Admitting: Podiatry

## 2018-11-05 DIAGNOSIS — M7752 Other enthesopathy of left foot: Secondary | ICD-10-CM | POA: Diagnosis not present

## 2018-11-05 DIAGNOSIS — M779 Enthesopathy, unspecified: Secondary | ICD-10-CM

## 2018-11-05 DIAGNOSIS — M2042 Other hammer toe(s) (acquired), left foot: Secondary | ICD-10-CM | POA: Diagnosis not present

## 2018-11-05 MED ORDER — TRIAMCINOLONE ACETONIDE 10 MG/ML IJ SUSP
10.0000 mg | Freq: Once | INTRAMUSCULAR | Status: AC
  Administered 2018-11-05: 10 mg

## 2018-11-07 NOTE — Progress Notes (Signed)
Subjective:   Patient ID: Jacob Warner, male   DOB: 84 y.o.   MRN: [id number]   HPI Patient presents with severe discomfort in the left forefoot second and third metatarsals and states that he has had this for a few months and does not remember injury.  Patient is always had prominence of the metatarsal bones and does not smoke and would like to be more active   Review of Systems  All other systems reviewed and are negative.       Objective:  Physical Exam  Constitutional: He appears well-developed and well-nourished.  Cardiovascular: Intact distal pulses.  Pulmonary/Chest: Effort normal.  Musculoskeletal: Normal range of motion.  Neurological: He is alert.  Skin: Skin is warm.  Nursing note and vitals reviewed.   Neurovascular status was diminished but okay for his age with patient found to have significant elevation with rigid contracture digits 234 left with severe plantar flexion of the metatarsals and exquisite discomfort in the second and third metatarsal phalangeal joints of the left foot.  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  Today I did a proximal nerve block left I aspirated the second and third MPJs getting out a small amount of clear fluid and injected quarter cc dexamethasone Kenalog in each joint applied padding and discussed possibility of orthotics in future to try to reduce stress on the metatarsals.  Reappoint to recheck again in 2 weeks or earlier if needed  X-ray indicates her significant rigid contracture of the left digits over the right with moderate osteoporosis osteoarthritis bilateral

## 2018-11-08 ENCOUNTER — Telehealth (HOSPITAL_COMMUNITY): Payer: Self-pay | Admitting: *Deleted

## 2018-11-08 NOTE — Telephone Encounter (Signed)
Attempted to call patient regarding upcoming appointment- no answer, unable to leave a message.  Jacob Warner Jacqueline  

## 2018-11-10 DIAGNOSIS — R059 Cough, unspecified: Secondary | ICD-10-CM | POA: Insufficient documentation

## 2018-11-10 DIAGNOSIS — R05 Cough: Secondary | ICD-10-CM | POA: Insufficient documentation

## 2018-11-12 ENCOUNTER — Encounter: Payer: Self-pay | Admitting: Physician Assistant

## 2018-11-12 ENCOUNTER — Ambulatory Visit (HOSPITAL_BASED_OUTPATIENT_CLINIC_OR_DEPARTMENT_OTHER): Payer: Medicare Other

## 2018-11-12 ENCOUNTER — Ambulatory Visit (HOSPITAL_COMMUNITY): Payer: Medicare Other | Attending: Cardiovascular Disease

## 2018-11-12 ENCOUNTER — Other Ambulatory Visit: Payer: Self-pay

## 2018-11-12 DIAGNOSIS — R072 Precordial pain: Secondary | ICD-10-CM

## 2018-11-12 LAB — MYOCARDIAL PERFUSION IMAGING
Peak HR: 107 {beats}/min
Rest HR: 76 {beats}/min
SDS: 5
SRS: 4
SSS: 10
TID: 1

## 2018-11-12 MED ORDER — REGADENOSON 0.4 MG/5ML IV SOLN
0.4000 mg | Freq: Once | INTRAVENOUS | Status: AC
  Administered 2018-11-12: 0.4 mg via INTRAVENOUS

## 2018-11-12 MED ORDER — TECHNETIUM TC 99M TETROFOSMIN IV KIT
32.6000 | PACK | Freq: Once | INTRAVENOUS | Status: AC | PRN
  Administered 2018-11-12: 32.6 via INTRAVENOUS
  Filled 2018-11-12: qty 33

## 2018-11-12 MED ORDER — TECHNETIUM TC 99M TETROFOSMIN IV KIT
11.0000 | PACK | Freq: Once | INTRAVENOUS | Status: AC | PRN
  Administered 2018-11-12: 11 via INTRAVENOUS
  Filled 2018-11-12: qty 11

## 2018-11-15 ENCOUNTER — Encounter: Payer: Self-pay | Admitting: Physician Assistant

## 2018-11-16 ENCOUNTER — Ambulatory Visit: Payer: Medicare Other | Admitting: Physician Assistant

## 2018-11-16 ENCOUNTER — Encounter: Payer: Self-pay | Admitting: Physician Assistant

## 2018-11-16 VITALS — BP 132/70 | HR 74 | Ht 70.0 in | Wt 207.8 lb

## 2018-11-16 DIAGNOSIS — I951 Orthostatic hypotension: Secondary | ICD-10-CM | POA: Diagnosis not present

## 2018-11-16 DIAGNOSIS — I480 Paroxysmal atrial fibrillation: Secondary | ICD-10-CM

## 2018-11-16 DIAGNOSIS — R072 Precordial pain: Secondary | ICD-10-CM | POA: Diagnosis not present

## 2018-11-16 NOTE — Progress Notes (Signed)
Cardiology Office Note:    Date:  11/16/2018   ID:  Jacob Warner, DOB 1924/07/01, MRN [id number]  PCP:  [name], MD  Cardiologist:  Ena Dawley, MD   Electrophysiologist:  Cristopher Peru, MD   Referring MD: [name], MD   Chief Complaint  Patient presents with  . Follow-up    chest pain >> stress test, echo    History of Present Illness:    Jacob Warner is a 84 y.o. male with sick sinus syndrome status post dual-chamber pacemaker implantation (done at [facility] in [place], [place]), prior TIA, atrial fibrillation controlled on amiodarone therapy, prior pulmonary embolism after back surgery, lymphoma status post chemotherapy.  He has not been treated with anticoagulation secondary to significant fall risk.    He was last seen 11/02/2018 for evaluation of chest pain.  Recently, he has been evaluated by gastroenterology and is awaiting referral to otolaryngology.  His chest discomfort occurred while at church and was associated with near syncope.  An echocardiogram was obtained which demonstrated normal LV function and mild diastolic dysfunction.  There was mild mitral regurgitation, moderate tricuspid regurgitation and PASP 36.  A nuclear stress test demonstrated thinning with decreased tracer activity in the mid/distal inferior and apical wall c/w small region of scar and/or soft tissue attenuation.  The study was not gated.  I reviewed this with Dr. Meda Coffee who felt the study is low risk.  Since last seen, he was evaluated by Dr. Janace Hoard [facility]) and his proton pump inhibitor was increased to twice daily.     [name] returns for follow-up.  He is here today with his son.  Since last seen, he has not had any further chest pain.  He still has occasional lightheadedness with standing.  He denies syncope, paroxysmal nocturnal dyspnea.  He has not had any significant leg swelling.  He has not had any bleeding issues.  Prior CV studies:   The following studies were reviewed  today:  Nuclear stress test 11/12/2018  Thinning with decreased tracer activity in the mid/distal inferior and apical walls No change in recovery images Consistent with small region of scar and / or soft tissue attenuation. Images not gated.  REcomm echo to further defline LV function.  Echocardiogram 11/12/2018 Mild concentric LVH, EF 50-55, normal wall motion, grade 1 diastolic dysfunction, mild AI, mild MAC, mild MR, normal RVSF, moderate TR, PASP 36  Echo 06/06/2015 Mild focal basal septal hypertrophy, EF 50-55, normal wall motion, grade 1 diastolic dysfunction, MAC, mild MR, mild LAE, normal RVSF, mild TR, trivial PI  Past Medical History:  Diagnosis Date  . A-fib ([place])   . Alopecia   . Anemia   . Bradycardia   . Cancer ([place])   . Cherry angioma   . Echocardiogram    Echo 11/19: Mild concentric LVH, EF 50-55, normal wall motion, grade 1 diastolic dysfunction, mild AI, MAC, mild MR, normal RVSF, moderate TR, PASP 36  . GERD (gastroesophageal reflux disease)   . Glaucoma   . Hypertension   . Nuclear stress test    Nuclear stress test 11/19: not gated, inf and apical defect c/w small scar and/or soft tissue attenuation  . Pelvic fracture ([place])   . Right BBB/left ant fasc block   . Sick sinus syndrome ([place])   . TIA (transient ischemic attack)   . Xeroderma    Surgical Hx: The patient  has a past surgical history that includes pacemaker placement; Appendectomy; Exploratory laparotomy; Cervical spine surgery;  and Lithotripsy.   Current Medications: Current Meds  Medication Sig  . acetaminophen (TYLENOL) 500 MG tablet Take 1,000 mg by mouth every 6 (six) hours as needed (pain).   Marland Kitchen amiodarone (PACERONE) 200 MG tablet Take 1 tablet (200 mg total) by mouth as directed. Take one tablet by mouth Monday through Friday.  Marland Kitchen aspirin 81 MG tablet Take 81 mg by mouth daily.  . baclofen (LIORESAL) 10 MG tablet TAKE 1 TABLET BY MOUTH EVERY DAY IN THE EVENING  . Cyanocobalamin (VITAMIN  B-12 PO) Take 1 tablet by mouth daily.  Marland Kitchen denosumab (PROLIA) 60 MG/ML SOSY injection Inject 60 mg into the skin every 6 (six) months.  . docusate sodium (COLACE) 100 MG capsule Take 100 mg by mouth daily.  Marland Kitchen doxazosin (CARDURA) 4 MG tablet Take 4 mg by mouth at bedtime.  . dutasteride (AVODART) 0.5 MG capsule Take 0.5 mg by mouth daily.  . furosemide (LASIX) 20 MG tablet Take 1 tablet (20 mg total) by mouth daily.  Marland Kitchen gabapentin (NEURONTIN) 300 MG capsule Take 300 mg by mouth every morning.   Marland Kitchen HYDROcodone-acetaminophen (NORCO) 5-325 MG tablet Take 1 tablet by mouth every 4 (four) hours as needed.  . hydroxypropyl methylcellulose / hypromellose (ISOPTO TEARS / GONIOVISC) 2.5 % ophthalmic solution Place 1 drop into both eyes as needed for dry eyes.  Marland Kitchen lactulose (CHRONULAC) 10 GM/15ML solution Take 10 g by mouth daily.   Marland Kitchen latanoprost (XALATAN) 0.005 % ophthalmic solution PLACE 1 DROP INTO BOTH EYES AT BEDTIME  . magnesium oxide (MAG-OX) 400 (241.3 Mg) MG tablet Take 1 tablet by mouth 2 (two) times daily.  . magnesium oxide (MAG-OX) 400 MG tablet Take by mouth.  . Magnesium Oxide 400 (240 Mg) MG TABS Take 1 tablet by mouth 2 (two) times daily.  . nitroGLYCERIN (NITROSTAT) 0.4 MG SL tablet Place 1 tablet (0.4 mg total) under the tongue every 5 (five) minutes as needed for chest pain.  Marland Kitchen omeprazole (PRILOSEC) 10 MG capsule Take by mouth.  . pantoprazole (PROTONIX) 40 MG tablet Take 40 mg by mouth daily.  . predniSONE (DELTASONE) 10 MG tablet TAKE 4 TABLETS BY MOUTH EVERY DAY FOR 5 DAYS  . RESTASIS 0.05 % ophthalmic emulsion PLACE 1 DROP INTO BOTH EYES 2 TIMES DAILY.  . traZODone (DESYREL) 50 MG tablet TAKE 1 TO 2 TABLETS AT BEDTIME AS NEEDED FOR INSOMNIA  . Vitamin D, Ergocalciferol, (DRISDOL) 50000 units CAPS capsule Take 1 capsule by mouth every Tuesday.   . White Petrolatum-Mineral Oil (EYE LUBRICANT) OINT Apply 1 strip to eye at bedtime.   Current Facility-Administered Medications for the  11/16/18 encounter (Office Visit) with Richardson Dopp T, PA-C  Medication  . methylPREDNISolone acetate (DEPO-MEDROL) injection 80 mg     Allergies:   Sulfa antibiotics and Sulfacetamide sodium   Social History   Tobacco Use  . Smoking status: Never Smoker  . Smokeless tobacco: Never Used  Substance Use Topics  . Alcohol use: No    Alcohol/week: 0.0 standard drinks  . Drug use: No     Family Hx: The patient's family history includes Brain cancer in his sister.  ROS:   Please see the history of present illness.    Review of Systems  HENT: Positive for hearing loss.   Cardiovascular: Positive for leg swelling.  Neurological: Positive for dizziness and loss of balance.   All other systems reviewed and are negative.   EKGs/Labs/Other Test Reviewed:    EKG:  EKG is not ordered  today.    Recent Labs: 05/14/2018: ALT 17; TSH 2.630 08/30/2018: BUN 22; Creatinine, Ser 1.59; Hemoglobin 11.6; Platelets 186; Potassium 3.6; Sodium 138   Recent Lipid Panel No results found for: CHOL, TRIG, HDL, CHOLHDL, LDLCALC, LDLDIRECT  Physical Exam:    VS:  BP 132/70   Pulse 74   Ht 5' 10"  (1.778 m)   Wt 207 lb 12.8 oz (94.3 kg)   SpO2 93%   BMI 29.82 kg/m     Wt Readings from Last 3 Encounters:  11/16/18 207 lb 12.8 oz (94.3 kg)  11/12/18 205 lb (93 kg)  11/02/18 205 lb 1.9 oz (93 kg)     Physical Exam  Constitutional: He is oriented to person, place, and time. He appears well-developed and well-nourished. No distress.  HENT:  Head: Normocephalic and atraumatic.  Eyes: No scleral icterus.  Neck: Neck supple.  Cardiovascular: Normal rate and regular rhythm.  Pulmonary/Chest: Effort normal. He has no rales.  Abdominal: Soft.  Musculoskeletal: He exhibits no edema.  Neurological: He is alert and oriented to person, place, and time.  Skin: Skin is warm and dry.    ASSESSMENT & PLAN:    Precordial pain No recurrence since last seen.  Recent stress test was low risk.  Recent  echocardiogram demonstrated normal LV function and mild diastolic dysfunction.  No further cardiac work-up is indicated at this time.  He can follow-up with Dr. Meda Coffee in 6 months, or sooner if he has recurrent symptoms.  PAF (paroxysmal atrial fibrillation) ([place]) He is not on anticoagulation secondary to history of fall risk.  He remains on amiodarone therapy.  Orthostasis He describes some lightheadedness with standing.  He has not had syncope or near syncope.  I have suggested he try to take Lasix every other day and to wear compression stockings.  We also discussed getting up slowly from a lying or seated position.   Dispo:  Return in about 6 months (around 05/17/2019) for Routine Follow Up w/ Dr. Meda Coffee.   Medication Adjustments/Labs and Tests Ordered: Current medicines are reviewed at length with the patient today.  Concerns regarding medicines are outlined above.  Tests Ordered: No orders of the defined types were placed in this encounter.  Medication Changes: No orders of the defined types were placed in this encounter.   Signed, Richardson Dopp, PA-C  11/16/2018 12:11 PM    [facility] [address], [place], [place]  [address] Phone: [phone]; Fax: [phone]

## 2018-11-16 NOTE — Patient Instructions (Signed)
Medication Instructions:  Your physician recommends that you continue on your current medications as directed. Please refer to the Current Medication list given to you today.  If you need a refill on your cardiac medications before your next appointment, please call your pharmacy.   Lab work: NONE If you have labs (blood work) drawn today and your tests are completely normal, you will receive your results only by: . MyChart Message (if you have MyChart) OR . A paper copy in the mail If you have any lab test that is abnormal or we need to change your treatment, we will call you to review the results.  Testing/Procedures: NONE  Follow-Up: At [facility], you and your health needs are our priority.  As part of our continuing mission to provide you with exceptional heart care, we have created designated Provider Care Teams.  These Care Teams include your primary Cardiologist (physician) and Advanced Practice Providers (APPs -  Physician Assistants and Nurse Practitioners) who all work together to provide you with the care you need, when you need it. You will need a follow up appointment in 6 months.  Please call our office 2 months in advance to schedule this appointment.  You may see Katarina Nelson, MD or one of the following Advanced Practice Providers on your designated Care Team:   Brittainy Simmons, PA-C Dayna Dunn, PA-C . Michele Lenze, PA-C  Any Other Special Instructions Will Be Listed Below (If Applicable).    

## 2018-11-22 ENCOUNTER — Other Ambulatory Visit: Payer: Medicare Other | Admitting: Podiatry

## 2018-12-10 ENCOUNTER — Ambulatory Visit: Payer: Medicare Other | Admitting: Podiatry

## 2018-12-14 ENCOUNTER — Ambulatory Visit (INDEPENDENT_AMBULATORY_CARE_PROVIDER_SITE_OTHER): Payer: Medicare Other

## 2018-12-14 DIAGNOSIS — R001 Bradycardia, unspecified: Secondary | ICD-10-CM

## 2018-12-14 DIAGNOSIS — I495 Sick sinus syndrome: Secondary | ICD-10-CM | POA: Diagnosis not present

## 2018-12-14 NOTE — Progress Notes (Signed)
Remote pacemaker transmission.   

## 2018-12-15 ENCOUNTER — Encounter: Payer: Self-pay | Admitting: Cardiology

## 2018-12-16 ENCOUNTER — Encounter: Payer: Self-pay | Admitting: Podiatry

## 2018-12-16 ENCOUNTER — Ambulatory Visit: Payer: Medicare Other | Admitting: Podiatry

## 2018-12-16 DIAGNOSIS — M779 Enthesopathy, unspecified: Secondary | ICD-10-CM

## 2018-12-16 MED ORDER — TRIAMCINOLONE ACETONIDE 10 MG/ML IJ SUSP
10.0000 mg | Freq: Once | INTRAMUSCULAR | Status: AC
  Administered 2018-12-16: 10 mg

## 2018-12-17 ENCOUNTER — Encounter: Payer: Self-pay | Admitting: Cardiology

## 2018-12-17 ENCOUNTER — Ambulatory Visit (INDEPENDENT_AMBULATORY_CARE_PROVIDER_SITE_OTHER): Payer: Medicare Other | Admitting: Cardiology

## 2018-12-17 VITALS — BP 136/62 | HR 70 | Ht 70.0 in | Wt 210.6 lb

## 2018-12-17 DIAGNOSIS — I480 Paroxysmal atrial fibrillation: Secondary | ICD-10-CM | POA: Diagnosis not present

## 2018-12-17 DIAGNOSIS — I495 Sick sinus syndrome: Secondary | ICD-10-CM

## 2018-12-17 DIAGNOSIS — N390 Urinary tract infection, site not specified: Secondary | ICD-10-CM

## 2018-12-17 MED ORDER — CIPROFLOXACIN HCL 500 MG PO TABS: 500.0000 mg | ORAL_TABLET | Freq: Two times a day (BID) | ORAL | 0 refills | Status: DC

## 2018-12-17 NOTE — Progress Notes (Signed)
12/17/2018 Jacob Warner   25-Aug-1924  [id number]  Primary Physician [name], MD Primary Cardiologist: Dr. Meda Coffee  Electrophysiologist: Dr. Lovena Le   Reason for Visit/CC: 6 month f/u for SSS and PAF  HPI:  Jacob Warner is a 84 y.o. male who is being seen today for routine f/u. He established care with our practice just a few years ago. He moved to [place] from [place], [place], in 2016 to live closer to his 2 sons. He was seen initially by Dr. Mare Ferrari but transitioned to Dr. Meda Coffee following his retirement.   His cardiac history is significant for SSS. Per records, he presented to his PCP in 2013 with a heart rate of 39. He was symptomatic with his bradycardia. On 09/23/12 he underwent implantation of a Catering manager at [facility]. The site of implantation is in the right upper chest because he has the remnants of a previous Port-A-Cath in the left upper chest. The patient has a past history of having had nodular lymphoma in 2008. This was treated successfully with chemotherapy. He has been in remission. His oncologist here in [place] is Dr. Julien Nordmann. He has no documented history of IHD, nor HF. Also no history of HTN or DM. There has also been concern for PAF, thus he has been treated with low dose Amiodarone. He is on ASA only. He has a h/o falls thus not on a/c. In addition to Dr. Meda Coffee, he has also established care with Dr. Lovena Le in [place] clinic for his PPM.   In addition, he has a prior h/o PE and TIA that occurred after undergoing back surgery several years ago. His TIA resulted in some memory deficit.    05/14/2018  -patient is coming after 6 months, who felt that he is doing well he is limited in his activities by his back pain, but can still get around slowly with use of cane.  He continues to use low-dose of amiodarone, denies any worsening shortness of breath no chest pain no palpitation no dizziness syncope or falls.  No discharge from pacemaker  insertion site.    December 17, 2018 -the patient is doing well, he denies any palpitations, he gets additional orthostatic hypotension however that resolves when he sits back.  No falls.  No chest pain shortness of breath.  He has chronic lower extremity edema that is mild.  No orthopnea proximal nocturnal dyspnea.  He has frequent recurrent UTIs and developed significant burning this morning and went to the urgent care however had to leave to make this appointment after waiting for 4 hours and not being seen.  He denies any fever or chills.  Current Meds  Medication Sig  . acetaminophen (TYLENOL) 500 MG tablet Take 1,000 mg by mouth every 6 (six) hours as needed (pain).   Marland Kitchen amiodarone (PACERONE) 200 MG tablet Take 1 tablet (200 mg total) by mouth as directed. Take one tablet by mouth Monday through Friday.  Marland Kitchen aspirin 81 MG tablet Take 81 mg by mouth daily.  . Cyanocobalamin (VITAMIN B-12 PO) Take 1 tablet by mouth daily.  Marland Kitchen denosumab (PROLIA) 60 MG/ML SOSY injection Inject 60 mg into the skin every 6 (six) months.  . docusate sodium (COLACE) 100 MG capsule Take 100 mg by mouth daily.  Marland Kitchen doxazosin (CARDURA) 4 MG tablet Take 4 mg by mouth at bedtime.  . dutasteride (AVODART) 0.5 MG capsule Take 0.5 mg by mouth daily.  . furosemide (LASIX) 20 MG tablet Take 1 tablet (20 mg  total) by mouth daily.  Marland Kitchen gabapentin (NEURONTIN) 300 MG capsule Take 300 mg by mouth every morning.   Marland Kitchen HYDROcodone-acetaminophen (NORCO) 5-325 MG tablet Take 1 tablet by mouth every 4 (four) hours as needed.  . hydroxypropyl methylcellulose / hypromellose (ISOPTO TEARS / GONIOVISC) 2.5 % ophthalmic solution Place 1 drop into both eyes as needed for dry eyes.  Marland Kitchen lactulose (CHRONULAC) 10 GM/15ML solution Take 10 g by mouth daily.   Marland Kitchen latanoprost (XALATAN) 0.005 % ophthalmic solution PLACE 1 DROP INTO BOTH EYES AT BEDTIME  . magnesium oxide (MAG-OX) 400 (241.3 Mg) MG tablet Take 1 tablet by mouth 2 (two) times daily.  . magnesium  oxide (MAG-OX) 400 MG tablet Take by mouth.  . Magnesium Oxide 400 (240 Mg) MG TABS Take 1 tablet by mouth 2 (two) times daily.  . nitroGLYCERIN (NITROSTAT) 0.4 MG SL tablet Place 1 tablet (0.4 mg total) under the tongue every 5 (five) minutes as needed for chest pain.  Marland Kitchen omeprazole (PRILOSEC) 10 MG capsule Take by mouth.  . predniSONE (DELTASONE) 10 MG tablet TAKE 4 TABLETS BY MOUTH EVERY DAY FOR 5 DAYS  . RESTASIS 0.05 % ophthalmic emulsion PLACE 1 DROP INTO BOTH EYES 2 TIMES DAILY.  . traZODone (DESYREL) 50 MG tablet TAKE 1 TO 2 TABLETS AT BEDTIME AS NEEDED FOR INSOMNIA  . Vitamin D, Ergocalciferol, (DRISDOL) 50000 units CAPS capsule Take 1 capsule by mouth every Tuesday.   . White Petrolatum-Mineral Oil (EYE LUBRICANT) OINT Apply 1 strip to eye at bedtime.   Current Facility-Administered Medications for the 12/17/18 encounter (Office Visit) with [name], MD  Medication  . methylPREDNISolone acetate (DEPO-MEDROL) injection 80 mg   Allergies  Allergen Reactions  . Sulfa Antibiotics Hives and Rash    Bactrim  . Sulfacetamide Sodium Hives and Rash    Bactrim   Past Medical History:  Diagnosis Date  . A-fib ([place])   . Alopecia   . Anemia   . Bradycardia   . Cancer ([place])   . Cherry angioma   . Echocardiogram    Echo 11/19: Mild concentric LVH, EF 50-55, normal wall motion, grade 1 diastolic dysfunction, mild AI, MAC, mild MR, normal RVSF, moderate TR, PASP 36  . GERD (gastroesophageal reflux disease)   . Glaucoma   . Hypertension   . Nuclear stress test    Nuclear stress test 11/19: not gated, inf and apical defect c/w small scar and/or soft tissue attenuation  . Pelvic fracture ([place])   . Right BBB/left ant fasc block   . Sick sinus syndrome ([place])   . TIA (transient ischemic attack)   . Xeroderma    Family History  Problem Relation Age of Onset  . Brain cancer Sister    Past Surgical History:  Procedure Laterality Date  . APPENDECTOMY    . CERVICAL SPINE  SURGERY    . EXPLORATORY LAPAROTOMY    . LITHOTRIPSY    . PACEMAKER PLACEMENT     Social History   Socioeconomic History  . Marital status: Married    Spouse name: Not on file  . Number of children: Not on file  . Years of education: Not on file  . Highest education level: Not on file  Occupational History  . Not on file  Social Needs  . Financial resource strain: Not on file  . Food insecurity:    Worry: Not on file    Inability: Not on file  . Transportation needs:    Medical: Not on  file    Non-medical: Not on file  Tobacco Use  . Smoking status: Never Smoker  . Smokeless tobacco: Never Used  Substance and Sexual Activity  . Alcohol use: No    Alcohol/week: 0.0 standard drinks  . Drug use: No  . Sexual activity: Not on file  Lifestyle  . Physical activity:    Days per week: Not on file    Minutes per session: Not on file  . Stress: Not on file  Relationships  . Social connections:    Talks on phone: Not on file    Gets together: Not on file    Attends religious service: Not on file    Active member of club or organization: Not on file    Attends meetings of clubs or organizations: Not on file    Relationship status: Not on file  . Intimate partner violence:    Fear of current or ex partner: Not on file    Emotionally abused: Not on file    Physically abused: Not on file    Forced sexual activity: Not on file  Other Topics Concern  . Not on file  Social History Narrative  . Not on file     Review of Systems: General: negative for chills, fever, night sweats or weight changes.  Cardiovascular: negative for chest pain, dyspnea on exertion, edema, orthopnea, palpitations, paroxysmal nocturnal dyspnea or shortness of breath Dermatological: negative for rash Respiratory: negative for cough or wheezing Urologic: negative for hematuria Abdominal: negative for nausea, vomiting, diarrhea, bright red blood per rectum, melena, or hematemesis Neurologic: negative  for visual changes, syncope, or dizziness All other systems reviewed and are otherwise negative except as noted above.  Physical Exam:  Blood pressure 136/62, pulse 70, height _0  (1.778 m), weight 210 lb 9.6 oz (95.5 kg), SpO2 95 %.  General appearance: alert, cooperative and no distress Neck: no carotid bruit and no JVD Lungs: clear to auscultation bilaterally Heart: regular rate and rhythm, S1, S2 normal, no murmur, click, rub or gallop Extremities: mild bilateral LEE Pulses: 2+ and symmetric Skin: Skin color, texture, turgor normal. No rashes or lesions Neurologic: Grossly normal  EKG a sensed, V paced rhythm with left bundle branch block, unchanged from prior - personally reviewed    ASSESSMENT AND PLAN:   1. SSS: s/p [employer] PPM implant in 2013 at [facility]. Now followed by Dr. Lovena Le.  Functioning well.  2. PAF: maintaining NSR on low dose amiodarone. No dyspnea or visual changes. Lung exam normal. He is followed regularly by an eye doctor.  We will repeat TSH today.  He is not on anticoagulation as he is high risk for falls.  3.  UTI -we will prescribe ciprofloxacin 500 mg p.o. twice daily x5 days.  We will obtain labs today including CMP, BNP, CBC and TSH.  f/u with Dr. Meda Coffee in 6 months.    Ena Dawley PA-C, [facility] 12/17/2018 4:22 PM

## 2018-12-17 NOTE — Patient Instructions (Signed)
Medication Instructions:   START TAKING CIPROFLOXACIN 500 MG BY MOUTH TWICE DAILY FOR 5 DAYS ONLY  If you need a refill on your cardiac medications before your next appointment, please call your pharmacy.       Lab work:  TODAY--CMET, CBC W DIFF, TSH, AND PRO-BNP  If you have labs (blood work) drawn today and your tests are completely normal, you will receive your results only by: Marland Kitchen MyChart Message (if you have MyChart) OR . A paper copy in the mail If you have any lab test that is abnormal or we need to change your treatment, we will call you to review the results.      Follow-Up: At [facility], you and your health needs are our priority.  As part of our continuing mission to provide you with exceptional heart care, we have created designated Provider Care Teams.  These Care Teams include your primary Cardiologist (physician) and Advanced Practice Providers (APPs -  Physician Assistants and Nurse Practitioners) who all work together to provide you with the care you need, when you need it. You will need a follow up appointment in 6 months.  Please call our office 2 months in advance to schedule this appointment.  You may see Ena Dawley, MD or one of the following Advanced Practice Providers on your designated Care Team:   [employer], PA-C Melina Copa, PA-C . Ermalinda Barrios, PA-C

## 2018-12-18 LAB — COMPREHENSIVE METABOLIC PANEL
ALT: 12 IU/L (ref 0–44)
AST: 15 IU/L (ref 0–40)
Albumin/Globulin Ratio: 2.2 (ref 1.2–2.2)
Albumin: 4.1 g/dL (ref 3.2–4.6)
Alkaline Phosphatase: 35 IU/L — ABNORMAL LOW (ref 39–117)
BUN/Creatinine Ratio: 22 (ref 10–24)
BUN: 25 mg/dL (ref 10–36)
Bilirubin Total: 0.3 mg/dL (ref 0.0–1.2)
CO2: 27 mmol/L (ref 20–29)
Calcium: 9 mg/dL (ref 8.6–10.2)
Chloride: 103 mmol/L (ref 96–106)
Creatinine, Ser: 1.14 mg/dL (ref 0.76–1.27)
GFR calc Af Amer: 64 mL/min/{1.73_m2} (ref >59)
GFR calc non Af Amer: 56 mL/min/{1.73_m2} — ABNORMAL LOW (ref >59)
Globulin, Total: 1.9 g/dL (ref 1.5–4.5)
Glucose: 107 mg/dL — ABNORMAL HIGH (ref 65–99)
Potassium: 4.3 mmol/L (ref 3.5–5.2)
Sodium: 145 mmol/L — ABNORMAL HIGH (ref 134–144)
Total Protein: 6 g/dL (ref 6.0–8.5)

## 2018-12-18 LAB — CBC WITH DIFFERENTIAL/PLATELET
Basophils Absolute: 0 10*3/uL (ref 0.0–0.2)
Basos: 0 %
EOS (ABSOLUTE): 0.1 10*3/uL (ref 0.0–0.4)
Eos: 1 %
Hematocrit: 33.2 % — ABNORMAL LOW (ref 37.5–51.0)
Hemoglobin: 10.9 g/dL — ABNORMAL LOW (ref 13.0–17.7)
Immature Grans (Abs): 0 10*3/uL (ref 0.0–0.1)
Immature Granulocytes: 0 %
Lymphocytes Absolute: 0.9 10*3/uL (ref 0.7–3.1)
Lymphs: 12 %
MCH: 31.9 pg (ref 26.6–33.0)
MCHC: 32.8 g/dL (ref 31.5–35.7)
MCV: 97 fL (ref 79–97)
Monocytes Absolute: 0.5 10*3/uL (ref 0.1–0.9)
Monocytes: 7 %
Neutrophils Absolute: 6 10*3/uL (ref 1.4–7.0)
Neutrophils: 80 %
Platelets: 189 10*3/uL (ref 150–450)
RBC: 3.42 x10E6/uL — ABNORMAL LOW (ref 4.14–5.80)
RDW: 13.7 % (ref 12.3–15.4)
WBC: 7.6 10*3/uL (ref 3.4–10.8)

## 2018-12-18 LAB — TSH: TSH: 1.28 u[IU]/mL (ref 0.450–4.500)

## 2018-12-18 LAB — PRO B NATRIURETIC PEPTIDE: NT-Pro BNP: 293 pg/mL (ref 0–486)

## 2018-12-18 NOTE — Progress Notes (Signed)
Subjective:   Patient ID: Jacob Warner, male   DOB: 84 y.o.   MRN: [id number]   HPI Patient presents stating he has pain in the outside of the left foot and it is inflamed and is not sure what the cause   ROS      Objective:  Physical Exam  Neurovascular status intact with inflammation of the fifth MPJ left with fluid buildup around the joint that is localized     Assessment:  Inflammatory capsulitis in addition to diminished fat pad and bone exposure     Plan:  We are in a try to use injection therapy we may need to make a soft thick orthotic but he is wearing over-the-counter cushioned insoles now.  I did a sterile prep left lateral foot I did carefully inject around the joint 2 mg Dexasone Kenalog 5 mg Xylocaine padding the area and advised if symptoms persist we will have to consider other treatments to try to help reduce his symptoms

## 2018-12-29 DIAGNOSIS — I5030 Unspecified diastolic (congestive) heart failure: Secondary | ICD-10-CM

## 2018-12-29 HISTORY — DX: Unspecified diastolic (congestive) heart failure: I50.30

## 2019-01-16 LAB — CUP PACEART REMOTE DEVICE CHECK
Battery Remaining Longevity: 18 mo
Battery Remaining Percentage: 27 %
Brady Statistic RA Percent Paced: 98 %
Brady Statistic RV Percent Paced: 100 %
Date Time Interrogation Session: 20191217110900
Implantable Lead Implant Date: 20130926
Implantable Lead Implant Date: 20130926
Implantable Lead Location: 753859
Implantable Lead Location: 753860
Implantable Lead Model: 4456
Implantable Lead Model: 4476
Implantable Lead Serial Number: 473023
Implantable Lead Serial Number: 523746
Implantable Pulse Generator Implant Date: 20130926
Lead Channel Impedance Value: 371 Ohm
Lead Channel Impedance Value: 458 Ohm
Lead Channel Pacing Threshold Amplitude: 0.7 V
Lead Channel Pacing Threshold Pulse Width: 0.3 ms
Lead Channel Setting Pacing Amplitude: 2 V
Lead Channel Setting Pacing Amplitude: 2.5 V
Lead Channel Setting Pacing Pulse Width: 0.4 ms
Lead Channel Setting Sensing Sensitivity: 2.5 mV
Pulse Gen Serial Number: 138586

## 2019-03-15 ENCOUNTER — Other Ambulatory Visit: Payer: Self-pay

## 2019-03-15 ENCOUNTER — Ambulatory Visit (INDEPENDENT_AMBULATORY_CARE_PROVIDER_SITE_OTHER): Payer: Medicare Other | Admitting: *Deleted

## 2019-03-15 DIAGNOSIS — I495 Sick sinus syndrome: Secondary | ICD-10-CM | POA: Diagnosis not present

## 2019-03-16 LAB — CUP PACEART REMOTE DEVICE CHECK
Battery Remaining Longevity: 12 mo
Battery Remaining Percentage: 22 %
Brady Statistic RA Percent Paced: 99 %
Brady Statistic RV Percent Paced: 100 %
Date Time Interrogation Session: 20200317044500
Implantable Lead Implant Date: 20130926
Implantable Lead Implant Date: 20130926
Implantable Lead Location: 753859
Implantable Lead Location: 753860
Implantable Lead Model: 4456
Implantable Lead Model: 4476
Implantable Lead Serial Number: 473023
Implantable Lead Serial Number: 523746
Implantable Pulse Generator Implant Date: 20130926
Lead Channel Impedance Value: 360 Ohm
Lead Channel Impedance Value: 453 Ohm
Lead Channel Pacing Threshold Amplitude: 0.7 V
Lead Channel Pacing Threshold Pulse Width: 0.3 ms
Lead Channel Setting Pacing Amplitude: 2 V
Lead Channel Setting Pacing Amplitude: 2.5 V
Lead Channel Setting Pacing Pulse Width: 0.4 ms
Lead Channel Setting Sensing Sensitivity: 2.5 mV
Pulse Gen Serial Number: 138586

## 2019-03-23 ENCOUNTER — Encounter: Payer: Self-pay | Admitting: Cardiology

## 2019-03-23 NOTE — Progress Notes (Signed)
Remote pacemaker transmission.   

## 2019-04-20 ENCOUNTER — Telehealth: Payer: Self-pay

## 2019-04-20 NOTE — Telephone Encounter (Signed)

## 2019-04-25 NOTE — Progress Notes (Signed)
Virtual Visit via Video Note   This visit type was conducted due to national recommendations for restrictions regarding the COVID-19 Pandemic (e.g. social distancing) in an effort to limit this patient's exposure and mitigate transmission in our community.  Due to his co-morbid illnesses, this patient is at least at moderate risk for complications without adequate follow up.  This format is felt to be most appropriate for this patient at this time.  All issues noted in this document were discussed and addressed.  A limited physical exam was performed with this format.  Please refer to the patient's chart for his consent to telehealth for [facility]. -Had to be converted to phone visit as patient was unable to connect after several attempts and 8 minutes of trying  Evaluation Performed:  Follow-up visit  Date:  04/26/2019   ID:  Arbie, Blankley [birth year], MRN [id number]  Patient Location: Home Provider Location: Home  PCP:  [name], MD  Cardiologist:  Ena Dawley, MD  Electrophysiologist:  Cristopher Peru, MD   Chief Complaint:  F/u   History of Present Illness:    Jacob Warner is a 85 y.o. male with history of sick sinus syndrome status post Scissors dual-chamber pacemaker at [employer] in 2013.  Also has PAF on amiodarone and aspirin.  He does have a history of falls so not on anticoagulation.  No history of hypertension, CAD, DM.  He does have a history of a TIA and PE after undergoing back surgery in the past.  Patient last seen in the office 12/17/2018 by Dr. Meda Coffee and was doing well and maintaining normal sinus rhythm.  Nuclear stress test 10/2018 thinning with decreased tracer activity in the mid distal inferior and apical walls no change in recovery images.  Consistent with small region of scar and/or soft tissue attenuation.  Echo showed normal LVEF 50 to 55% with mild concentric LVH and grade 1 DD, mild AI, moderate TR. Last seen by Dr. Lovena Le 07/2018 and  doing well,pacer functioning normally.  Denies chest pain, shortness of breath, dizziness, palpitations, or presyncope. Walks about a city block daily in 15 min to get his mail.[place] has a little ankle edema from neuropathy he says but stable and the same.    The patient does not have symptoms concerning for COVID-19 infection (fever, chills, cough, or new shortness of breath).    Past Medical History:  Diagnosis Date  . A-fib ([place])   . Alopecia   . Anemia   . Bradycardia   . Cancer ([place])   . Cherry angioma   . Echocardiogram    Echo 11/19: Mild concentric LVH, EF 50-55, normal wall motion, grade 1 diastolic dysfunction, mild AI, MAC, mild MR, normal RVSF, moderate TR, PASP 36  . GERD (gastroesophageal reflux disease)   . Glaucoma   . Hypertension   . Nuclear stress test    Nuclear stress test 11/19: not gated, inf and apical defect c/w small scar and/or soft tissue attenuation  . Pelvic fracture ([place])   . Right BBB/left ant fasc block   . Sick sinus syndrome ([place])   . TIA (transient ischemic attack)   . Xeroderma    Past Surgical History:  Procedure Laterality Date  . APPENDECTOMY    . CERVICAL SPINE SURGERY    . EXPLORATORY LAPAROTOMY    . LITHOTRIPSY    . PACEMAKER PLACEMENT       Current Meds  Medication Sig  . acetaminophen (TYLENOL) 500 MG tablet  Take 1,000 mg by mouth every 6 (six) hours as needed (pain).   Marland Kitchen amiodarone (PACERONE) 200 MG tablet Take 1 tablet (200 mg total) by mouth as directed. Take one tablet by mouth Monday through Friday.  Marland Kitchen aspirin 81 MG tablet Take 81 mg by mouth daily.  . ciprofloxacin (CIPRO) 500 MG tablet Take 1 tablet (500 mg total) by mouth 2 (two) times daily. Take for 5 days only.  . Cyanocobalamin (VITAMIN B-12 PO) Take 1 tablet by mouth daily.  Marland Kitchen denosumab (PROLIA) 60 MG/ML SOSY injection Inject 60 mg into the skin every 6 (six) months.  . docusate sodium (COLACE) 100 MG capsule Take 100 mg by mouth daily.  Marland Kitchen doxazosin (CARDURA) 4  MG tablet Take 4 mg by mouth at bedtime.  . dutasteride (AVODART) 0.5 MG capsule Take 0.5 mg by mouth daily.  . furosemide (LASIX) 20 MG tablet Take 1 tablet (20 mg total) by mouth daily.  Marland Kitchen gabapentin (NEURONTIN) 300 MG capsule Take 300 mg by mouth every morning.   Marland Kitchen HYDROcodone-acetaminophen (NORCO) 5-325 MG tablet Take 1 tablet by mouth every 4 (four) hours as needed.  . hydroxypropyl methylcellulose / hypromellose (ISOPTO TEARS / GONIOVISC) 2.5 % ophthalmic solution Place 1 drop into both eyes as needed for dry eyes.  Marland Kitchen lactulose (CHRONULAC) 10 GM/15ML solution Take 10 g by mouth daily.   Marland Kitchen latanoprost (XALATAN) 0.005 % ophthalmic solution PLACE 1 DROP INTO BOTH EYES AT BEDTIME  . magnesium oxide (MAG-OX) 400 (241.3 Mg) MG tablet Take 1 tablet by mouth 2 (two) times daily.  . magnesium oxide (MAG-OX) 400 MG tablet Take by mouth.  . Magnesium Oxide 400 (240 Mg) MG TABS Take 1 tablet by mouth 2 (two) times daily.  Marland Kitchen omeprazole (PRILOSEC) 10 MG capsule Take by mouth.  . predniSONE (DELTASONE) 10 MG tablet TAKE 4 TABLETS BY MOUTH EVERY DAY FOR 5 DAYS  . RESTASIS 0.05 % ophthalmic emulsion PLACE 1 DROP INTO BOTH EYES 2 TIMES DAILY.  . traZODone (DESYREL) 50 MG tablet TAKE 1 TO 2 TABLETS AT BEDTIME AS NEEDED FOR INSOMNIA  . Vitamin D, Ergocalciferol, (DRISDOL) 50000 units CAPS capsule Take 1 capsule by mouth every Tuesday.   . White Petrolatum-Mineral Oil (EYE LUBRICANT) OINT Apply 1 strip to eye at bedtime.   Current Facility-Administered Medications for the 04/26/19 encounter (Telemedicine) with Jacob Burn, PA-C  Medication  . methylPREDNISolone acetate (DEPO-MEDROL) injection 80 mg     Allergies:   Sulfa antibiotics and Sulfacetamide sodium   Social History   Tobacco Use  . Smoking status: Never Smoker  . Smokeless tobacco: Never Used  Substance Use Topics  . Alcohol use: No    Alcohol/week: 0.0 standard drinks  . Drug use: No     Family Hx: The patient's family history  includes Brain cancer in his sister.  ROS:   Please see the history of present illness.    Review of Systems  Constitution: Negative.  HENT: Negative.   Cardiovascular: Negative.   Respiratory: Negative.   Endocrine: Negative.   Hematologic/Lymphatic: Negative.   Musculoskeletal: Positive for arthritis.  Gastrointestinal: Negative.   Genitourinary: Negative.   Neurological: Negative.        Neuropathy    All other systems reviewed and are negative.   Prior CV studies:   The following studies were reviewed today:  NST 11/2019Study Highlights    Thinning with decreased tracer activity in the mid/distal inferior and apical walls No change in recovery images Consistent with  small region of scar and / or soft tissue attenuation. Images not gated.  REcomm echo to further defline LV function.     Echo 10/2018 Study Conclusions   - Left ventricle: The cavity size was normal. There was mild   concentric hypertrophy. Systolic function was normal. The   estimated ejection fraction was in the range of 50% to 55%. Wall   motion was normal; there were no regional wall motion   abnormalities. Doppler parameters are consistent with abnormal   left ventricular relaxation (grade 1 diastolic dysfunction).   Doppler parameters are consistent with indeterminate ventricular   filling pressure. - Ventricular septum: Septal motion showed dyssynchrony and bounce,   consistent with ventricular pacing. - Aortic valve: Valve mobility was restricted. Transvalvular   velocity was within the normal range. There was no stenosis.   There was mild regurgitation. - Mitral valve: Mildly calcified annulus. Mildly thickened leaflets   . Transvalvular velocity was within the normal range. There was   no evidence for stenosis. There was mild regurgitation. - Right ventricle: The cavity size was normal. Wall thickness was   normal. Systolic function was normal. - Atrial septum: No defect or patent  foramen ovale was identified. - Tricuspid valve: There was moderate regurgitation. - Pulmonary arteries: Systolic pressure was within the normal   range. PA peak pressure: 36 mm Hg (S).    Labs/Other Tests and Data Reviewed:    EKG:  An ECG dated 11/02/2018 was personally reviewed today and demonstrated:  Normal sinus rhythm, paced  Recent Labs: 12/17/2018: ALT 12; BUN 25; Creatinine, Ser 1.14; Hemoglobin 10.9; NT-Pro BNP 293; Platelets 189; Potassium 4.3; Sodium 145; TSH 1.280   Recent Lipid Panel No results found for: CHOL, TRIG, HDL, CHOLHDL, LDLCALC, LDLDIRECT  Wt Readings from Last 3 Encounters:  04/26/19 209 lb (94.8 kg)  12/17/18 210 lb 9.6 oz (95.5 kg)  11/16/18 207 lb 12.8 oz (94.3 kg)     Objective:    Vital Signs:  BP (!) 144/79   Ht _0  (1.778 m)   Wt 209 lb (94.8 kg)   BMI 29.99 kg/m    Couldn't examine patient because had to be converted to telephone visit after several attempts.  ASSESSMENT & PLAN:    1. Sick sinus syndrome status post [employer] pacemaker and [employer] in 2013 followed by Dr. Elenore Paddy with him 07/2019. Remote checks stable 2. PAF on amiodarone and aspirin.  No anticoagulation because of high risk fall. On Amiodarone 200 mg Mon-Fri, none on Sat/Sun surveillance labs 11/2018 stable including LFT's, TSH, CBC & CMET. Hasn't had a CXR since 2016. Will need one when he comes back to see Dr. Meda Coffee in 6 months.  COVID-19 Education: The signs and symptoms of COVID-19 were discussed with the patient and how to seek care for testing (follow up with PCP or arrange E-visit).   The importance of social distancing was discussed today.  Time:   Today, I have spent  15 minutes with the patient with telehealth technology discussing the above problems.     Medication Adjustments/Labs and Tests Ordered: Current medicines are reviewed at length with the patient today.  Concerns regarding medicines are outlined above.   Tests Ordered: No orders of the  defined types were placed in this encounter.   Medication Changes: No orders of the defined types were placed in this encounter.   Disposition:  Follow up in 6 month(s) Dr. Meda Coffee with CXR on amiodarone. Dr. Lovena Le in August  Signed,  Jacob Barrios, PA-C  04/26/2019 9:53 AM    [facility]

## 2019-04-26 ENCOUNTER — Other Ambulatory Visit: Payer: Self-pay

## 2019-04-26 ENCOUNTER — Telehealth (INDEPENDENT_AMBULATORY_CARE_PROVIDER_SITE_OTHER): Payer: Medicare Other | Admitting: Physician Assistant

## 2019-04-26 ENCOUNTER — Encounter: Payer: Self-pay | Admitting: Physician Assistant

## 2019-04-26 VITALS — BP 144/79 | Ht 70.0 in | Wt 209.0 lb

## 2019-04-26 DIAGNOSIS — I495 Sick sinus syndrome: Secondary | ICD-10-CM

## 2019-04-26 DIAGNOSIS — Z79899 Other long term (current) drug therapy: Secondary | ICD-10-CM

## 2019-04-26 DIAGNOSIS — I480 Paroxysmal atrial fibrillation: Secondary | ICD-10-CM

## 2019-04-26 NOTE — Patient Instructions (Addendum)
Medication Instructions:  Your physician recommends that you continue on your current medications as directed. Please refer to the Current Medication list given to you today.  If you need a refill on your cardiac medications before your next appointment, please call your pharmacy.   Lab work: None Ordered  If you have labs (blood work) drawn today and your tests are completely normal, you will receive your results only by: Marland Kitchen MyChart Message (if you have MyChart) OR . A paper copy in the mail If you have any lab test that is abnormal or we need to change your treatment, we will call you to review the results.  Testing/Procedures: Your provider wants you take have a chest x-ray in 6 months when you come back to see Dr. Meda Coffee. A chest x-ray takes a picture of the organs and structures inside the chest, including the heart, lungs, and blood vessels. This test can show several things, including, whether the heart is enlarges; whether fluid is building up in the lungs; and whether pacemaker / defibrillator leads are still in place.   Follow-Up: Follow up with Dr. Lovena Le in August. You will be contacted to schedule the appointment.   At [facility], you and your health needs are our priority.  As part of our continuing mission to provide you with exceptional heart care, we have created designated Provider Care Teams.  These Care Teams include your primary Cardiologist (physician) and Advanced Practice Providers (APPs -  Physician Assistants and Nurse Practitioners) who all work together to provide you with the care you need, when you need it. . You will need a follow up appointment in 6 months.  Please call our office 2 months in advance to schedule this appointment.  You may see Ena Dawley, MD or one of the following Advanced Practice Providers on your designated Care Team:   . Lyda Jester, PA-C . Dayna Dunn, PA-C . Ermalinda Barrios, PA-C  Any Other Special Instructions Will Be Listed  Below (If Applicable).

## 2019-05-16 ENCOUNTER — Telehealth: Payer: Self-pay | Admitting: Physical Medicine and Rehabilitation

## 2019-05-16 NOTE — Telephone Encounter (Signed)
If they feel like the last injection helped and its very similar pain you can go ahead and repeat that and then we could discuss repeating the ablation.

## 2019-05-16 NOTE — Telephone Encounter (Signed)
Patient's wife reports that the pain is on one side in the area of his SCS battery. I have scheduled him for our next available OV in early June. Please advise if I need to schedule for anything else.

## 2019-05-17 NOTE — Telephone Encounter (Signed)
It seems to always be there, it has been looked at by [facility] but if they felt it was related to battery then could call him as well.

## 2019-06-01 ENCOUNTER — Ambulatory Visit (INDEPENDENT_AMBULATORY_CARE_PROVIDER_SITE_OTHER): Payer: Medicare Other | Admitting: Physical Medicine and Rehabilitation

## 2019-06-01 ENCOUNTER — Encounter: Payer: Self-pay | Admitting: Physical Medicine and Rehabilitation

## 2019-06-01 ENCOUNTER — Other Ambulatory Visit: Payer: Self-pay

## 2019-06-01 VITALS — BP 116/58 | HR 76 | Ht 70.0 in | Wt 211.0 lb

## 2019-06-01 DIAGNOSIS — G8929 Other chronic pain: Secondary | ICD-10-CM | POA: Diagnosis not present

## 2019-06-01 DIAGNOSIS — M47816 Spondylosis without myelopathy or radiculopathy, lumbar region: Secondary | ICD-10-CM

## 2019-06-01 DIAGNOSIS — G894 Chronic pain syndrome: Secondary | ICD-10-CM

## 2019-06-01 DIAGNOSIS — M545 Low back pain, unspecified: Secondary | ICD-10-CM

## 2019-06-01 DIAGNOSIS — M961 Postlaminectomy syndrome, not elsewhere classified: Secondary | ICD-10-CM

## 2019-06-01 DIAGNOSIS — M7918 Myalgia, other site: Secondary | ICD-10-CM

## 2019-06-01 NOTE — Progress Notes (Signed)
 .  Numeric Pain Rating Scale and Functional Assessment Average Pain 8 Pain Right Now 8 My pain is constant and sharp Pain is worse with: walking and sitting Pain improves with: rest   In the last MONTH (on 0-10 scale) has pain interfered with the following?  1. General activity like being  able to carry out your everyday physical activities such as walking, climbing stairs, carrying groceries, or moving a chair?  Rating(8)  2. Relation with others like being able to carry out your usual social activities and roles such as  activities at home, at work and in your community. Rating(8)  3. Enjoyment of life such that you have  been bothered by emotional problems such as feeling anxious, depressed or irritable?  Rating(6)

## 2019-06-14 ENCOUNTER — Ambulatory Visit (INDEPENDENT_AMBULATORY_CARE_PROVIDER_SITE_OTHER): Payer: Medicare Other | Admitting: *Deleted

## 2019-06-14 DIAGNOSIS — I495 Sick sinus syndrome: Secondary | ICD-10-CM

## 2019-06-14 LAB — CUP PACEART REMOTE DEVICE CHECK
Battery Remaining Longevity: 11 mo
Battery Remaining Percentage: 16 %
Brady Statistic RA Percent Paced: 99 %
Brady Statistic RV Percent Paced: 100 %
Date Time Interrogation Session: 20200616100800
Implantable Lead Implant Date: 20130926
Implantable Lead Implant Date: 20130926
Implantable Lead Location: 753859
Implantable Lead Location: 753860
Implantable Lead Model: 4456
Implantable Lead Model: 4476
Implantable Lead Serial Number: 473023
Implantable Lead Serial Number: 523746
Implantable Pulse Generator Implant Date: 20130926
Lead Channel Impedance Value: 345 Ohm
Lead Channel Impedance Value: 403 Ohm
Lead Channel Pacing Threshold Amplitude: 0.7 V
Lead Channel Pacing Threshold Pulse Width: 0.3 ms
Lead Channel Setting Pacing Amplitude: 2 V
Lead Channel Setting Pacing Amplitude: 2.5 V
Lead Channel Setting Pacing Pulse Width: 0.4 ms
Lead Channel Setting Sensing Sensitivity: 2.5 mV
Pulse Gen Serial Number: 138586

## 2019-06-21 ENCOUNTER — Other Ambulatory Visit: Payer: Self-pay

## 2019-06-21 ENCOUNTER — Ambulatory Visit (INDEPENDENT_AMBULATORY_CARE_PROVIDER_SITE_OTHER): Payer: Medicare Other | Admitting: Physical Medicine and Rehabilitation

## 2019-06-21 ENCOUNTER — Ambulatory Visit: Payer: Self-pay

## 2019-06-21 VITALS — BP 134/66 | HR 89 | Temp 98.2°F

## 2019-06-21 DIAGNOSIS — M47816 Spondylosis without myelopathy or radiculopathy, lumbar region: Secondary | ICD-10-CM

## 2019-06-21 MED ORDER — METHYLPREDNISOLONE ACETATE 80 MG/ML IJ SUSP
80.0000 mg | Freq: Once | INTRAMUSCULAR | Status: AC
  Administered 2019-06-21: 80 mg

## 2019-06-21 NOTE — Progress Notes (Signed)
 .  Numeric Pain Rating Scale and Functional Assessment Average Pain 9   In the last MONTH (on 0-10 scale) has pain interfered with the following?  1. General activity like being  able to carry out your everyday physical activities such as walking, climbing stairs, carrying groceries, or moving a chair?  Rating(9)   +Driver, -BT, -Dye Allergies.  

## 2019-06-25 ENCOUNTER — Encounter: Payer: Self-pay | Admitting: Cardiology

## 2019-06-25 NOTE — Progress Notes (Signed)
Remote pacemaker transmission.   

## 2019-06-28 ENCOUNTER — Encounter: Payer: Self-pay | Admitting: Physical Medicine and Rehabilitation

## 2019-06-28 ENCOUNTER — Other Ambulatory Visit: Payer: Self-pay

## 2019-06-28 ENCOUNTER — Ambulatory Visit: Payer: Self-pay

## 2019-06-28 ENCOUNTER — Ambulatory Visit (INDEPENDENT_AMBULATORY_CARE_PROVIDER_SITE_OTHER): Payer: Medicare Other | Admitting: Physical Medicine and Rehabilitation

## 2019-06-28 VITALS — BP 118/64 | HR 83

## 2019-06-28 DIAGNOSIS — M47816 Spondylosis without myelopathy or radiculopathy, lumbar region: Secondary | ICD-10-CM | POA: Diagnosis not present

## 2019-06-28 NOTE — Progress Notes (Signed)
 .  Numeric Pain Rating Scale and Functional Assessment Average Pain 7   In the last MONTH (on 0-10 scale) has pain interfered with the following?  1. General activity like being  able to carry out your everyday physical activities such as walking, climbing stairs, carrying groceries, or moving a chair?  Rating(4)   +Driver, -BT, -Dye Allergies.  

## 2019-06-29 DIAGNOSIS — M47816 Spondylosis without myelopathy or radiculopathy, lumbar region: Secondary | ICD-10-CM | POA: Diagnosis not present

## 2019-06-29 MED ORDER — METHYLPREDNISOLONE ACETATE 80 MG/ML IJ SUSP
80.0000 mg | Freq: Once | INTRAMUSCULAR | Status: AC
  Administered 2019-06-29: 80 mg

## 2019-06-29 NOTE — Procedures (Signed)
Lumbar Facet Joint Nerve Denervation  Patient: Jacob Warner      Date of Birth: 03-Jul-1924 MRN: [id number] PCP: [name], MD      Visit Date: 06/28/2019   Universal Protocol:    Date/Time: 07/01/208:21 AM  Consent Given By: the patient  Position: PRONE  Additional Comments: Vital signs were monitored before and after the procedure. Patient was prepped and draped in the usual sterile fashion. The correct patient, procedure, and site was verified.   Injection Procedure Details:  Procedure Site One Meds Administered:  Meds ordered this encounter  Medications  . methylPREDNISolone acetate (DEPO-MEDROL) injection 80 mg     Laterality: Right  Location/Site:  L4-L5 L5-S1  Needle size: 18 G  Needle type: Radiofrequency cannula  Needle Placement: Along juncture of superior articular process and transverse pocess  Findings:  -Comments:  Procedure Details: For each desired target nerve, the corresponding transverse process (sacral ala for the L5 dorsal rami) was identified and the fluoroscope was positioned to square off the endplates of the corresponding vertebral body to achieve a true AP midline view.  The beam was then obliqued 15 to 20 degrees and caudally tilted 15 to 20 degrees to line up a trajectory along the target nerves. The skin over the target of the junction of superior articulating process and transverse process (sacral ala for the L5 dorsal rami) was infiltrated with 4ml of 1% Lidocaine without Epinephrine.  The 18 gauge 29mm active tip outer cannula was advanced in trajectory view to the target.  This procedure was repeated for each target nerve.  Then, for all levels, the outer cannula placement was fine-tuned and the position was then confirmed with bi-planar imaging.    Test stimulation was done both at sensory and motor levels to ensure there was no radicular stimulation. The target tissues were then infiltrated with 1 ml of 1% Lidocaine without  Epinephrine. Subsequently, a percutaneous neurotomy was carried out for 90 seconds at 80 degrees Celsius.  After the completion of the lesion, 1 ml of injectate was delivered. It was then repeated for each facet joint nerve mentioned above. Appropriate radiographs were obtained to verify the probe placement during the neurotomy.   Additional Comments:  The patient tolerated the procedure well Dressing: 2 x 2 sterile gauze and Band-Aid    Post-procedure details: Patient was observed during the procedure. Post-procedure instructions were reviewed.  Patient left the clinic in stable condition.

## 2019-06-29 NOTE — Procedures (Signed)
Lumbar Facet Joint Nerve Denervation  Patient: Jacob Warner      Date of Birth: 1924-07-13 MRN: [id number] PCP: [name], MD      Visit Date: 06/21/2019   Universal Protocol:    Date/Time: 07/01/208:23 AM  Consent Given By: the patient  Position: PRONE  Additional Comments: Vital signs were monitored before and after the procedure. Patient was prepped and draped in the usual sterile fashion. The correct patient, procedure, and site was verified.   Injection Procedure Details:  Procedure Site One Meds Administered:  Meds ordered this encounter  Medications  . methylPREDNISolone acetate (DEPO-MEDROL) injection 80 mg     Laterality: Left  Location/Site:  L4-L5 L5-S1  Needle size: 18 G  Needle type: Radiofrequency cannula  Needle Placement: Along juncture of superior articular process and transverse pocess  Findings:  -Comments:  Procedure Details: For each desired target nerve, the corresponding transverse process (sacral ala for the L5 dorsal rami) was identified and the fluoroscope was positioned to square off the endplates of the corresponding vertebral body to achieve a true AP midline view.  The beam was then obliqued 15 to 20 degrees and caudally tilted 15 to 20 degrees to line up a trajectory along the target nerves. The skin over the target of the junction of superior articulating process and transverse process (sacral ala for the L5 dorsal rami) was infiltrated with 35ml of 1% Lidocaine without Epinephrine.  The 18 gauge 50mm active tip outer cannula was advanced in trajectory view to the target.  This procedure was repeated for each target nerve.  Then, for all levels, the outer cannula placement was fine-tuned and the position was then confirmed with bi-planar imaging.    Test stimulation was done both at sensory and motor levels to ensure there was no radicular stimulation. The target tissues were then infiltrated with 1 ml of 1% Lidocaine without  Epinephrine. Subsequently, a percutaneous neurotomy was carried out for 90 seconds at 80 degrees Celsius.  After the completion of the lesion, 1 ml of injectate was delivered. It was then repeated for each facet joint nerve mentioned above. Appropriate radiographs were obtained to verify the probe placement during the neurotomy.   Additional Comments:  The patient tolerated the procedure well Dressing: 2 x 2 sterile gauze and Band-Aid    Post-procedure details: Patient was observed during the procedure. Post-procedure instructions were reviewed.  Patient left the clinic in stable condition.

## 2019-06-29 NOTE — Progress Notes (Signed)
Jacob Warner - 85 y.o. male MRN [id number]  Date of birth: 1924/03/09  Office Visit Note: Visit Date: 06/21/2019 PCP: [name], MD Referred by: [name], MD  Subjective: Chief Complaint  Patient presents with  . Lower Back - Pain   HPI:  Jacob Warner is a 85 y.o. male who comes in today For planned repeat radiofrequency ablation of the left L4-5 and L5-S1 facet joints.  By way of quick review patient has had chronic long-term axial back pain for many years.  He was a patient of Dr. Otho Ket and was deemed really not a great surgical candidate.  Ultimately patient had spinal cord stimulator placed and is done fairly well with that for some buttock and hip pain.  He has no radicular pain down the legs.  He has no paresthesias no new findings.  His history is such that he had lumbar radiofrequency ablation performed by Dr. Mina Marble at [facility].  Unfortunately that procedure according to him was very painful he never wanted to have it again.  He can review our notes however we did ultimately repeat this ablation and did well enough that he felt like it was not a big deal and the procedure was much different than he remembered.  He did get good relief last year with the procedure.  His symptoms have returned and his axial back pain worse with standing and extension better with sitting and at rest.  Please see our prior note for further evaluation.  Exam consistent with facet mediated pain as well as imaging.  No new findings or trauma.  ROS Otherwise per HPI.  Assessment & Plan: Visit Diagnoses:  1. Spondylosis without myelopathy or radiculopathy, lumbar region     Plan: No additional findings.   Meds & Orders:  Meds ordered this encounter  Medications  . methylPREDNISolone acetate (DEPO-MEDROL) injection 80 mg    Orders Placed This Encounter  Procedures  . Radiofrequency,Lumbar  . XR C-ARM NO REPORT    Follow-up: Return if symptoms worsen or fail to improve.    Procedures: No procedures performed  Lumbar Facet Joint Nerve Denervation  Patient: Jacob Warner      Date of Birth: [birth year] MRN: [id number] PCP: [name], MD      Visit Date: 06/21/2019   Universal Protocol:    Date/Time: 07/01/208:23 AM  Consent Given By: the patient  Position: PRONE  Additional Comments: Vital signs were monitored before and after the procedure. Patient was prepped and draped in the usual sterile fashion. The correct patient, procedure, and site was verified.   Injection Procedure Details:  Procedure Site One Meds Administered:  Meds ordered this encounter  Medications  . methylPREDNISolone acetate (DEPO-MEDROL) injection 80 mg     Laterality: Left  Location/Site:  L4-L5 L5-S1  Needle size: 18 G  Needle type: Radiofrequency cannula  Needle Placement: Along juncture of superior articular process and transverse pocess  Findings:  -Comments:  Procedure Details: For each desired target nerve, the corresponding transverse process (sacral ala for the L5 dorsal rami) was identified and the fluoroscope was positioned to square off the endplates of the corresponding vertebral body to achieve a true AP midline view.  The beam was then obliqued 15 to 20 degrees and caudally tilted 15 to 20 degrees to line up a trajectory along the target nerves. The skin over the target of the junction of superior articulating process and transverse process (sacral ala for the L5 dorsal rami) was infiltrated with  40ml of 1% Lidocaine without Epinephrine.  The 18 gauge 76mm active tip outer cannula was advanced in trajectory view to the target.  This procedure was repeated for each target nerve.  Then, for all levels, the outer cannula placement was fine-tuned and the position was then confirmed with bi-planar imaging.    Test stimulation was done both at sensory and motor levels to ensure there was no radicular stimulation. The target tissues were then infiltrated  with 1 ml of 1% Lidocaine without Epinephrine. Subsequently, a percutaneous neurotomy was carried out for 90 seconds at 80 degrees Celsius.  After the completion of the lesion, 1 ml of injectate was delivered. It was then repeated for each facet joint nerve mentioned above. Appropriate radiographs were obtained to verify the probe placement during the neurotomy.   Additional Comments:  The patient tolerated the procedure well Dressing: 2 x 2 sterile gauze and Band-Aid    Post-procedure details: Patient was observed during the procedure. Post-procedure instructions were reviewed.  Patient left the clinic in stable condition.      Clinical History: CT PELVIS WITHOUT CONTRAST  TECHNIQUE: Multidetector CT imaging of the pelvis was performed following the standard protocol without intravenous contrast.  COMPARISON:  CT of the abdomen and pelvis on 06/26/2016, plain films 07/25/2018  FINDINGS: Urinary Tract: Normal appearance of the urinary bladder and distal ureters.  Bowel: Nonobstructed bowel-gas pattern. There are numerous radiopaque tablets within the cecum and descending colon. I count approximately 25 tablets in the region of the cecum. These are primarily uniform and size, measuring approximately 1 centimeter in diameter. Findings are consistent with stasis but also raise the possibility of numerous tablets ingested at a single time.  Vascular/Lymphatic: There is dense atherosclerotic calcification of the abdominal aorta and femoral arteries. No aneurysm. No retroperitoneal or mesenteric adenopathy.  Reproductive:  Prostatic calcifications are present.  Other: There is partially imaged soft tissue in the region of the superior mesenteric artery and vein, similar in appearance to the previous exams.  Musculoskeletal: Remote fractures of the superior and inferior pubic rami bilaterally. Remote LEFT sacral fracture. No evidence for acute fracture or  subluxation. Nondisplaced fractures cannot be excluded. Small amount of stranding in the subcutaneous region of the LEFT buttock.  IMPRESSION: 1. No acute, displaced fracture. Depending on the clinical suspicion, MRI may be helpful in detecting an nondisplaced fracture. 2. Remote fractures of the LEFT sacrum, superior and inferior pubic rami bilaterally. 3. Numerous radiopaque tablets in the proximal colon. Although the findings could be related to stasis, overdose needs to be considered. 4. Persistent soft tissue in the region the superior mesenteric artery and vein. The appearance is nonspecific and stable. 5. These results were called by telephone at the time of interpretation on 07/29/2018 at 11:37 am to Dr. Basil Dess , who verbally acknowledged these results.   Electronically Signed   By: Nolon Nations M.D.   On: 07/29/2018 11:37     Objective:  VS:  HT:    WT:   BMI:     BP:134/66  HR:89bpm  TEMP:98.2 F (36.8 C)( )  RESP:96 % Physical Exam Musculoskeletal:     Comments: Patient ambulates with a cane slightly forward flexed lumbar spine good distal strength no pain with hip range of motion.  He has pain with facet loading.     Ortho Exam Imaging: Xr C-arm No Report  Result Date: 06/28/2019 Please see Notes tab for imaging impression.

## 2019-06-29 NOTE — Progress Notes (Signed)
Jacob Warner - 85 y.o. male MRN [id number]  Date of birth: February 14, 1924  Office Visit Note: Visit Date: 06/28/2019 PCP: [name], MD Referred by: [name], MD  Subjective: Chief Complaint  Patient presents with  . Lower Back - Pain   HPI:  Jacob Warner is a 85 y.o. male who comes in today For repeat radiofrequency ablation of the right L4-5 and L5-S1 facet joints.  Patient's had prior ablation with good relief last year.  He actually had an ablation performed by Dr. Jacelyn Grip several years ago that was very painful and he really did not ever want to repeat it but we have been pretty successful repeating it without much pain involved at all.  He is really surprised with the fact that when we do it it does not seem to hurt very much.  He does get relief with the ablation for lower lumbar facet arthropathy.  He has concordant low back pain with extension and facet loading no lumbar radiculopathy or leg pain.  Has had prior spinal cord stimulator placement.  Has really failed all manner of conservative care otherwise.  ROS Otherwise per HPI.  Assessment & Plan: Visit Diagnoses:  1. Spondylosis without myelopathy or radiculopathy, lumbar region     Plan: No additional findings.   Meds & Orders:  Meds ordered this encounter  Medications  . methylPREDNISolone acetate (DEPO-MEDROL) injection 80 mg    Orders Placed This Encounter  Procedures  . Radiofrequency,Lumbar  . XR C-ARM NO REPORT    Follow-up: Return if symptoms worsen or fail to improve.   Procedures: No procedures performed  Lumbar Facet Joint Nerve Denervation  Patient: Jacob Warner      Date of Birth: 18-Nov-1924 MRN: [id number] PCP: [name], MD      Visit Date: 06/28/2019   Universal Protocol:    Date/Time: 07/01/208:21 AM  Consent Given By: the patient  Position: PRONE  Additional Comments: Vital signs were monitored before and after the procedure. Patient was prepped and draped in the usual  sterile fashion. The correct patient, procedure, and site was verified.   Injection Procedure Details:  Procedure Site One Meds Administered:  Meds ordered this encounter  Medications  . methylPREDNISolone acetate (DEPO-MEDROL) injection 80 mg     Laterality: Right  Location/Site:  L4-L5 L5-S1  Needle size: 18 G  Needle type: Radiofrequency cannula  Needle Placement: Along juncture of superior articular process and transverse pocess  Findings:  -Comments:  Procedure Details: For each desired target nerve, the corresponding transverse process (sacral ala for the L5 dorsal rami) was identified and the fluoroscope was positioned to square off the endplates of the corresponding vertebral body to achieve a true AP midline view.  The beam was then obliqued 15 to 20 degrees and caudally tilted 15 to 20 degrees to line up a trajectory along the target nerves. The skin over the target of the junction of superior articulating process and transverse process (sacral ala for the L5 dorsal rami) was infiltrated with 22ml of 1% Lidocaine without Epinephrine.  The 18 gauge 60mm active tip outer cannula was advanced in trajectory view to the target.  This procedure was repeated for each target nerve.  Then, for all levels, the outer cannula placement was fine-tuned and the position was then confirmed with bi-planar imaging.    Test stimulation was done both at sensory and motor levels to ensure there was no radicular stimulation. The target tissues were then infiltrated with 1 ml  of 1% Lidocaine without Epinephrine. Subsequently, a percutaneous neurotomy was carried out for 90 seconds at 80 degrees Celsius.  After the completion of the lesion, 1 ml of injectate was delivered. It was then repeated for each facet joint nerve mentioned above. Appropriate radiographs were obtained to verify the probe placement during the neurotomy.   Additional Comments:  The patient tolerated the procedure well  Dressing: 2 x 2 sterile gauze and Band-Aid    Post-procedure details: Patient was observed during the procedure. Post-procedure instructions were reviewed.  Patient left the clinic in stable condition.      Clinical History: CT PELVIS WITHOUT CONTRAST  TECHNIQUE: Multidetector CT imaging of the pelvis was performed following the standard protocol without intravenous contrast.  COMPARISON:  CT of the abdomen and pelvis on 06/26/2016, plain films 07/25/2018  FINDINGS: Urinary Tract: Normal appearance of the urinary bladder and distal ureters.  Bowel: Nonobstructed bowel-gas pattern. There are numerous radiopaque tablets within the cecum and descending colon. I count approximately 25 tablets in the region of the cecum. These are primarily uniform and size, measuring approximately 1 centimeter in diameter. Findings are consistent with stasis but also raise the possibility of numerous tablets ingested at a single time.  Vascular/Lymphatic: There is dense atherosclerotic calcification of the abdominal aorta and femoral arteries. No aneurysm. No retroperitoneal or mesenteric adenopathy.  Reproductive:  Prostatic calcifications are present.  Other: There is partially imaged soft tissue in the region of the superior mesenteric artery and vein, similar in appearance to the previous exams.  Musculoskeletal: Remote fractures of the superior and inferior pubic rami bilaterally. Remote LEFT sacral fracture. No evidence for acute fracture or subluxation. Nondisplaced fractures cannot be excluded. Small amount of stranding in the subcutaneous region of the LEFT buttock.  IMPRESSION: 1. No acute, displaced fracture. Depending on the clinical suspicion, MRI may be helpful in detecting an nondisplaced fracture. 2. Remote fractures of the LEFT sacrum, superior and inferior pubic rami bilaterally. 3. Numerous radiopaque tablets in the proximal colon. Although the findings  could be related to stasis, overdose needs to be considered. 4. Persistent soft tissue in the region the superior mesenteric artery and vein. The appearance is nonspecific and stable. 5. These results were called by telephone at the time of interpretation on 07/29/2018 at 11:37 am to Dr. Basil Dess , who verbally acknowledged these results.   Electronically Signed   By: Nolon Nations M.D.   On: 07/29/2018 11:37     Objective:  VS:  HT:    WT:   BMI:     BP:118/64  HR:83bpm  TEMP: ( )  RESP:  Physical Exam  Ortho Exam Imaging: Xr C-arm No Report  Result Date: 06/28/2019 Please see Notes tab for imaging impression.

## 2019-07-11 ENCOUNTER — Other Ambulatory Visit: Payer: Self-pay | Admitting: Family Medicine

## 2019-07-11 ENCOUNTER — Encounter: Payer: Self-pay | Admitting: Physical Medicine and Rehabilitation

## 2019-07-11 DIAGNOSIS — R519 Headache, unspecified: Secondary | ICD-10-CM

## 2019-07-11 DIAGNOSIS — M545 Low back pain: Secondary | ICD-10-CM | POA: Diagnosis not present

## 2019-07-11 DIAGNOSIS — R220 Localized swelling, mass and lump, head: Secondary | ICD-10-CM

## 2019-07-11 DIAGNOSIS — M7918 Myalgia, other site: Secondary | ICD-10-CM

## 2019-07-11 DIAGNOSIS — G8929 Other chronic pain: Secondary | ICD-10-CM | POA: Diagnosis not present

## 2019-07-11 DIAGNOSIS — R42 Dizziness and giddiness: Secondary | ICD-10-CM

## 2019-07-11 MED ORDER — LIDOCAINE HCL (PF) 1 % IJ SOLN
3.0000 mL | INTRAMUSCULAR | Status: AC | PRN
  Administered 2019-07-11: 3 mL

## 2019-07-11 MED ORDER — TRIAMCINOLONE ACETONIDE 40 MG/ML IJ SUSP
40.0000 mg | INTRAMUSCULAR | Status: AC | PRN
  Administered 2019-07-11: 40 mg via INTRAMUSCULAR

## 2019-07-11 NOTE — Progress Notes (Signed)
Jacob Warner - 85 y.o. male MRN [id number]  Date of birth: 10-Sep-1924  Office Visit Note: Visit Date: 06/01/2019 PCP: [name], MD Referred by: [name], MD  Subjective: Chief Complaint  Patient presents with   Lower Back - Pain   Right Hip - Pain   Left Hip - Pain   HPI: Jacob Warner is a 85 y.o. male who comes in today For evaluation management of low back pain and bilateral what he refers to his buttock or hip pain.  He denies any groin pain.  By way of review he has a history of remote lumbar laminectomy.  He has had MRI and CT evidence of facet arthropathy of the lower spine as well as some lumbar stenosis but no high-grade stenosis.  He does extremely well at 89.  He is having to take care of his wife to some degree who has pretty significant cancer at this point.  He was deemed not to be a good surgical candidate at all, obviously, and we did end up placing a spinal cord stimulator which he uses quite frequently and does seem to help to a degree.  He continues to have mechanical type pain of the lower spine.  We have completed radiofrequency ablation of the lower facet joints in June of last year with good relief up until recently.  He reports no new falls or injury.  He has been predominantly a patient of Dr. Basil Dess from a spine surgery standpoint.  He was a prior patient of Dr. Mina Marble who does interventional spine pain management at [facility].  Patient denies any pain down the legs past the knee.  He reports prior injection last year from a trigger point standpoint was actually fairly beneficial as well.  He has a lot of time sitting and walking makes the pain worse and nothing seems to help with his pain.  He does get some pain over the left ischium.  He does use a cane for ambulation.  He has had no other red flag complaints.  Review of Systems  Constitutional: Negative for chills, fever, malaise/fatigue and weight loss.  HENT: Negative for hearing loss  and sinus pain.   Eyes: Negative for blurred vision, double vision and photophobia.  Respiratory: Negative for cough and shortness of breath.   Cardiovascular: Negative for chest pain, palpitations and leg swelling.  Gastrointestinal: Negative for abdominal pain, nausea and vomiting.  Genitourinary: Negative for flank pain.  Musculoskeletal: Positive for back pain and joint pain. Negative for myalgias.  Skin: Negative for itching and rash.  Neurological: Negative for tremors, focal weakness and weakness.  Endo/Heme/Allergies: Negative.   Psychiatric/Behavioral: Negative for depression.  All other systems reviewed and are negative.  Otherwise per HPI.  Assessment & Plan: Visit Diagnoses:  1. Chronic bilateral low back pain without sciatica   2. Spondylosis without myelopathy or radiculopathy, lumbar region   3. Myofascial pain syndrome   4. Post laminectomy syndrome   5. Chronic pain syndrome     Plan: Findings:  Multifactorial low back and buttock and hip pain which I feel like is related to the lumbar spine facet arthropathy and some narrowing of the canal although it is not very severe.  His symptoms are not alleviated with spinal cord stimulator although they are helped.  This seems to be somewhat mechanical and myofascial which tends not to be helped as well with the stimulator.  He has had reprogramming and is in contact with [employer].  Today we completed trigger point type muscle tendon injection of the bilateral quadratus lumborum musculature with some cortisone to see if that would help.  If he does not get as much relief as we had hoped that I would look at repeating his radiofrequency ablation that he had last year.  He did get good relief with that from an axial standpoint.  He was not having as much buttock pain at that time.  If he is just not getting any better we could look at a one-time epidural injection.  He continues to stay active and he has been through physical  therapy.  He is again helping to take care of his wife who has fairly significant cancer at this point.    Meds & Orders: No orders of the defined types were placed in this encounter.   Orders Placed This Encounter  Procedures   Trigger Point Inj    Follow-up: Return if symptoms worsen or fail to improve.   Procedures: Trigger Point Inj  Date/Time: 07/11/2019 12:05 PM Performed by: Magnus Sinning, MD Authorized by: Magnus Sinning, MD   Consent Given by:  Patient Site marked: the procedure site was marked   Timeout: prior to procedure the correct patient, procedure, and site was verified   Indications:  Pain Total # of Trigger Points:  1 Location: back   Needle Size:  25 G Approach:  Dorsal and lateral Medications #1:  40 mg triamcinolone acetonide 40 MG/ML; 3 mL lidocaine (PF) 1 % Patient tolerance:  Patient tolerated the procedure well with no immediate complications Comments: Patient had local injection along the muscle tendon junction of the quadratus lumborum bilaterally at the lumbosacral junction at the area of his pain.    No notes on file   Clinical History: CT PELVIS WITHOUT CONTRAST  TECHNIQUE: Multidetector CT imaging of the pelvis was performed following the standard protocol without intravenous contrast.  COMPARISON:  CT of the abdomen and pelvis on 06/26/2016, plain films 07/25/2018  FINDINGS: Urinary Tract: Normal appearance of the urinary bladder and distal ureters.  Bowel: Nonobstructed bowel-gas pattern. There are numerous radiopaque tablets within the cecum and descending colon. I count approximately 25 tablets in the region of the cecum. These are primarily uniform and size, measuring approximately 1 centimeter in diameter. Findings are consistent with stasis but also raise the possibility of numerous tablets ingested at a single time.  Vascular/Lymphatic: There is dense atherosclerotic calcification of the abdominal aorta and  femoral arteries. No aneurysm. No retroperitoneal or mesenteric adenopathy.  Reproductive:  Prostatic calcifications are present.  Other: There is partially imaged soft tissue in the region of the superior mesenteric artery and vein, similar in appearance to the previous exams.  Musculoskeletal: Remote fractures of the superior and inferior pubic rami bilaterally. Remote LEFT sacral fracture. No evidence for acute fracture or subluxation. Nondisplaced fractures cannot be excluded. Small amount of stranding in the subcutaneous region of the LEFT buttock.  IMPRESSION: 1. No acute, displaced fracture. Depending on the clinical suspicion, MRI may be helpful in detecting an nondisplaced fracture. 2. Remote fractures of the LEFT sacrum, superior and inferior pubic rami bilaterally. 3. Numerous radiopaque tablets in the proximal colon. Although the findings could be related to stasis, overdose needs to be considered. 4. Persistent soft tissue in the region the superior mesenteric artery and vein. The appearance is nonspecific and stable. 5. These results were called by telephone at the time of interpretation on 07/29/2018 at 11:37 am to Dr. Basil Dess ,  who verbally acknowledged these results.   Electronically Signed   By: Nolon Nations M.D.   On: 07/29/2018 11:37   He reports that he has never smoked. He has never used smokeless tobacco. No results for input(s): HGBA1C, LABURIC in the last 8760 hours.  Objective:  VS:  HT:_0  (177.8 cm)    WT:211 lb (95.7 kg)   BMI:30.28     BP:(!) 116/58   HR:76bpm   TEMP: ( )   RESP:  Physical Exam Vitals signs and nursing note reviewed.  Constitutional:      General: He is not in acute distress.    Appearance: Normal appearance. He is well-developed. He is obese.  HENT:     Head: Normocephalic and atraumatic.  Eyes:     Conjunctiva/sclera: Conjunctivae normal.     Pupils: Pupils are equal, round, and reactive to light.  Neck:      Musculoskeletal: Normal range of motion and neck supple. No neck rigidity.  Cardiovascular:     Rate and Rhythm: Normal rate.     Pulses: Normal pulses.     Heart sounds: Normal heart sounds.  Pulmonary:     Effort: Pulmonary effort is normal. No respiratory distress.  Musculoskeletal:     Right lower leg: No edema.     Left lower leg: No edema.     Comments: Patient ambulates with a cane he ambulates with a forward flexed lumbar spine.  He does have pain with extension of the lumbar spine and facet loading.  He has pretty prominent pain bilaterally at the lumbosacral junction at the quadratus lumborum.  There are taut bands.  No pain over the greater trochanter no pain with hip rotation good distal strength.  No induration or swelling or any problems around the generator site of his spinal cord stimulator.  Skin:    General: Skin is warm and dry.     Findings: No erythema or rash.  Neurological:     General: No focal deficit present.     Mental Status: He is alert and oriented to person, place, and time.     Sensory: No sensory deficit.     Coordination: Coordination normal.     Gait: Gait abnormal.  Psychiatric:        Mood and Affect: Mood normal.        Behavior: Behavior normal.     Ortho Exam Imaging: No results found.  Past Medical/Family/Surgical/Social History: Medications & Allergies reviewed per EMR, new medications updated. 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 03/03/2016   Primary open-angle glaucoma, right eye, indeterminate stage 03/03/2016     Squamous blepharitis of upper and lower eyelids of both eyes 03/03/2016   Bradycardia 05/31/2015   Pacemaker 05/31/2015   Peripheral edema 05/31/2015   Heart murmur 05/31/2015   Sick sinus syndrome ([place]) 05/31/2015   Nonsustained ventricular tachycardia ([place]) 75/09/2584   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   Past Medical History:  Diagnosis Date   A-fib ([place])    Alopecia    Anemia    Bradycardia    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    Echocardiogram    Echo 11/19: Mild concentric LVH, EF 50-55, normal wall motion, grade 1 diastolic dysfunction, mild AI, MAC, mild MR, normal RVSF, moderate TR, PASP 36   GERD (gastroesophageal reflux disease)    Glaucoma    Hypertension    Nuclear stress test    Nuclear stress test 11/19: not gated, inf and apical defect c/w small scar and/or soft tissue attenuation   Pelvic fracture ([facility])    Right BBB/left ant fasc block    Sick sinus syndrome ([facility])    TIA (transient ischemic attack)    Xeroderma    Family History  Problem Relation Age of Onset   Brain cancer Sister    Past Surgical History:  Procedure Laterality Date   APPENDECTOMY     CERVICAL SPINE SURGERY     EXPLORATORY LAPAROTOMY     LITHOTRIPSY     PACEMAKER PLACEMENT     Social History   Occupational History   Not on file  Tobacco Use   Smoking status: Never Smoker   Smokeless tobacco: Never Used  Substance and Sexual Activity   Alcohol use: No    Alcohol/week: 0.0 standard drinks   Drug use: No   Sexual activity: Not on file

## 2019-07-21 ENCOUNTER — Other Ambulatory Visit: Payer: Self-pay

## 2019-07-21 ENCOUNTER — Ambulatory Visit
Admission: RE | Admit: 2019-07-21 | Discharge: 2019-07-21 | Disposition: A | Discharge status: Home / Self Care | Payer: Medicare Other | Source: Ambulatory Visit | Attending: Family Medicine | Admitting: Family Medicine

## 2019-07-21 ENCOUNTER — Other Ambulatory Visit: Payer: Medicare Other

## 2019-07-21 DIAGNOSIS — R519 Headache, unspecified: Secondary | ICD-10-CM

## 2019-07-21 DIAGNOSIS — R220 Localized swelling, mass and lump, head: Secondary | ICD-10-CM

## 2019-07-21 DIAGNOSIS — R42 Dizziness and giddiness: Secondary | ICD-10-CM

## 2019-07-22 ENCOUNTER — Telehealth: Payer: Self-pay

## 2019-07-22 ENCOUNTER — Telehealth: Payer: Self-pay | Admitting: Cardiology

## 2019-07-22 NOTE — Telephone Encounter (Signed)
New Message    Pt c/o Shortness Of Breath: STAT if SOB developed within the last 24 hours or pt is noticeably SOB on the phone  1. Are you currently SOB (can you hear that pt is SOB on the phone)? Yes, did not speak to the patient spoke with the patients wife  2. How long have you been experiencing SOB? 2-3 days just worse today  3. Are you SOB when sitting or when up moving around? Moving around   4. Are you currently experiencing any other symptoms? No

## 2019-07-22 NOTE — Telephone Encounter (Signed)
I spoke with pt's wife. She reports pt has been short of breath for last 3-4 weeks. Seems to be getting worse. Occurs with exertion such as walking around the house. Very little lower extremity swelling.  Wife thinks swelling has improved. He has been scheduled to see B. Rosita Fire, [place] on July 27 and will plan on keeping this appointment.  No to covid screening questions. I advised wife that pt should go to ED if shortness of breath worsens prior to appointment.

## 2019-07-22 NOTE — Telephone Encounter (Signed)
Spoke with pt and wife and they both answered no to prescreening questions. Pt's wife Jacob Warner will be accompanying him to his appt on Monday.        COVID-19 Pre-Screening Questions:  . In the past 7 to 10 days have you had a cough,  shortness of breath, headache, congestion, fever (100 or greater) body aches, chills, sore throat, or sudden loss of taste or sense of smell? NO . Have you been around anyone with known Covid 19? NO . Have you been around anyone who is awaiting Covid 19 test results in the past 7 to 10 days? NO . Have you been around anyone who has been exposed to Covid 19, or has mentioned symptoms of Covid 19 within the past 7 to 10 days? NO  If you have any concerns/questions about symptoms patients report during screening (either on the phone or at threshold). Contact the provider seeing the patient or DOD for further guidance.  If neither are available contact a member of the leadership team.

## 2019-07-25 ENCOUNTER — Other Ambulatory Visit: Payer: Self-pay

## 2019-07-25 ENCOUNTER — Ambulatory Visit (INDEPENDENT_AMBULATORY_CARE_PROVIDER_SITE_OTHER): Payer: Medicare Other | Admitting: Cardiology

## 2019-07-25 ENCOUNTER — Encounter: Payer: Self-pay | Admitting: Cardiology

## 2019-07-25 VITALS — BP 126/58 | HR 82 | Ht 70.0 in | Wt 206.0 lb

## 2019-07-25 DIAGNOSIS — I5032 Chronic diastolic (congestive) heart failure: Secondary | ICD-10-CM

## 2019-07-25 DIAGNOSIS — R0600 Dyspnea, unspecified: Secondary | ICD-10-CM

## 2019-07-25 NOTE — Progress Notes (Signed)
07/25/2019 Jacob Warner   [birth year]  [id number]  Primary Physician [name], MD Primary Cardiologist: Ena Dawley, MD  Electrophysiologist: Cristopher Peru, MD   Reason for Visit/CC: dyspnea   HPI:  Jacob Warner is a 85 y.o. male, here today for evaluation for dyspnea. 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  Also has PAF on amiodarone and aspirin.  He does have a history of falls so not on anticoagulation.  No history of hypertension, CAD, DM.  He does have a history of a TIA and PE after undergoing back surgery in the past. Nuclear stress test 10/2018 showed thinning with decreased tracer activity in the mid distal inferior and apical walls no change in recovery images.  Consistent with small region of scar and/or soft tissue attenuation.  Echo showed normal LVEF 50 to 55% with mild concentric LVH and grade 1 DD, mild AI, moderate TR. Had recent device interrogation in June. Normal function. Batter longevity 11 months.   He is here today w/ his wife w/ CC of SOB. Symptoms started about 1 month ago and progressively getting worse. Only with activity. No symptoms at rest. Trace pretibial edema on exam. He reports full compliance w/ Lasix but admits to seasoning food with salt. No wheezing, cough or associated CP.   EKG shows NSR, V paced rhythm. 82 bpm, LVH w/ repol abnormalities. EKG unchanged compared to prior. BP controlled 126/58.   Current Meds  Medication Sig  . acetaminophen (TYLENOL) 500 MG tablet Take 1,000 mg by mouth every 6 (six) hours as needed (pain).   Marland Kitchen amiodarone (PACERONE) 200 MG tablet Take 1 tablet (200 mg total) by mouth as directed. Take one tablet by mouth Monday through Friday.  Marland Kitchen aspirin 81 MG tablet Take 81 mg by mouth daily.  . ciprofloxacin (CIPRO) 500 MG tablet Take 1 tablet (500 mg total) by mouth 2 (two) times daily. Take for 5 days only.  . Cyanocobalamin (VITAMIN B-12 PO) Take 1  tablet by mouth daily.  Marland Kitchen denosumab (PROLIA) 60 MG/ML SOSY injection Inject 60 mg into the skin every 6 (six) months.  . docusate sodium (COLACE) 100 MG capsule Take 100 mg by mouth daily.  Marland Kitchen doxazosin (CARDURA) 4 MG tablet Take 4 mg by mouth at bedtime.  . dutasteride (AVODART) 0.5 MG capsule Take 0.5 mg by mouth daily.  . furosemide (LASIX) 20 MG tablet Take 1 tablet (20 mg total) by mouth daily.  Marland Kitchen gabapentin (NEURONTIN) 300 MG capsule Take 300 mg by mouth every morning.   Marland Kitchen HYDROcodone-acetaminophen (NORCO) 5-325 MG tablet Take 1 tablet by mouth every 4 (four) hours as needed.  . hydroxypropyl methylcellulose / hypromellose (ISOPTO TEARS / GONIOVISC) 2.5 % ophthalmic solution Place 1 drop into both eyes as needed for dry eyes.  Marland Kitchen lactulose (CHRONULAC) 10 GM/15ML solution Take 10 g by mouth daily.   Marland Kitchen latanoprost (XALATAN) 0.005 % ophthalmic solution PLACE 1 DROP INTO BOTH EYES AT BEDTIME  . Magnesium Oxide 400 (240 Mg) MG TABS Take 1 tablet by mouth 2 (two) times daily.  . predniSONE (DELTASONE) 10 MG tablet TAKE 4 TABLETS BY MOUTH EVERY DAY FOR 5 DAYS  . RESTASIS 0.05 % ophthalmic emulsion PLACE 1 DROP INTO BOTH EYES 2 TIMES DAILY.  . traZODone (DESYREL) 50 MG tablet TAKE 1 TO 2 TABLETS AT BEDTIME AS NEEDED FOR INSOMNIA  . Vitamin D, Ergocalciferol, (DRISDOL) 50000 units CAPS capsule Take 1  capsule by mouth every Tuesday.   . White Petrolatum-Mineral Oil (EYE LUBRICANT) OINT Apply 1 strip to eye at bedtime.   Current Facility-Administered Medications for the 07/25/19 encounter (Office Visit) with Consuelo Pandy, PA-C  Medication  . methylPREDNISolone acetate (DEPO-MEDROL) injection 80 mg   Allergies  Allergen Reactions  . Sulfa Antibiotics Hives and Rash    Bactrim  . Sulfacetamide Sodium Hives and Rash    Bactrim   Past Medical History:  Diagnosis Date  . A-fib ([place])   . Alopecia   . Anemia   . Bradycardia   . Cancer ([place])   . Cherry angioma   . Echocardiogram    Echo  11/19: Mild concentric LVH, EF 50-55, normal wall motion, grade 1 diastolic dysfunction, mild AI, MAC, mild MR, normal RVSF, moderate TR, PASP 36  . GERD (gastroesophageal reflux disease)   . Glaucoma   . Hypertension   . Nuclear stress test    Nuclear stress test 11/19: not gated, inf and apical defect c/w small scar and/or soft tissue attenuation  . Pelvic fracture ([place])   . Right BBB/left ant fasc block   . Sick sinus syndrome ([place])   . TIA (transient ischemic attack)   . Xeroderma    Family History  Problem Relation Age of Onset  . Brain cancer Sister    Past Surgical History:  Procedure Laterality Date  . APPENDECTOMY    . CERVICAL SPINE SURGERY    . EXPLORATORY LAPAROTOMY    . LITHOTRIPSY    . PACEMAKER PLACEMENT     Social History   Socioeconomic History  . Marital status: Married    Spouse name: Not on file  . Number of children: Not on file  . Years of education: Not on file  . Highest education level: Not on file  Occupational History  . Not on file  Social Needs  . Financial resource strain: Not on file  . Food insecurity    Worry: Not on file    Inability: Not on file  . Transportation needs    Medical: Not on file    Non-medical: Not on file  Tobacco Use  . Smoking status: Never Smoker  . Smokeless tobacco: Never Used  Substance and Sexual Activity  . Alcohol use: No    Alcohol/week: 0.0 standard drinks  . Drug use: No  . Sexual activity: Not on file  Lifestyle  . Physical activity    Days per week: Not on file    Minutes per session: Not on file  . Stress: Not on file  Relationships  . Social Herbalist on phone: Not on file    Gets together: Not on file    Attends religious service: Not on file    Active member of club or organization: Not on file    Attends meetings of clubs or organizations: Not on file    Relationship status: Not on file  . Intimate partner violence    Fear of current or ex partner: Not on file     Emotionally abused: Not on file    Physically abused: Not on file    Forced sexual activity: Not on file  Other Topics Concern  . Not on file  Social History Narrative  . Not on file     Lipid Panel  No results found for: CHOL, TRIG, HDL, CHOLHDL, VLDL, LDLCALC, LDLDIRECT  Review of Systems: General: negative for chills, fever, night sweats or weight changes.  Cardiovascular: negative for chest pain, dyspnea on exertion, edema, orthopnea, palpitations, paroxysmal nocturnal dyspnea or shortness of breath Dermatological: negative for rash Respiratory: negative for cough or wheezing Urologic: negative for hematuria Abdominal: negative for nausea, vomiting, diarrhea, bright red blood per rectum, melena, or hematemesis Neurologic: negative for visual changes, syncope, or dizziness All other systems reviewed and are otherwise negative except as noted above.   Physical Exam:  Blood pressure (!) 126/58, pulse 82, height _0  (1.778 m), weight 206 lb (93.4 kg), SpO2 96 %.  General appearance: alert, cooperative and no distress Neck: no carotid bruit and no JVD Lungs: clear to auscultation bilaterally Heart: regular rate and rhythm, S1, S2 normal, no murmur, click, rub or gallop Extremities: extremities normal, atraumatic, no cyanosis or edema Pulses: 2+ and symmetric Skin: Skin color, texture, turgor normal. No rashes or lesions Neurologic: Grossly normal  EKG EKG shows NSR, V paced rhythm. 82 bpm, LVH w/ repol abnormalities. EKG unchanged compared to prior.-- personally reviewed   ASSESSMENT AND PLAN:   1.  Dyspnea: He has trace pretibial edema on exam and crackles in the left lower lobe.  Suspect acute on chronic diastolic heart failure.  Will check BNP today to confirm.  If elevated will advise patient to temporarily increase his Lasix dose from 20 to 40 mg daily.  He did have an echocardiogram this past November that showed normal LV function, grade 1 diastolic dysfunction and no  significant valvular abnormalities.  We will not plan to repeat at this time.  If BNP is normal, we will question possible amiodarone toxicity.  He may need pulmonary function test.  Other considerations could be possible anemia.  I will check a CBC today.  He denies any chest pain associated with symptoms.  We will hold off on any ischemic evaluation until work-up for acute CHF is completed.  He also has a permanent pacemaker.  EKG today shows paced rhythm.  Underlying rhythm appears to be sinus.  He had a recent device interrogation which showed normal pacemaker function with battery longevity at 11 months. Dr. Lovena Le with continue to follow this. He has EP clinic appt scheduled next month.    Follow-Up: f/u with me in 2 weeks.   Brittainy Ladoris Gene, MHS [facility] 07/25/2019 4:01 PM

## 2019-07-25 NOTE — Patient Instructions (Signed)
Medication Instructions:  none If you need a refill on your cardiac medications before your next appointment, please call your pharmacy.   Lab work:TODAY CBC BNP BMP If you have labs (blood work) drawn today and your tests are completely normal, you will receive your results only by: Marland Kitchen MyChart Message (if you have MyChart) OR . A paper copy in the mail If you have any lab test that is abnormal or we need to change your treatment, we will call you to review the results.  Testing/Procedures: NONE  Follow-Up: Dr Meda Coffee in October:  [place], you and your health needs are our priority.  As part of our continuing mission to provide you with exceptional heart care, we have created designated Provider Care Teams.  These Care Teams include your primary Cardiologist (physician) and Advanced Practice Providers (APPs -  Physician Assistants and Nurse Practitioners) who all work together to provide you with the care you need, when you need it. .   Any Other Special Instructions Will Be Listed Below (If Applicable).

## 2019-07-26 LAB — CBC
Hematocrit: 33.6 % — ABNORMAL LOW (ref 37.5–51.0)
Hemoglobin: 11.2 g/dL — ABNORMAL LOW (ref 13.0–17.7)
MCH: 32.9 pg (ref 26.6–33.0)
MCHC: 33.3 g/dL (ref 31.5–35.7)
MCV: 99 fL — ABNORMAL HIGH (ref 79–97)
Platelets: 189 10*3/uL (ref 150–450)
RBC: 3.4 x10E6/uL — ABNORMAL LOW (ref 4.14–5.80)
RDW: 13.7 % (ref 11.6–15.4)
WBC: 5.3 10*3/uL (ref 3.4–10.8)

## 2019-07-26 LAB — BASIC METABOLIC PANEL
BUN/Creatinine Ratio: 21 (ref 10–24)
BUN: 30 mg/dL (ref 10–36)
CO2: 27 mmol/L (ref 20–29)
Calcium: 9 mg/dL (ref 8.6–10.2)
Chloride: 102 mmol/L (ref 96–106)
Creatinine, Ser: 1.45 mg/dL — ABNORMAL HIGH (ref 0.76–1.27)
GFR calc Af Amer: 48 mL/min/{1.73_m2} — ABNORMAL LOW (ref >59)
GFR calc non Af Amer: 42 mL/min/{1.73_m2} — ABNORMAL LOW (ref >59)
Glucose: 100 mg/dL — ABNORMAL HIGH (ref 65–99)
Potassium: 4.1 mmol/L (ref 3.5–5.2)
Sodium: 145 mmol/L — ABNORMAL HIGH (ref 134–144)

## 2019-07-26 LAB — PRO B NATRIURETIC PEPTIDE: NT-Pro BNP: 271 pg/mL (ref 0–486)

## 2019-07-27 ENCOUNTER — Telehealth: Payer: Self-pay

## 2019-07-27 NOTE — Telephone Encounter (Signed)
Notes recorded by Frederik Schmidt, RN on 07/27/2019 at 8:16 AM EDT  The patient has been notified of the result and verbalized understanding. All questions (if any) were answered.  Frederik Schmidt, RN 07/27/2019 8:16 AM

## 2019-07-27 NOTE — Telephone Encounter (Signed)
-----   Message from [employer], [place] sent at 07/26/2019  5:41 PM EDT ----- Fluid marker is normal. Does not appear to be in CHF. Continue current dose of Lasix 20 mg. CBC shows Hgb is stable. He has chronic anemia, but it has not worsened. Keep f/u with Dr. Lovena Le in August. Will defer continuation of amiodarone and possible ordering of PFTs to him.

## 2019-08-03 ENCOUNTER — Other Ambulatory Visit: Payer: Self-pay

## 2019-08-03 ENCOUNTER — Ambulatory Visit (INDEPENDENT_AMBULATORY_CARE_PROVIDER_SITE_OTHER): Payer: Medicare Other | Admitting: Internal Medicine

## 2019-08-03 ENCOUNTER — Encounter: Payer: Self-pay | Admitting: Internal Medicine

## 2019-08-03 VITALS — BP 116/64 | HR 93 | Ht 70.0 in | Wt 204.0 lb

## 2019-08-03 DIAGNOSIS — I495 Sick sinus syndrome: Secondary | ICD-10-CM | POA: Diagnosis not present

## 2019-08-03 DIAGNOSIS — Z950 Presence of cardiac pacemaker: Secondary | ICD-10-CM | POA: Diagnosis not present

## 2019-08-03 DIAGNOSIS — I5032 Chronic diastolic (congestive) heart failure: Secondary | ICD-10-CM | POA: Diagnosis not present

## 2019-08-03 NOTE — Progress Notes (Signed)
HPI [name] returns today for ongoing evaluation and management of his PPM. He is a pleasant 85 yo man who has a h/o PAF, sinus node dysfunction, s/p PPM insertion. He feels well with no chest pain. He has minimal dyspnea. No anginal symptoms or chest pain.  Allergies  Allergen Reactions  . Sulfa Antibiotics Hives and Rash    Bactrim  . Sulfacetamide Sodium Hives and Rash    Bactrim     Current Outpatient Medications  Medication Sig Dispense Refill  . acetaminophen (TYLENOL) 500 MG tablet Take 1,000 mg by mouth every 6 (six) hours as needed (pain).     Marland Kitchen amiodarone (PACERONE) 200 MG tablet Take 1 tablet (200 mg total) by mouth as directed. Take one tablet by mouth Monday through Friday. 70 tablet 3  . aspirin 81 MG tablet Take 81 mg by mouth daily.    . ciprofloxacin (CIPRO) 500 MG tablet Take 1 tablet (500 mg total) by mouth 2 (two) times daily. Take for 5 days only. 10 tablet 0  . Cyanocobalamin (VITAMIN B-12 PO) Take 1 tablet by mouth daily.    Marland Kitchen denosumab (PROLIA) 60 MG/ML SOSY injection Inject 60 mg into the skin every 6 (six) months.    . docusate sodium (COLACE) 100 MG capsule Take 100 mg by mouth daily.    Marland Kitchen doxazosin (CARDURA) 4 MG tablet Take 4 mg by mouth at bedtime.    . dutasteride (AVODART) 0.5 MG capsule Take 0.5 mg by mouth daily.    . furosemide (LASIX) 20 MG tablet Take 1 tablet (20 mg total) by mouth daily. 90 tablet 2  . gabapentin (NEURONTIN) 300 MG capsule Take 300 mg by mouth every morning.     Marland Kitchen HYDROcodone-acetaminophen (NORCO) 5-325 MG tablet Take 1 tablet by mouth every 4 (four) hours as needed. 10 tablet 0  . hydroxypropyl methylcellulose / hypromellose (ISOPTO TEARS / GONIOVISC) 2.5 % ophthalmic solution Place 1 drop into both eyes as needed for dry eyes.    Marland Kitchen lactulose (CHRONULAC) 10 GM/15ML solution Take 10 g by mouth daily.   0  . latanoprost (XALATAN) 0.005 % ophthalmic solution PLACE 1 DROP INTO BOTH EYES AT BEDTIME  11  . Magnesium Oxide 400  (240 Mg) MG TABS Take 1 tablet by mouth 2 (two) times daily.  7  . predniSONE (DELTASONE) 10 MG tablet TAKE 4 TABLETS BY MOUTH EVERY DAY FOR 5 DAYS  0  . RESTASIS 0.05 % ophthalmic emulsion PLACE 1 DROP INTO BOTH EYES 2 TIMES DAILY.  11  . traZODone (DESYREL) 50 MG tablet TAKE 1 TO 2 TABLETS AT BEDTIME AS NEEDED FOR INSOMNIA  11  . Vitamin D, Ergocalciferol, (DRISDOL) 50000 units CAPS capsule Take 1 capsule by mouth every Tuesday.     . White Petrolatum-Mineral Oil (EYE LUBRICANT) OINT Apply 1 strip to eye at bedtime.    . nitroGLYCERIN (NITROSTAT) 0.4 MG SL tablet Place 1 tablet (0.4 mg total) under the tongue every 5 (five) minutes as needed for chest pain. 25 tablet 5   Current Facility-Administered Medications  Medication Dose Route Frequency Provider Last Rate Last Dose  . methylPREDNISolone acetate (DEPO-MEDROL) injection 80 mg  80 mg Other Once Magnus Sinning, MD         Past Medical History:  Diagnosis Date  . A-fib ([place])   . Alopecia   . Anemia   . Bradycardia   . Cancer ([place])   . Cherry angioma   . Echocardiogram  Echo 11/19: Mild concentric LVH, EF 50-55, normal wall motion, grade 1 diastolic dysfunction, mild AI, MAC, mild MR, normal RVSF, moderate TR, PASP 36  . GERD (gastroesophageal reflux disease)   . Glaucoma   . Hypertension   . Nuclear stress test    Nuclear stress test 11/19: not gated, inf and apical defect c/w small scar and/or soft tissue attenuation  . Pelvic fracture ([place])   . Right BBB/left ant fasc block   . Sick sinus syndrome ([place])   . TIA (transient ischemic attack)   . Xeroderma     ROS:   All systems reviewed and negative except as noted in the HPI.   Past Surgical History:  Procedure Laterality Date  . APPENDECTOMY    . CERVICAL SPINE SURGERY    . EXPLORATORY LAPAROTOMY    . LITHOTRIPSY    . PACEMAKER PLACEMENT       Family History  Problem Relation Age of Onset  . Brain cancer Sister      Social History   Socioeconomic  History  . Marital status: Married    Spouse name: Not on file  . Number of children: Not on file  . Years of education: Not on file  . Highest education level: Not on file  Occupational History  . Not on file  Social Needs  . Financial resource strain: Not on file  . Food insecurity    Worry: Not on file    Inability: Not on file  . Transportation needs    Medical: Not on file    Non-medical: Not on file  Tobacco Use  . Smoking status: Never Smoker  . Smokeless tobacco: Never Used  Substance and Sexual Activity  . Alcohol use: No    Alcohol/week: 0.0 standard drinks  . Drug use: No  . Sexual activity: Not on file  Lifestyle  . Physical activity    Days per week: Not on file    Minutes per session: Not on file  . Stress: Not on file  Relationships  . Social Herbalist on phone: Not on file    Gets together: Not on file    Attends religious service: Not on file    Active member of club or organization: Not on file    Attends meetings of clubs or organizations: Not on file    Relationship status: Not on file  . Intimate partner violence    Fear of current or ex partner: Not on file    Emotionally abused: Not on file    Physically abused: Not on file    Forced sexual activity: Not on file  Other Topics Concern  . Not on file  Social History Narrative  . Not on file     BP 116/64   Pulse 93   Ht _0  (1.778 m)   Wt 204 lb (92.5 kg)   SpO2 96%   BMI 29.27 kg/m   Physical Exam:  Well appearing elderly man, NAD HEENT: Unremarkable Neck:  No JVD, no thyromegally Lymphatics:  No adenopathy Back:  No CVA tenderness Lungs:  Clear with no wheezes HEART:  Regular rate rhythm, no murmurs, no rubs, no clicks Abd:  soft, positive bowel sounds, no organomegally, no rebound, no guarding Ext:  2 plus pulses, no edema, no cyanosis, no clubbing Skin:  No rashes no nodules Neuro:  CN II through XII intact, motor grossly intact  EKG - nsr  DEVICE  Normal  device function.  See  PaceArt for details.   Assess/Plan: 1. Sinus node dysfunction - he is asymptomatic, s/p PPM insertion. 2. PPM - his [employer] DDD PPM is working normally. He has less than a year of battery longevity 3. Heart block - he is conduction on his own today. We have reprogrammed his device to allow for maximal intrinsic conduction.  4. Fatigue - this could be from too much atrial rate along with ventricular pacing. We have reprogrammed him today to allow him to conduct.  Mikle Bosworth.D.

## 2019-08-03 NOTE — Patient Instructions (Signed)
Medication Instructions:  Your physician recommends that you continue on your current medications as directed. Please refer to the Current Medication list given to you today.  Labwork: None ordered.  Testing/Procedures: None ordered.  Follow-Up: Your physician wants you to follow-up in: one year with Dr. Lovena Le.   You will receive a reminder letter in the mail two months in advance. If you don't receive a letter, please call our office to schedule the follow-up appointment.  Remote monitoring is used to monitor your Pacemaker from home. This monitoring reduces the number of office visits required to check your device to one time per year. It allows [place] to keep an eye on the functioning of your device to ensure it is working properly. You are scheduled for a device check from home on 09/13/2019. You may send your transmission at any time that day. If you have a wireless device, the transmission will be sent automatically. After your physician reviews your transmission, you will receive a postcard with your next transmission date.  Any Other Special Instructions Will Be Listed Below (If Applicable).  If you need a refill on your cardiac medications before your next appointment, please call your pharmacy.

## 2019-08-12 LAB — CUP PACEART INCLINIC DEVICE CHECK
Date Time Interrogation Session: 20200814164611
Implantable Lead Implant Date: 20130926
Implantable Lead Implant Date: 20130926
Implantable Lead Location: 753859
Implantable Lead Location: 753860
Implantable Lead Model: 4456
Implantable Lead Model: 4476
Implantable Lead Serial Number: 473023
Implantable Lead Serial Number: 523746
Implantable Pulse Generator Implant Date: 20130926
Pulse Gen Serial Number: 138586

## 2019-08-22 ENCOUNTER — Telehealth: Payer: Self-pay | Admitting: Internal Medicine

## 2019-08-22 NOTE — Telephone Encounter (Signed)
New Message  STAT if patient feels like he/she is going to faint   1) Are you dizzy now? Yes  2) Do you feel faint or have you passed out? Yes  3) Do you have any other symptoms? SOB and dizziness, nauseous   4) Have you checked your HR and BP (record if available)? 114/70

## 2019-08-22 NOTE — Telephone Encounter (Signed)
Returned call to patient's wife. She reports ShOB has been ongoing for months, saw B. B[place], [place], on 07/25/19. Nausea has only occurred once or twice. Dizziness noted with position changes and sometimes with walking, reports this is new since PPM reprogramming (to encourage conduction and make rate response less aggressive). SBPs at home run 110s-120s. Med list accurate, though pt takes doxazosin in the AM, not QHS. Fatigue level about the same as prior to reprogramming.  Advised pt's wife that transmission shows normal PPM function. Presenting rhythm Ap/Vs, Vp @ 77bpm. Histograms appropriate. No episodes. AP 100%, Vp 78% since last check on 08/03/19. Advised to have pt change positions slowly, check BP during a dizzy spell if possible. Will route to Dr. Lovena Le for recommendations. Pt's wife in agreement with this plan, no further questions at this time.

## 2019-08-22 NOTE — Telephone Encounter (Signed)
I asked the pt wife to send a manual transmission with his home monitor. I told her as soon as we see the transmission one of Dr. Lovena Le nurses will give her a call back. Pt wife verbalized understanding.

## 2019-08-22 NOTE — Telephone Encounter (Signed)
Transmission received 08-22-2019

## 2019-08-22 NOTE — Telephone Encounter (Signed)
Spoke with wife who is concerned because pt is having increased dizziness since recent device adjustments.  He has not passed out but does have to grab hold of something and sit down quickly.  She is asking if he needs to come in for an adjustment or if anything can by phone.  Will forward to [place] and the device clinic for further eval and instructions.  Wife aware someone will contact her to discuss further.

## 2019-08-24 NOTE — Telephone Encounter (Signed)
I cannot tell what is going on. If he is still feeling badly, he will need to make an appointment.

## 2019-08-26 NOTE — Telephone Encounter (Signed)
Patient's wife returned call. Reports he is still having dizzy spells. She is agreeable to scheduling appointment with Dr. Lovena Le. Will route to scheduler for assistance.

## 2019-08-26 NOTE — Telephone Encounter (Signed)
LMOVM for pt's wife requesting call back to [place]. Advised I will also try home number.   LMOM at home number [facility]) advising that Dr. Lovena Le recommends pt schedule a f/u appointment if he is still having symptoms. [place] phone number for return call.

## 2019-09-10 ENCOUNTER — Other Ambulatory Visit: Payer: Self-pay | Admitting: Cardiology

## 2019-09-13 ENCOUNTER — Ambulatory Visit (INDEPENDENT_AMBULATORY_CARE_PROVIDER_SITE_OTHER): Payer: Medicare Other | Admitting: *Deleted

## 2019-09-13 ENCOUNTER — Telehealth: Payer: Self-pay | Admitting: Internal Medicine

## 2019-09-13 DIAGNOSIS — I495 Sick sinus syndrome: Secondary | ICD-10-CM

## 2019-09-13 DIAGNOSIS — I5032 Chronic diastolic (congestive) heart failure: Secondary | ICD-10-CM | POA: Diagnosis not present

## 2019-09-13 LAB — CUP PACEART REMOTE DEVICE CHECK
Battery Remaining Longevity: 5 mo
Battery Remaining Percentage: 7 %
Brady Statistic RA Percent Paced: 100 %
Brady Statistic RV Percent Paced: 80 %
Date Time Interrogation Session: 20200915083800
Implantable Lead Implant Date: 20130926
Implantable Lead Implant Date: 20130926
Implantable Lead Location: 753859
Implantable Lead Location: 753860
Implantable Lead Model: 4456
Implantable Lead Model: 4476
Implantable Lead Serial Number: 473023
Implantable Lead Serial Number: 523746
Implantable Pulse Generator Implant Date: 20130926
Lead Channel Impedance Value: 338 Ohm
Lead Channel Impedance Value: 396 Ohm
Lead Channel Pacing Threshold Amplitude: 0.7 V
Lead Channel Pacing Threshold Pulse Width: 0.3 ms
Lead Channel Setting Pacing Amplitude: 2 V
Lead Channel Setting Pacing Amplitude: 2.5 V
Lead Channel Setting Pacing Pulse Width: 0.4 ms
Lead Channel Setting Sensing Sensitivity: 2.5 mV
Pulse Gen Serial Number: 138586

## 2019-09-13 NOTE — Telephone Encounter (Signed)
Call returned to wife.  Advised ok to accompany Pt to appt.

## 2019-09-13 NOTE — Telephone Encounter (Signed)
New Message:       Wife called and said pt has an appointment tomrorrow(09-14-19) with Dr Lovena Le. She said she will need to go in ith pt, he can not hear well.

## 2019-09-14 ENCOUNTER — Encounter (INDEPENDENT_AMBULATORY_CARE_PROVIDER_SITE_OTHER): Payer: Self-pay

## 2019-09-14 ENCOUNTER — Ambulatory Visit (INDEPENDENT_AMBULATORY_CARE_PROVIDER_SITE_OTHER): Payer: Medicare Other | Admitting: Internal Medicine

## 2019-09-14 ENCOUNTER — Encounter: Payer: Self-pay | Admitting: Internal Medicine

## 2019-09-14 ENCOUNTER — Other Ambulatory Visit: Payer: Self-pay

## 2019-09-14 VITALS — BP 116/64 | HR 70 | Ht 70.0 in | Wt 214.0 lb

## 2019-09-14 DIAGNOSIS — I495 Sick sinus syndrome: Secondary | ICD-10-CM | POA: Diagnosis not present

## 2019-09-14 DIAGNOSIS — R42 Dizziness and giddiness: Secondary | ICD-10-CM | POA: Insufficient documentation

## 2019-09-14 DIAGNOSIS — Z950 Presence of cardiac pacemaker: Secondary | ICD-10-CM | POA: Diagnosis not present

## 2019-09-14 MED ORDER — FUROSEMIDE 20 MG PO TABS: 20.0000 mg | ORAL_TABLET | Freq: Every day | ORAL | 3 refills | Status: DC | PRN

## 2019-09-14 MED ORDER — DOXAZOSIN MESYLATE 4 MG PO TABS: 2.0000 mg | ORAL_TABLET | Freq: Every day | ORAL | 3 refills | Status: DC

## 2019-09-14 NOTE — Patient Instructions (Addendum)
Medication Instructions:  Your physician recommends that you continue on your current medications as directed. Please refer to the Current Medication list given to you today.  1.  Reduce your doxazosin (Cardura) 4 mg tablet--Take 1/2 tablet by mouth daily  2.  Lasix (furosemide) 20 mg---JUST take as needed for swelling or shortness of breath   Labwork: None ordered.  Testing/Procedures: None ordered.  Follow-Up: Your physician wants you to follow-up in: one year with Dr. Lovena Le.   You will receive a reminder letter in the mail two months in advance. If you don't receive a letter, please call our office to schedule the follow-up appointment.  Remote monitoring is used to monitor your Pacemaker from home. This monitoring reduces the number of office visits required to check your device to one time per year. It allows [place] to keep an eye on the functioning of your device to ensure it is working properly. You are scheduled for a device check from home on 10/18/2019. You may send your transmission at any time that day. If you have a wireless device, the transmission will be sent automatically. After your physician reviews your transmission, you will receive a postcard with your next transmission date.  Any Other Special Instructions Will Be Listed Below (If Applicable).  If you need a refill on your cardiac medications before your next appointment, please call your pharmacy.

## 2019-09-14 NOTE — Progress Notes (Signed)
HPI Jacob Warner returns today for ongoing evaluation and management of his PPM. He is a pleasant 85 yo man who has a h/o PAF, sinus node dysfunction, s/p PPM insertion. He feels well with no chest pain. He has minimal dyspnea. No anginal symptoms or chest pain. His main complaint has been dizziness. When we saw him last, we adjusted his PPM but he has been dizzy when he gets up and walks around. Allergies  Allergen Reactions  . Sulfa Antibiotics Hives and Rash    Bactrim  . Sulfacetamide Sodium Hives and Rash    Bactrim     Current Outpatient Medications  Medication Sig Dispense Refill  . acetaminophen (TYLENOL) 500 MG tablet Take 1,000 mg by mouth every 6 (six) hours as needed (pain).     Marland Kitchen amiodarone (PACERONE) 200 MG tablet TAKE 1 TABLET (200 MG TOTAL) BY MOUTH AS DIRECTED. TAKE ONE TABLET BY MOUTH MONDAY THROUGH FRIDAY 70 tablet 3  . aspirin 81 MG tablet Take 81 mg by mouth daily.    . ciprofloxacin (CIPRO) 500 MG tablet Take 1 tablet (500 mg total) by mouth 2 (two) times daily. Take for 5 days only. 10 tablet 0  . Cyanocobalamin (VITAMIN B-12 PO) Take 1 tablet by mouth daily.    Marland Kitchen denosumab (PROLIA) 60 MG/ML SOSY injection Inject 60 mg into the skin every 6 (six) months.    . docusate sodium (COLACE) 100 MG capsule Take 100 mg by mouth daily.    Marland Kitchen dutasteride (AVODART) 0.5 MG capsule Take 0.5 mg by mouth daily.    Marland Kitchen gabapentin (NEURONTIN) 300 MG capsule Take 300 mg by mouth every morning.     Marland Kitchen HYDROcodone-acetaminophen (NORCO) 5-325 MG tablet Take 1 tablet by mouth every 4 (four) hours as needed. 10 tablet 0  . hydroxypropyl methylcellulose / hypromellose (ISOPTO TEARS / GONIOVISC) 2.5 % ophthalmic solution Place 1 drop into both eyes as needed for dry eyes.    Marland Kitchen lactulose (CHRONULAC) 10 GM/15ML solution Take 10 g by mouth daily.   0  . latanoprost (XALATAN) 0.005 % ophthalmic solution PLACE 1 DROP INTO BOTH EYES AT BEDTIME  11  . Magnesium Oxide 400 (240 Mg) MG TABS Take 1  tablet by mouth 2 (two) times daily.  7  . predniSONE (DELTASONE) 10 MG tablet TAKE 4 TABLETS BY MOUTH EVERY DAY FOR 5 DAYS  0  . RESTASIS 0.05 % ophthalmic emulsion PLACE 1 DROP INTO BOTH EYES 2 TIMES DAILY.  11  . traZODone (DESYREL) 50 MG tablet TAKE 1 TO 2 TABLETS AT BEDTIME AS NEEDED FOR INSOMNIA  11  . Vitamin D, Ergocalciferol, (DRISDOL) 50000 units CAPS capsule Take 1 capsule by mouth every Tuesday.     . White Petrolatum-Mineral Oil (EYE LUBRICANT) OINT Apply 1 strip to eye at bedtime.    Marland Kitchen doxazosin (CARDURA) 4 MG tablet Take 0.5 tablets (2 mg total) by mouth daily. 45 tablet 3  . furosemide (LASIX) 20 MG tablet Take 1 tablet (20 mg total) by mouth daily as needed for fluid. 90 tablet 3  . nitroGLYCERIN (NITROSTAT) 0.4 MG SL tablet Place 1 tablet (0.4 mg total) under the tongue every 5 (five) minutes as needed for chest pain. 25 tablet 5   Current Facility-Administered Medications  Medication Dose Route Frequency Provider Last Rate Last Dose  . methylPREDNISolone acetate (DEPO-MEDROL) injection 80 mg  80 mg Other Once Magnus Sinning, MD         Past Medical History:  Diagnosis Date  . A-fib ([place])   . Alopecia   . Anemia   . Bradycardia   . Cancer ([place])   . Cherry angioma   . Echocardiogram    Echo 11/19: Mild concentric LVH, EF 50-55, normal wall motion, grade 1 diastolic dysfunction, mild AI, MAC, mild MR, normal RVSF, moderate TR, PASP 36  . GERD (gastroesophageal reflux disease)   . Glaucoma   . Hypertension   . Nuclear stress test    Nuclear stress test 11/19: not gated, inf and apical defect c/w small scar and/or soft tissue attenuation  . Pelvic fracture ([place])   . Right BBB/left ant fasc block   . Sick sinus syndrome ([place])   . TIA (transient ischemic attack)   . Xeroderma     ROS:   All systems reviewed and negative except as noted in the HPI.   Past Surgical History:  Procedure Laterality Date  . APPENDECTOMY    . CERVICAL SPINE SURGERY    .  EXPLORATORY LAPAROTOMY    . LITHOTRIPSY    . PACEMAKER PLACEMENT       Family History  Problem Relation Age of Onset  . Brain cancer Sister      Social History   Socioeconomic History  . Marital status: Married    Spouse name: Not on file  . Number of children: Not on file  . Years of education: Not on file  . Highest education level: Not on file  Occupational History  . Not on file  Social Needs  . Financial resource strain: Not on file  . Food insecurity    Worry: Not on file    Inability: Not on file  . Transportation needs    Medical: Not on file    Non-medical: Not on file  Tobacco Use  . Smoking status: Never Smoker  . Smokeless tobacco: Never Used  Substance and Sexual Activity  . Alcohol use: No    Alcohol/week: 0.0 standard drinks  . Drug use: No  . Sexual activity: Not on file  Lifestyle  . Physical activity    Days per week: Not on file    Minutes per session: Not on file  . Stress: Not on file  Relationships  . Social Herbalist on phone: Not on file    Gets together: Not on file    Attends religious service: Not on file    Active member of club or organization: Not on file    Attends meetings of clubs or organizations: Not on file    Relationship status: Not on file  . Intimate partner violence    Fear of current or ex partner: Not on file    Emotionally abused: Not on file    Physically abused: Not on file    Forced sexual activity: Not on file  Other Topics Concern  . Not on file  Social History Narrative  . Not on file     BP 116/64   Pulse 70   Ht _0  (1.778 m)   Wt 214 lb (97.1 kg)   SpO2 94%   BMI 30.71 kg/m   Physical Exam:  Well appearing NAD HEENT: Unremarkable Neck:  No JVD, no thyromegally Lymphatics:  No adenopathy Back:  No CVA tenderness Lungs:  Clear with no wheezes HEART:  Regular rate rhythm, no murmurs, no rubs, no clicks Abd:  soft, positive bowel sounds, no organomegally, no rebound, no  guarding Ext:  2 plus pulses, no  edema, no cyanosis, no clubbing Skin:  No rashes no nodules Neuro:  CN II through XII intact, motor grossly intact  DEVICE  Normal device function.  See PaceArt for details.   Assess/Plan: 1. Dizziness - we did orthostatic vitals today and his bp dropped 40 points. I have asked him to stop the lasix and take only on an as needed basis. We also asked him to reduce his dose of cardura to 2 mg daily. 2. Sick sinus syndrome - he is asymptomatic,s/p PPM insertion with atrial pacing 3. PAF - he is maintaining NSR.  4. PPM - his [employer] PPM has been evaluated and is working normally.   Mikle Bosworth.D.

## 2019-09-20 NOTE — Progress Notes (Signed)
Remote pacemaker transmission.   

## 2019-10-18 ENCOUNTER — Ambulatory Visit (INDEPENDENT_AMBULATORY_CARE_PROVIDER_SITE_OTHER): Payer: Medicare Other | Admitting: *Deleted

## 2019-10-18 ENCOUNTER — Telehealth: Payer: Self-pay | Admitting: Physical Medicine and Rehabilitation

## 2019-10-18 DIAGNOSIS — I5032 Chronic diastolic (congestive) heart failure: Secondary | ICD-10-CM

## 2019-10-18 DIAGNOSIS — I480 Paroxysmal atrial fibrillation: Secondary | ICD-10-CM

## 2019-10-18 LAB — CUP PACEART REMOTE DEVICE CHECK
Battery Remaining Longevity: 3 mo
Battery Remaining Percentage: 4 %
Brady Statistic RA Percent Paced: 100 %
Brady Statistic RV Percent Paced: 85 %
Date Time Interrogation Session: 20201020054700
Implantable Lead Implant Date: 20130926
Implantable Lead Implant Date: 20130926
Implantable Lead Location: 753859
Implantable Lead Location: 753860
Implantable Lead Model: 4456
Implantable Lead Model: 4476
Implantable Lead Serial Number: 473023
Implantable Lead Serial Number: 523746
Implantable Pulse Generator Implant Date: 20130926
Lead Channel Impedance Value: 352 Ohm
Lead Channel Impedance Value: 398 Ohm
Lead Channel Pacing Threshold Amplitude: 0.7 V
Lead Channel Pacing Threshold Pulse Width: 0.3 ms
Lead Channel Setting Pacing Amplitude: 2 V
Lead Channel Setting Pacing Amplitude: 2.5 V
Lead Channel Setting Pacing Pulse Width: 0.4 ms
Lead Channel Setting Sensing Sensitivity: 2.5 mV
Pulse Gen Serial Number: 138586

## 2019-10-18 NOTE — Telephone Encounter (Signed)
L4 tf esi 

## 2019-10-18 NOTE — Telephone Encounter (Signed)
Left message #1

## 2019-10-18 NOTE — Telephone Encounter (Signed)
Called patient to ask about weakness and bowel or bladder problems and to schedule a work in appointment. No answer after multiple rings, and no voicemail option.

## 2019-10-18 NOTE — Telephone Encounter (Signed)
See if Louanne Skye can see him in OV if he has a slot avail. If not then me OV. If he is having focal weakness or bowel or bladder symptoms ( new) then ED potentially.

## 2019-10-18 NOTE — Telephone Encounter (Signed)
Patient's wife states that the patient fell a little over a week ago and "severely injured his back." Ok to schedule vs work in for eval?

## 2019-10-18 NOTE — Telephone Encounter (Signed)
We do not have anything available currently

## 2019-10-18 NOTE — Telephone Encounter (Signed)
See below. Is there anywhere [name] can work him in? If not, let me know and I will see what I can do on Dr. Romona Curls schedule.

## 2019-10-19 NOTE — Telephone Encounter (Signed)
Scheduled for OV Thursday at [place].

## 2019-10-20 ENCOUNTER — Other Ambulatory Visit: Payer: Self-pay

## 2019-10-20 ENCOUNTER — Ambulatory Visit (INDEPENDENT_AMBULATORY_CARE_PROVIDER_SITE_OTHER): Payer: Medicare Other | Admitting: Physical Medicine and Rehabilitation

## 2019-10-20 ENCOUNTER — Ambulatory Visit: Payer: Self-pay

## 2019-10-20 ENCOUNTER — Encounter: Payer: Self-pay | Admitting: Physical Medicine and Rehabilitation

## 2019-10-20 VITALS — BP 107/51 | HR 80 | Ht 70.0 in | Wt 208.0 lb

## 2019-10-20 DIAGNOSIS — Z9689 Presence of other specified functional implants: Secondary | ICD-10-CM

## 2019-10-20 DIAGNOSIS — S39012A Strain of muscle, fascia and tendon of lower back, initial encounter: Secondary | ICD-10-CM | POA: Diagnosis not present

## 2019-10-20 DIAGNOSIS — M545 Low back pain, unspecified: Secondary | ICD-10-CM

## 2019-10-20 DIAGNOSIS — M419 Scoliosis, unspecified: Secondary | ICD-10-CM

## 2019-10-20 DIAGNOSIS — M47816 Spondylosis without myelopathy or radiculopathy, lumbar region: Secondary | ICD-10-CM

## 2019-10-20 DIAGNOSIS — G8929 Other chronic pain: Secondary | ICD-10-CM

## 2019-10-20 NOTE — Progress Notes (Signed)
 .  Numeric Pain Rating Scale and Functional Assessment Average Pain 8 Pain Right Now 8 My pain is constant and sharp Pain is worse with: walking and standing Pain improves with: sitting   In the last MONTH (on 0-10 scale) has pain interfered with the following?  1. General activity like being  able to carry out your everyday physical activities such as walking, climbing stairs, carrying groceries, or moving a chair?  Rating(7)  2. Relation with others like being able to carry out your usual social activities and roles such as  activities at home, at work and in your community. Rating(7)  3. Enjoyment of life such that you have  been bothered by emotional problems such as feeling anxious, depressed or irritable?  Rating(4)

## 2019-10-28 ENCOUNTER — Other Ambulatory Visit: Payer: Self-pay | Admitting: Specialist

## 2019-11-03 NOTE — Progress Notes (Signed)
Remote pacemaker transmission.   

## 2019-11-04 ENCOUNTER — Ambulatory Visit: Payer: Medicare Other | Admitting: Cardiology

## 2019-11-04 ENCOUNTER — Ambulatory Visit
Admission: RE | Admit: 2019-11-04 | Discharge: 2019-11-04 | Disposition: A | Discharge status: Home / Self Care | Payer: Medicare Other | Source: Ambulatory Visit | Attending: Cardiology | Admitting: Cardiology

## 2019-11-04 ENCOUNTER — Encounter: Payer: Self-pay | Admitting: Cardiology

## 2019-11-04 ENCOUNTER — Other Ambulatory Visit: Payer: Self-pay

## 2019-11-04 VITALS — BP 124/66 | HR 70 | Ht 70.0 in | Wt 208.4 lb

## 2019-11-04 DIAGNOSIS — R05 Cough: Secondary | ICD-10-CM | POA: Diagnosis not present

## 2019-11-04 DIAGNOSIS — T462X1A Poisoning by other antidysrhythmic drugs, accidental (unintentional), initial encounter: Secondary | ICD-10-CM

## 2019-11-04 DIAGNOSIS — Z79899 Other long term (current) drug therapy: Secondary | ICD-10-CM | POA: Diagnosis not present

## 2019-11-04 DIAGNOSIS — R059 Cough, unspecified: Secondary | ICD-10-CM

## 2019-11-04 LAB — COMPREHENSIVE METABOLIC PANEL
ALT: 15 IU/L (ref 0–44)
AST: 15 IU/L (ref 0–40)
Albumin/Globulin Ratio: 2.1 (ref 1.2–2.2)
Albumin: 4.2 g/dL (ref 3.5–4.6)
Alkaline Phosphatase: 48 IU/L (ref 39–117)
BUN/Creatinine Ratio: 19 (ref 10–24)
BUN: 30 mg/dL (ref 10–36)
Bilirubin Total: 0.2 mg/dL (ref 0.0–1.2)
CO2: 26 mmol/L (ref 20–29)
Calcium: 9.4 mg/dL (ref 8.6–10.2)
Chloride: 101 mmol/L (ref 96–106)
Creatinine, Ser: 1.62 mg/dL — ABNORMAL HIGH (ref 0.76–1.27)
GFR calc Af Amer: 42 mL/min/{1.73_m2} — ABNORMAL LOW (ref >59)
GFR calc non Af Amer: 36 mL/min/{1.73_m2} — ABNORMAL LOW (ref >59)
Globulin, Total: 2 g/dL (ref 1.5–4.5)
Glucose: 97 mg/dL (ref 65–99)
Potassium: 4.3 mmol/L (ref 3.5–5.2)
Sodium: 144 mmol/L (ref 134–144)
Total Protein: 6.2 g/dL (ref 6.0–8.5)

## 2019-11-04 LAB — CBC
Hematocrit: 35.7 % — ABNORMAL LOW (ref 37.5–51.0)
Hemoglobin: 11.7 g/dL — ABNORMAL LOW (ref 13.0–17.7)
MCH: 32.5 pg (ref 26.6–33.0)
MCHC: 32.8 g/dL (ref 31.5–35.7)
MCV: 99 fL — ABNORMAL HIGH (ref 79–97)
Platelets: 176 10*3/uL (ref 150–450)
RBC: 3.6 x10E6/uL — ABNORMAL LOW (ref 4.14–5.80)
RDW: 11.8 % (ref 11.6–15.4)
WBC: 6.5 10*3/uL (ref 3.4–10.8)

## 2019-11-04 LAB — TSH: TSH: 3.85 u[IU]/mL (ref 0.450–4.500)

## 2019-11-04 NOTE — Patient Instructions (Signed)
Medication Instructions:   STOP TAKING AMIODARONE NOW  *If you need a refill on your cardiac medications before your next appointment, please call your pharmacy*   Lab Work:  TODAY--TSH, CMET, AND CBC W DIFF  If you have labs (blood work) drawn today and your tests are completely normal, you will receive your results only by: Marland Kitchen MyChart Message (if you have MyChart) OR . A paper copy in the mail If you have any lab test that is abnormal or we need to change your treatment, we will call you to review the results.   Testing/Procedures:  CHEST X-RAY TO BE DONE TODAY TO FURTHER EVALUATE YOUR COUGH--GO TO [place]   Follow-Up: At [facility], you and your health needs are our priority.  As part of our continuing mission to provide you with exceptional heart care, we have created designated Provider Care Teams.  These Care Teams include your primary Cardiologist (physician) and Advanced Practice Providers (APPs -  Physician Assistants and Nurse Practitioners) who all work together to provide you with the care you need, when you need it.  Your next appointment:   IN 3 MONTHS WITH DR. Meda Coffee ON February 08, 2020 AT HER 10 AM END SLOT--SCHEDULING PLEASE ADD TO THIS DATE AND TIME AND PUT OK PER KN AND IVY  The format for your next appointment:   In Person  Provider:   Ena Dawley, MD

## 2019-11-04 NOTE — Progress Notes (Signed)
11/04/2019 Jacob Warner   Mar 26, 1924  [id number]  Primary Physician [name], MD Primary Cardiologist: Dr. Meda Coffee  Electrophysiologist: Dr. Lovena Le   Reason for Visit/CC: 1 year follow up  HPI:  Jacob Warner is a 85 y.o. male with prior medical history of paroxysmal atrial fibrillation, SSS, status post pacemaker implantation, followed by Dr. Lovena Le. The patient has a past history of having had nodular lymphoma in 2008. This was treated successfully with chemotherapy. He has been in remission. His oncologist here in [place] is Dr. Julien Nordmann. Also no history of HTN or DM. There has also been concern for PAF, thus he has been treated with low dose Amiodarone. He is on ASA only. He has a h/o falls thus not on a/c.  December 17, 2018 -the patient is doing well, he denies any palpitations, he gets additional orthostatic hypotension however that resolves when he sits back.  No falls.  No chest pain shortness of breath.  He has chronic lower extremity edema that is mild.  No orthopnea proximal nocturnal dyspnea.  He has frequent recurrent UTIs and developed significant burning this morning and went to the urgent care however had to leave to make this appointment after waiting for 4 hours and not being seen.  He denies any fever or chills.  11/04/2019 -this is 1 year from admission, intravenous of Dr. Lovena Le for pacemaker checkup that is functioning well, the patient well except for worsening shortness of breath and dry cough that he developed several months ago.  He denies any fever or chills.  He gets occasional mild lower extremity for which he takes Lasix 20 mg daily.  He gets dizzy when he gets up fast and reports to one fall that he tripped over curb side.  Current Meds  Medication Sig  . acetaminophen (TYLENOL) 500 MG tablet Take 1,000 mg by mouth every 6 (six) hours as needed (pain).   Marland Kitchen amiodarone (PACERONE) 200 MG tablet TAKE 1 TABLET (200 MG TOTAL) BY MOUTH AS DIRECTED. TAKE ONE  TABLET BY MOUTH [place]  . aspirin 81 MG tablet Take 81 mg by mouth daily.  . Cyanocobalamin (VITAMIN B-12 PO) Take 1 tablet by mouth daily.  Marland Kitchen denosumab (PROLIA) 60 MG/ML SOSY injection Inject 60 mg into the skin every 6 (six) months.  . doxazosin (CARDURA) 4 MG tablet Take 0.5 tablets (2 mg total) by mouth daily.  Marland Kitchen dutasteride (AVODART) 0.5 MG capsule Take 0.5 mg by mouth daily.  . fluocinolone (SYNALAR) 0.025 % ointment APPLY TO AFFECTED AREA TWICE A DAY  . furosemide (LASIX) 20 MG tablet Take 1 tablet (20 mg total) by mouth daily as needed for fluid.  Marland Kitchen gabapentin (NEURONTIN) 300 MG capsule Take 300 mg by mouth every morning.   . hydroxypropyl methylcellulose / hypromellose (ISOPTO TEARS / GONIOVISC) 2.5 % ophthalmic solution Place 1 drop into both eyes as needed for dry eyes.  Marland Kitchen lactulose (CHRONULAC) 10 GM/15ML solution Take 10 g by mouth daily.   Marland Kitchen latanoprost (XALATAN) 0.005 % ophthalmic solution PLACE 1 DROP INTO BOTH EYES AT BEDTIME  . Magnesium Oxide 400 (240 Mg) MG TABS Take 1 tablet by mouth 2 (two) times daily.  . pantoprazole (PROTONIX) 40 MG tablet Take 40 mg by mouth daily.  . RESTASIS 0.05 % ophthalmic emulsion PLACE 1 DROP INTO BOTH EYES 2 TIMES DAILY.  . traZODone (DESYREL) 50 MG tablet TAKE 1 TO 2 TABLETS AT BEDTIME AS NEEDED FOR INSOMNIA  . Vitamin D, Ergocalciferol, (DRISDOL)  50000 units CAPS capsule Take 1 capsule by mouth every Tuesday.   . White Petrolatum-Mineral Oil (EYE LUBRICANT) OINT Apply 1 strip to eye at bedtime.   Current Facility-Administered Medications for the 11/04/19 encounter (Office Visit) with [name], MD  Medication  . methylPREDNISolone acetate (DEPO-MEDROL) injection 80 mg   Allergies  Allergen Reactions  . Sulfa Antibiotics Hives and Rash    Bactrim  . Sulfacetamide Sodium Hives and Rash    Bactrim   Past Medical History:  Diagnosis Date  . A-fib ([place])   . Alopecia   . Anemia   . Bradycardia   . Cancer ([place])    . Cherry angioma   . Echocardiogram    Echo 11/19: Mild concentric LVH, EF 50-55, normal wall motion, grade 1 diastolic dysfunction, mild AI, MAC, mild MR, normal RVSF, moderate TR, PASP 36  . GERD (gastroesophageal reflux disease)   . Glaucoma   . Hypertension   . Nuclear stress test    Nuclear stress test 11/19: not gated, inf and apical defect c/w small scar and/or soft tissue attenuation  . Pelvic fracture ([place])   . Right BBB/left ant fasc block   . Sick sinus syndrome ([place])   . TIA (transient ischemic attack)   . Xeroderma    Family History  Problem Relation Age of Onset  . Brain cancer Sister    Past Surgical History:  Procedure Laterality Date  . APPENDECTOMY    . CERVICAL SPINE SURGERY    . EXPLORATORY LAPAROTOMY    . LITHOTRIPSY    . PACEMAKER PLACEMENT     Social History   Socioeconomic History  . Marital status: Married    Spouse name: Not on file  . Number of children: Not on file  . Years of education: Not on file  . Highest education level: Not on file  Occupational History  . Not on file  Social Needs  . Financial resource strain: Not on file  . Food insecurity    Worry: Not on file    Inability: Not on file  . Transportation needs    Medical: Not on file    Non-medical: Not on file  Tobacco Use  . Smoking status: Never Smoker  . Smokeless tobacco: Never Used  Substance and Sexual Activity  . Alcohol use: No    Alcohol/week: 0.0 standard drinks  . Drug use: No  . Sexual activity: Not on file  Lifestyle  . Physical activity    Days per week: Not on file    Minutes per session: Not on file  . Stress: Not on file  Relationships  . Social Herbalist on phone: Not on file    Gets together: Not on file    Attends religious service: Not on file    Active member of club or organization: Not on file    Attends meetings of clubs or organizations: Not on file    Relationship status: Not on file  . Intimate partner violence    Fear of  current or ex partner: Not on file    Emotionally abused: Not on file    Physically abused: Not on file    Forced sexual activity: Not on file  Other Topics Concern  . Not on file  Social History Narrative  . Not on file     Review of Systems: General: negative for chills, fever, night sweats or weight changes.  Cardiovascular: negative for chest pain, dyspnea on exertion, edema,  orthopnea, palpitations, paroxysmal nocturnal dyspnea or shortness of breath Dermatological: negative for rash Respiratory: negative for cough or wheezing Urologic: negative for hematuria Abdominal: negative for nausea, vomiting, diarrhea, bright red blood per rectum, melena, or hematemesis Neurologic: negative for visual changes, syncope, or dizziness All other systems reviewed and are otherwise negative except as noted above.  Physical Exam:  Blood pressure 124/66, pulse 70, height _0  (1.778 m), weight 208 lb 6.4 oz (94.5 kg), SpO2 95 %.  General appearance: alert, cooperative and no distress Neck: no carotid bruit and no JVD Lungs: clear to auscultation bilaterally Heart: regular rate and rhythm, S1, S2 normal, no murmur, click, rub or gallop Extremities: mild bilateral LEE Pulses: 2+ and symmetric Skin: Skin color, texture, turgor normal. No rashes or lesions Neurologic: Grossly normal  EKG a sensed, V paced rhythm with left bundle branch block, unchanged from prior - personally reviewed    ASSESSMENT AND PLAN:   1. SSS: s/p [employer] PPM implant in 2013 at [facility]. Now followed by Dr. Lovena Le.  Functioning well.  2. PAF: maintaining NSR on low dose amiodarone.  He now has worsening shortness of breath, dry cough, I will discontinue amiodarone obtain chest x-ray.  He follows with eye doctor every 6 months and has normal report.  I will obtain TSH today.    3.  Dizziness - he is advised to increase his fluid intake.  We will obtain labs today including CMP, BNP, CBC and TSH.  f/u with  Dr. Meda Coffee in 3 months.    Ena Dawley, MD [facility] 11/04/2019 9:43 AM

## 2019-11-10 ENCOUNTER — Telehealth: Payer: Self-pay | Admitting: Internal Medicine

## 2019-11-10 ENCOUNTER — Other Ambulatory Visit: Payer: Self-pay

## 2019-11-10 MED ORDER — AZITHROMYCIN 250 MG PO TABS: ORAL_TABLET | ORAL | 0 refills | Status: DC

## 2019-11-10 NOTE — Telephone Encounter (Signed)
The patient and his wife Jacob Warner) are calling because since the Amiodarone was stopped on his 11/6 OV, the patient's SOB has worsened, even at rest.  It has really gotten bad the past 4-5 days.  He also experiences occasional/intermittent CP. Please advise, thank you.

## 2019-11-10 NOTE — Telephone Encounter (Signed)
New message     Pt c/o Shortness Of Breath: STAT if SOB developed within the last 24 hours or pt is noticeably SOB on the phone  1. Are you currently SOB (can you hear that pt is SOB on the phone)? Yes   2. How long have you been experiencing SOB?3 weeks  3. Are you SOB when sitting or when up moving around? both  4. Are you currently experiencing any other symptoms?n/a

## 2019-11-10 NOTE — Telephone Encounter (Signed)
I spoke to the patient's wife with Dr Francesca Oman recommendation for Z-Pack.  They verbalized understanding.

## 2019-11-10 NOTE — Telephone Encounter (Signed)
Please start him on z-pack for 3 days.

## 2019-11-15 ENCOUNTER — Ambulatory Visit (INDEPENDENT_AMBULATORY_CARE_PROVIDER_SITE_OTHER): Payer: Medicare Other | Admitting: *Deleted

## 2019-11-15 DIAGNOSIS — I5032 Chronic diastolic (congestive) heart failure: Secondary | ICD-10-CM

## 2019-11-15 DIAGNOSIS — I495 Sick sinus syndrome: Secondary | ICD-10-CM

## 2019-11-16 LAB — CUP PACEART REMOTE DEVICE CHECK
Battery Remaining Longevity: 3 mo
Battery Remaining Percentage: 4 %
Brady Statistic RA Percent Paced: 100 %
Brady Statistic RV Percent Paced: 86 %
Date Time Interrogation Session: 20201117160600
Implantable Lead Implant Date: 20130926
Implantable Lead Implant Date: 20130926
Implantable Lead Location: 753859
Implantable Lead Location: 753860
Implantable Lead Model: 4456
Implantable Lead Model: 4476
Implantable Lead Serial Number: 473023
Implantable Lead Serial Number: 523746
Implantable Pulse Generator Implant Date: 20130926
Lead Channel Impedance Value: 402 Ohm
Lead Channel Impedance Value: 449 Ohm
Lead Channel Pacing Threshold Amplitude: 0.7 V
Lead Channel Pacing Threshold Pulse Width: 0.3 ms
Lead Channel Setting Pacing Amplitude: 2 V
Lead Channel Setting Pacing Amplitude: 2.5 V
Lead Channel Setting Pacing Pulse Width: 0.4 ms
Lead Channel Setting Sensing Sensitivity: 2.5 mV
Pulse Gen Serial Number: 138586

## 2019-12-13 ENCOUNTER — Ambulatory Visit (INDEPENDENT_AMBULATORY_CARE_PROVIDER_SITE_OTHER): Payer: Medicare Other | Admitting: *Deleted

## 2019-12-13 DIAGNOSIS — R001 Bradycardia, unspecified: Secondary | ICD-10-CM

## 2019-12-13 LAB — CUP PACEART REMOTE DEVICE CHECK
Battery Remaining Longevity: 3 mo
Battery Remaining Percentage: 5 %
Brady Statistic RA Percent Paced: 100 %
Brady Statistic RV Percent Paced: 85 %
Date Time Interrogation Session: 20201215003300
Implantable Lead Implant Date: 20130926
Implantable Lead Implant Date: 20130926
Implantable Lead Location: 753859
Implantable Lead Location: 753860
Implantable Lead Model: 4456
Implantable Lead Model: 4476
Implantable Lead Serial Number: 473023
Implantable Lead Serial Number: 523746
Implantable Pulse Generator Implant Date: 20130926
Lead Channel Impedance Value: 384 Ohm
Lead Channel Impedance Value: 458 Ohm
Lead Channel Pacing Threshold Amplitude: 0.7 V
Lead Channel Pacing Threshold Pulse Width: 0.3 ms
Lead Channel Setting Pacing Amplitude: 2 V
Lead Channel Setting Pacing Amplitude: 2.5 V
Lead Channel Setting Pacing Pulse Width: 0.4 ms
Lead Channel Setting Sensing Sensitivity: 2.5 mV
Pulse Gen Serial Number: 138586

## 2019-12-13 NOTE — Addendum Note (Signed)
Addended by: Douglass Rivers D on: 12/13/2019 04:21 PM   Modules accepted: Level of Service

## 2019-12-13 NOTE — Progress Notes (Signed)
Remote pacemaker transmission.   

## 2020-01-14 NOTE — Progress Notes (Signed)
PPM remote 

## 2020-01-17 ENCOUNTER — Ambulatory Visit (INDEPENDENT_AMBULATORY_CARE_PROVIDER_SITE_OTHER): Payer: Medicare PPO | Admitting: *Deleted

## 2020-01-17 DIAGNOSIS — I5032 Chronic diastolic (congestive) heart failure: Secondary | ICD-10-CM | POA: Diagnosis not present

## 2020-01-17 LAB — CUP PACEART REMOTE DEVICE CHECK
Battery Remaining Longevity: 3 mo — CL
Battery Remaining Percentage: 2 %
Brady Statistic RA Percent Paced: 100 %
Brady Statistic RV Percent Paced: 81 %
Date Time Interrogation Session: 20210119020100
Implantable Lead Implant Date: 20130926
Implantable Lead Implant Date: 20130926
Implantable Lead Location: 753859
Implantable Lead Location: 753860
Implantable Lead Model: 4456
Implantable Lead Model: 4476
Implantable Lead Serial Number: 473023
Implantable Lead Serial Number: 523746
Implantable Pulse Generator Implant Date: 20130926
Lead Channel Impedance Value: 353 Ohm
Lead Channel Impedance Value: 428 Ohm
Lead Channel Pacing Threshold Amplitude: 0.7 V
Lead Channel Pacing Threshold Pulse Width: 0.3 ms
Lead Channel Setting Pacing Amplitude: 2 V
Lead Channel Setting Pacing Amplitude: 2.5 V
Lead Channel Setting Pacing Pulse Width: 0.4 ms
Lead Channel Setting Sensing Sensitivity: 2.5 mV
Pulse Gen Serial Number: 138586

## 2020-02-07 NOTE — Progress Notes (Signed)
11/04/2019 Jacob Warner   09/15/1924  [id number]  Primary Physician [name], MD Primary Cardiologist: Dr. Meda Coffee  Electrophysiologist: Dr. Lovena Le   Reason for Visit/CC: 1 year follow up  HPI:  Jacob Warner is a 86 y.o. male with prior medical history of paroxysmal atrial fibrillation, SSS, status post pacemaker implantation, followed by Dr. Lovena Le. The patient has a past history of having had nodular lymphoma in 2008. This was treated successfully with chemotherapy. He has been in remission. His oncologist here in [place] is Dr. Julien Nordmann. Also no history of HTN or DM. There has also been concern for PAF, thus he has been treated with low dose Amiodarone. He is on ASA only. He has a h/o falls thus not on a/c.  11/04/2019 -this is 1 year from admission, intravenous of Dr. Lovena Le for pacemaker checkup that is functioning well, the patient well except for worsening shortness of breath and dry cough that he developed several months ago.  He denies any fever or chills.  He gets occasional mild lower extremity for which he takes Lasix 20 mg daily.  He gets dizzy when he gets up fast and reports to one fall that he tripped over curb side.  02/08/2020 - the patient is coming after 3 months, he states that he is feeling progressively more short of breath with walking, he states he can hardly do anything, no chest pain, he gets orthostatic hypotension when he gets up, he fell on 1 occasion when he tripped over concrete steps.  He denies any palpitations.  He has stable minimal lower extremity edema.   Current Meds  Medication Sig  . acetaminophen (TYLENOL) 500 MG tablet Take 1,000 mg by mouth every 6 (six) hours as needed (pain).   Marland Kitchen amiodarone (PACERONE) 200 MG tablet TAKE 1 TABLET (200 MG TOTAL) BY MOUTH AS DIRECTED. TAKE ONE TABLET BY MOUTH [place]  . aspirin 81 MG tablet Take 81 mg by mouth daily.  . Cyanocobalamin (VITAMIN B-12 PO) Take 1 tablet by mouth daily.  Marland Kitchen  denosumab (PROLIA) 60 MG/ML SOSY injection Inject 60 mg into the skin every 6 (six) months.  . doxazosin (CARDURA) 4 MG tablet Take 0.5 tablets (2 mg total) by mouth daily.  Marland Kitchen dutasteride (AVODART) 0.5 MG capsule Take 0.5 mg by mouth daily.  . fluocinolone (SYNALAR) 0.025 % ointment APPLY TO AFFECTED AREA TWICE A DAY  . furosemide (LASIX) 20 MG tablet Take 1 tablet (20 mg total) by mouth daily as needed for fluid.  Marland Kitchen gabapentin (NEURONTIN) 300 MG capsule Take 300 mg by mouth every morning.   . hydroxypropyl methylcellulose / hypromellose (ISOPTO TEARS / GONIOVISC) 2.5 % ophthalmic solution Place 1 drop into both eyes as needed for dry eyes.  Marland Kitchen lactulose (CHRONULAC) 10 GM/15ML solution Take 10 g by mouth daily.   Marland Kitchen latanoprost (XALATAN) 0.005 % ophthalmic solution PLACE 1 DROP INTO BOTH EYES AT BEDTIME  . Magnesium Oxide 400 (240 Mg) MG TABS Take 1 tablet by mouth 2 (two) times daily.  . pantoprazole (PROTONIX) 40 MG tablet Take 40 mg by mouth daily.  . RESTASIS 0.05 % ophthalmic emulsion PLACE 1 DROP INTO BOTH EYES 2 TIMES DAILY.  . traZODone (DESYREL) 50 MG tablet TAKE 1 TO 2 TABLETS AT BEDTIME AS NEEDED FOR INSOMNIA  . Vitamin D, Ergocalciferol, (DRISDOL) 50000 units CAPS capsule Take 1 capsule by mouth every Tuesday.   . White Petrolatum-Mineral Oil (EYE LUBRICANT) OINT Apply 1 strip to eye at bedtime.  Current Facility-Administered Medications for the 11/04/19 encounter (Office Visit) with [name], MD  Medication  . methylPREDNISolone acetate (DEPO-MEDROL) injection 80 mg   Allergies  Allergen Reactions  . Sulfa Antibiotics Hives and Rash    Bactrim  . Sulfacetamide Sodium Hives and Rash    Bactrim   Past Medical History:  Diagnosis Date  . A-fib ([place])   . Alopecia   . Anemia   . Bradycardia   . Cancer ([place])   . Cherry angioma   . Echocardiogram    Echo 11/19: Mild concentric LVH, EF 50-55, normal wall motion, grade 1 diastolic dysfunction, mild AI, MAC, mild MR,  normal RVSF, moderate TR, PASP 36  . GERD (gastroesophageal reflux disease)   . Glaucoma   . Hypertension   . Nuclear stress test    Nuclear stress test 11/19: not gated, inf and apical defect c/w small scar and/or soft tissue attenuation  . Pelvic fracture ([place])   . Right BBB/left ant fasc block   . Sick sinus syndrome ([place])   . TIA (transient ischemic attack)   . Xeroderma    Family History  Problem Relation Age of Onset  . Brain cancer Sister    Past Surgical History:  Procedure Laterality Date  . APPENDECTOMY    . CERVICAL SPINE SURGERY    . EXPLORATORY LAPAROTOMY    . LITHOTRIPSY    . PACEMAKER PLACEMENT     Social History   Socioeconomic History  . Marital status: Married    Spouse name: Not on file  . Number of children: Not on file  . Years of education: Not on file  . Highest education level: Not on file  Occupational History  . Not on file  Social Needs  . Financial resource strain: Not on file  . Food insecurity    Worry: Not on file    Inability: Not on file  . Transportation needs    Medical: Not on file    Non-medical: Not on file  Tobacco Use  . Smoking status: Never Smoker  . Smokeless tobacco: Never Used  Substance and Sexual Activity  . Alcohol use: No    Alcohol/week: 0.0 standard drinks  . Drug use: No  . Sexual activity: Not on file  Lifestyle  . Physical activity    Days per week: Not on file    Minutes per session: Not on file  . Stress: Not on file  Relationships  . Social Herbalist on phone: Not on file    Gets together: Not on file    Attends religious service: Not on file    Active member of club or organization: Not on file    Attends meetings of clubs or organizations: Not on file    Relationship status: Not on file  . Intimate partner violence    Fear of current or ex partner: Not on file    Emotionally abused: Not on file    Physically abused: Not on file    Forced sexual activity: Not on file  Other Topics  Concern  . Not on file  Social History Narrative  . Not on file     Review of Systems: General: negative for chills, fever, night sweats or weight changes.  Cardiovascular: negative for chest pain, dyspnea on exertion, edema, orthopnea, palpitations, paroxysmal nocturnal dyspnea or shortness of breath Dermatological: negative for rash Respiratory: negative for cough or wheezing Urologic: negative for hematuria Abdominal: negative for nausea, vomiting, diarrhea,  bright red blood per rectum, melena, or hematemesis Neurologic: negative for visual changes, syncope, or dizziness All other systems reviewed and are otherwise negative except as noted above.  Physical Exam:  Blood pressure 124/66, pulse 70, height _0  (1.778 m), weight 208 lb 6.4 oz (94.5 kg), SpO2 95 %.  General appearance: alert, cooperative and no distress Neck: no carotid bruit and no JVD Lungs: clear to auscultation bilaterally Heart: regular rate and rhythm, S1, S2 normal, no murmur, click, rub or gallop Extremities: mild bilateral LEE Pulses: 2+ and symmetric Skin: Skin color, texture, turgor normal. No rashes or lesions Neurologic: Grossly normal  EKG not performed today.   ASSESSMENT AND PLAN:   1. SSS: s/p [employer] PPM implant in 2013 at [facility]. Now followed by Dr. Lovena Le.  Functioning well.  Last interrogation performed on January 17, 2020, there is no evidence for A. fib burden.  2. PAF: maintaining NSR.  He is now off amiodarone, I will obtain TSH today.  3.  Dizziness -I will discontinue Cardura  4.  Dyspnea on exertion -acute on chronic diastolic CHF.  We will obtain labs today including CMP, BNP, and TSH today if he is BNP is we will increase Lasix to 40 mg daily.  F/u with Dr. Meda Coffee in 3 months.    Ena Dawley, MD [facility] 11/04/2019 9:43 AM

## 2020-02-08 ENCOUNTER — Ambulatory Visit: Payer: Medicare PPO | Admitting: Cardiology

## 2020-02-08 ENCOUNTER — Other Ambulatory Visit: Payer: Self-pay

## 2020-02-08 ENCOUNTER — Telehealth: Payer: Self-pay | Admitting: Physical Medicine and Rehabilitation

## 2020-02-08 ENCOUNTER — Encounter: Payer: Self-pay | Admitting: Cardiology

## 2020-02-08 VITALS — BP 124/74 | HR 69 | Ht 70.0 in | Wt 214.4 lb

## 2020-02-08 DIAGNOSIS — I480 Paroxysmal atrial fibrillation: Secondary | ICD-10-CM

## 2020-02-08 DIAGNOSIS — R42 Dizziness and giddiness: Secondary | ICD-10-CM

## 2020-02-08 DIAGNOSIS — I5032 Chronic diastolic (congestive) heart failure: Secondary | ICD-10-CM | POA: Diagnosis not present

## 2020-02-08 DIAGNOSIS — Z950 Presence of cardiac pacemaker: Secondary | ICD-10-CM

## 2020-02-08 NOTE — Patient Instructions (Signed)
Medication Instructions:   STOP TAKING CARDURA NOW  *If you need a refill on your cardiac medications before your next appointment, please call your pharmacy*   Lab Work:  TODAY--CMET, PRO-BNP, AND TSH  If you have labs (blood work) drawn today and your tests are completely normal, you will receive your results only by: Marland Kitchen MyChart Message (if you have MyChart) OR . A paper copy in the mail If you have any lab test that is abnormal or we need to change your treatment, we will call you to review the results.   Follow-Up: At [facility], you and your health needs are our priority.  As part of our continuing mission to provide you with exceptional heart care, we have created designated Provider Care Teams.  These Care Teams include your primary Cardiologist (physician) and Advanced Practice Providers (APPs -  Physician Assistants and Nurse Practitioners) who all work together to provide you with the care you need, when you need it.  Your next appointment:   3 month(s)-- ON May 08, 2020 AT 9:20 AM WITH DR. Meda Coffee HERE IN THE OFFICE  The format for your next appointment:   In Person  Provider:   Ena Dawley, MD

## 2020-02-09 LAB — COMPREHENSIVE METABOLIC PANEL
ALT: 14 IU/L (ref 0–44)
AST: 24 IU/L (ref 0–40)
Albumin/Globulin Ratio: 2.3 — ABNORMAL HIGH (ref 1.2–2.2)
Albumin: 4.1 g/dL (ref 3.5–4.6)
Alkaline Phosphatase: 57 IU/L (ref 39–117)
BUN/Creatinine Ratio: 15 (ref 10–24)
BUN: 25 mg/dL (ref 10–36)
Bilirubin Total: 0.4 mg/dL (ref 0.0–1.2)
CO2: 24 mmol/L (ref 20–29)
Calcium: 8.8 mg/dL (ref 8.6–10.2)
Chloride: 103 mmol/L (ref 96–106)
Creatinine, Ser: 1.64 mg/dL — ABNORMAL HIGH (ref 0.76–1.27)
GFR calc Af Amer: 41 mL/min/{1.73_m2} — ABNORMAL LOW (ref >59)
GFR calc non Af Amer: 36 mL/min/{1.73_m2} — ABNORMAL LOW (ref >59)
Globulin, Total: 1.8 g/dL (ref 1.5–4.5)
Glucose: 92 mg/dL (ref 65–99)
Potassium: 4.7 mmol/L (ref 3.5–5.2)
Sodium: 141 mmol/L (ref 134–144)
Total Protein: 5.9 g/dL — ABNORMAL LOW (ref 6.0–8.5)

## 2020-02-09 LAB — TSH: TSH: 3.05 u[IU]/mL (ref 0.450–4.500)

## 2020-02-09 LAB — PRO B NATRIURETIC PEPTIDE: NT-Pro BNP: 377 pg/mL (ref 0–486)

## 2020-02-09 NOTE — Telephone Encounter (Signed)
Ok thanks 

## 2020-02-09 NOTE — Telephone Encounter (Signed)
Best to OV and local trigger point injection and eval for anything else, might need lumbar CT at some point

## 2020-02-09 NOTE — Telephone Encounter (Signed)
FYI- Patient is scheduled for an OV on 2/17. When I spoke with his wife yesterday she stated that he had had no recent trauma or injuries. The patient reported today that he fell flat on his back about 4 weeks ago.

## 2020-02-14 ENCOUNTER — Ambulatory Visit (INDEPENDENT_AMBULATORY_CARE_PROVIDER_SITE_OTHER): Payer: Medicare PPO | Admitting: *Deleted

## 2020-02-14 DIAGNOSIS — I5032 Chronic diastolic (congestive) heart failure: Secondary | ICD-10-CM

## 2020-02-14 LAB — CUP PACEART REMOTE DEVICE CHECK
Battery Remaining Longevity: 3 mo — CL
Battery Remaining Percentage: 1 %
Brady Statistic RA Percent Paced: 100 %
Brady Statistic RV Percent Paced: 81 %
Date Time Interrogation Session: 20210216140500
Implantable Lead Implant Date: 20130926
Implantable Lead Implant Date: 20130926
Implantable Lead Location: 753859
Implantable Lead Location: 753860
Implantable Lead Model: 4456
Implantable Lead Model: 4476
Implantable Lead Serial Number: 473023
Implantable Lead Serial Number: 523746
Implantable Pulse Generator Implant Date: 20130926
Lead Channel Impedance Value: 417 Ohm
Lead Channel Impedance Value: 483 Ohm
Lead Channel Pacing Threshold Amplitude: 0.7 V
Lead Channel Pacing Threshold Pulse Width: 0.3 ms
Lead Channel Setting Pacing Amplitude: 2 V
Lead Channel Setting Pacing Amplitude: 2.5 V
Lead Channel Setting Pacing Pulse Width: 0.4 ms
Lead Channel Setting Sensing Sensitivity: 2.5 mV
Pulse Gen Serial Number: 138586

## 2020-02-15 ENCOUNTER — Ambulatory Visit: Payer: Medicare Other | Admitting: Cardiology

## 2020-02-15 ENCOUNTER — Encounter: Payer: Self-pay | Admitting: Physical Medicine and Rehabilitation

## 2020-02-15 ENCOUNTER — Ambulatory Visit (INDEPENDENT_AMBULATORY_CARE_PROVIDER_SITE_OTHER): Payer: Medicare PPO

## 2020-02-15 ENCOUNTER — Other Ambulatory Visit: Payer: Self-pay

## 2020-02-15 ENCOUNTER — Ambulatory Visit (INDEPENDENT_AMBULATORY_CARE_PROVIDER_SITE_OTHER): Payer: Medicare PPO | Admitting: Physical Medicine and Rehabilitation

## 2020-02-15 VITALS — BP 111/63 | HR 74

## 2020-02-15 DIAGNOSIS — Z9689 Presence of other specified functional implants: Secondary | ICD-10-CM | POA: Diagnosis not present

## 2020-02-15 DIAGNOSIS — M47816 Spondylosis without myelopathy or radiculopathy, lumbar region: Secondary | ICD-10-CM | POA: Diagnosis not present

## 2020-02-15 DIAGNOSIS — W19XXXA Unspecified fall, initial encounter: Secondary | ICD-10-CM

## 2020-02-15 DIAGNOSIS — M545 Low back pain, unspecified: Secondary | ICD-10-CM

## 2020-02-15 DIAGNOSIS — G8929 Other chronic pain: Secondary | ICD-10-CM | POA: Diagnosis not present

## 2020-02-15 NOTE — Progress Notes (Signed)
PPM Remote  

## 2020-02-15 NOTE — Progress Notes (Signed)
Jacob Warner - 86 y.o. male MRN [id number]  Date of birth: [birth year]  Office Visit Note: Visit Date: 10/20/2019 PCP: [name], MD Referred by: [name], MD  Subjective: Chief Complaint  Patient presents with  . Lower Back - Pain   HPI: Jacob Warner is a 86 y.o. male who comes in today For evaluation management of chronic worsening axial low back pain which is bilateral.  This is a chronic condition for him but he does admit to a fall on the flat ground a couple of weeks ago.  He states he has had no x-rays since that fall.  He reports his pain is an 8 out of 10 very sharp and constant at this point which is worse than normal.  He does have spinal cord stimulator which is in place and has been using.  He reports no difficulty with that.  He continued to always have some pain that he thinks is around the pocket where the IPG was placed.  He gets pain with walking and standing is better with sitting.  No tailbone pain.  No hip or groin pain no weakness.  He has not had any other red flag complaints.  He does have congestive heart failure with pacemaker.  He does get dizzy at times when he is moving.  They have been adjusting medications.  Review of Systems  Constitutional: Negative for chills, fever, malaise/fatigue and weight loss.  HENT: Negative for hearing loss and sinus pain.   Eyes: Negative for blurred vision, double vision and photophobia.  Respiratory: Negative for cough and shortness of breath.   Cardiovascular: Negative for chest pain, palpitations and leg swelling.  Gastrointestinal: Negative for abdominal pain, nausea and vomiting.  Genitourinary: Negative for flank pain.  Musculoskeletal: Positive for back pain and joint pain. Negative for myalgias.  Skin: Negative for itching and rash.  Neurological: Negative for tremors, focal weakness and weakness.  Endo/Heme/Allergies: Negative.   Psychiatric/Behavioral: Negative for depression.  All other systems reviewed  and are negative.  Otherwise per HPI.  Assessment & Plan: Visit Diagnoses:  1. Chronic bilateral low back pain without sciatica   2. Spondylosis without myelopathy or radiculopathy, lumbar region   3. Lumbar strain, initial encounter   4. S/P insertion of spinal cord stimulator   5. Scoliosis of thoracolumbar spine, unspecified scoliosis type     Plan: Findings:  Chronic percent low back pain with history of multiple falls.  He has had prior pubic and sacral fractures and these are documented on prior CT scan from 2019.  New imaging today does not show any acute fracture.  This is more of a lumbar sprain strain type of issue.  He does have degenerative changes of the spine.  He is at a point where he has had spinal cord stimulator placed for pain relief.  He continues to take some medication at times and tries to stay active.  At this point I think the best approach is continue with medication and activity modification.  He was happy to see that there was no fractures.  Prior radiofrequency ablation was in June 2020 and I feel like that is probably still holding.  At this point we are going to see how he does over the next few weeks.  Would consider regrouping with physical therapy versus epidural injection.    Meds & Orders: No orders of the defined types were placed in this encounter.   Orders Placed This Encounter  Procedures  . XR  Lumbar Spine 2-3 Views  . XR Pelvis 1-2 Views    Follow-up: Return if symptoms worsen or fail to improve.   Procedures: No procedures performed  No notes on file   Clinical History: CT PELVIS WITHOUT CONTRAST  TECHNIQUE: Multidetector CT imaging of the pelvis was performed following the standard protocol without intravenous contrast.  COMPARISON:  CT of the abdomen and pelvis on 06/26/2016, plain films 07/25/2018  FINDINGS: Urinary Tract: Normal appearance of the urinary bladder and distal ureters.  Bowel: Nonobstructed bowel-gas pattern.  There are numerous radiopaque tablets within the cecum and descending colon. I count approximately 25 tablets in the region of the cecum. These are primarily uniform and size, measuring approximately 1 centimeter in diameter. Findings are consistent with stasis but also raise the possibility of numerous tablets ingested at a single time.  Vascular/Lymphatic: There is dense atherosclerotic calcification of the abdominal aorta and femoral arteries. No aneurysm. No retroperitoneal or mesenteric adenopathy.  Reproductive:  Prostatic calcifications are present.  Other: There is partially imaged soft tissue in the region of the superior mesenteric artery and vein, similar in appearance to the previous exams.  Musculoskeletal: Remote fractures of the superior and inferior pubic rami bilaterally. Remote LEFT sacral fracture. No evidence for acute fracture or subluxation. Nondisplaced fractures cannot be excluded. Small amount of stranding in the subcutaneous region of the LEFT buttock.  IMPRESSION: 1. No acute, displaced fracture. Depending on the clinical suspicion, MRI may be helpful in detecting an nondisplaced fracture. 2. Remote fractures of the LEFT sacrum, superior and inferior pubic rami bilaterally. 3. Numerous radiopaque tablets in the proximal colon. Although the findings could be related to stasis, overdose needs to be considered. 4. Persistent soft tissue in the region the superior mesenteric artery and vein. The appearance is nonspecific and stable. 5. These results were called by telephone at the time of interpretation on 07/29/2018 at 11:37 am to Dr. Basil Dess , who verbally acknowledged these results.   Electronically Signed   By: Nolon Nations M.D.   On: 07/29/2018 11:37   He reports that he has never smoked. He has never used smokeless tobacco. No results for input(s): HGBA1C, LABURIC in the last 8760 hours.  Objective:  VS:  HT:_0  (177.8 cm)    WT:208 lb (94.3 kg)  BMI:29.84    BP:(!) 107/51  HR:80bpm  TEMP: ( )  RESP:  Physical Exam Vitals and nursing note reviewed.  Constitutional:      General: He is not in acute distress.    Appearance: He is well-developed. He is obese.  HENT:     Head: Normocephalic and atraumatic.     Nose: Nose normal.     Mouth/Throat:     Mouth: Mucous membranes are moist.     Pharynx: Oropharynx is clear.  Eyes:     Conjunctiva/sclera: Conjunctivae normal.     Pupils: Pupils are equal, round, and reactive to light.  Neck:     Trachea: No tracheal deviation.  Cardiovascular:     Rate and Rhythm: Normal rate and regular rhythm.     Pulses: Normal pulses.  Pulmonary:     Effort: Pulmonary effort is normal.     Breath sounds: Normal breath sounds.  Abdominal:     General: There is no distension.     Palpations: Abdomen is soft.     Tenderness: There is no guarding or rebound.  Musculoskeletal:        General: No deformity.  Cervical back: Normal range of motion and neck supple.     Right lower leg: No edema.     Left lower leg: No edema.     Comments: Patient is slow to rise from a seated position to full extension.  He does seem to have some balance difficulty with walking.  Does ambulate with a cane.  He has good distal strength without clonus.  He has no pain with hip rotation.  He has stiff and does have concordant pain with facet loading.  He does have focal trigger point on the left.  Skin:    General: Skin is warm and dry.     Findings: No erythema or rash.  Neurological:     General: No focal deficit present.     Mental Status: He is alert and oriented to person, place, and time.     Motor: No abnormal muscle tone.     Coordination: Coordination normal.     Gait: Gait normal.  Psychiatric:        Mood and Affect: Mood normal.        Behavior: Behavior normal.        Thought Content: Thought content normal.     Ortho Exam Imaging: AP pelvis: AP of the pelvis shows  degenerative change of the lumbar spine of lumbar 2 segments with equal pelvic heights bilaterally.  There is some mild degenerative change of both hips without fracture.  There is chronic pubic ramus fracture and there is some sclerosis of the left sacroiliac joint.  IPG noted in the left iliac area.  AP lateral lumbar: Normal anatomic alignment on the lateral view with good lordosis with evident old compression fracture which compression fracture mild at L1.  There is thoracolumbar scoliosis centered at about L1 to with a rightward curvature.  He has degenerative changes throughout with degenerative disc height loss as well as facet arthritis moderate.  No listhesis.  Past Medical/Family/Surgical/Social History: Medications & Allergies reviewed per EMR, new medications updated. Patient Active Problem List   Diagnosis Date Noted  . S/P insertion of spinal cord stimulator 02/15/2020  . Spondylosis without myelopathy or radiculopathy, lumbar region 02/15/2020  . Dizzy 09/14/2019  . Chronic diastolic heart failure ([place]) 08/03/2019  . Cough 11/10/2018  . PAF (paroxysmal atrial fibrillation) ([place]) 11/02/2018  . De Quervain's tenosynovitis, right 02/09/2018  . Wrist pain, acute, right 02/09/2018  . Post laminectomy syndrome 03/19/2017  . Chronic pain syndrome 03/19/2017  . Chronic bilateral low back pain without sciatica 03/19/2017  . After cataract of both eyes not obscuring vision 03/03/2016  . 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 Primary open-angle glaucoma, right eye, indeterminate stage 03/03/2016  . Squamous blepharitis of upper and lower eyelids of both eyes 03/03/2016  . Bradycardia 05/31/2015  . Pacemaker 05/31/2015  . Peripheral edema 05/31/2015  . Heart murmur 05/31/2015  . Sick sinus syndrome ([place]) 05/31/2015  . Nonsustained ventricular tachycardia ([place]) 05/31/2015  . 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 A-fib ([place])     . Alopecia   . Anemia   . Bradycardia   . Cancer ([place])   . Cherry angioma   . Echocardiogram    Echo 11/19: Mild concentric LVH, EF 50-55, normal wall motion, grade 1 diastolic dysfunction, mild AI, MAC, mild MR, normal RVSF, moderate TR, PASP 36  . GERD (gastroesophageal reflux disease)   . Glaucoma   . Hypertension   . Nuclear  stress test    Nuclear stress test 11/19: not gated, inf and apical defect c/w small scar and/or soft tissue attenuation  . Pelvic fracture ([place])   . Right BBB/left ant fasc block   . Sick sinus syndrome ([place])   . TIA (transient ischemic attack)   . Xeroderma    Family History  Problem Relation Age of Onset  . Brain cancer Sister    Past Surgical History:  Procedure Laterality Date  . APPENDECTOMY    . CERVICAL SPINE SURGERY    . EXPLORATORY LAPAROTOMY    . LITHOTRIPSY    . PACEMAKER PLACEMENT     Social History   Occupational History  . Not on file  Tobacco Use  . Smoking status: Never Smoker  . Smokeless tobacco: Never Used  Substance and Sexual Activity  . Alcohol use: No    Alcohol/week: 0.0 standard drinks  . Drug use: No  . Sexual activity: Not on file

## 2020-02-15 NOTE — Progress Notes (Signed)
 .  Numeric Pain Rating Scale and Functional Assessment Average Pain 8   In the last MONTH (on 0-10 scale) has pain interfered with the following?  1. General activity like being  able to carry out your everyday physical activities such as walking, climbing stairs, carrying groceries, or moving a chair?  Rating(5)    

## 2020-03-13 ENCOUNTER — Ambulatory Visit (INDEPENDENT_AMBULATORY_CARE_PROVIDER_SITE_OTHER): Payer: Medicare PPO | Admitting: *Deleted

## 2020-03-13 DIAGNOSIS — I5032 Chronic diastolic (congestive) heart failure: Secondary | ICD-10-CM

## 2020-03-13 LAB — CUP PACEART REMOTE DEVICE CHECK
Battery Remaining Longevity: 3 mo — CL
Battery Remaining Percentage: 2 %
Brady Statistic RA Percent Paced: 100 %
Brady Statistic RV Percent Paced: 79 %
Date Time Interrogation Session: 20210316071500
Implantable Lead Implant Date: 20130926
Implantable Lead Implant Date: 20130926
Implantable Lead Location: 753859
Implantable Lead Location: 753860
Implantable Lead Model: 4456
Implantable Lead Model: 4476
Implantable Lead Serial Number: 473023
Implantable Lead Serial Number: 523746
Implantable Pulse Generator Implant Date: 20130926
Lead Channel Impedance Value: 406 Ohm
Lead Channel Impedance Value: 457 Ohm
Lead Channel Pacing Threshold Amplitude: 0.7 V
Lead Channel Pacing Threshold Pulse Width: 0.3 ms
Lead Channel Setting Pacing Amplitude: 2 V
Lead Channel Setting Pacing Amplitude: 2.5 V
Lead Channel Setting Pacing Pulse Width: 0.4 ms
Lead Channel Setting Sensing Sensitivity: 2.5 mV
Pulse Gen Serial Number: 138586

## 2020-03-14 NOTE — Progress Notes (Signed)
PPM Remote  

## 2020-04-06 ENCOUNTER — Telehealth: Payer: Self-pay

## 2020-04-06 NOTE — Telephone Encounter (Signed)
PM alert received, battery has reached ERI.  Advised pt scheduling will be calling to set up appt with Dr. Lovena Le.

## 2020-04-10 DIAGNOSIS — J309 Allergic rhinitis, unspecified: Secondary | ICD-10-CM | POA: Diagnosis not present

## 2020-04-10 DIAGNOSIS — G44209 Tension-type headache, unspecified, not intractable: Secondary | ICD-10-CM | POA: Diagnosis not present

## 2020-04-12 LAB — CUP PACEART REMOTE DEVICE CHECK
Brady Statistic RA Percent Paced: 100 %
Brady Statistic RV Percent Paced: 77 %
Date Time Interrogation Session: 20210415085300
Implantable Lead Implant Date: 20130926
Implantable Lead Implant Date: 20130926
Implantable Lead Location: 753859
Implantable Lead Location: 753860
Implantable Lead Model: 4456
Implantable Lead Model: 4476
Implantable Lead Serial Number: 473023
Implantable Lead Serial Number: 523746
Implantable Pulse Generator Implant Date: 20130926
Lead Channel Impedance Value: 378 Ohm
Lead Channel Impedance Value: 448 Ohm
Lead Channel Pacing Threshold Amplitude: 0.7 V
Lead Channel Pacing Threshold Pulse Width: 0.3 ms
Lead Channel Setting Pacing Amplitude: 2 V
Lead Channel Setting Pacing Amplitude: 2.5 V
Lead Channel Setting Pacing Pulse Width: 0.4 ms
Lead Channel Setting Sensing Sensitivity: 2.5 mV
Pulse Gen Serial Number: 138586

## 2020-04-13 ENCOUNTER — Ambulatory Visit (INDEPENDENT_AMBULATORY_CARE_PROVIDER_SITE_OTHER): Payer: Medicare PPO | Admitting: *Deleted

## 2020-04-13 DIAGNOSIS — I5032 Chronic diastolic (congestive) heart failure: Secondary | ICD-10-CM

## 2020-04-17 ENCOUNTER — Encounter: Payer: Self-pay | Admitting: Physical Medicine and Rehabilitation

## 2020-04-17 NOTE — Progress Notes (Signed)
Jacob Warner - 86 y.o. male MRN [id number]  Date of birth: 1924-07-18  Office Visit Note: Visit Date: 02/15/2020 PCP: [name], MD Referred by: [name], MD  Subjective: Chief Complaint  Patient presents with  . Lower Back - Pain   HPI: Jacob Warner is a 86 y.o. male who comes in today Evaluation and management of pain over the left lower back and left flank.  He has this chronic issue where he feels pain around the spinal cord stimulator battery site.  This is been checked on numerous occasions by myself and Dr. [name] with no real issues noted around the IPG.  He reports that he had a recent fall and he fell flat on his back on concrete.  He did not hit his head there was no loss of consciousness either after the fall or before.  He said he slipped.  He is having this left buttock pain worse than right.  He reports that spinal cord stimulator does not seem to have paresthesias since the fall.  He has not contacted [employer] as of yet.  He denies any radicular complaints down the legs.  He denies any focal weakness.  He has not had x-rays or evaluation.  We did obtain x-rays of the pelvis and lumbar spine today and those are reviewed below.  Just of point of note there was really no new issues noted.  No fractures etc.  Patient is able to bear weight but he does have pain across his lower back and buttock region.  He rates his pain as an 8 out of 10.  This is a chronic condition with severe exacerbation status post fall.  He denies any hip or groin pain.  Review of Systems  Musculoskeletal: Positive for back pain, falls and joint pain.  Neurological: Negative for focal weakness and weakness.  All other systems reviewed and are negative.  Otherwise per HPI.  Assessment & Plan: Visit Diagnoses:  1. Chronic bilateral low back pain without sciatica   2. Spondylosis without myelopathy or radiculopathy, lumbar region   3. S/P insertion of spinal cord stimulator   4.  Fall, initial encounter     Plan: Findings:  Chronic history of lumbar spine and low back pain with history of multifactorial spondylosis and scoliosis of the lumbar spine status post multiple procedures with some relief and then finally spinal cord stimulator which has been helpful for him.  It does not seem to be functioning to his satisfaction after the fall and is really hard for me to know at this point how it is working.  I am going to set him up with an appointment with [facility] and see if he can contact [employer].  X-rays were really unrevealing other than just multiple degenerative changes throughout the lumbar pelvis.  He has no hip or groin pain no sign of fracture.  We have talked about over-the-counter medication use is that he has as well as some gentle stretching and using heat and ice.  At this point we are just going to watch how he is doing as I think he will recover from this it may just take a while.  If he is not getting much better particularly after the spinal cord stimulator trial is evaluated and programmed and we could look at potential for injection.  He probably would do well regrouping with physical therapy.  He has had physical therapy on a few occasions at [facility] physical therapy  on [address].    Meds & Orders: No orders of the defined types were placed in this encounter.   Orders Placed This Encounter  Procedures  . XR Lumbar Spine 2-3 Views  . XR Pelvis 1-2 Views    Follow-up: Return if symptoms worsen or fail to improve.   Procedures: No procedures performed  No notes on file   Clinical History: CT PELVIS WITHOUT CONTRAST  TECHNIQUE: Multidetector CT imaging of the pelvis was performed following the standard protocol without intravenous contrast.  COMPARISON:  CT of the abdomen and pelvis on 06/26/2016, plain films 07/25/2018  FINDINGS: Urinary Tract: Normal appearance of the urinary bladder and distal ureters.  Bowel:  Nonobstructed bowel-gas pattern. There are numerous radiopaque tablets within the cecum and descending colon. I count approximately 25 tablets in the region of the cecum. These are primarily uniform and size, measuring approximately 1 centimeter in diameter. Findings are consistent with stasis but also raise the possibility of numerous tablets ingested at a single time.  Vascular/Lymphatic: There is dense atherosclerotic calcification of the abdominal aorta and femoral arteries. No aneurysm. No retroperitoneal or mesenteric adenopathy.  Reproductive:  Prostatic calcifications are present.  Other: There is partially imaged soft tissue in the region of the superior mesenteric artery and vein, similar in appearance to the previous exams.  Musculoskeletal: Remote fractures of the superior and inferior pubic rami bilaterally. Remote LEFT sacral fracture. No evidence for acute fracture or subluxation. Nondisplaced fractures cannot be excluded. Small amount of stranding in the subcutaneous region of the LEFT buttock.  IMPRESSION: 1. No acute, displaced fracture. Depending on the clinical suspicion, MRI may be helpful in detecting an nondisplaced fracture. 2. Remote fractures of the LEFT sacrum, superior and inferior pubic rami bilaterally. 3. Numerous radiopaque tablets in the proximal colon. Although the findings could be related to stasis, overdose needs to be considered. 4. Persistent soft tissue in the region the superior mesenteric artery and vein. The appearance is nonspecific and stable. 5. These results were called by telephone at the time of interpretation on 07/29/2018 at 11:37 am to Dr. Basil Dess , who verbally acknowledged these results.   Electronically Signed   By: Nolon Nations M.D.   On: 07/29/2018 11:37   He reports that he has never smoked. He has never used smokeless tobacco. No results for input(s): HGBA1C, LABURIC in the last 8760 hours.  Objective:   VS:  HT:    WT:   BMI:     BP:111/63  HR:74bpm  TEMP: ( )  RESP:  Physical Exam Vitals and nursing note reviewed.  Constitutional:      General: He is not in acute distress.    Appearance: He is well-developed. He is obese.  HENT:     Head: Normocephalic and atraumatic.     Nose: Nose normal.     Mouth/Throat:     Mouth: Mucous membranes are moist.     Pharynx: Oropharynx is clear.  Eyes:     Conjunctiva/sclera: Conjunctivae normal.     Pupils: Pupils are equal, round, and reactive to light.  Neck:     Trachea: No tracheal deviation.  Cardiovascular:     Rate and Rhythm: Normal rate and regular rhythm.     Pulses: Normal pulses.  Pulmonary:     Effort: Pulmonary effort is normal.     Breath sounds: Normal breath sounds.  Abdominal:     General: There is no distension.     Palpations: Abdomen is soft.  Tenderness: There is no guarding or rebound.  Musculoskeletal:        General: No deformity.     Cervical back: Normal range of motion and neck supple.     Right lower leg: No edema.     Left lower leg: No edema.     Comments: Patient ambulates slowly with a cane.  He has trouble going from sit to stand in full extension.  He does ambulate with a forward flexed lumbar spine.  No real bruising noted over the lower back.  He does have some focal tenderness in the paraspinal musculature quadratus lumborum and over the gluteus medius on the left more than right.  He has good movement of the hips bilaterally without pain.  No pain over the greater trochanters she has good distal strength no clonus.  Skin:    General: Skin is warm and dry.     Findings: No erythema or rash.  Neurological:     General: No focal deficit present.     Mental Status: He is alert and oriented to person, place, and time.     Motor: No abnormal muscle tone.     Coordination: Coordination normal.     Gait: Gait normal.  Psychiatric:        Mood and Affect: Mood normal.        Behavior: Behavior  normal.        Thought Content: Thought content normal.     Ortho Exam Imaging: AP of the pelvis:AP of the pelvis shows equal pelvic height right-sided moderate hip arthritis left-sided mild hip arthritis no fractures or dislocations.  Left-sided sacroiliac joint arthritic change compared to right and collapse of the right L4-5 facet joint with likely stenosis.  Spinal cord stimulator IPG in place with intact wires.  AP and lateral lumbar spine: There is thoracolumbar scoliosis with foraminal and facet joint collapse at L4-5 on the right with compensatory curvature at T12.  There is no listhesis noted there is multilevel facet arthritis.  Spinal cord stimulator IPG is in place with intact wires. Past Medical/Family/Surgical/Social History: Medications & Allergies reviewed per EMR, new medications updated. Patient Active Problem List   Diagnosis Date Noted  . S/P insertion of spinal cord stimulator 02/15/2020  . Spondylosis without myelopathy or radiculopathy, lumbar region 02/15/2020  . Dizzy 09/14/2019  . Chronic diastolic heart failure ([place]) 08/03/2019  . Cough 11/10/2018  . PAF (paroxysmal atrial fibrillation) ([place]) 11/02/2018  . De Quervain's tenosynovitis, right 02/09/2018  . Wrist pain, acute, right 02/09/2018  . Post laminectomy syndrome 03/19/2017  . Chronic pain syndrome 03/19/2017  . Chronic bilateral low back pain without sciatica 03/19/2017  . After cataract of both eyes not obscuring vision 03/03/2016  . 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 Primary open-angle glaucoma, right eye, indeterminate stage 03/03/2016  . Squamous blepharitis of upper and lower eyelids of both eyes 03/03/2016  . Bradycardia 05/31/2015  . Pacemaker 05/31/2015  . Peripheral edema 05/31/2015  . Heart murmur 05/31/2015  . Sick sinus syndrome ([place]) 05/31/2015  . Nonsustained ventricular tachycardia ([place]) 05/31/2015  . 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 A-fib ([place])   . Alopecia   . Anemia   . Bradycardia   . Cancer ([place])   . Cherry angioma   . Echocardiogram    Echo 11/19: Mild concentric LVH, EF 50-55, normal wall motion, grade 1 diastolic dysfunction, mild AI, MAC, mild MR,  normal RVSF, moderate TR, PASP 36  . GERD (gastroesophageal reflux disease)   . Glaucoma   . Hypertension   . Nuclear stress test    Nuclear stress test 11/19: not gated, inf and apical defect c/w small scar and/or soft tissue attenuation  . Pelvic fracture ([place])   . Right BBB/left ant fasc block   . Sick sinus syndrome ([place])   . TIA (transient ischemic attack)   . Xeroderma    Family History  Problem Relation Age of Onset  . Brain cancer Sister    Past Surgical History:  Procedure Laterality Date  . APPENDECTOMY    . CERVICAL SPINE SURGERY    . EXPLORATORY LAPAROTOMY    . LITHOTRIPSY    . PACEMAKER PLACEMENT    . SPINAL CORD STIMULATOR IMPLANT Bilateral 2018   [name], MD PhD, [facility]   Social History   Occupational History  . Not on file  Tobacco Use  . Smoking status: Never Smoker  . Smokeless tobacco: Never Used  Substance and Sexual Activity  . Alcohol use: No    Alcohol/week: 0.0 standard drinks  . Drug use: No  . Sexual activity: Not on file

## 2020-04-20 ENCOUNTER — Encounter: Payer: Self-pay | Admitting: Internal Medicine

## 2020-04-20 ENCOUNTER — Ambulatory Visit: Payer: Medicare PPO | Admitting: Internal Medicine

## 2020-04-20 ENCOUNTER — Other Ambulatory Visit: Payer: Self-pay

## 2020-04-20 ENCOUNTER — Telehealth: Payer: Self-pay | Admitting: Internal Medicine

## 2020-04-20 VITALS — BP 116/60 | HR 75 | Ht 70.0 in | Wt 197.0 lb

## 2020-04-20 DIAGNOSIS — I495 Sick sinus syndrome: Secondary | ICD-10-CM | POA: Diagnosis not present

## 2020-04-20 DIAGNOSIS — I5032 Chronic diastolic (congestive) heart failure: Secondary | ICD-10-CM

## 2020-04-20 DIAGNOSIS — Z950 Presence of cardiac pacemaker: Secondary | ICD-10-CM | POA: Diagnosis not present

## 2020-04-20 LAB — CBC WITH DIFFERENTIAL/PLATELET
Basophils Absolute: 0 10*3/uL (ref 0.0–0.2)
Basos: 0 %
EOS (ABSOLUTE): 0.2 10*3/uL (ref 0.0–0.4)
Eos: 3 %
Hematocrit: 34.2 % — ABNORMAL LOW (ref 37.5–51.0)
Hemoglobin: 11.4 g/dL — ABNORMAL LOW (ref 13.0–17.7)
Lymphocytes Absolute: 1.3 10*3/uL (ref 0.7–3.1)
Lymphs: 23 %
MCH: 32.4 pg (ref 26.6–33.0)
MCHC: 33.3 g/dL (ref 31.5–35.7)
MCV: 97 fL (ref 79–97)
Monocytes Absolute: 0.5 10*3/uL (ref 0.1–0.9)
Monocytes: 10 %
Neutrophils Absolute: 3.5 10*3/uL (ref 1.4–7.0)
Neutrophils: 64 %
Platelets: 221 10*3/uL (ref 150–450)
RBC: 3.52 x10E6/uL — ABNORMAL LOW (ref 4.14–5.80)
RDW: 13.2 % (ref 11.6–15.4)
WBC: 5.5 10*3/uL (ref 3.4–10.8)

## 2020-04-20 LAB — BASIC METABOLIC PANEL
BUN/Creatinine Ratio: 18 (ref 10–24)
BUN: 27 mg/dL (ref 10–36)
CO2: 30 mmol/L — ABNORMAL HIGH (ref 20–29)
Calcium: 9.4 mg/dL (ref 8.6–10.2)
Chloride: 105 mmol/L (ref 96–106)
Creatinine, Ser: 1.51 mg/dL — ABNORMAL HIGH (ref 0.76–1.27)
GFR calc Af Amer: 45 mL/min/{1.73_m2} — ABNORMAL LOW (ref >59)
GFR calc non Af Amer: 39 mL/min/{1.73_m2} — ABNORMAL LOW (ref >59)
Glucose: 82 mg/dL (ref 65–99)
Potassium: 4.5 mmol/L (ref 3.5–5.2)
Sodium: 139 mmol/L (ref 134–144)

## 2020-04-20 NOTE — Telephone Encounter (Signed)
   Pt's wife called, she would like to request if pt can bring their son Jacob Warner to his appt later. She said pt has a cane and doesn't hear well, and their son will be assisting him  Please advise

## 2020-04-20 NOTE — Telephone Encounter (Signed)
Returned call to Pt.  Advised ok for son to accompany to appt.  Added to appt notes.

## 2020-04-20 NOTE — Patient Instructions (Addendum)
Medication Instructions:  Your physician recommends that you continue on your current medications as directed. Please refer to the Current Medication list given to you today.  Labwork: You will get lab work today:  BMP and CBC  Testing/Procedures:  Your physician has requested that you have an echocardiogram. Echocardiography is a painless test that uses sound waves to create images of your heart. It provides your doctor with information about the size and shape of your heart and how well your heart's chambers and valves are working. This procedure takes approximately one hour. There are no restrictions for this procedure.  Please schedule for ECHO  Your physician has recommended that you have a pacemaker battery change. A pacemaker is a small device that is placed under the skin of your chest or abdomen to help control abnormal heart rhythms. This device uses electrical pulses to prompt the heart to beat at a normal rate. Pacemakers are used to treat heart rhythms that are too slow.   Follow-Up:  I will call you to schedule your generator change after your ECHO.  Any Other Special Instructions Will Be Listed Below (If Applicable).  If you need a refill on your cardiac medications before your next appointment, please call your pharmacy.    Pacemaker Battery Change  A pacemaker battery usually lasts 5-15 years (6-7 years on average). A few times a year, you will be asked to visit your health care provider to have a full evaluation of your pacemaker. When the battery is low, your pacemaker battery and generator will be completely replaced. Most often, this procedure is simpler than the first surgery because the wires (leads) that connect the generator to the heart are already in place. There are many things that affect how long a pacemaker battery will last, including:  The age of the pacemaker.  The number of leads you have(1, 2, or 3).  The pacemaker workload. If the pacemaker is  helping the heart more often, the battery will not last as long.  Power (voltage) settings.  What happens during the procedure?  To reduce your risk of infection: ? Your health care team will wash or sanitize their hands. ? The skin around the area of the chest will be washed with soap. ? Hair may be removed from the surgical area.  An IV tube will be inserted into one your veins to give you medicine and fluids.  You will be given one or more of the following: ? A medicine to help you relax (sedative). ? A medicine to numb the area where the pacemaker is located (local anesthetic).  You may be given antibiotic medicine to prevent infection.  Your health care provider will make an incision to reopen the pocket holding the pacemaker.  The old pacemaker will be disconnected from the leads.  The leads will be tested.  If needed, the leads will be replaced. If the leads are functioning properly, the new pacemaker will be connected to the existing leads.  A heart monitor and the pacemaker programmer will be used to make sure that the newly implanted pacemaker is working properly.  The incision site will be closed. A bandage (dressing) will be placed over the pacemaker site. The procedure may vary among health care providers and hospitals. Summary  A pacemaker battery usually lasts 5-15 years (6-7 years on average).  When the battery is low, your pacemaker battery and generator will be completely replaced.  Risks of this procedure include bleeding, bruising, infection, damage to other  structures, pulling apart of the skin at the incision site, and allergic reactions to medicines or anesthetics.  Most often, this procedure is simpler than the first surgery because the wires (leads) that connect the generator to the heart are already in place. This information is not intended to replace advice given to you by your health care provider. Make sure you discuss any questions you have with  your health care provider. Document Revised: 11/27/2017 Document Reviewed: 11/18/2016 Elsevier Patient Education  2020 [employer].

## 2020-04-21 NOTE — H&P (View-Only) (Signed)
HPI Jacob Warner returns today for followup. He is a pleasant 86 yo man with a h/o sinus node dysfunction, PAF who developed evidence of toxicity on amiodarone and this was stopped. His dizziness has improved. He has class 2 dyspnea. He has a PR of 300 ms and RBBB. He has not had recurrent atrial fib. He is approaching ERI. Allergies  Allergen Reactions  . Sulfa Antibiotics Hives and Rash    Bactrim  . Sulfacetamide Sodium Hives and Rash    Bactrim     Current Outpatient Medications  Medication Sig Dispense Refill  . acetaminophen (TYLENOL) 500 MG tablet Take 1,000 mg by mouth every 6 (six) hours as needed (pain).     Marland Kitchen aspirin 81 MG tablet Take 81 mg by mouth daily.    Marland Kitchen azelastine (ASTELIN) 0.1 % nasal spray     . Cyanocobalamin (VITAMIN B-12 PO) Take 1 tablet by mouth daily.    Marland Kitchen denosumab (PROLIA) 60 MG/ML SOSY injection Inject 60 mg into the skin every 6 (six) months.    . dutasteride (AVODART) 0.5 MG capsule Take 0.5 mg by mouth daily.    . fluocinolone (SYNALAR) 0.025 % ointment APPLY TO AFFECTED AREA TWICE A DAY    . furosemide (LASIX) 40 MG tablet Take 1 tablet by mouth as needed for fluid.    Marland Kitchen gabapentin (NEURONTIN) 300 MG capsule Take 300 mg by mouth every morning.     . hydroxypropyl methylcellulose / hypromellose (ISOPTO TEARS / GONIOVISC) 2.5 % ophthalmic solution Place 1 drop into both eyes as needed for dry eyes.    Marland Kitchen ketoconazole (NIZORAL) 2 % shampoo USE 2 3 TIMES WEEKLY LET SIT FOR 5 MINUTES THEN RINSE    . lactulose (CHRONULAC) 10 GM/15ML solution Take 10 g by mouth daily.   0  . latanoprost (XALATAN) 0.005 % ophthalmic solution PLACE 1 DROP INTO BOTH EYES AT BEDTIME  11  . Magnesium Oxide 400 (240 Mg) MG TABS Take 1 tablet by mouth 2 (two) times daily.  7  . pantoprazole (PROTONIX) 40 MG tablet Take 40 mg by mouth daily.    . RESTASIS 0.05 % ophthalmic emulsion PLACE 1 DROP INTO BOTH EYES 2 TIMES DAILY.  11  . traZODone (DESYREL) 50 MG tablet TAKE 1 TO 2  TABLETS AT BEDTIME AS NEEDED FOR INSOMNIA  11  . Vitamin D, Ergocalciferol, (DRISDOL) 50000 units CAPS capsule Take 1 capsule by mouth every Tuesday.     . White Petrolatum-Mineral Oil (EYE LUBRICANT) OINT Apply 1 strip to eye at bedtime.    . nitroGLYCERIN (NITROSTAT) 0.4 MG SL tablet Place 1 tablet (0.4 mg total) under the tongue every 5 (five) minutes as needed for chest pain. 25 tablet 5   Current Facility-Administered Medications  Medication Dose Route Frequency Provider Last Rate Last Admin  . methylPREDNISolone acetate (DEPO-MEDROL) injection 80 mg  80 mg Other Once Magnus Sinning, MD         Past Medical History:  Diagnosis Date  . A-fib ([place])   . Alopecia   . Anemia   . Bradycardia   . Cancer ([place])   . Cherry angioma   . Echocardiogram    Echo 11/19: Mild concentric LVH, EF 50-55, normal wall motion, grade 1 diastolic dysfunction, mild AI, MAC, mild MR, normal RVSF, moderate TR, PASP 36  . GERD (gastroesophageal reflux disease)   . Glaucoma   . Hypertension   . Nuclear stress test    Nuclear stress  test 11/19: not gated, inf and apical defect c/w small scar and/or soft tissue attenuation  . Pelvic fracture ([facility])   . Right BBB/left ant fasc block   . Sick sinus syndrome ([facility])   . TIA (transient ischemic attack)   . Xeroderma     ROS:   All systems reviewed and negative except as noted in the HPI.   Past Surgical History:  Procedure Laterality Date  . APPENDECTOMY    . CERVICAL SPINE SURGERY    . EXPLORATORY LAPAROTOMY    . LITHOTRIPSY    . PACEMAKER PLACEMENT    . SPINAL CORD STIMULATOR IMPLANT Bilateral 2018   Jacob Harkins, MD PhD, [facility]     Family History  Problem Relation Age of Onset  . Brain cancer Sister      Social History   Socioeconomic History  . Marital status: Married    Spouse name: Not on file  . Number of children: Not on file  . Years of education: Not on file  . Highest education level: Not  on file  Occupational History  . Not on file  Tobacco Use  . Smoking status: Never Smoker  . Smokeless tobacco: Never Used  Substance and Sexual Activity  . Alcohol use: No    Alcohol/week: 0.0 standard drinks  . Drug use: No  . Sexual activity: Not on file  Other Topics Concern  . Not on file  Social History Narrative  . Not on file   Social Determinants of Health   Financial Resource Strain:   . Difficulty of Paying Living Expenses:   Food Insecurity:   . Worried About Running Out of Food in the Last Year:   . Ran Out of Food in the Last Year:   Transportation Needs:   . Lack of Transportation (Medical):   . Lack of Transportation (Non-Medical):   Physical Activity:   . Days of Exercise per Week:   . Minutes of Exercise per Session:   Stress:   . Feeling of Stress :   Social Connections:   . Frequency of Communication with Friends and Family:   . Frequency of Social Gatherings with Friends and Family:   . Attends Religious Services:   . Active Member of Clubs or Organizations:   . Attends Club or Organization Meetings:   . Marital Status:   Intimate Partner Violence:   . Fear of Current or Ex-Partner:   . Emotionally Abused:   . Physically Abused:   . Sexually Abused:      BP 116/60   Pulse 75   Ht 5' 10" (1.778 m)   Wt 197 lb (89.4 kg)   SpO2 95%   BMI 28.27 kg/m   Physical Exam:  Well appearing NAD HEENT: Unremarkable Neck:  No JVD, no thyromegally Lymphatics:  No adenopathy Back:  No CVA tenderness Lungs:  Clear with no wheezes HEART:  Regular rate rhythm, no murmurs, no rubs, no clicks Abd:  soft, positive bowel sounds, no organomegally, no rebound, no guarding Ext:  2 plus pulses, no edema, no cyanosis, no clubbing Skin:  No rashes no nodules Neuro:  CN II through XII intact, motor grossly intact  EKG - nsr with marked first degree AV block  DEVICE  Normal device function.  See PaceArt for details. He is approaching ERI.    Assess/Plan: 1. PAF - he is maintaining NSR. He will undergo watchful waiting. I expect that he will eventually go back to atrial fib.   2. Chronic diastolic heart failure - his symptoms have worsened and my concern is that he has devloped LV dysfunction. I have asked him to repeat the 2D echo. If his EF has gone down, we might consider adding a LV lead or a left bundle area pacing lead at gen change. 3. Sick sinus syndrome - he is pacing almost 100% of the time.  4. PM - his [employer] DDD PM is working normally. He is less than a year from ERI.  Mikle Bosworth.D.

## 2020-04-21 NOTE — Progress Notes (Signed)
HPI [name] returns today for followup. He is a pleasant 86 yo man with a h/o sinus node dysfunction, PAF who developed evidence of toxicity on amiodarone and this was stopped. His dizziness has improved. He has class 2 dyspnea. He has a PR of 300 ms and RBBB. He has not had recurrent atrial fib. He is approaching ERI. Allergies  Allergen Reactions  . Sulfa Antibiotics Hives and Rash    Bactrim  . Sulfacetamide Sodium Hives and Rash    Bactrim     Current Outpatient Medications  Medication Sig Dispense Refill  . acetaminophen (TYLENOL) 500 MG tablet Take 1,000 mg by mouth every 6 (six) hours as needed (pain).     Marland Kitchen aspirin 81 MG tablet Take 81 mg by mouth daily.    Marland Kitchen azelastine (ASTELIN) 0.1 % nasal spray     . Cyanocobalamin (VITAMIN B-12 PO) Take 1 tablet by mouth daily.    Marland Kitchen denosumab (PROLIA) 60 MG/ML SOSY injection Inject 60 mg into the skin every 6 (six) months.    . dutasteride (AVODART) 0.5 MG capsule Take 0.5 mg by mouth daily.    . fluocinolone (SYNALAR) 0.025 % ointment APPLY TO AFFECTED AREA TWICE A DAY    . furosemide (LASIX) 40 MG tablet Take 1 tablet by mouth as needed for fluid.    Marland Kitchen gabapentin (NEURONTIN) 300 MG capsule Take 300 mg by mouth every morning.     . hydroxypropyl methylcellulose / hypromellose (ISOPTO TEARS / GONIOVISC) 2.5 % ophthalmic solution Place 1 drop into both eyes as needed for dry eyes.    Marland Kitchen ketoconazole (NIZORAL) 2 % shampoo USE 2 3 TIMES WEEKLY LET SIT FOR 5 MINUTES THEN RINSE    . lactulose (CHRONULAC) 10 GM/15ML solution Take 10 g by mouth daily.   0  . latanoprost (XALATAN) 0.005 % ophthalmic solution PLACE 1 DROP INTO BOTH EYES AT BEDTIME  11  . Magnesium Oxide 400 (240 Mg) MG TABS Take 1 tablet by mouth 2 (two) times daily.  7  . pantoprazole (PROTONIX) 40 MG tablet Take 40 mg by mouth daily.    . RESTASIS 0.05 % ophthalmic emulsion PLACE 1 DROP INTO BOTH EYES 2 TIMES DAILY.  11  . traZODone (DESYREL) 50 MG tablet TAKE 1 TO 2  TABLETS AT BEDTIME AS NEEDED FOR INSOMNIA  11  . Vitamin D, Ergocalciferol, (DRISDOL) 50000 units CAPS capsule Take 1 capsule by mouth every Tuesday.     . White Petrolatum-Mineral Oil (EYE LUBRICANT) OINT Apply 1 strip to eye at bedtime.    . nitroGLYCERIN (NITROSTAT) 0.4 MG SL tablet Place 1 tablet (0.4 mg total) under the tongue every 5 (five) minutes as needed for chest pain. 25 tablet 5   Current Facility-Administered Medications  Medication Dose Route Frequency Provider Last Rate Last Admin  . methylPREDNISolone acetate (DEPO-MEDROL) injection 80 mg  80 mg Other Once Magnus Sinning, MD         Past Medical History:  Diagnosis Date  . A-fib ([place])   . Alopecia   . Anemia   . Bradycardia   . Cancer ([place])   . Cherry angioma   . Echocardiogram    Echo 11/19: Mild concentric LVH, EF 50-55, normal wall motion, grade 1 diastolic dysfunction, mild AI, MAC, mild MR, normal RVSF, moderate TR, PASP 36  . GERD (gastroesophageal reflux disease)   . Glaucoma   . Hypertension   . Nuclear stress test    Nuclear stress  test 11/19: not gated, inf and apical defect c/w small scar and/or soft tissue attenuation  . Pelvic fracture ([place])   . Right BBB/left ant fasc block   . Sick sinus syndrome ([place])   . TIA (transient ischemic attack)   . Xeroderma     ROS:   All systems reviewed and negative except as noted in the HPI.   Past Surgical History:  Procedure Laterality Date  . APPENDECTOMY    . CERVICAL SPINE SURGERY    . EXPLORATORY LAPAROTOMY    . LITHOTRIPSY    . PACEMAKER PLACEMENT    . SPINAL CORD STIMULATOR IMPLANT Bilateral 2018   [name], MD PhD, [facility]     Family History  Problem Relation Age of Onset  . Brain cancer Sister      Social History   Socioeconomic History  . Marital status: Married    Spouse name: Not on file  . Number of children: Not on file  . Years of education: Not on file  . Highest education level: Not  on file  Occupational History  . Not on file  Tobacco Use  . Smoking status: Never Smoker  . Smokeless tobacco: Never Used  Substance and Sexual Activity  . Alcohol use: No    Alcohol/week: 0.0 standard drinks  . Drug use: No  . Sexual activity: Not on file  Other Topics Concern  . Not on file  Social History Narrative  . Not on file   Social Determinants of Health   Financial Resource Strain:   . Difficulty of Paying Living Expenses:   Food Insecurity:   . Worried About Charity fundraiser in the Last Year:   . Arboriculturist in the Last Year:   Transportation Needs:   . Film/video editor (Medical):   Marland Kitchen Lack of Transportation (Non-Medical):   Physical Activity:   . Days of Exercise per Week:   . Minutes of Exercise per Session:   Stress:   . Feeling of Stress :   Social Connections:   . Frequency of Communication with Friends and Family:   . Frequency of Social Gatherings with Friends and Family:   . Attends Religious Services:   . Active Member of Clubs or Organizations:   . Attends Archivist Meetings:   Marland Kitchen Marital Status:   Intimate Partner Violence:   . Fear of Current or Ex-Partner:   . Emotionally Abused:   Marland Kitchen Physically Abused:   . Sexually Abused:      BP 116/60   Pulse 75   Ht _0  (1.778 m)   Wt 197 lb (89.4 kg)   SpO2 95%   BMI 28.27 kg/m   Physical Exam:  Well appearing NAD HEENT: Unremarkable Neck:  No JVD, no thyromegally Lymphatics:  No adenopathy Back:  No CVA tenderness Lungs:  Clear with no wheezes HEART:  Regular rate rhythm, no murmurs, no rubs, no clicks Abd:  soft, positive bowel sounds, no organomegally, no rebound, no guarding Ext:  2 plus pulses, no edema, no cyanosis, no clubbing Skin:  No rashes no nodules Neuro:  CN II through XII intact, motor grossly intact  EKG - nsr with marked first degree AV block  DEVICE  Normal device function.  See PaceArt for details. He is approaching ERI.    Assess/Plan: 1. PAF - he is maintaining NSR. He will undergo watchful waiting. I expect that he will eventually go back to atrial fib.  2. Chronic diastolic heart failure - his symptoms have worsened and my concern is that he has devloped LV dysfunction. I have asked him to repeat the 2D echo. If his EF has gone down, we might consider adding a LV lead or a left bundle area pacing lead at gen change. 3. Sick sinus syndrome - he is pacing almost 100% of the time.  4. PM - his [employer] DDD PM is working normally. He is less than a year from ERI.  Mikle Bosworth.D.

## 2020-04-25 ENCOUNTER — Other Ambulatory Visit: Payer: Self-pay

## 2020-04-25 ENCOUNTER — Ambulatory Visit (HOSPITAL_COMMUNITY): Payer: Medicare PPO | Attending: Cardiovascular Disease

## 2020-04-25 DIAGNOSIS — I5032 Chronic diastolic (congestive) heart failure: Secondary | ICD-10-CM

## 2020-04-27 ENCOUNTER — Telehealth: Payer: Self-pay

## 2020-04-27 NOTE — Telephone Encounter (Signed)
-----   Message from [name], MD sent at 04/25/2020  8:52 PM EDT ----- Heart pumping function is normal. He will not benefit from having an extra pacing lead at the time of his gen change out.

## 2020-04-30 DIAGNOSIS — H903 Sensorineural hearing loss, bilateral: Secondary | ICD-10-CM | POA: Diagnosis not present

## 2020-04-30 DIAGNOSIS — H6123 Impacted cerumen, bilateral: Secondary | ICD-10-CM | POA: Diagnosis not present

## 2020-04-30 NOTE — Telephone Encounter (Signed)
Call placed to Pt.  Advised per Dr. Jarold Motto will not require an upgrade to a biv device based on echo results.  Pt scheduled for gen change on May 10, 2020.  Will get covid test May 11.  Work up complete

## 2020-05-04 ENCOUNTER — Telehealth: Payer: Self-pay

## 2020-05-04 NOTE — Telephone Encounter (Signed)
Called patient to inform him of a change in the time of his appointment on May 13, he will need to arrive at 11:30 instead of 9:30. Pt verbalized understanding and thanked me for calling.

## 2020-05-08 ENCOUNTER — Other Ambulatory Visit (HOSPITAL_COMMUNITY)
Admission: RE | Admit: 2020-05-08 | Discharge: 2020-05-08 | Disposition: A | Discharge status: Home / Self Care | Payer: Medicare PPO | Source: Ambulatory Visit

## 2020-05-08 ENCOUNTER — Ambulatory Visit: Payer: Medicare PPO | Admitting: Cardiology

## 2020-05-08 ENCOUNTER — Other Ambulatory Visit: Payer: Self-pay

## 2020-05-08 ENCOUNTER — Encounter: Payer: Self-pay | Admitting: Cardiology

## 2020-05-08 VITALS — BP 120/66 | HR 74 | Ht 70.0 in | Wt 194.0 lb

## 2020-05-08 DIAGNOSIS — Z882 Allergy status to sulfonamides status: Secondary | ICD-10-CM | POA: Diagnosis not present

## 2020-05-08 DIAGNOSIS — I110 Hypertensive heart disease with heart failure: Secondary | ICD-10-CM | POA: Insufficient documentation

## 2020-05-08 DIAGNOSIS — I5032 Chronic diastolic (congestive) heart failure: Secondary | ICD-10-CM | POA: Diagnosis not present

## 2020-05-08 DIAGNOSIS — I480 Paroxysmal atrial fibrillation: Secondary | ICD-10-CM | POA: Insufficient documentation

## 2020-05-08 DIAGNOSIS — Z881 Allergy status to other antibiotic agents status: Secondary | ICD-10-CM | POA: Insufficient documentation

## 2020-05-08 DIAGNOSIS — Z950 Presence of cardiac pacemaker: Secondary | ICD-10-CM

## 2020-05-08 DIAGNOSIS — I495 Sick sinus syndrome: Secondary | ICD-10-CM

## 2020-05-08 DIAGNOSIS — Z45010 Encounter for checking and testing of cardiac pacemaker pulse generator [battery]: Secondary | ICD-10-CM | POA: Diagnosis not present

## 2020-05-08 DIAGNOSIS — K219 Gastro-esophageal reflux disease without esophagitis: Secondary | ICD-10-CM | POA: Diagnosis not present

## 2020-05-08 DIAGNOSIS — Z20822 Contact with and (suspected) exposure to covid-19: Secondary | ICD-10-CM | POA: Diagnosis not present

## 2020-05-08 DIAGNOSIS — Z8673 Personal history of transient ischemic attack (TIA), and cerebral infarction without residual deficits: Secondary | ICD-10-CM | POA: Diagnosis not present

## 2020-05-08 DIAGNOSIS — Z7982 Long term (current) use of aspirin: Secondary | ICD-10-CM | POA: Diagnosis not present

## 2020-05-08 DIAGNOSIS — T462X1A Poisoning by other antidysrhythmic drugs, accidental (unintentional), initial encounter: Secondary | ICD-10-CM | POA: Diagnosis not present

## 2020-05-08 DIAGNOSIS — Z79899 Other long term (current) drug therapy: Secondary | ICD-10-CM | POA: Diagnosis not present

## 2020-05-08 LAB — SARS CORONAVIRUS 2 (TAT 6-24 HRS): SARS Coronavirus 2: NEGATIVE

## 2020-05-08 NOTE — Progress Notes (Signed)
Cardiology Office Note:    Date:  05/08/2020   ID:  Jacob Warner, DOB July 23, 1924, MRN [id number]  PCP:  Jacob Small, MD  Cardiologist:  Jacob Dawley, MD  Electrophysiologist:  Jacob Peru, MD   Referring MD: Jacob Small, MD   Reason for Visit/CC: 2 months follow up for acute on chronic diastolic CHF   History of Present Illness:    Jacob Warner is a 86 y.o. male with a hx of paroxysmal atrial fibrillation, SSS, status post pacemaker implantation, followed by Jacob Warner. The patient has a past history of having had nodular lymphoma in 2008. This was treated successfully with chemotherapy. He has been in remission. His oncologist here in [place] is Jacob Warner. Also no history of HTN or DM. There has also been concern for PAF, thus he has been treated with low dose Amiodarone. He is on ASA only. He has a h/o falls thus not on a/c.  Most recent interrogation of his pacemaker on April 20, 2020 at Jacob Warner office showed 100% pacing and no episodes of atrial fibrillation. He presented in February 2021 with progressively worsening shortness of breath, he obtain his labs with creatinine 1.64, and BNP of 377 increased from previous 271.  We have increased Lasix from 20 to 40 mg daily with improvement of symptoms, and decrease in weight.  The patient states that he has been doing well, his shortness of breath has improved, he currently struggles with neuropathic pain in his left foot that has been chronic for years, also he takes care of his wife who is on home hospice.  He denies any chest pain, no palpitation dizziness or falls.  Orthopnea or proximal nocturnal dyspnea.  Minimal chronic lower extremity edema.  Past Medical History:  Diagnosis Date  . A-fib ([place])   . Alopecia   . Anemia   . Bradycardia   . Cancer ([place])   . Cherry angioma   . Echocardiogram    Echo 11/19: Mild concentric LVH, EF 50-55, normal wall motion, grade 1 diastolic dysfunction, mild AI, MAC, mild  MR, normal RVSF, moderate TR, PASP 36  . GERD (gastroesophageal reflux disease)   . Glaucoma   . Hypertension   . Nuclear stress test    Nuclear stress test 11/19: not gated, inf and apical defect c/w Warner scar and/or soft tissue attenuation  . Pelvic fracture ([place])   . Right BBB/left ant fasc block   . Sick sinus syndrome ([place])   . TIA (transient ischemic attack)   . Xeroderma     Past Surgical History:  Procedure Laterality Date  . APPENDECTOMY    . CERVICAL SPINE SURGERY    . EXPLORATORY LAPAROTOMY    . LITHOTRIPSY    . PACEMAKER PLACEMENT    . SPINAL CORD STIMULATOR IMPLANT Bilateral 2018   Jacob Hakim, MD PhD, [facility]    Current Medications: Current Meds  Medication Sig  . acetaminophen (TYLENOL) 500 MG tablet Take 500-1,000 mg by mouth See admin instructions. 500 mg twice daily, 1000 mg at bedtime  . aspirin 81 MG tablet Take 81 mg by mouth daily.  Marland Kitchen azelastine (ASTELIN) 0.1 % nasal spray Place 1 spray into both nostrils 2 (two) times daily as needed for rhinitis.   Marland Kitchen denosumab (PROLIA) 60 MG/ML SOSY injection Inject 60 mg into the skin every 6 (six) months.  . dutasteride (AVODART) 0.5 MG capsule Take 0.5 mg by mouth daily.  . furosemide (LASIX) 40 MG tablet  Take 40 mg by mouth daily.   Marland Kitchen gabapentin (NEURONTIN) 300 MG capsule Take 300 mg by mouth at bedtime.   . hydroxypropyl methylcellulose / hypromellose (ISOPTO TEARS / GONIOVISC) 2.5 % ophthalmic solution Place 1 drop into both eyes as needed for dry eyes.  Marland Kitchen ketoconazole (NIZORAL) 2 % shampoo Apply 1 application topically 2 (two) times a week.   . lactulose (CHRONULAC) 10 GM/15ML solution Take 10 g by mouth daily.   Marland Kitchen latanoprost (XALATAN) 0.005 % ophthalmic solution Place 1 drop into both eyes at bedtime.   . Magnesium Oxide 400 (240 Mg) MG TABS Take 1 tablet by mouth 2 (two) times daily.  . meloxicam (MOBIC) 15 MG tablet Take 15 mg by mouth daily.  . RESTASIS 0.05 % ophthalmic  emulsion Place 1 drop into both eyes daily.   . traZODone (DESYREL) 50 MG tablet Take 50 mg by mouth at bedtime.   . vitamin B-12 (CYANOCOBALAMIN) 1000 MCG tablet Take 1,000 mcg by mouth daily.   . White Petrolatum-Mineral Oil (EYE LUBRICANT) OINT Apply 1 strip to eye at bedtime.   Current Facility-Administered Medications for the 05/08/20 encounter (Office Visit) with [name], MD  Medication  . methylPREDNISolone acetate (DEPO-MEDROL) injection 80 mg     Allergies:   Sulfa antibiotics and Sulfacetamide sodium   Social History   Socioeconomic History  . Marital status: Married    Spouse name: Not on file  . Number of children: Not on file  . Years of education: Not on file  . Highest education level: Not on file  Occupational History  . Not on file  Tobacco Use  . Smoking status: Never Smoker  . Smokeless tobacco: Never Used  Substance and Sexual Activity  . Alcohol use: No    Alcohol/week: 0.0 standard drinks  . Drug use: No  . Sexual activity: Not on file  Other Topics Concern  . Not on file  Social History Narrative  . Not on file   Social Determinants of Health   Financial Resource Strain:   . Difficulty of Paying Living Expenses:   Food Insecurity:   . Worried About Charity fundraiser in the Last Year:   . Arboriculturist in the Last Year:   Transportation Needs:   . Film/video editor (Medical):   Marland Kitchen Lack of Transportation (Non-Medical):   Physical Activity:   . Days of Exercise per Week:   . Minutes of Exercise per Session:   Stress:   . Feeling of Stress :   Social Connections:   . Frequency of Communication with Friends and Family:   . Frequency of Social Gatherings with Friends and Family:   . Attends Religious Services:   . Active Member of Clubs or Organizations:   . Attends Archivist Meetings:   Marland Kitchen Marital Status:      Family History: The patient's family history includes Brain cancer in his sister.  ROS:   Please see  the history of present illness.    All other systems reviewed and are negative.  EKGs/Labs/Other Studies Reviewed:    The following studies were reviewed today:  EKG:  EKG is ordered today.  The ekg ordered today demonstrates atrial paced rhythm  Recent Labs: 02/08/2020: ALT 14; NT-Pro BNP 377; TSH 3.050 04/20/2020: BUN 27; Creatinine, Ser 1.51; Hemoglobin 11.4; Platelets 221; Potassium 4.5; Sodium 139  Recent Lipid Panel No results found for: CHOL, TRIG, HDL, CHOLHDL, VLDL, LDLCALC, LDLDIRECT  Physical Exam:  VS:  BP 120/66   Pulse 74   Ht _0  (1.778 m)   Wt 194 lb (88 kg)   SpO2 94%   BMI 27.84 kg/m     Wt Readings from Last 3 Encounters:  05/08/20 194 lb (88 kg)  04/20/20 197 lb (89.4 kg)  02/08/20 214 lb 6.4 oz (97.3 kg)     GEN:  Well nourished, well developed in no acute distress HEENT: Normal NECK: No JVD; No carotid bruits LYMPHATICS: No lymphadenopathy CARDIAC: RRR, no murmurs, rubs, gallops RESPIRATORY:  Clear to auscultation without rales, wheezing or rhonchi  ABDOMEN: Soft, non-tender, non-distended MUSCULOSKELETAL:  No edema; No deformity  SKIN: Warm and dry NEUROLOGIC:  Alert and oriented x 3 PSYCHIATRIC:  Normal affect   ASSESSMENT:    1. PAF (paroxysmal atrial fibrillation) ([place])   2. Chronic diastolic heart failure ([place])   3. Sick sinus syndrome ([place])   4. Amiodarone toxicity, accidental or unintentional, initial encounter   5. Pacemaker    PLAN:    In order of problems listed above:  Acute on chronic diastolic CHF -increase Lasix to 40 mg daily at the last visit with improvement of symptoms, his creatinine improved from 1.64-1.51, will continue the same management follow-up in 4 months.  SSS: s/p [employer] PPM implant in 2013 at [facility]. Now followed by Jacob Warner, also seen on 04/20/2020.  Functioning well. He is less than a year from ERI.  He is scheduled for replacement on 513 by Jacob Warner.  He is pacing almost 100% of the time. No  atrial fibrillation episodes.   PAF: maintaining NSR.  He is now off amiodarone, TSH 3.0 in 01/2020. He is pacing almost 100% of the time. No atrial fibrillation episodes.   Dizziness - we discontinued Cardura at the last visit in 01/2020, his symptoms have improved, his blood pressure remains normal.1  Medication Adjustments/Labs and Tests Ordered: Current medicines are reviewed at length with the patient today.  Concerns regarding medicines are outlined above.  Orders Placed This Encounter  Procedures  . EKG 12-Lead   No orders of the defined types were placed in this encounter.   Patient Instructions  Medication Instructions:   Your physician recommends that you continue on your current medications as directed. Please refer to the Current Medication list given to you today.  *If you need a refill on your cardiac medications before your next appointment, please call your pharmacy*    Follow-Up:  WITH DR. Meda Coffee IN PERSON ON August 23, 2020 AT 11:00 AM--THIS WILL BE IN THE OFFICE     Signed, Jacob Dawley, MD  05/08/2020 9:53 AM    [place]

## 2020-05-08 NOTE — Patient Instructions (Signed)
Medication Instructions:   Your physician recommends that you continue on your current medications as directed. Please refer to the Current Medication list given to you today.  *If you need a refill on your cardiac medications before your next appointment, please call your pharmacy*    Follow-Up:  WITH DR. Meda Coffee IN PERSON ON August 23, 2020 AT 11:00 AM--THIS WILL BE IN THE OFFICE

## 2020-05-09 ENCOUNTER — Telehealth: Payer: Self-pay

## 2020-05-09 NOTE — Telephone Encounter (Signed)
I spoke to the patient's son and informed him to let the patient know that he needs to arrive to the hospital at 9:30 for his 11:30 appointment.  He verbalized understanding.

## 2020-05-10 ENCOUNTER — Ambulatory Visit (HOSPITAL_COMMUNITY)
Admission: RE | Admit: 2020-05-10 | Discharge: 2020-05-10 | Disposition: A | Discharge status: Home / Self Care | Payer: Medicare PPO | Attending: Internal Medicine | Admitting: Internal Medicine

## 2020-05-10 ENCOUNTER — Encounter (HOSPITAL_COMMUNITY)
Admission: RE | Disposition: A | Discharge status: Home / Self Care | Payer: Self-pay | Source: Home / Self Care | Attending: Internal Medicine

## 2020-05-10 ENCOUNTER — Other Ambulatory Visit: Payer: Self-pay

## 2020-05-10 DIAGNOSIS — I110 Hypertensive heart disease with heart failure: Secondary | ICD-10-CM | POA: Diagnosis not present

## 2020-05-10 DIAGNOSIS — Z881 Allergy status to other antibiotic agents status: Secondary | ICD-10-CM | POA: Diagnosis not present

## 2020-05-10 DIAGNOSIS — I5032 Chronic diastolic (congestive) heart failure: Secondary | ICD-10-CM | POA: Diagnosis not present

## 2020-05-10 DIAGNOSIS — Z45010 Encounter for checking and testing of cardiac pacemaker pulse generator [battery]: Secondary | ICD-10-CM | POA: Diagnosis not present

## 2020-05-10 DIAGNOSIS — I495 Sick sinus syndrome: Secondary | ICD-10-CM | POA: Diagnosis not present

## 2020-05-10 DIAGNOSIS — Z7982 Long term (current) use of aspirin: Secondary | ICD-10-CM | POA: Diagnosis not present

## 2020-05-10 DIAGNOSIS — Z20822 Contact with and (suspected) exposure to covid-19: Secondary | ICD-10-CM | POA: Diagnosis not present

## 2020-05-10 DIAGNOSIS — Z882 Allergy status to sulfonamides status: Secondary | ICD-10-CM | POA: Diagnosis not present

## 2020-05-10 DIAGNOSIS — I480 Paroxysmal atrial fibrillation: Secondary | ICD-10-CM | POA: Diagnosis not present

## 2020-05-10 HISTORY — PX: PPM GENERATOR CHANGEOUT: EP1233

## 2020-05-10 SURGERY — PPM GENERATOR CHANGEOUT

## 2020-05-10 MED ORDER — LIDOCAINE HCL (PF) 1 % IJ SOLN
INTRAMUSCULAR | Status: DC | PRN
  Administered 2020-05-10: 60 mL

## 2020-05-10 MED ORDER — SODIUM CHLORIDE 0.9 % IV SOLN: INTRAVENOUS | Status: DC

## 2020-05-10 MED ORDER — SODIUM CHLORIDE 0.9 % IV SOLN
INTRAVENOUS | Status: AC
  Filled 2020-05-10: qty 2

## 2020-05-10 MED ORDER — CHLORHEXIDINE GLUCONATE 4 % EX LIQD
4.0000 "application " | Freq: Once | CUTANEOUS | Status: DC
  Filled 2020-05-10: qty 60

## 2020-05-10 MED ORDER — LIDOCAINE HCL 1 % IJ SOLN
INTRAMUSCULAR | Status: AC
  Filled 2020-05-10: qty 60

## 2020-05-10 MED ORDER — CEFAZOLIN SODIUM-DEXTROSE 2-4 GM/100ML-% IV SOLN
INTRAVENOUS | Status: AC
  Filled 2020-05-10: qty 100

## 2020-05-10 MED ORDER — ACETAMINOPHEN 325 MG PO TABS
325.0000 mg | ORAL_TABLET | ORAL | Status: DC | PRN
Start: 2020-05-10 — End: 2020-05-10

## 2020-05-10 MED ORDER — SODIUM CHLORIDE 0.9 % IV SOLN
80.0000 mg | INTRAVENOUS | Status: AC
  Administered 2020-05-10: 80 mg

## 2020-05-10 MED ORDER — ONDANSETRON HCL 4 MG/2ML IJ SOLN: 4.0000 mg | Freq: Four times a day (QID) | INTRAMUSCULAR | Status: DC | PRN

## 2020-05-10 MED ORDER — CEFAZOLIN SODIUM-DEXTROSE 2-4 GM/100ML-% IV SOLN
2.0000 g | INTRAVENOUS | Status: AC
  Administered 2020-05-10: 2 g via INTRAVENOUS

## 2020-05-10 SURGICAL SUPPLY — 4 items
CABLE SURGICAL S-101-97-12 (CABLE) ×2 IMPLANT
PACEMAKER ACCOLADE GR (Pacemaker) ×1 IMPLANT
PAD PRO RADIOLUCENT 2001M-C (PAD) ×2 IMPLANT
TRAY PACEMAKER INSERTION (PACKS) ×2 IMPLANT

## 2020-05-10 NOTE — Discharge Instructions (Signed)
Pacemaker Battery Change, Care After This sheet gives you information about how to care for yourself after your procedure. Your health care provider may also give you more specific instructions. If you have problems or questions, contact your health care provider. What can I expect after the procedure? After your procedure, it is common to have:  Pain or soreness at the site where the pacemaker was inserted.  Swelling at the site where the pacemaker was inserted. Follow these instructions at home: Incision care   Keep the incision clean and dry. ? Do not take baths, swim, or use a hot tub until your health care provider approves. ? You may shower the day after your procedure, or as directed by your health care provider. ? Pat the area dry with a clean towel. Do not rub the area. This may cause bleeding.  Follow instructions from your health care provider about how to take care of your incision. Make sure you: ? Wash your hands with soap and water before you change your bandage (dressing). If soap and water are not available, use hand sanitizer. ? Change your dressing as told by your health care provider. ? Leave stitches (sutures), skin glue, or adhesive strips in place. These skin closures may need to stay in place for 2 weeks or longer. If adhesive strip edges start to loosen and curl up, you may trim the loose edges. Do not remove adhesive strips completely unless your health care provider tells you to do that.  Check your incision area every day for signs of infection. Check for: ? More redness, swelling, or pain. ? More fluid or blood. ? Warmth. ? Pus or a bad smell. Activity  Do not lift anything that is heavier than 10 lb (4.5 kg) until your health care provider says it is okay to do so.  For the first 2 weeks, or as long as told by your health care provider: ? Avoid lifting your left arm higher than your shoulder. ? Be gentle when you move your arms over your head. It is okay  to raise your arm to comb your hair. ? Avoid strenuous exercise.  Ask your health care provider when it is okay to: ? Resume your normal activities. ? Return to work or school. ? Resume sexual activity. Eating and drinking  Eat a heart-healthy diet. This should include plenty of fresh fruits and vegetables, whole grains, low-fat dairy products, and lean protein like chicken and fish.  Limit alcohol intake to no more than 1 drink a day for non-pregnant women and 2 drinks a day for men. One drink equals 12 oz of beer, 5 oz of wine, or 1 oz of hard liquor.  Check ingredients and nutrition facts on packaged foods and beverages. Avoid the following types of food: ? Food that is high in salt (sodium). ? Food that is high in saturated fat, like full-fat dairy or red meat. ? Food that is high in trans fat, like fried food. ? Food and drinks that are high in sugar. Lifestyle  Do not use any products that contain nicotine or tobacco, such as cigarettes and e-cigarettes. If you need help quitting, ask your health care provider.  Take steps to manage and control your weight.  Get regular exercise. Aim for 150 minutes of moderate-intensity exercise (such as walking or yoga) or 75 minutes of vigorous exercise (such as running or swimming) each week.  Manage other health problems, such as diabetes or high blood pressure. Ask your health  care provider how you can manage these conditions. General instructions  Do not drive for 24 hours after your procedure if you were given a medicine to help you relax (sedative).  Take over-the-counter and prescription medicines only as told by your health care provider.  Avoid putting pressure on the area where the pacemaker was placed.  If you need an MRI after your pacemaker has been placed, be sure to tell the health care provider who orders the MRI that you have a pacemaker.  Avoid close and prolonged exposure to electrical devices that have strong  magnetic fields. These include: ? Cell phones. Avoid keeping them in a pocket near the pacemaker, and try using the ear opposite the pacemaker. ? MP3 players. ? Household appliances, like microwaves. ? Metal detectors. ? Electric generators. ? High-tension wires.  Keep all follow-up visits as directed by your health care provider. This is important. Contact a health care provider if:  You have pain at the incision site that is not relieved by over-the-counter or prescription medicines.  You have any of these around your incision site or coming from it: ? More redness, swelling, or pain. ? Fluid or blood. ? Warmth to the touch. ? Pus or a bad smell.  You have a fever.  You feel brief, occasional palpitations, light-headedness, or any symptoms that you think might be related to your heart. Get help right away if:  You experience chest pain that is different from the pain at the pacemaker site.  You develop a red streak that extends above or below the incision site.  You experience shortness of breath.  You have palpitations or an irregular heartbeat.  You have light-headedness that does not go away quickly.  You faint or have dizzy spells.  Your pulse suddenly drops or increases rapidly and does not return to normal.  You begin to gain weight and your legs and ankles swell. Summary  After your procedure, it is common to have pain, soreness, and some swelling where the pacemaker was inserted.  Make sure to keep your incision clean and dry. Follow instructions from your health care provider about how to take care of your incision.  Check your incision every day for signs of infection, such as more pain or swelling, pus or a bad smell, warmth, or leaking fluid and blood.  Avoid strenuous exercise and lifting your left arm higher than your shoulder for 2 weeks, or as long as told by your health care provider. This information is not intended to replace advice given to you by  your health care provider. Make sure you discuss any questions you have with your health care provider. Document Revised: 11/27/2017 Document Reviewed: 11/06/2016 Elsevier Patient Education  2020 Elsevier Inc.  

## 2020-05-10 NOTE — Interval H&P Note (Signed)
History and Physical Interval Note:  05/10/2020 1:13 PM  Jacob Warner  has presented today for surgery, with the diagnosis of ERI.  The various methods of treatment have been discussed with the patient and family. After consideration of risks, benefits and other options for treatment, the patient has consented to  Procedure(s): PPM GENERATOR CHANGEOUT (N/A) as a surgical intervention.  The patient's history has been reviewed, patient examined, no change in status, stable for surgery.  I have reviewed the patient's chart and labs.  Questions were answered to the patient's satisfaction.     Cristopher Peru

## 2020-05-11 MED FILL — Lidocaine HCl Local Inj 1%: INTRAMUSCULAR | Qty: 60 | Status: AC

## 2020-05-15 ENCOUNTER — Telehealth: Payer: Self-pay

## 2020-05-15 DIAGNOSIS — H903 Sensorineural hearing loss, bilateral: Secondary | ICD-10-CM | POA: Diagnosis not present

## 2020-05-15 NOTE — Telephone Encounter (Signed)
Patient son called and wanted to reschedule wound check appointment due to him being his only transportation. I assured him its important for patient to have that wound check appointment. Pt will call back as he needs to find someone to make sure patient makes it to that appt.

## 2020-05-22 ENCOUNTER — Ambulatory Visit: Payer: Medicare PPO

## 2020-05-24 ENCOUNTER — Ambulatory Visit (INDEPENDENT_AMBULATORY_CARE_PROVIDER_SITE_OTHER): Payer: Medicare PPO | Admitting: Emergency Medicine

## 2020-05-24 ENCOUNTER — Other Ambulatory Visit: Payer: Self-pay

## 2020-05-24 DIAGNOSIS — R001 Bradycardia, unspecified: Secondary | ICD-10-CM

## 2020-05-24 DIAGNOSIS — Z950 Presence of cardiac pacemaker: Secondary | ICD-10-CM

## 2020-05-24 LAB — CUP PACEART INCLINIC DEVICE CHECK
Battery Remaining Longevity: 114 mo
Brady Statistic RA Percent Paced: 69 %
Brady Statistic RV Percent Paced: 1 %
Date Time Interrogation Session: 20210527152820
Implantable Lead Implant Date: 20130926
Implantable Lead Implant Date: 20130926
Implantable Lead Location: 753859
Implantable Lead Location: 753860
Implantable Lead Model: 4456
Implantable Lead Model: 4476
Implantable Lead Serial Number: 473023
Implantable Lead Serial Number: 523746
Implantable Pulse Generator Implant Date: 20210513
Lead Channel Impedance Value: 340 Ohm
Lead Channel Impedance Value: 499 Ohm
Lead Channel Pacing Threshold Amplitude: 0.6 V
Lead Channel Pacing Threshold Amplitude: 0.9 V
Lead Channel Pacing Threshold Pulse Width: 0.4 ms
Lead Channel Pacing Threshold Pulse Width: 0.4 ms
Lead Channel Sensing Intrinsic Amplitude: 1.7 mV
Lead Channel Setting Pacing Amplitude: 2 V
Lead Channel Setting Pacing Amplitude: 2.5 V
Lead Channel Setting Pacing Pulse Width: 0.4 ms
Lead Channel Setting Sensing Sensitivity: 2.5 mV
Pulse Gen Serial Number: 528325

## 2020-05-24 NOTE — Progress Notes (Signed)
Pacemaker check and wound check  in clinic. Steri -strips removed, wound edges approximated , no edema , drainage or bleeding.Normal device function. Thresholds, sensing, impedances consistent with previous measurements. Device programmed to maximize longevity.  At / AfF burden < 1%, 1 AMS episode that appears to be AT. No high ventricular rates noted. Device programmed at appropriate safety margins. Histogram distribution appropriate for patient activity level. Device programmed to optimize intrinsic conduction. Estimated longevity 9 yrs 6 months. Patient enrolled in remote follow-up and next remote scheduled for 08/13/20. Follow up with Dr Lovena Le on 08/14/20. Patient education completed.

## 2020-06-07 DIAGNOSIS — L821 Other seborrheic keratosis: Secondary | ICD-10-CM | POA: Diagnosis not present

## 2020-06-07 DIAGNOSIS — L438 Other lichen planus: Secondary | ICD-10-CM | POA: Diagnosis not present

## 2020-06-07 DIAGNOSIS — L814 Other melanin hyperpigmentation: Secondary | ICD-10-CM | POA: Diagnosis not present

## 2020-06-07 DIAGNOSIS — Z85828 Personal history of other malignant neoplasm of skin: Secondary | ICD-10-CM | POA: Diagnosis not present

## 2020-06-07 DIAGNOSIS — L309 Dermatitis, unspecified: Secondary | ICD-10-CM | POA: Diagnosis not present

## 2020-06-07 DIAGNOSIS — M810 Age-related osteoporosis without current pathological fracture: Secondary | ICD-10-CM | POA: Diagnosis not present

## 2020-06-07 DIAGNOSIS — L570 Actinic keratosis: Secondary | ICD-10-CM | POA: Diagnosis not present

## 2020-06-29 DIAGNOSIS — H401113 Primary open-angle glaucoma, right eye, severe stage: Secondary | ICD-10-CM | POA: Diagnosis not present

## 2020-07-31 DIAGNOSIS — H401113 Primary open-angle glaucoma, right eye, severe stage: Secondary | ICD-10-CM | POA: Diagnosis not present

## 2020-08-13 LAB — CUP PACEART REMOTE DEVICE CHECK
Battery Remaining Longevity: 108 mo
Battery Remaining Percentage: 100 %
Brady Statistic RA Percent Paced: 72 %
Brady Statistic RV Percent Paced: 8 %
Date Time Interrogation Session: 20210814040000
Implantable Lead Implant Date: 20130926
Implantable Lead Implant Date: 20130926
Implantable Lead Location: 753859
Implantable Lead Location: 753860
Implantable Lead Model: 4456
Implantable Lead Model: 4476
Implantable Lead Serial Number: 473023
Implantable Lead Serial Number: 523746
Implantable Pulse Generator Implant Date: 20210513
Lead Channel Impedance Value: 345 Ohm
Lead Channel Impedance Value: 409 Ohm
Lead Channel Pacing Threshold Amplitude: 0.5 V
Lead Channel Pacing Threshold Pulse Width: 0.4 ms
Lead Channel Setting Pacing Amplitude: 2 V
Lead Channel Setting Pacing Amplitude: 2.5 V
Lead Channel Setting Pacing Pulse Width: 0.4 ms
Lead Channel Setting Sensing Sensitivity: 2.5 mV
Pulse Gen Serial Number: 528325

## 2020-08-14 ENCOUNTER — Other Ambulatory Visit: Payer: Self-pay

## 2020-08-14 ENCOUNTER — Encounter: Payer: Self-pay | Admitting: Internal Medicine

## 2020-08-14 ENCOUNTER — Ambulatory Visit: Payer: Medicare PPO | Admitting: Internal Medicine

## 2020-08-14 VITALS — BP 142/56 | HR 50 | Resp 96 | Ht 70.0 in | Wt 184.8 lb

## 2020-08-14 DIAGNOSIS — Z950 Presence of cardiac pacemaker: Secondary | ICD-10-CM

## 2020-08-14 DIAGNOSIS — I495 Sick sinus syndrome: Secondary | ICD-10-CM | POA: Diagnosis not present

## 2020-08-14 NOTE — Patient Instructions (Signed)
Medication Instructions:  Your physician recommends that you continue on your current medications as directed. Please refer to the Current Medication list given to you today.  Labwork: None ordered.  Testing/Procedures: None ordered.  Follow-Up: Your physician wants you to follow-up in: one year with Dr. Lovena Le.   You will receive a reminder letter in the mail two months in advance. If you don't receive a letter, please call our office to schedule the follow-up appointment.  Remote monitoring is used to monitor your Pacemaker from home. This monitoring reduces the number of office visits required to check your device to one time per year. It allows [place] to keep an eye on the functioning of your device to ensure it is working properly. You are scheduled for a device check from home on 11/12/2020. You may send your transmission at any time that day. If you have a wireless device, the transmission will be sent automatically. After your physician reviews your transmission, you will receive a postcard with your next transmission date.  Any Other Special Instructions Will Be Listed Below (If Applicable).  If you need a refill on your cardiac medications before your next appointment, please call your pharmacy.

## 2020-08-14 NOTE — Progress Notes (Signed)
HPI [name] returns today for followup. He is a pleasant 86 yo man with a h/o sinus node dysfunction, high grade heart block, s/p PPM insertion, PAF, and recent PPM gen change. He had no trouble healing up after his gen change. He denies chest pain, palpitations, or sob. He admits to being sedentary. He has mild peripheral edema due to venous insufficiency.  Allergies  Allergen Reactions  . Sulfa Antibiotics Hives and Rash    Bactrim  . Sulfacetamide Sodium Hives and Rash    Bactrim     Current Outpatient Medications  Medication Sig Dispense Refill  . acetaminophen (TYLENOL) 500 MG tablet Take 500-1,000 mg by mouth See admin instructions. 500 mg twice daily, 1000 mg at bedtime    . aspirin 81 MG tablet Take 81 mg by mouth daily.    Marland Kitchen azelastine (ASTELIN) 0.1 % nasal spray Place 1 spray into both nostrils 2 (two) times daily as needed for rhinitis.     Marland Kitchen denosumab (PROLIA) 60 MG/ML SOSY injection Inject 60 mg into the skin every 6 (six) months.    . dutasteride (AVODART) 0.5 MG capsule Take 0.5 mg by mouth daily.    . furosemide (LASIX) 40 MG tablet Take 40 mg by mouth daily.     Marland Kitchen gabapentin (NEURONTIN) 300 MG capsule Take 300 mg by mouth at bedtime.     . hydroxypropyl methylcellulose / hypromellose (ISOPTO TEARS / GONIOVISC) 2.5 % ophthalmic solution Place 1 drop into both eyes as needed for dry eyes.    Marland Kitchen ketoconazole (NIZORAL) 2 % shampoo Apply 1 application topically 2 (two) times a week.     . lactulose (CHRONULAC) 10 GM/15ML solution Take 10 g by mouth daily.   0  . latanoprost (XALATAN) 0.005 % ophthalmic solution Place 1 drop into both eyes at bedtime.   11  . Magnesium Oxide 400 (240 Mg) MG TABS Take 1 tablet by mouth 2 (two) times daily.  7  . meloxicam (MOBIC) 15 MG tablet Take 15 mg by mouth daily.    . pantoprazole (PROTONIX) 40 MG tablet Take 1 tablet by mouth daily.    . RESTASIS 0.05 % ophthalmic emulsion Place 1 drop into both eyes daily.   11  . traZODone  (DESYREL) 50 MG tablet Take 50 mg by mouth at bedtime.   11  . vitamin B-12 (CYANOCOBALAMIN) 1000 MCG tablet Take 1,000 mcg by mouth daily.     . White Petrolatum-Mineral Oil (EYE LUBRICANT) OINT Apply 1 strip to eye at bedtime.    . nitroGLYCERIN (NITROSTAT) 0.4 MG SL tablet Place 1 tablet (0.4 mg total) under the tongue every 5 (five) minutes as needed for chest pain. 25 tablet 5   Current Facility-Administered Medications  Medication Dose Route Frequency Provider Last Rate Last Admin  . methylPREDNISolone acetate (DEPO-MEDROL) injection 80 mg  80 mg Other Once Magnus Sinning, MD         Past Medical History:  Diagnosis Date  . A-fib ([place])   . Alopecia   . Anemia   . Bradycardia   . Cancer ([place])   . Cherry angioma   . Echocardiogram    Echo 11/19: Mild concentric LVH, EF 50-55, normal wall motion, grade 1 diastolic dysfunction, mild AI, MAC, mild MR, normal RVSF, moderate TR, PASP 36  . GERD (gastroesophageal reflux disease)   . Glaucoma   . Hypertension   . Nuclear stress test    Nuclear stress test 11/19: not gated,  inf and apical defect c/w small scar and/or soft tissue attenuation  . Pelvic fracture ([place])   . Right BBB/left ant fasc block   . Sick sinus syndrome ([place])   . TIA (transient ischemic attack)   . Xeroderma     ROS:   All systems reviewed and negative except as noted in the HPI.   Past Surgical History:  Procedure Laterality Date  . APPENDECTOMY    . CERVICAL SPINE SURGERY    . EXPLORATORY LAPAROTOMY    . LITHOTRIPSY    . PACEMAKER PLACEMENT    . PPM GENERATOR CHANGEOUT N/A 05/10/2020   Procedure: PPM GENERATOR CHANGEOUT;  Surgeon: [name], MD;  Location: [facility];  Service: Cardiovascular;  Laterality: N/A;  . SPINAL CORD STIMULATOR IMPLANT Bilateral 2018   [name], MD PhD, [facility]     Family History  Problem Relation Age of Onset  . Brain cancer Sister      Social History    Socioeconomic History  . Marital status: Married    Spouse name: Not on file  . Number of children: Not on file  . Years of education: Not on file  . Highest education level: Not on file  Occupational History  . Not on file  Tobacco Use  . Smoking status: Never Smoker  . Smokeless tobacco: Never Used  Vaping Use  . Vaping Use: Never used  Substance and Sexual Activity  . Alcohol use: No    Alcohol/week: 0.0 standard drinks  . Drug use: No  . Sexual activity: Not on file  Other Topics Concern  . Not on file  Social History Narrative  . Not on file   Social Determinants of Health   Financial Resource Strain:   . Difficulty of Paying Living Expenses:   Food Insecurity:   . Worried About Charity fundraiser in the Last Year:   . Arboriculturist in the Last Year:   Transportation Needs:   . Film/video editor (Medical):   Marland Kitchen Lack of Transportation (Non-Medical):   Physical Activity:   . Days of Exercise per Week:   . Minutes of Exercise per Session:   Stress:   . Feeling of Stress :   Social Connections:   . Frequency of Communication with Friends and Family:   . Frequency of Social Gatherings with Friends and Family:   . Attends Religious Services:   . Active Member of Clubs or Organizations:   . Attends Archivist Meetings:   Marland Kitchen Marital Status:   Intimate Partner Violence:   . Fear of Current or Ex-Partner:   . Emotionally Abused:   Marland Kitchen Physically Abused:   . Sexually Abused:      BP (!) 142/56   Pulse (!) 50   Resp (!) 96   Ht _0  (1.778 m)   Wt 184 lb 12.8 oz (83.8 kg)   BMI 26.52 kg/m   Physical Exam:  Well appearing NAD HEENT: Unremarkable Neck:  No JVD, no thyromegally Lymphatics:  No adenopathy Back:  No CVA tenderness Lungs:  Clear with no wheezes HEART:  Regular rate rhythm, no murmurs, no rubs, no clicks Abd:  soft, positive bowel sounds, no organomegally, no rebound, no guarding Ext:  2 plus pulses, no edema, no cyanosis, no  clubbing Skin:  No rashes no nodules Neuro:  CN II through XII intact, motor grossly intact  EKG - NSR with first degree AV block and  RBBB with atrial pacing  DEVICE  Normal device function.  See PaceArt for details.   Assess/Plan: 1. PAF - he is maintaining NSR 100% of the time since his last PM gen change out 4 months ago. 2. PPM - his Bosotn Sci DDD PM is working normally. 3. HTN - His SBP is minimally elevated.  4. Venous insufficiency - he has only mild edema today. He will continue lasix.   Salome Spotted.

## 2020-08-17 ENCOUNTER — Telehealth: Payer: Self-pay

## 2020-08-17 NOTE — Telephone Encounter (Signed)
I let the pt know that the monitor is automatic and he do not have to send a manual transmission.

## 2020-08-21 ENCOUNTER — Telehealth: Payer: Self-pay | Admitting: Physical Medicine and Rehabilitation

## 2020-08-21 DIAGNOSIS — Z5181 Encounter for therapeutic drug level monitoring: Secondary | ICD-10-CM | POA: Diagnosis not present

## 2020-08-21 DIAGNOSIS — R399 Unspecified symptoms and signs involving the genitourinary system: Secondary | ICD-10-CM | POA: Diagnosis not present

## 2020-08-21 DIAGNOSIS — R519 Headache, unspecified: Secondary | ICD-10-CM | POA: Diagnosis not present

## 2020-08-21 DIAGNOSIS — C8590 Non-Hodgkin lymphoma, unspecified, unspecified site: Secondary | ICD-10-CM | POA: Diagnosis not present

## 2020-08-21 DIAGNOSIS — E782 Mixed hyperlipidemia: Secondary | ICD-10-CM | POA: Diagnosis not present

## 2020-08-21 NOTE — Telephone Encounter (Signed)
Please advise- Ov vs injection?

## 2020-08-21 NOTE — Telephone Encounter (Signed)
Left message #1

## 2020-08-21 NOTE — Telephone Encounter (Signed)
OV, decide if repeat RFA from 05/2019

## 2020-08-21 NOTE — Telephone Encounter (Signed)
Pt called stating he would like to set up an appt but missed Courtneys call; pt just wanted to let her know he's still interested  [phone]

## 2020-08-21 NOTE — Telephone Encounter (Signed)
Patient's son [name] called requesting to set appt for hip pains. Please call [name] to set appt for patient at [phone]

## 2020-08-21 NOTE — Telephone Encounter (Signed)
Scheduled for OV. 

## 2020-08-23 ENCOUNTER — Ambulatory Visit: Payer: Medicare PPO | Admitting: Cardiology

## 2020-08-24 ENCOUNTER — Other Ambulatory Visit: Payer: Self-pay | Admitting: Family Medicine

## 2020-08-24 ENCOUNTER — Other Ambulatory Visit: Payer: Self-pay

## 2020-08-24 DIAGNOSIS — R319 Hematuria, unspecified: Secondary | ICD-10-CM

## 2020-08-24 DIAGNOSIS — R109 Unspecified abdominal pain: Secondary | ICD-10-CM

## 2020-08-27 ENCOUNTER — Other Ambulatory Visit: Payer: Self-pay | Admitting: Family Medicine

## 2020-08-27 DIAGNOSIS — R109 Unspecified abdominal pain: Secondary | ICD-10-CM

## 2020-08-27 DIAGNOSIS — R319 Hematuria, unspecified: Secondary | ICD-10-CM

## 2020-08-27 DIAGNOSIS — H04121 Dry eye syndrome of right lacrimal gland: Secondary | ICD-10-CM | POA: Diagnosis not present

## 2020-08-27 DIAGNOSIS — R7989 Other specified abnormal findings of blood chemistry: Secondary | ICD-10-CM

## 2020-08-28 ENCOUNTER — Other Ambulatory Visit: Payer: Self-pay | Admitting: Family Medicine

## 2020-08-28 DIAGNOSIS — M5432 Sciatica, left side: Secondary | ICD-10-CM

## 2020-09-04 ENCOUNTER — Other Ambulatory Visit: Payer: Medicare PPO

## 2020-09-04 ENCOUNTER — Ambulatory Visit
Admission: RE | Admit: 2020-09-04 | Discharge: 2020-09-04 | Disposition: A | Discharge status: Home / Self Care | Payer: Medicare PPO | Source: Ambulatory Visit | Attending: Family Medicine | Admitting: Family Medicine

## 2020-09-04 DIAGNOSIS — R7989 Other specified abnormal findings of blood chemistry: Secondary | ICD-10-CM

## 2020-09-04 DIAGNOSIS — R319 Hematuria, unspecified: Secondary | ICD-10-CM

## 2020-09-04 DIAGNOSIS — R109 Unspecified abdominal pain: Secondary | ICD-10-CM

## 2020-09-04 DIAGNOSIS — N281 Cyst of kidney, acquired: Secondary | ICD-10-CM | POA: Diagnosis not present

## 2020-09-06 ENCOUNTER — Telehealth: Payer: Self-pay | Admitting: Hematology

## 2020-09-06 NOTE — Telephone Encounter (Signed)
Received a new hem referral from [name] for abnormal cbc/hx of follicular lymphoma. Jacob Warner has been cld and scheduled to see Jacob Warner on 9/20 at 11am. Appt date and time has been given to the pt's wife. Aware to arrive 20 minutes early.

## 2020-09-10 ENCOUNTER — Other Ambulatory Visit: Payer: Self-pay

## 2020-09-10 ENCOUNTER — Encounter: Payer: Self-pay | Admitting: Physical Medicine and Rehabilitation

## 2020-09-10 ENCOUNTER — Telehealth: Payer: Self-pay | Admitting: Physical Medicine and Rehabilitation

## 2020-09-10 ENCOUNTER — Ambulatory Visit: Payer: Medicare PPO | Admitting: Physical Medicine and Rehabilitation

## 2020-09-10 ENCOUNTER — Ambulatory Visit: Payer: Self-pay

## 2020-09-10 VITALS — BP 118/55 | HR 72

## 2020-09-10 DIAGNOSIS — M47816 Spondylosis without myelopathy or radiculopathy, lumbar region: Secondary | ICD-10-CM | POA: Diagnosis not present

## 2020-09-10 DIAGNOSIS — M1612 Unilateral primary osteoarthritis, left hip: Secondary | ICD-10-CM | POA: Diagnosis not present

## 2020-09-10 DIAGNOSIS — M25552 Pain in left hip: Secondary | ICD-10-CM

## 2020-09-10 DIAGNOSIS — G8929 Other chronic pain: Secondary | ICD-10-CM

## 2020-09-10 DIAGNOSIS — M545 Low back pain, unspecified: Secondary | ICD-10-CM

## 2020-09-10 NOTE — Telephone Encounter (Signed)
Pt has been approve for [id number], [phone].

## 2020-09-10 NOTE — Progress Notes (Signed)
Pt states lower back pain that travel to both hips to his left knee. Pt state sitting down then having to get up. Pt state walk and standing makes the pain. Pt state sitting helps sometime with the pain.  Numeric Pain Rating Scale and Functional Assessment Average Pain 8 Pain Right Now 8 My pain is constant, sharp, burning, tingling and aching Pain is worse with: walking, bending, standing and some activites Pain improves with: rest and medication   In the last MONTH (on 0-10 scale) has pain interfered with the following?  1. General activity like being  able to carry out your everyday physical activities such as walking, climbing stairs, carrying groceries, or moving a chair?  Rating(10)  2. Relation with others like being able to carry out your usual social activities and roles such as  activities at home, at work and in your community. Rating(0)  3. Enjoyment of life such that you have  been bothered by emotional problems such as feeling anxious, depressed or irritable?  Rating(8)

## 2020-09-10 NOTE — Telephone Encounter (Signed)
Needs authorization and scheduling for [id number] and [id number]. Bilateral L4-5 and L5-S1 RFA.

## 2020-09-10 NOTE — Progress Notes (Signed)
Jacob Warner - 86 y.o. male MRN [id number]  Date of birth: 09-Oct-1924  Office Visit Note: Visit Date: 09/10/2020 PCP: [name], MD Referred by: [name], MD  Subjective: Chief Complaint  Patient presents with  . Right Hip - Pain  . Left Hip - Pain  . Left Knee - Pain  . Lower Back - Pain   HPI: Jacob Warner is a 86 y.o. male who comes in today For evaluation and management of exacerbation of 2 chronic conditions.  1 is axial low back pain worse with going from sit to stand and standing.  No radicular pain.  The other condition is left hip and anterior thigh pain down to the knee.  No specific groin pain mention.  He is present with his son who provides some of the history.  Unfortunately his wife who has typically come in the past is under hospice care at this point and there is a lot of stress going on with her.  His biggest complaint is the left hip and thigh pain.  This is worse with going from sit to stand is very severe pain.  He reports not being able to move his leg up and down without a lot of pain.  He is using a cane.  This is a newer pain for him.  No specific injury.  No paresthesias no focal weakness.  His son reports that when he gets in the car he has so much uses hand to move the hip.  Prior x-rays from [name] reviewed of the lumbar spine do show both hips and there are at least moderate degenerative change of both hips.  Patient reports prior history of hip fracture.  There is no history of fixation or arthroplasty.  In terms of his back pain he has back pain across both sides of the lower back worse with standing and ambulating.  More with just standing and ambulating going from sit to stand is problematic.  He has had 3 radiofrequency ablations performed at L4-5 and L5-S1.  The first was by Dr. Laverta Baltimore and those notes can be reviewed.  We ended up repeating the ablation in 2019 with good relief for about a year and repeated the ablation in 2020 almost a year to  the day.  Again no radicular pain or focal weakness.  Patient does have a spinal cord stimulator.  He has had some issues with the remote and battery.  He has tried to call Tillie Rung the representative and has not got a hold of her.  We will make that call for him.  Review of Systems  Constitutional: Positive for weight loss.  Musculoskeletal: Positive for back pain and joint pain.  All other systems reviewed and are negative.  Otherwise per HPI.  Assessment & Plan: Visit Diagnoses:  1. Pain in left hip   2. Spondylosis without myelopathy or radiculopathy, lumbar region   3. Chronic bilateral low back pain without sciatica   4. Unilateral primary osteoarthritis, left hip     Plan: Findings:  1.  Left hip and thigh pain to the knee fairly consistent with what is felt to be pain from his hip joint.  No specific groin pain but he is very tight with internal rotation on exam on the left but not the right.  No pain over the greater trochanter.  He just does seem to go from sit to stand just seems to be hip related.  X-rays revealed at least moderate arthritis.  Did complete diagnostic intra-articular hip injection fluoroscopic guidance today and he had good relief during the anesthetic phase.  Depending on relief that I would follow-up with [name].  Obviously could repeat these from time to time if needed.  Consider MRI of the pelvis and hip.  2.  Chronic worsening axial low back pain worse with facet loading on exam and standing clinically.  Has had prior radiofrequency ablation with really good relief on 3 occasions.  The last was in June 2020.  This was radiofrequency ablation of the L4-5 and L5-S1 facet joints.  We will schedule him for repeat ablation procedure.  He will continue with home exercises and medication.  3.  We will contact [employer] to get in contact with him about his stimulator.    Meds & Orders: No orders of the defined types were placed in this  encounter.   Orders Placed This Encounter  Procedures  . Large Joint Inj: L hip joint  . XR C-ARM NO REPORT    Follow-up: Return for Lumbar radiofrequency ablation.   Procedures: Large Joint Inj: L hip joint on 09/10/2020 11:39 AM Indications: diagnostic evaluation and pain Details: 22 G 3.5 in needle, fluoroscopy-guided anterior approach  Arthrogram: No  Medications: 4 mL bupivacaine 0.25 %; 60 mg triamcinolone acetonide 40 MG/ML Outcome: tolerated well, no immediate complications  There was excellent flow of contrast producing a partial arthrogram of the hip. The patient did have relief of symptoms during the anesthetic phase of the injection. Procedure, treatment alternatives, risks and benefits explained, specific risks discussed. Consent was given by the patient. Immediately prior to procedure a time out was called to verify the correct patient, procedure, equipment, support staff and site/side marked as required. Patient was prepped and draped in the usual sterile fashion.      No notes on file   Clinical History: CT PELVIS WITHOUT CONTRAST  TECHNIQUE: Multidetector CT imaging of the pelvis was performed following the standard protocol without intravenous contrast.  COMPARISON:  CT of the abdomen and pelvis on 06/26/2016, plain films 07/25/2018  FINDINGS: Urinary Tract: Normal appearance of the urinary bladder and distal ureters.  Bowel: Nonobstructed bowel-gas pattern. There are numerous radiopaque tablets within the cecum and descending colon. I count approximately 25 tablets in the region of the cecum. These are primarily uniform and size, measuring approximately 1 centimeter in diameter. Findings are consistent with stasis but also raise the possibility of numerous tablets ingested at a single time.  Vascular/Lymphatic: There is dense atherosclerotic calcification of the abdominal aorta and femoral arteries. No aneurysm. No retroperitoneal or mesenteric  adenopathy.  Reproductive:  Prostatic calcifications are present.  Other: There is partially imaged soft tissue in the region of the superior mesenteric artery and vein, similar in appearance to the previous exams.  Musculoskeletal: Remote fractures of the superior and inferior pubic rami bilaterally. Remote LEFT sacral fracture. No evidence for acute fracture or subluxation. Nondisplaced fractures cannot be excluded. Small amount of stranding in the subcutaneous region of the LEFT buttock.  IMPRESSION: 1. No acute, displaced fracture. Depending on the clinical suspicion, MRI may be helpful in detecting an nondisplaced fracture. 2. Remote fractures of the LEFT sacrum, superior and inferior pubic rami bilaterally. 3. Numerous radiopaque tablets in the proximal colon. Although the findings could be related to stasis, overdose needs to be considered. 4. Persistent soft tissue in the region the superior mesenteric artery and vein. The appearance is nonspecific and stable. 5. These results were called  by telephone at the time of interpretation on 07/29/2018 at 11:37 am to Dr. Basil Dess , who verbally acknowledged these results.   Electronically Signed   By: Nolon Nations M.D.   On: 07/29/2018 11:37   He reports that he has never smoked. He has never used smokeless tobacco. No results for input(s): HGBA1C, LABURIC in the last 8760 hours.  Objective:  VS:  HT:    WT:   BMI:     BP:(!) 118/55  HR:72bpm  TEMP: ( )  RESP:  Physical Exam Vitals and nursing note reviewed.  Constitutional:      General: He is not in acute distress.    Appearance: Normal appearance. He is well-developed.  HENT:     Head: Normocephalic and atraumatic.  Eyes:     Conjunctiva/sclera: Conjunctivae normal.     Pupils: Pupils are equal, round, and reactive to light.  Cardiovascular:     Rate and Rhythm: Normal rate.     Pulses: Normal pulses.     Heart sounds: Normal heart sounds.    Pulmonary:     Effort: Pulmonary effort is normal. No respiratory distress.  Musculoskeletal:     Cervical back: Normal range of motion and neck supple. No rigidity.     Right lower leg: No edema.     Left lower leg: No edema.     Comments: Patient slow to rise from a seated position and ambulating with a cane in the right hand.  He has restricted range of motion of the left hip internal rotation versus the right and it does cause concordant pain in the thigh.  Pain goes to the knee.  He does have concordant back pain as well with extension and facet loading he has good distal strength.  No clonus.  Skin:    General: Skin is warm and dry.     Findings: No erythema or rash.  Neurological:     General: No focal deficit present.     Mental Status: He is alert and oriented to person, place, and time.     Sensory: No sensory deficit.     Coordination: Coordination normal.     Gait: Gait normal.  Psychiatric:        Mood and Affect: Mood normal.        Behavior: Behavior normal.     Ortho Exam  Imaging: XR C-ARM NO REPORT  Result Date: 09/10/2020 Please see Notes tab for imaging impression.   Past Medical/Family/Surgical/Social History: Medications & Allergies reviewed per EMR, new medications updated. Patient Active Problem List   Diagnosis Date Noted  . S/P insertion of spinal cord stimulator 02/15/2020  . Spondylosis without myelopathy or radiculopathy, lumbar region 02/15/2020  . Dizzy 09/14/2019  . Chronic diastolic heart failure ([place]) 08/03/2019  . Cough 11/10/2018  . PAF (paroxysmal atrial fibrillation) ([place]) 11/02/2018  . De Quervain's tenosynovitis, right 02/09/2018  . Wrist pain, acute, right 02/09/2018  . Post laminectomy syndrome 03/19/2017  . Chronic pain syndrome 03/19/2017  . Chronic bilateral low back pain without sciatica 03/19/2017  . After cataract of both eyes not obscuring vision 03/03/2016  . 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 Primary  open-angle glaucoma, right eye, indeterminate stage 03/03/2016  . Squamous blepharitis of upper and lower eyelids of both eyes 03/03/2016  . Bradycardia 05/31/2015  . Pacemaker 05/31/2015  . Peripheral edema 05/31/2015  . Heart murmur 05/31/2015  . Sick sinus syndrome ([place]) 05/31/2015  . Nonsustained ventricular tachycardia (  [place]) 05/31/2015  . Follicular lymphoma ([place]) 04/02/2015   Past Medical History:  Diagnosis Date  . A-fib ([place])   . Alopecia   . Anemia   . Bradycardia   . Cancer ([place])   . Cherry angioma   . Echocardiogram    Echo 11/19: Mild concentric LVH, EF 50-55, normal wall motion, grade 1 diastolic dysfunction, mild AI, MAC, mild MR, normal RVSF, moderate TR, PASP 36  . GERD (gastroesophageal reflux disease)   . Glaucoma   . Hypertension   . Nuclear stress test    Nuclear stress test 11/19: not gated, inf and apical defect c/w small scar and/or soft tissue attenuation  . Pelvic fracture ([place])   . Right BBB/left ant fasc block   . Sick sinus syndrome ([place])   . TIA (transient ischemic attack)   . Xeroderma    Family History  Problem Relation Age of Onset  . Brain cancer Sister    Past Surgical History:  Procedure Laterality Date  . APPENDECTOMY    . CERVICAL SPINE SURGERY    . EXPLORATORY LAPAROTOMY    . LITHOTRIPSY    . PACEMAKER PLACEMENT    . PPM GENERATOR CHANGEOUT N/A 05/10/2020   Procedure: PPM GENERATOR CHANGEOUT;  Surgeon: [name], MD;  Location: [place] [facility];  Service: Cardiovascular;  Laterality: N/A;  . SPINAL CORD STIMULATOR IMPLANT Bilateral 2018   [name], MD PhD, [facility]   Social History   Occupational History  . Not on file  Tobacco Use  . Smoking status: Never Smoker  . Smokeless tobacco: Never Used  Vaping Use  . Vaping Use: Never used  Substance and Sexual Activity  . Alcohol use: No    Alcohol/week: 0.0 standard drinks  . Drug use: No  . Sexual activity: Not on file

## 2020-09-11 ENCOUNTER — Encounter: Payer: Self-pay | Admitting: Physical Medicine and Rehabilitation

## 2020-09-11 MED ORDER — TRIAMCINOLONE ACETONIDE 40 MG/ML IJ SUSP
60.0000 mg | INTRAMUSCULAR | Status: AC | PRN
  Administered 2020-09-10: 60 mg via INTRA_ARTICULAR

## 2020-09-11 MED ORDER — BUPIVACAINE HCL 0.25 % IJ SOLN
4.0000 mL | INTRAMUSCULAR | Status: AC | PRN
  Administered 2020-09-10: 4 mL via INTRA_ARTICULAR

## 2020-09-17 ENCOUNTER — Inpatient Hospital Stay: Payer: Medicare PPO

## 2020-09-17 ENCOUNTER — Telehealth: Payer: Self-pay | Admitting: Hematology

## 2020-09-17 ENCOUNTER — Other Ambulatory Visit: Payer: Self-pay | Admitting: *Deleted

## 2020-09-17 ENCOUNTER — Other Ambulatory Visit: Payer: Self-pay

## 2020-09-17 ENCOUNTER — Inpatient Hospital Stay: Payer: Medicare PPO | Attending: Hematology | Admitting: Hematology

## 2020-09-17 VITALS — BP 126/50 | HR 56 | Temp 95.5°F | Resp 18 | Ht 70.0 in | Wt 176.2 lb

## 2020-09-17 DIAGNOSIS — D649 Anemia, unspecified: Secondary | ICD-10-CM

## 2020-09-17 DIAGNOSIS — Z7982 Long term (current) use of aspirin: Secondary | ICD-10-CM | POA: Diagnosis not present

## 2020-09-17 DIAGNOSIS — D539 Nutritional anemia, unspecified: Secondary | ICD-10-CM | POA: Diagnosis not present

## 2020-09-17 DIAGNOSIS — N400 Enlarged prostate without lower urinary tract symptoms: Secondary | ICD-10-CM

## 2020-09-17 DIAGNOSIS — Z8673 Personal history of transient ischemic attack (TIA), and cerebral infarction without residual deficits: Secondary | ICD-10-CM | POA: Diagnosis not present

## 2020-09-17 DIAGNOSIS — Z808 Family history of malignant neoplasm of other organs or systems: Secondary | ICD-10-CM

## 2020-09-17 DIAGNOSIS — I10 Essential (primary) hypertension: Secondary | ICD-10-CM | POA: Diagnosis not present

## 2020-09-17 DIAGNOSIS — Z79899 Other long term (current) drug therapy: Secondary | ICD-10-CM

## 2020-09-17 DIAGNOSIS — Z791 Long term (current) use of non-steroidal anti-inflammatories (NSAID): Secondary | ICD-10-CM | POA: Diagnosis not present

## 2020-09-17 DIAGNOSIS — K219 Gastro-esophageal reflux disease without esophagitis: Secondary | ICD-10-CM | POA: Diagnosis not present

## 2020-09-17 DIAGNOSIS — R319 Hematuria, unspecified: Secondary | ICD-10-CM

## 2020-09-17 DIAGNOSIS — Z8572 Personal history of non-Hodgkin lymphomas: Secondary | ICD-10-CM | POA: Diagnosis not present

## 2020-09-17 DIAGNOSIS — Z950 Presence of cardiac pacemaker: Secondary | ICD-10-CM | POA: Diagnosis not present

## 2020-09-17 LAB — URINALYSIS, COMPLETE (UACMP) WITH MICROSCOPIC
Bacteria, UA: NONE SEEN
Bilirubin Urine: NEGATIVE
Glucose, UA: NEGATIVE mg/dL
Ketones, ur: NEGATIVE mg/dL
Leukocytes,Ua: NEGATIVE
Nitrite: NEGATIVE
Protein, ur: NEGATIVE mg/dL
Specific Gravity, Urine: 1.01 (ref 1.005–1.030)
pH: 5 (ref 5.0–8.0)

## 2020-09-17 LAB — IRON AND TIBC
Iron: 61 ug/dL (ref 42–163)
Saturation Ratios: 22 % (ref 20–55)
TIBC: 279 ug/dL (ref 202–409)
UIBC: 218 ug/dL (ref 117–376)

## 2020-09-17 LAB — CMP (CANCER CENTER ONLY)
ALT: 17 U/L (ref 0–44)
AST: 22 U/L (ref 15–41)
Albumin: 4.1 g/dL (ref 3.5–5.0)
Alkaline Phosphatase: 96 U/L (ref 38–126)
Anion gap: 8 (ref 5–15)
BUN: 31 mg/dL — ABNORMAL HIGH (ref 8–23)
CO2: 31 mmol/L (ref 22–32)
Calcium: 9.4 mg/dL (ref 8.9–10.3)
Chloride: 103 mmol/L (ref 98–111)
Creatinine: 1.43 mg/dL — ABNORMAL HIGH (ref 0.61–1.24)
GFR, Est AFR Am: 49 mL/min — ABNORMAL LOW (ref >60)
GFR, Estimated: 42 mL/min — ABNORMAL LOW (ref >60)
Glucose, Bld: 89 mg/dL (ref 70–99)
Potassium: 4.2 mmol/L (ref 3.5–5.1)
Sodium: 142 mmol/L (ref 135–145)
Total Bilirubin: 0.5 mg/dL (ref 0.3–1.2)
Total Protein: 6.8 g/dL (ref 6.5–8.1)

## 2020-09-17 LAB — CBC WITH DIFFERENTIAL/PLATELET
Abs Immature Granulocytes: 0.02 10*3/uL (ref 0.00–0.07)
Basophils Absolute: 0 10*3/uL (ref 0.0–0.1)
Basophils Relative: 0 %
Eosinophils Absolute: 0.2 10*3/uL (ref 0.0–0.5)
Eosinophils Relative: 2 %
HCT: 34.8 % — ABNORMAL LOW (ref 39.0–52.0)
Hemoglobin: 11.3 g/dL — ABNORMAL LOW (ref 13.0–17.0)
Immature Granulocytes: 0 %
Lymphocytes Relative: 15 %
Lymphs Abs: 1.1 10*3/uL (ref 0.7–4.0)
MCH: 32.1 pg (ref 26.0–34.0)
MCHC: 32.5 g/dL (ref 30.0–36.0)
MCV: 98.9 fL (ref 80.0–100.0)
Monocytes Absolute: 0.6 10*3/uL (ref 0.1–1.0)
Monocytes Relative: 8 %
Neutro Abs: 5.6 10*3/uL (ref 1.7–7.7)
Neutrophils Relative %: 75 %
Platelets: 204 10*3/uL (ref 150–400)
RBC: 3.52 MIL/uL — ABNORMAL LOW (ref 4.22–5.81)
RDW: 13.4 % (ref 11.5–15.5)
WBC: 7.4 10*3/uL (ref 4.0–10.5)
nRBC: 0 % (ref 0.0–0.2)

## 2020-09-17 LAB — FERRITIN: Ferritin: 157 ng/mL (ref 24–336)

## 2020-09-17 LAB — VITAMIN B12: Vitamin B-12: 751 pg/mL (ref 180–914)

## 2020-09-17 NOTE — Telephone Encounter (Signed)
Scheduled per 9/20 los. Printed avs and calendar for pt.  

## 2020-09-17 NOTE — Progress Notes (Signed)
HEMATOLOGY/ONCOLOGY CONSULTATION NOTE  Date of Service: 09/17/2020  Patient Care Team: [name], MD as PCP - General (Family Medicine) [name], MD as PCP - Cardiology (Cardiology) [name], MD as PCP - Electrophysiology (Cardiology)  CHIEF COMPLAINTS/PURPOSE OF CONSULTATION:  Abnormal CBC/ Hx of Follicular Lymphoma  HISTORY OF PRESENTING ILLNESS:   Jacob Warner is a wonderful 86 y.o. male who has been referred to [place] by Dr. [name] for evaluation and management of abnormal CBC/history of follicular lymphoma. Pt is accompanied today by his son. The pt reports that he is doing well overall.   The pt reports that he was diagnosed with and treated for Follicular Lymphoma in 6222. He completed 5 months of Ritxuan, Cytoxan, and Prednisone. He has since been in remission.   A few months ago pt began having pain in his right-sided lower back. He found relief from this discomfort after he increased his water intake. The pain as since returned and remains despite the pt continuing to drink an adequate amount of water. Pt was referred to a Nephrologist after discussing his back pain with [name], his PCP. Pt has previously had kidney stones and has been told that he has BPH, but this has not disturbed his urinary habits. He denies seeing any blood in his urine, but was found to have hematuria on recent labs with [name]. Pt had an abdominal US in September, which showed a benign left kidney cyst. Pt has chronic back pain for which he receives steroid injections.    He has no history of thyroid disorders. Pt takes a baby Aspirin for his Afib and Pantoprazole daily. Pt has a history of dysphagia and has required esophageal dilation. He has lost nearly 40 lbs in the last six months. Pt has been eating less the last few months due to a decreased appetite and a change in routine. He also reports smaller stools in the last 6-8 months. His stools used to be round, but are now much  thinner. This is completely new and persistent.   Of note since the patient's last visit, pt has had [place] Abd ([id number]) completed on 09/04/2020 with results revealing "Stable 2.8 x 3.5 x 2.3 cm left superior pole simple renal cyst. Otherwise unremarkable upper abdominal ultrasound with the pancreas not visualized."  Most recent lab results (08/21/2020) of CBC w/diff and CMP is as follows: all values are WNL except for RBC at 3.05, Hgb at 9.9, HCT at 29.3, MCV at 96.0, Neutro Rel at 73.8, Lymphs Rel at 10.3, Lymphs Abs at 0.90K, Glucose at 103, BUN at 32, Creatinine at 1.46, GFR Est Non Af Am at 45, ALP at 140.  On review of systems, pt reports dysphagia, weight loss, low appetite, back pain, change in appearance of stools, neuropathy and denies nausea, abdominal pain, hematuria, fevers, chills, night sweats, new lumps/bumps, bloody stools, abdominal pain, testicular pain/swelling and any other symptoms.   On PMHx the pt reports Follicular Lymphoma, Cervical Spine Surgery, Spinal Cord Stimulator Implant, Afib, Pelvic fracture, Reflux, BPH, Kidney Stones.  MEDICAL HISTORY:  Past Medical History:  Diagnosis Date  . A-fib ([place])   . Alopecia   . Anemia   . Bradycardia   . Cancer ([place])   . Cherry angioma   . Echocardiogram    Echo 11/19: Mild concentric LVH, EF 50-55, normal wall motion, grade 1 diastolic dysfunction, mild AI, MAC, mild MR, normal RVSF, moderate TR, PASP 36  . GERD (gastroesophageal reflux  disease)   . Glaucoma   . Hypertension   . Nuclear stress test    Nuclear stress test 11/19: not gated, inf and apical defect c/w small scar and/or soft tissue attenuation  . Pelvic fracture ([place])   . Right BBB/left ant fasc block   . Sick sinus syndrome ([place])   . TIA (transient ischemic attack)   . Xeroderma     SURGICAL HISTORY: Past Surgical History:  Procedure Laterality Date  . APPENDECTOMY    . CERVICAL SPINE SURGERY    . EXPLORATORY LAPAROTOMY    . LITHOTRIPSY    .  PACEMAKER PLACEMENT    . PPM GENERATOR CHANGEOUT N/A 05/10/2020   Procedure: PPM GENERATOR CHANGEOUT;  Surgeon: [name], MD;  Location: [place] [facility];  Service: Cardiovascular;  Laterality: N/A;  . SPINAL CORD STIMULATOR IMPLANT Bilateral 2018   [name], MD PhD, [facility]    SOCIAL HISTORY: Social History   Socioeconomic History  . Marital status: Married    Spouse name: Not on file  . Number of children: Not on file  . Years of education: Not on file  . Highest education level: Not on file  Occupational History  . Not on file  Tobacco Use  . Smoking status: Never Smoker  . Smokeless tobacco: Never Used  Vaping Use  . Vaping Use: Never used  Substance and Sexual Activity  . Alcohol use: No    Alcohol/week: 0.0 standard drinks  . Drug use: No  . Sexual activity: Not on file  Other Topics Concern  . Not on file  Social History Narrative  . Not on file   Social Determinants of Health   Financial Resource Strain:   . Difficulty of Paying Living Expenses: Not on file  Food Insecurity:   . Worried About Charity fundraiser in the Last Year: Not on file  . Ran Out of Food in the Last Year: Not on file  Transportation Needs:   . Lack of Transportation (Medical): Not on file  . Lack of Transportation (Non-Medical): Not on file  Physical Activity:   . Days of Exercise per Week: Not on file  . Minutes of Exercise per Session: Not on file  Stress:   . Feeling of Stress : Not on file  Social Connections:   . Frequency of Communication with Friends and Family: Not on file  . Frequency of Social Gatherings with Friends and Family: Not on file  . Attends Religious Services: Not on file  . Active Member of Clubs or Organizations: Not on file  . Attends Archivist Meetings: Not on file  . Marital Status: Not on file  Intimate Partner Violence:   . Fear of Current or Ex-Partner: Not on file  . Emotionally Abused: Not on  file  . Physically Abused: Not on file  . Sexually Abused: Not on file    FAMILY HISTORY: Family History  Problem Relation Age of Onset  . Brain cancer Sister     ALLERGIES:  is allergic to sulfa antibiotics and sulfacetamide sodium.  MEDICATIONS:  Current Outpatient Medications  Medication Sig Dispense Refill  . acetaminophen (TYLENOL) 500 MG tablet Take 500-1,000 mg by mouth See admin instructions. 500 mg twice daily, 1000 mg at bedtime    . aspirin 81 MG tablet Take 81 mg by mouth daily.    Marland Kitchen azelastine (ASTELIN) 0.1 % nasal spray Place 1 spray into both nostrils 2 (two) times daily  as needed for rhinitis.     Marland Kitchen denosumab (PROLIA) 60 MG/ML SOSY injection Inject 60 mg into the skin every 6 (six) months.    . dutasteride (AVODART) 0.5 MG capsule Take 0.5 mg by mouth daily.    . furosemide (LASIX) 40 MG tablet Take 40 mg by mouth daily.     Marland Kitchen gabapentin (NEURONTIN) 300 MG capsule Take 300 mg by mouth at bedtime.     Marland Kitchen HYDROcodone-acetaminophen (NORCO/VICODIN) 5-325 MG tablet     . hydroxypropyl methylcellulose / hypromellose (ISOPTO TEARS / GONIOVISC) 2.5 % ophthalmic solution Place 1 drop into both eyes as needed for dry eyes.    Marland Kitchen ketoconazole (NIZORAL) 2 % shampoo Apply 1 application topically 2 (two) times a week.     . lactulose (CHRONULAC) 10 GM/15ML solution Take 10 g by mouth daily.   0  . latanoprost (XALATAN) 0.005 % ophthalmic solution Place 1 drop into both eyes at bedtime.   11  . Magnesium Oxide 400 (240 Mg) MG TABS Take 1 tablet by mouth 2 (two) times daily.  7  . meloxicam (MOBIC) 15 MG tablet Take 15 mg by mouth daily.    . nitroGLYCERIN (NITROSTAT) 0.4 MG SL tablet Place 1 tablet (0.4 mg total) under the tongue every 5 (five) minutes as needed for chest pain. 25 tablet 5  . pantoprazole (PROTONIX) 40 MG tablet Take 1 tablet by mouth daily.    . RESTASIS 0.05 % ophthalmic emulsion Place 1 drop into both eyes daily.   11  . ROCKLATAN 0.02-0.005 % SOLN     .  topiramate (TOPAMAX) 25 MG tablet     . traZODone (DESYREL) 50 MG tablet Take 50 mg by mouth at bedtime.   11  . vitamin B-12 (CYANOCOBALAMIN) 1000 MCG tablet Take 1,000 mcg by mouth daily.     . White Petrolatum-Mineral Oil (EYE LUBRICANT) OINT Apply 1 strip to eye at bedtime.     Current Facility-Administered Medications  Medication Dose Route Frequency Provider Last Rate Last Admin  . methylPREDNISolone acetate (DEPO-MEDROL) injection 80 mg  80 mg Other Once Magnus Sinning, MD        REVIEW OF SYSTEMS:   A 10+ POINT REVIEW OF SYSTEMS WAS OBTAINED including neurology, dermatology, psychiatry, cardiac, respiratory, lymph, extremities, GI, GU, Musculoskeletal, constitutional, breasts, reproductive, HEENT.  All pertinent positives are noted in the HPI.  All others are negative.   PHYSICAL EXAMINATION: ECOG PERFORMANCE STATUS: 1 - Symptomatic but completely ambulatory  . Vitals:   09/17/20 1111  BP: (!) 126/50  Pulse: (!) 56  Resp: 18  Temp: (!) 95.5 F (35.3 C)  SpO2: 99%   Filed Weights   09/17/20 1111  Weight: 176 lb 3.2 oz (79.9 kg)   .Body mass index is 25.28 kg/m.  GENERAL:alert, in no acute distress and comfortable SKIN: no acute rashes, no significant lesions EYES: conjunctiva are pink and non-injected, sclera anicteric OROPHARYNX: MMM, no exudates, no oropharyngeal erythema or ulceration NECK: supple, no JVD LYMPH:  no palpable lymphadenopathy in the cervical, axillary or inguinal regions LUNGS: clear to auscultation b/l with normal respiratory effort HEART: regular rate & rhythm ABDOMEN:  normoactive bowel sounds , non tender, not distended. Extremity: no pedal edema PSYCH: alert & oriented x 3 with fluent speech NEURO: no focal motor/sensory deficits  LABORATORY DATA:  I have reviewed the data as listed  . CBC Latest Ref Rng & Units 09/17/2020 04/20/2020 11/04/2019  WBC 4.0 - 10.5 K/uL 7.4 5.5 6.5  Hemoglobin 13.0 - 17.0 g/dL 11.3(L) 11.4(L) 11.7(L)    Hematocrit 39 - 52 % 34.8(L) 34.2(L) 35.7(L)  Platelets 150 - 400 K/uL 204 221 176   . CBC    Component Value Date/Time   WBC 7.4 09/17/2020 1204   RBC 3.52 (L) 09/17/2020 1204   HGB 11.3 (L) 09/17/2020 1204   HGB 11.4 (L) 04/20/2020 1106   HGB 12.2 (L) 07/08/2017 1336   HCT 34.1 (L) 09/17/2020 1205   HCT 34.8 (L) 09/17/2020 1204   HCT 35.6 (L) 07/08/2017 1336   PLT 204 09/17/2020 1204   PLT 221 04/20/2020 1106   MCV 98.9 09/17/2020 1204   MCV 97 04/20/2020 1106   MCV 96.2 07/08/2017 1336   MCH 32.1 09/17/2020 1204   MCHC 32.5 09/17/2020 1204   RDW 13.4 09/17/2020 1204   RDW 13.2 04/20/2020 1106   RDW 13.9 07/08/2017 1336   LYMPHSABS 1.1 09/17/2020 1204   LYMPHSABS 1.3 04/20/2020 1106   LYMPHSABS 0.8 (L) 07/08/2017 1336   MONOABS 0.6 09/17/2020 1204   MONOABS 0.5 07/08/2017 1336   EOSABS 0.2 09/17/2020 1204   EOSABS 0.2 04/20/2020 1106   BASOSABS 0.0 09/17/2020 1204   BASOSABS 0.0 04/20/2020 1106   BASOSABS 0.0 07/08/2017 1336    . CMP Latest Ref Rng & Units 09/17/2020 04/20/2020 02/08/2020  Glucose 70 - 99 mg/dL 89 82 92  BUN 8 - 23 mg/dL 31(H) 27 25  Creatinine 0.61 - 1.24 mg/dL 1.43(H) 1.51(H) 1.64(H)  Sodium 135 - 145 mmol/L 142 139 141  Potassium 3.5 - 5.1 mmol/L 4.2 4.5 4.7  Chloride 98 - 111 mmol/L 103 105 103  CO2 22 - 32 mmol/L 31 30(H) 24  Calcium 8.9 - 10.3 mg/dL 9.4 9.4 8.8  Total Protein 6.5 - 8.1 g/dL 6.8 - 5.9(L)  Total Bilirubin 0.3 - 1.2 mg/dL 0.5 - 0.4  Alkaline Phos 38 - 126 U/L 96 - 57  AST 15 - 41 U/L 22 - 24  ALT 0 - 44 U/L 17 - 14     RADIOGRAPHIC STUDIES: I have personally reviewed the radiological images as listed and agreed with the findings in the report. US Abdomen Complete  Result Date: 09/04/2020 CLINICAL DATA:  Hematuria, rule out mass. EXAM: ABDOMEN ULTRASOUND COMPLETE COMPARISON:  CT abdomen pelvis 06/26/2016. FINDINGS: Gallbladder: No gallstones or wall thickening visualized. No sonographic Murphy sign noted by sonographer.  Common bile duct: Diameter: 3.9 mm. Liver: No focal lesion identified. Within normal limits in parenchymal echogenicity. Portal vein is patent on color Doppler imaging with normal direction of blood flow towards the liver. IVC: No abnormality visualized. Pancreas: Not visualized due to bowel gas. Spleen: Size and appearance within normal limits. Right Kidney: Length: 11.1 cm. Echogenicity within normal limits. No mass or hydronephrosis visualized. Left Kidney: Length: 10.2 cm. Echogenicity within normal limits. There is a grossly unchanged (compared to CT abdomen pelvis 06/26/2016 close) 2.8 x 3.5 x 2.3 cm simple renal cyst in the superior pole of the left kidney. No hydronephrosis visualized. Abdominal aorta: No aneurysm visualized. Other findings: None. IMPRESSION: Stable 2.8 x 3.5 x 2.3 cm left superior pole simple renal cyst. Otherwise unremarkable upper abdominal ultrasound with the pancreas not visualized. Electronically Signed   By: Iven Finn M.D.   On: 09/04/2020 14:31   XR C-ARM NO REPORT  Result Date: 09/10/2020 Please see Notes tab for imaging impression.   ASSESSMENT & PLAN:   86 yo with   1) Mild macrocytic anemia 2) h/o follicular lymphoma  in 2008 rx with R-CVP PLAN: -Discussed patient's most recent labs from 08/21/2020, all values are WNL except for RBC at 3.05, Hgb at 9.9, HCT at 29.3, MCV at 96.0, Neutro Rel at 73.8, Lymphs Rel at 10.3, Lymphs Abs at 0.90K, Glucose at 103, BUN at 32, Creatinine at 1.46, GFR Est Non Af Am at 45, ALP at 140. -Advised pt that his MCV was elevated, which suggests nutritional deficiencies.  -Advised pt that CKD, nutritional deficiencies, ongoing hematuria could be contributing to anemia.  -Advised pt that Follicular Lymphoma is slow-growing, but often recurs. -Advised pt that there could be myelodysplastic changes in the bone marrow due to age.  -Advised pt that a complete work up would include labwork, CT scans, and Bx. Would hold biopsy and  scans unless labwork suggests this. -Advised pt that his back pain is likely due to previous injury and not kidney dysfunction based on stability of labs.   -Recommend pt eat three meals per day and add in nutritional supplements as necessary.  -Recommend pt f/u with [name] for evaluation of bowel habit changes. -Will get labs today, including urinalysis -Will see back in 2 weeks via phone   FOLLOW UP: Labs today Phone visit with Dr Irene Limbo in 2 weeks  . Orders Placed This Encounter  Procedures  . CBC with Differential/Platelet    Standing Status:   Future    Number of Occurrences:   1    Standing Expiration Date:   09/17/2021  . CMP ([place] only)    Standing Status:   Future    Number of Occurrences:   1    Standing Expiration Date:   09/17/2021  . Ferritin    Standing Status:   Future    Number of Occurrences:   1    Standing Expiration Date:   09/17/2021  . Iron and TIBC    Standing Status:   Future    Number of Occurrences:   1    Standing Expiration Date:   09/17/2021  . Vitamin B12    Standing Status:   Future    Number of Occurrences:   1    Standing Expiration Date:   09/17/2021  . Folate RBC    Standing Status:   Future    Number of Occurrences:   1    Standing Expiration Date:   09/17/2021  . Multiple Myeloma Panel (SPEP&IFE w/QIG)    Standing Status:   Future    Number of Occurrences:   1    Standing Expiration Date:   09/17/2021  . Kappa/lambda light chains    Standing Status:   Future    Number of Occurrences:   1    Standing Expiration Date:   09/17/2021  . Urinalysis    Standing Status:   Future    Number of Occurrences:   1    Standing Expiration Date:   09/17/2021    All of the patients questions were answered with apparent satisfaction. The patient knows to call the clinic with any problems, questions or concerns.  I spent 35 mins counseling the patient face to face. The total time spent in the appointment was 45 minutes and more than 50% was on  counseling and direct patient cares.    Sullivan Lone MD [place] AAHIVMS [facility] Hematology/Oncology Physician [facility]  (Office):       [phone] (Work cell):  [phone] (Fax):           [phone]  09/17/2020 4:57 PM  I, Yevette Edwards, am acting as a scribe for Dr. Sullivan Lone.   .I have reviewed the above documentation for accuracy and completeness, and I agree with the above. Brunetta Genera MD

## 2020-09-18 LAB — KAPPA/LAMBDA LIGHT CHAINS
Kappa free light chain: 15.8 mg/L (ref 3.3–19.4)
Kappa, lambda light chain ratio: 1.53 (ref 0.26–1.65)
Lambda free light chains: 10.3 mg/L (ref 5.7–26.3)

## 2020-09-18 LAB — FOLATE RBC
Folate, Hemolysate: 366 ng/mL
Folate, RBC: 1073 ng/mL (ref >498)
Hematocrit: 34.1 % — ABNORMAL LOW (ref 37.5–51.0)

## 2020-09-19 LAB — MULTIPLE MYELOMA PANEL, SERUM
Albumin SerPl Elph-Mcnc: 3.9 g/dL (ref 2.9–4.4)
Albumin/Glob SerPl: 1.6 (ref 0.7–1.7)
Alpha 1: 0.3 g/dL (ref 0.0–0.4)
Alpha2 Glob SerPl Elph-Mcnc: 0.8 g/dL (ref 0.4–1.0)
B-Globulin SerPl Elph-Mcnc: 0.8 g/dL (ref 0.7–1.3)
Gamma Glob SerPl Elph-Mcnc: 0.6 g/dL (ref 0.4–1.8)
Globulin, Total: 2.5 g/dL (ref 2.2–3.9)
IgA: 108 mg/dL (ref 61–437)
IgG (Immunoglobin G), Serum: 558 mg/dL — ABNORMAL LOW (ref 603–1613)
IgM (Immunoglobulin M), Srm: 19 mg/dL (ref 15–143)
Total Protein ELP: 6.4 g/dL (ref 6.0–8.5)

## 2020-09-29 ENCOUNTER — Encounter: Payer: Self-pay | Admitting: Hematology

## 2020-10-01 ENCOUNTER — Inpatient Hospital Stay: Payer: Medicare PPO | Attending: Hematology | Admitting: Hematology

## 2020-10-01 DIAGNOSIS — D649 Anemia, unspecified: Secondary | ICD-10-CM

## 2020-10-01 NOTE — Progress Notes (Signed)
HEMATOLOGY/ONCOLOGY CONSULTATION NOTE  Date of Service: 10/01/2020  Patient Care Team: [name], MD as PCP - General (Family Medicine) [name], MD as PCP - Cardiology (Cardiology) [name], MD as PCP - Electrophysiology (Cardiology)  CHIEF COMPLAINTS/PURPOSE OF CONSULTATION:  Abnormal CBC/ Hx of Follicular Lymphoma  HISTORY OF PRESENTING ILLNESS:   Jacob Warner is a wonderful 86 y.o. male who has been referred to [place] by Dr. [name] for evaluation and management of abnormal CBC/history of follicular lymphoma. Pt is accompanied today by his son. The pt reports that he is doing well overall.   The pt reports that he was diagnosed with and treated for Follicular Lymphoma in 8756. He completed 5 months of Ritxuan, Cytoxan, and Prednisone. He has since been in remission.   A few months ago pt began having pain in his right-sided lower back. He found relief from this discomfort after he increased his water intake. The pain as since returned and remains despite the pt continuing to drink an adequate amount of water. Pt was referred to a Nephrologist after discussing his back pain with [name], his PCP. Pt has previously had kidney stones and has been told that he has BPH, but this has not disturbed his urinary habits. He denies seeing any blood in his urine, but was found to have hematuria on recent labs with [name]. Pt had an abdominal US in September, which showed a benign left kidney cyst. Pt has chronic back pain for which he receives steroid injections.    He has no history of thyroid disorders. Pt takes a baby Aspirin for his Afib and Pantoprazole daily. Pt has a history of dysphagia and has required esophageal dilation. He has lost nearly 40 lbs in the last six months. Pt has been eating less the last few months due to a decreased appetite and a change in routine. He also reports smaller stools in the last 6-8 months. His stools used to be round, but are now much  thinner. This is completely new and persistent.   Of note since the patient's last visit, pt has had [place] Abd ([id number]) completed on 09/04/2020 with results revealing "Stable 2.8 x 3.5 x 2.3 cm left superior pole simple renal cyst. Otherwise unremarkable upper abdominal ultrasound with the pancreas not visualized."  Most recent lab results (08/21/2020) of CBC w/diff and CMP is as follows: all values are WNL except for RBC at 3.05, Hgb at 9.9, HCT at 29.3, MCV at 96.0, Neutro Rel at 73.8, Lymphs Rel at 10.3, Lymphs Abs at 0.90K, Glucose at 103, BUN at 32, Creatinine at 1.46, GFR Est Non Af Am at 45, ALP at 140.  On review of systems, pt reports dysphagia, weight loss, low appetite, back pain, change in appearance of stools, neuropathy and denies nausea, abdominal pain, hematuria, fevers, chills, night sweats, new lumps/bumps, bloody stools, abdominal pain, testicular pain/swelling and any other symptoms.   On PMHx the pt reports Follicular Lymphoma, Cervical Spine Surgery, Spinal Cord Stimulator Implant, Afib, Pelvic fracture, Reflux, BPH, Kidney Stones.   INTERVAL HISTORY:  I connected with  Jacob Warner on 10/01/20 by telephone and verified that I am speaking with the correct person using two identifiers.   I discussed the limitations of evaluation and management by telemedicine. The patient expressed understanding and agreed to proceed. Patient's location: Home Provider's location: [facility] [place] is a wonderful 86 y.o. male who is here for evaluation  and management of abnormal CBC/history of follicular lymphoma. The patient's last visit with [place] was on 09/17/2020. The pt reports that he is doing well overall.  The pt reports no acute new symptoms.  Lab results (09/17/20) of CBC w/diff and CMP is as follow3s: all values are WNL except for RBC at 3.52, Hgb at 11.3, HCT at 34.8, BUN at 31, Creatinine at 1.43, GFR Est Non Af Am at 42. 09/17/2020 MMP shows all values  are WNL except for IgG at 558. 09/17/2020 Folate Hemolysate at 366.0, HCT at 34.1, Folate RBC at 1073 09/17/2020 Iron Panel is as follows: Iron at 61, TIBC at 279, Sat Ratios at 22, UIBC at 218 09/17/2020 K/L light chains is as follows: Kappa free light chain at 15.8, Lamda free light chains at 10.3, K/L light chain ratio at 1.53 09/17/2020 Vitamin B12 at 751 09/17/2020 Ferritin at 157   On review of systems, pt reports no fevers. No bleeding.  MEDICAL HISTORY:  Past Medical History:  Diagnosis Date  . A-fib ([place])   . Alopecia   . Anemia   . Bradycardia   . Cancer ([place])   . Cherry angioma   . Echocardiogram    Echo 11/19: Mild concentric LVH, EF 50-55, normal wall motion, grade 1 diastolic dysfunction, mild AI, MAC, mild MR, normal RVSF, moderate TR, PASP 36  . GERD (gastroesophageal reflux disease)   . Glaucoma   . Hypertension   . Nuclear stress test    Nuclear stress test 11/19: not gated, inf and apical defect c/w small scar and/or soft tissue attenuation  . Pelvic fracture ([place])   . Right BBB/left ant fasc block   . Sick sinus syndrome ([place])   . TIA (transient ischemic attack)   . Xeroderma     SURGICAL HISTORY: Past Surgical History:  Procedure Laterality Date  . APPENDECTOMY    . CERVICAL SPINE SURGERY    . EXPLORATORY LAPAROTOMY    . LITHOTRIPSY    . PACEMAKER PLACEMENT    . PPM GENERATOR CHANGEOUT N/A 05/10/2020   Procedure: PPM GENERATOR CHANGEOUT;  Surgeon: [name], MD;  Location: [facility];  Service: Cardiovascular;  Laterality: N/A;  . SPINAL CORD STIMULATOR IMPLANT Bilateral 2018   [name], MD PhD, [facility]    SOCIAL HISTORY: Social History   Socioeconomic History  . Marital status: Married    Spouse name: Not on file  . Number of children: Not on file  . Years of education: Not on file  . Highest education level: Not on file  Occupational History  . Not on file  Tobacco Use  . Smoking  status: Never Smoker  . Smokeless tobacco: Never Used  Vaping Use  . Vaping Use: Never used  Substance and Sexual Activity  . Alcohol use: No    Alcohol/week: 0.0 standard drinks  . Drug use: No  . Sexual activity: Not on file  Other Topics Concern  . Not on file  Social History Narrative  . Not on file   Social Determinants of Health   Financial Resource Strain:   . Difficulty of Paying Living Expenses: Not on file  Food Insecurity:   . Worried About Charity fundraiser in the Last Year: Not on file  . Ran Out of Food in the Last Year: Not on file  Transportation Needs:   . Lack of Transportation (Medical): Not on file  . Lack of Transportation (Non-Medical): Not on file  Physical Activity:   . Days of Exercise per Week: Not on file  . Minutes of Exercise per Session: Not on file  Stress:   . Feeling of Stress : Not on file  Social Connections:   . Frequency of Communication with Friends and Family: Not on file  . Frequency of Social Gatherings with Friends and Family: Not on file  . Attends Religious Services: Not on file  . Active Member of Clubs or Organizations: Not on file  . Attends Archivist Meetings: Not on file  . Marital Status: Not on file  Intimate Partner Violence:   . Fear of Current or Ex-Partner: Not on file  . Emotionally Abused: Not on file  . Physically Abused: Not on file  . Sexually Abused: Not on file    FAMILY HISTORY: Family History  Problem Relation Age of Onset  . Brain cancer Sister     ALLERGIES:  is allergic to sulfa antibiotics and sulfacetamide sodium.  MEDICATIONS:  Current Outpatient Medications  Medication Sig Dispense Refill  . acetaminophen (TYLENOL) 500 MG tablet Take 500-1,000 mg by mouth See admin instructions. 500 mg twice daily, 1000 mg at bedtime    . aspirin 81 MG tablet Take 81 mg by mouth daily.    Marland Kitchen azelastine (ASTELIN) 0.1 % nasal spray Place 1 spray into both nostrils 2 (two) times daily as needed for  rhinitis.     Marland Kitchen denosumab (PROLIA) 60 MG/ML SOSY injection Inject 60 mg into the skin every 6 (six) months.    . dutasteride (AVODART) 0.5 MG capsule Take 0.5 mg by mouth daily.    . furosemide (LASIX) 40 MG tablet Take 40 mg by mouth daily.     Marland Kitchen gabapentin (NEURONTIN) 300 MG capsule Take 300 mg by mouth at bedtime.     Marland Kitchen HYDROcodone-acetaminophen (NORCO/VICODIN) 5-325 MG tablet     . hydroxypropyl methylcellulose / hypromellose (ISOPTO TEARS / GONIOVISC) 2.5 % ophthalmic solution Place 1 drop into both eyes as needed for dry eyes.    Marland Kitchen ketoconazole (NIZORAL) 2 % shampoo Apply 1 application topically 2 (two) times a week.     . lactulose (CHRONULAC) 10 GM/15ML solution Take 10 g by mouth daily.   0  . latanoprost (XALATAN) 0.005 % ophthalmic solution Place 1 drop into both eyes at bedtime.   11  . Magnesium Oxide 400 (240 Mg) MG TABS Take 1 tablet by mouth 2 (two) times daily.  7  . meloxicam (MOBIC) 15 MG tablet Take 15 mg by mouth daily.    . nitroGLYCERIN (NITROSTAT) 0.4 MG SL tablet Place 1 tablet (0.4 mg total) under the tongue every 5 (five) minutes as needed for chest pain. 25 tablet 5  . pantoprazole (PROTONIX) 40 MG tablet Take 1 tablet by mouth daily.    . RESTASIS 0.05 % ophthalmic emulsion Place 1 drop into both eyes daily.   11  . ROCKLATAN 0.02-0.005 % SOLN     . topiramate (TOPAMAX) 25 MG tablet     . traZODone (DESYREL) 50 MG tablet Take 50 mg by mouth at bedtime.   11  . vitamin B-12 (CYANOCOBALAMIN) 1000 MCG tablet Take 1,000 mcg by mouth daily.     . White Petrolatum-Mineral Oil (EYE LUBRICANT) OINT Apply 1 strip to eye at bedtime.     Current Facility-Administered Medications  Medication Dose Route Frequency Provider Last Rate Last Admin  . methylPREDNISolone acetate (DEPO-MEDROL) injection 80 mg  80 mg Other Once Magnus Sinning, MD  REVIEW OF SYSTEMS:   A 10+ POINT REVIEW OF SYSTEMS WAS OBTAINED including neurology, dermatology, psychiatry, cardiac,  respiratory, lymph, extremities, GI, GU, Musculoskeletal, constitutional, breasts, reproductive, HEENT.  All pertinent positives are noted in the HPI.  All others are negative.   PHYSICAL EXAMINATION: ECOG PERFORMANCE STATUS: 1 - Symptomatic but completely ambulatory  Telehealth visit  LABORATORY DATA:  I have reviewed the data as listed  . CBC Latest Ref Rng & Units 09/17/2020 09/17/2020 04/20/2020  WBC 4.0 - 10.5 K/uL 7.4 - 5.5  Hemoglobin 13.0 - 17.0 g/dL 11.3(L) - 11.4(L)  Hematocrit 37.5 - 51.0 % 34.1(L) 34.8(L) 34.2(L)  Platelets 150 - 400 K/uL 204 - 221   . CBC    Component Value Date/Time   WBC 7.4 09/17/2020 1204   RBC 3.52 (L) 09/17/2020 1204   HGB 11.3 (L) 09/17/2020 1204   HGB 11.4 (L) 04/20/2020 1106   HGB 12.2 (L) 07/08/2017 1336   HCT 34.1 (L) 09/17/2020 1205   HCT 34.8 (L) 09/17/2020 1204   HCT 35.6 (L) 07/08/2017 1336   PLT 204 09/17/2020 1204   PLT 221 04/20/2020 1106   MCV 98.9 09/17/2020 1204   MCV 97 04/20/2020 1106   MCV 96.2 07/08/2017 1336   MCH 32.1 09/17/2020 1204   MCHC 32.5 09/17/2020 1204   RDW 13.4 09/17/2020 1204   RDW 13.2 04/20/2020 1106   RDW 13.9 07/08/2017 1336   LYMPHSABS 1.1 09/17/2020 1204   LYMPHSABS 1.3 04/20/2020 1106   LYMPHSABS 0.8 (L) 07/08/2017 1336   MONOABS 0.6 09/17/2020 1204   MONOABS 0.5 07/08/2017 1336   EOSABS 0.2 09/17/2020 1204   EOSABS 0.2 04/20/2020 1106   BASOSABS 0.0 09/17/2020 1204   BASOSABS 0.0 04/20/2020 1106   BASOSABS 0.0 07/08/2017 1336    . CMP Latest Ref Rng & Units 09/17/2020 04/20/2020 02/08/2020  Glucose 70 - 99 mg/dL 89 82 92  BUN 8 - 23 mg/dL 31(H) 27 25  Creatinine 0.61 - 1.24 mg/dL 1.43(H) 1.51(H) 1.64(H)  Sodium 135 - 145 mmol/L 142 139 141  Potassium 3.5 - 5.1 mmol/L 4.2 4.5 4.7  Chloride 98 - 111 mmol/L 103 105 103  CO2 22 - 32 mmol/L 31 30(H) 24  Calcium 8.9 - 10.3 mg/dL 9.4 9.4 8.8  Total Protein 6.5 - 8.1 g/dL 6.8 - 5.9(L)  Total Bilirubin 0.3 - 1.2 mg/dL 0.5 - 0.4  Alkaline Phos  38 - 126 U/L 96 - 57  AST 15 - 41 U/L 22 - 24  ALT 0 - 44 U/L 17 - 14     RADIOGRAPHIC STUDIES: I have personally reviewed the radiological images as listed and agreed with the findings in the report. US Abdomen Complete  Result Date: 09/04/2020 CLINICAL DATA:  Hematuria, rule out mass. EXAM: ABDOMEN ULTRASOUND COMPLETE COMPARISON:  CT abdomen pelvis 06/26/2016. FINDINGS: Gallbladder: No gallstones or wall thickening visualized. No sonographic Murphy sign noted by sonographer. Common bile duct: Diameter: 3.9 mm. Liver: No focal lesion identified. Within normal limits in parenchymal echogenicity. Portal vein is patent on color Doppler imaging with normal direction of blood flow towards the liver. IVC: No abnormality visualized. Pancreas: Not visualized due to bowel gas. Spleen: Size and appearance within normal limits. Right Kidney: Length: 11.1 cm. Echogenicity within normal limits. No mass or hydronephrosis visualized. Left Kidney: Length: 10.2 cm. Echogenicity within normal limits. There is a grossly unchanged (compared to CT abdomen pelvis 06/26/2016 close) 2.8 x 3.5 x 2.3 cm simple renal cyst in the superior pole of  the left kidney. No hydronephrosis visualized. Abdominal aorta: No aneurysm visualized. Other findings: None. IMPRESSION: Stable 2.8 x 3.5 x 2.3 cm left superior pole simple renal cyst. Otherwise unremarkable upper abdominal ultrasound with the pancreas not visualized. Electronically Signed   By: Iven Finn M.D.   On: 09/04/2020 14:31   XR C-ARM NO REPORT  Result Date: 09/10/2020 Please see Notes tab for imaging impression.   ASSESSMENT & PLAN:   86 yo with   1) Mild macrocytic anemia 2) h/o follicular lymphoma in 0737 rx with R-[facility]P PLAN: Labs today   FOLLOW UP: -discussed results with patient. -hgb stable/improved from 9.9 Lab results (09/17/20) of CBC w/diff and CMP is as follow3s: all values are WNL except for RBC at 3.52, Hgb at 11.3, HCT at 34.8, BUN at 31,  Creatinine at 1.43, GFR Est Non Af Am at 42. 09/17/2020 MMP shows all values are WNL except for IgG at 558. 09/17/2020 Folate Hemolysate at 366.0, HCT at 34.1, Folate RBC at 1073 09/17/2020 Iron Panel is as follows: Iron at 61, TIBC at 279, Sat Ratios at 22, UIBC at 218 09/17/2020 K/L light chains is as follows: Kappa free light chain at 15.8, Lamda free light chains at 10.3, K/L light chain ratio at 1.53 09/17/2020 Vitamin B12 at 751 09/17/2020 Ferritin at  -no overt evidence of FL symptomatic progression at this time, - will hold on aggressive w/u at this time with BM Bx -RTC with Dr Irene Limbo with labs in 6 months   The total time spent in the appt was 20 minutes and more than 50% was on counseling and direct patient cares.  All of the patient's questions were answered with apparent satisfaction. The patient knows to call the clinic with any problems, questions or concerns.    Sullivan Lone MD [place] AAHIVMS [facility] Hematology/Oncology Physician [facility]  (Office):       [phone] (Work cell):  [phone] (Fax):           [phone]  10/01/2020 8:01 AM  I, Yevette Edwards, am acting as a scribe for Dr. Sullivan Lone.   .I have reviewed the above documentation for accuracy and completeness, and I agree with the above. Brunetta Genera MD

## 2020-10-02 DIAGNOSIS — R519 Headache, unspecified: Secondary | ICD-10-CM | POA: Diagnosis not present

## 2020-10-02 DIAGNOSIS — Z8572 Personal history of non-Hodgkin lymphomas: Secondary | ICD-10-CM | POA: Diagnosis not present

## 2020-10-08 ENCOUNTER — Telehealth: Payer: Self-pay | Admitting: Hematology

## 2020-10-08 NOTE — Telephone Encounter (Signed)
Scheduled appt per 10/11 sch msg - mailed reminder letter with appt date and time

## 2020-10-16 ENCOUNTER — Other Ambulatory Visit: Payer: Self-pay

## 2020-10-16 ENCOUNTER — Ambulatory Visit: Payer: Medicare PPO | Admitting: Physical Medicine and Rehabilitation

## 2020-10-16 ENCOUNTER — Ambulatory Visit: Payer: Self-pay

## 2020-10-16 ENCOUNTER — Encounter: Payer: Self-pay | Admitting: Physical Medicine and Rehabilitation

## 2020-10-16 VITALS — BP 130/44 | HR 50

## 2020-10-16 DIAGNOSIS — M47816 Spondylosis without myelopathy or radiculopathy, lumbar region: Secondary | ICD-10-CM

## 2020-10-16 MED ORDER — METHYLPREDNISOLONE ACETATE 80 MG/ML IJ SUSP
80.0000 mg | Freq: Once | INTRAMUSCULAR | Status: AC
  Administered 2020-10-16: 80 mg

## 2020-10-16 NOTE — Progress Notes (Signed)
Pt state lower back pain that travels to both hips and up his back. Pt state walking and standing for long period of time makes the pain worse. Pt state he use heating and ice packs to ease the pain.  Numeric Pain Rating Scale and Functional Assessment Average Pain 8   In the last MONTH (on 0-10 scale) has pain interfered with the following?  1. General activity like being  able to carry out your everyday physical activities such as walking, climbing stairs, carrying groceries, or moving a chair?  Rating(7)   +Driver, -BT, -Dye Allergies.

## 2020-10-17 ENCOUNTER — Telehealth: Payer: Self-pay | Admitting: Physical Medicine and Rehabilitation

## 2020-10-17 NOTE — Telephone Encounter (Signed)
Left message #1

## 2020-10-17 NOTE — Telephone Encounter (Signed)
Pt called stating he missed a call to r/s an appt and states he is free now if Loma Sousa will try him again  [phone]

## 2020-10-17 NOTE — Telephone Encounter (Signed)
Pts son called on his behalf wanting to reschedule the appt that's currently set for 10/22/20  [phone]

## 2020-10-17 NOTE — Telephone Encounter (Signed)
Rescheduled

## 2020-10-22 ENCOUNTER — Encounter: Payer: Medicare PPO | Admitting: Physical Medicine and Rehabilitation

## 2020-10-23 ENCOUNTER — Telehealth: Payer: Self-pay | Admitting: Physical Medicine and Rehabilitation

## 2020-10-23 NOTE — Telephone Encounter (Signed)
Pts son Jacob Warner called asking for a CB in regards to the pts pain levels and possibly needing an appt  [phone]

## 2020-10-23 NOTE — Telephone Encounter (Signed)
Patient had to reschedule his RFA to 11/4. His son states that the patient is still having a lot of pain. The patient's wife passed away last 2023-03-28, and the family will be traveling for the funeral. The patient's son is asking if there is a medication he can take to help with the pain and if there are any recommendation for anything else he can try to help relieve the pain with traveling. Please advise.

## 2020-10-24 ENCOUNTER — Other Ambulatory Visit: Payer: Self-pay | Admitting: Physical Medicine and Rehabilitation

## 2020-10-24 MED ORDER — HYDROCODONE-ACETAMINOPHEN 5-325 MG PO TABS: 1.0000 | ORAL_TABLET | Freq: Four times a day (QID) | ORAL | 0 refills | Status: DC | PRN

## 2020-10-24 NOTE — Telephone Encounter (Signed)
Left message to advise as directed.

## 2020-10-24 NOTE — Telephone Encounter (Signed)
Rx for norco sent in, prior rx in Sept by PCP. RFA may not work yet which is normal. If pain continues for more than a few weeks may need f/u Nitka or CT scan

## 2020-10-24 NOTE — Progress Notes (Signed)
rx for short term Norco. Prior rx by PCP in September. Patient's 12 month [facility] history was reviewed and no inappropriate medication refills noted.

## 2020-11-01 ENCOUNTER — Ambulatory Visit: Payer: Medicare PPO | Admitting: Physical Medicine and Rehabilitation

## 2020-11-01 ENCOUNTER — Encounter: Payer: Self-pay | Admitting: Physical Medicine and Rehabilitation

## 2020-11-01 ENCOUNTER — Ambulatory Visit: Payer: Self-pay

## 2020-11-01 ENCOUNTER — Other Ambulatory Visit: Payer: Self-pay

## 2020-11-01 VITALS — BP 116/61 | HR 79

## 2020-11-01 DIAGNOSIS — M47816 Spondylosis without myelopathy or radiculopathy, lumbar region: Secondary | ICD-10-CM | POA: Diagnosis not present

## 2020-11-01 MED ORDER — METHYLPREDNISOLONE ACETATE 80 MG/ML IJ SUSP
80.0000 mg | Freq: Once | INTRAMUSCULAR | Status: AC
  Administered 2020-11-01: 80 mg

## 2020-11-01 NOTE — Progress Notes (Signed)
Pt state lower back pain that travels down to both legs. Pt state standing for a long period of time makes the pain worse. Pt state he takes pain meds to help ease the pain.  Numeric Pain Rating Scale and Functional Assessment Average Pain 6   In the last MONTH (on 0-10 scale) has pain interfered with the following?  1. General activity like being  able to carry out your everyday physical activities such as walking, climbing stairs, carrying groceries, or moving a chair?  Rating(10)   +Driver, -BT, -Dye Allergies.

## 2020-11-12 ENCOUNTER — Ambulatory Visit (INDEPENDENT_AMBULATORY_CARE_PROVIDER_SITE_OTHER): Payer: Medicare PPO

## 2020-11-12 DIAGNOSIS — I495 Sick sinus syndrome: Secondary | ICD-10-CM | POA: Diagnosis not present

## 2020-11-12 LAB — CUP PACEART REMOTE DEVICE CHECK
Battery Remaining Longevity: 102 mo
Battery Remaining Percentage: 100 %
Brady Statistic RA Percent Paced: 47 %
Brady Statistic RV Percent Paced: 28 %
Date Time Interrogation Session: 20211115093900
Implantable Lead Implant Date: 20130926
Implantable Lead Implant Date: 20130926
Implantable Lead Location: 753859
Implantable Lead Location: 753860
Implantable Lead Model: 4456
Implantable Lead Model: 4476
Implantable Lead Serial Number: 473023
Implantable Lead Serial Number: 523746
Implantable Pulse Generator Implant Date: 20210513
Lead Channel Impedance Value: 331 Ohm
Lead Channel Impedance Value: 400 Ohm
Lead Channel Pacing Threshold Amplitude: 0.5 V
Lead Channel Pacing Threshold Pulse Width: 0.4 ms
Lead Channel Setting Pacing Amplitude: 2 V
Lead Channel Setting Pacing Amplitude: 2.5 V
Lead Channel Setting Pacing Pulse Width: 0.4 ms
Lead Channel Setting Sensing Sensitivity: 2.5 mV
Pulse Gen Serial Number: 528325

## 2020-11-14 NOTE — Progress Notes (Signed)
Remote pacemaker transmission.   

## 2020-11-25 NOTE — Procedures (Signed)
Lumbar Facet Joint Nerve Denervation  Patient: Jacob Warner      Date of Birth: 1924-10-09 MRN: [id number] PCP: [name], MD      Visit Date: 10/16/2020   Universal Protocol:    Date/Time: 11/28/211:14 PM  Consent Given By: the patient  Position: PRONE  Additional Comments: Vital signs were monitored before and after the procedure. Patient was prepped and draped in the usual sterile fashion. The correct patient, procedure, and site was verified.   Injection Procedure Details:   Procedure diagnoses:  1. Spondylosis without myelopathy or radiculopathy, lumbar region      Meds Administered:  Meds ordered this encounter  Medications  . methylPREDNISolone acetate (DEPO-MEDROL) injection 80 mg     Laterality: Left  Location/Site:  L4-L5 and L5-S1  Needle: 18 ga.,  11mm active tip RF Cannula  Needle Placement: Along juncture of superior articular process and transverse pocess  Findings:  -Comments:  Procedure Details: For each desired target nerve, the corresponding transverse process (sacral ala for the L5 dorsal rami) was identified and the fluoroscope was positioned to square off the endplates of the corresponding vertebral body to achieve a true AP midline view.  The beam was then obliqued 15 to 20 degrees and caudally tilted 15 to 20 degrees to line up a trajectory along the target nerves. The skin over the target of the junction of superior articulating process and transverse process (sacral ala for the L5 dorsal rami) was infiltrated with 4ml of 1% Lidocaine without Epinephrine.  The 18 gauge 19mm active tip outer cannula was advanced in trajectory view to the target.  This procedure was repeated for each target nerve.  Then, for all levels, the outer cannula placement was fine-tuned and the position was then confirmed with bi-planar imaging.    Test stimulation was done both at sensory and motor levels to ensure there was no radicular stimulation. The target  tissues were then infiltrated with 1 ml of 1% Lidocaine without Epinephrine. Subsequently, a percutaneous neurotomy was carried out for 90 seconds at 80 degrees Celsius.  After the completion of the lesion, 1 ml of injectate was delivered. It was then repeated for each facet joint nerve mentioned above. Appropriate radiographs were obtained to verify the probe placement during the neurotomy.   Additional Comments:  The patient tolerated the procedure well Dressing: 2 x 2 sterile gauze and Band-Aid    Post-procedure details: Patient was observed during the procedure. Post-procedure instructions were reviewed.  Patient left the clinic in stable condition.

## 2020-11-25 NOTE — Progress Notes (Addendum)
Jacob Warner - 86 y.o. male MRN [id number]  Date of birth: 1924-09-06  Office Visit Note: Visit Date: 10/16/2020 PCP: [name], MD Referred by: [name], MD  Subjective: Chief Complaint  Patient presents with  . Lower Back - Pain  . Right Hip - Pain  . Left Hip - Pain  . Middle Back - Pain   HPI:  Jacob Warner is a 86 y.o. male who comes in today for planned repeat radiofrequency ablation of the Left L4-L5 and L5-S1  Lumbar facet joints. This would be ablation of the corresponding medial branches and/or dorsal rami.  Patient has had double diagnostic blocks with more than 70% relief.  Subsequent ablation gave them more than 6 months of over 60% relief.  They have had chronic back pain for quite some time, more than 3 months, which has been an ongoing situation with recalcitrant axial back pain.  They have no radicular pain.  Their axial pain is worse with standing and ambulating and on exam today with facet loading.  They have had physical therapy as well as home exercise program.  The imaging noted in the chart below indicated facet pathology. Accordingly they meet all the criteria and qualification for for radiofrequency ablation and we are going to complete this today hopefully for more longer term relief as part of comprehensive management program.  Patient's lumbar spine history also problematic for stenosis which has been managed with spinal cord stimulator.  He has had issues lately with the stimulator because of defective or I guess I should say broken charging device.  There has been some communication difficulties with the patient and the representatives in the company that provides replacement.  We are trying to work this out with the son today as well.  I did text Tillie Rung from [employer] and hopefully we can resolve some of this.  He is done well with using the stimulator as well as intermittent ablation for the lumbar spine and hip pain.  Also unfortunately his wife  is in hospice care at this point and really seems like her time is very short at this point.  We will try to get him some relief through the situation.  Depending on today's procedure relief would look at the right side as well.  ROS Otherwise per HPI.  Assessment & Plan: Visit Diagnoses:  1. Spondylosis without myelopathy or radiculopathy, lumbar region     Plan: No additional findings.   Meds & Orders:  Meds ordered this encounter  Medications  . methylPREDNISolone acetate (DEPO-MEDROL) injection 80 mg    Orders Placed This Encounter  Procedures  . Radiofrequency,Lumbar  . XR C-ARM NO REPORT    Follow-up: Return if symptoms worsen or fail to improve.   Procedures: No procedures performed  Lumbar Facet Joint Nerve Denervation  Patient: Jacob Warner      Date of Birth: 03-May-1924 MRN: [id number] PCP: [name], MD      Visit Date: 10/16/2020   Universal Protocol:    Date/Time: 11/28/211:14 PM  Consent Given By: the patient  Position: PRONE  Additional Comments: Vital signs were monitored before and after the procedure. Patient was prepped and draped in the usual sterile fashion. The correct patient, procedure, and site was verified.   Injection Procedure Details:   Procedure diagnoses:  1. Spondylosis without myelopathy or radiculopathy, lumbar region      Meds Administered:  Meds ordered this encounter  Medications  . methylPREDNISolone acetate (DEPO-MEDROL) injection 80  mg     Laterality: Left  Location/Site:  L4-L5 and L5-S1  Needle: 18 ga.,  75mm active tip RF Cannula  Needle Placement: Along juncture of superior articular process and transverse pocess  Findings:  -Comments:  Procedure Details: For each desired target nerve, the corresponding transverse process (sacral ala for the L5 dorsal rami) was identified and the fluoroscope was positioned to square off the endplates of the corresponding vertebral body to achieve a true AP midline  view.  The beam was then obliqued 15 to 20 degrees and caudally tilted 15 to 20 degrees to line up a trajectory along the target nerves. The skin over the target of the junction of superior articulating process and transverse process (sacral ala for the L5 dorsal rami) was infiltrated with 60ml of 1% Lidocaine without Epinephrine.  The 18 gauge 36mm active tip outer cannula was advanced in trajectory view to the target.  This procedure was repeated for each target nerve.  Then, for all levels, the outer cannula placement was fine-tuned and the position was then confirmed with bi-planar imaging.    Test stimulation was done both at sensory and motor levels to ensure there was no radicular stimulation. The target tissues were then infiltrated with 1 ml of 1% Lidocaine without Epinephrine. Subsequently, a percutaneous neurotomy was carried out for 90 seconds at 80 degrees Celsius.  After the completion of the lesion, 1 ml of injectate was delivered. It was then repeated for each facet joint nerve mentioned above. Appropriate radiographs were obtained to verify the probe placement during the neurotomy.   Additional Comments:  The patient tolerated the procedure well Dressing: 2 x 2 sterile gauze and Band-Aid    Post-procedure details: Patient was observed during the procedure. Post-procedure instructions were reviewed.  Patient left the clinic in stable condition.       Clinical History: CT PELVIS WITHOUT CONTRAST  TECHNIQUE: Multidetector CT imaging of the pelvis was performed following the standard protocol without intravenous contrast.  COMPARISON:  CT of the abdomen and pelvis on 06/26/2016, plain films 07/25/2018  FINDINGS: Urinary Tract: Normal appearance of the urinary bladder and distal ureters.  Bowel: Nonobstructed bowel-gas pattern. There are numerous radiopaque tablets within the cecum and descending colon. I count approximately 25 tablets in the region of the cecum.  These are primarily uniform and size, measuring approximately 1 centimeter in diameter. Findings are consistent with stasis but also raise the possibility of numerous tablets ingested at a single time.  Vascular/Lymphatic: There is dense atherosclerotic calcification of the abdominal aorta and femoral arteries. No aneurysm. No retroperitoneal or mesenteric adenopathy.  Reproductive:  Prostatic calcifications are present.  Other: There is partially imaged soft tissue in the region of the superior mesenteric artery and vein, similar in appearance to the previous exams.  Musculoskeletal: Remote fractures of the superior and inferior pubic rami bilaterally. Remote LEFT sacral fracture. No evidence for acute fracture or subluxation. Nondisplaced fractures cannot be excluded. Small amount of stranding in the subcutaneous region of the LEFT buttock.  IMPRESSION: 1. No acute, displaced fracture. Depending on the clinical suspicion, MRI may be helpful in detecting an nondisplaced fracture. 2. Remote fractures of the LEFT sacrum, superior and inferior pubic rami bilaterally. 3. Numerous radiopaque tablets in the proximal colon. Although the findings could be related to stasis, overdose needs to be considered. 4. Persistent soft tissue in the region the superior mesenteric artery and vein. The appearance is nonspecific and stable. 5. These results were called by telephone  at the time of interpretation on 07/29/2018 at 11:37 am to Dr. Basil Dess , who verbally acknowledged these results.   Electronically Signed   By: Nolon Nations M.D.   On: 07/29/2018 11:37     Objective:  VS:  HT:    WT:   BMI:     BP:(!) 130/44  HR:(!) 50bpm  TEMP: ( )  RESP:  Physical Exam Constitutional:      General: He is not in acute distress.    Appearance: Normal appearance. He is not ill-appearing.  HENT:     Head: Normocephalic and atraumatic.     Right Ear: External ear normal.      Left Ear: External ear normal.  Eyes:     Extraocular Movements: Extraocular movements intact.  Cardiovascular:     Rate and Rhythm: Normal rate.     Pulses: Normal pulses.  Abdominal:     General: There is no distension.     Palpations: Abdomen is soft.  Musculoskeletal:        General: No tenderness or signs of injury.     Right lower leg: No edema.     Left lower leg: No edema.     Comments: Patient has good distal strength without clonus.Patient somewhat slow to rise from a seated position to full extension.  There is concordant low back pain with facet loading and lumbar spine extension rotation.  There are no definitive trigger points but the patient is somewhat tender across the lower back and PSIS.  There is no pain with hip rotation.   Skin:    Findings: No erythema or rash.  Neurological:     General: No focal deficit present.     Mental Status: He is alert and oriented to person, place, and time.     Sensory: No sensory deficit.     Motor: No weakness or abnormal muscle tone.     Coordination: Coordination normal.  Psychiatric:        Mood and Affect: Mood normal.        Behavior: Behavior normal.      Imaging: No results found.

## 2020-11-27 ENCOUNTER — Other Ambulatory Visit: Payer: Self-pay | Admitting: Physical Medicine and Rehabilitation

## 2020-11-27 ENCOUNTER — Telehealth: Payer: Self-pay | Admitting: Physical Medicine and Rehabilitation

## 2020-11-27 DIAGNOSIS — M5416 Radiculopathy, lumbar region: Secondary | ICD-10-CM | POA: Diagnosis not present

## 2020-11-27 DIAGNOSIS — G894 Chronic pain syndrome: Secondary | ICD-10-CM | POA: Diagnosis not present

## 2020-11-27 MED ORDER — HYDROCODONE-ACETAMINOPHEN 5-325 MG PO TABS
1.0000 | ORAL_TABLET | Freq: Four times a day (QID) | ORAL | 0 refills | Status: DC | PRN
Start: 2020-11-27 — End: 2020-12-02

## 2020-11-27 NOTE — Telephone Encounter (Signed)
Please advise on hydrocodone. I will call patient's son re spinal cord stimulator.

## 2020-11-27 NOTE — Progress Notes (Signed)
5 days of norco rx, will need to be seen if continues or can see PCP. [employer] notified about stimulator

## 2020-11-27 NOTE — Telephone Encounter (Signed)
Pt son would like to speak with someone regarding a piece of equipment that was ordered for his father.   Pt would also like some more hydrocodone.

## 2020-11-27 NOTE — Telephone Encounter (Signed)
Please have Tillie Rung call them and update [place] on issues with getting this to him. Also did send in short amount of Norco

## 2020-11-28 NOTE — Telephone Encounter (Signed)
Called patient's son to advise on rx and to confirm that he spoke to [place] from [employer]. He states that the new charger was delivered this morning.

## 2020-12-04 DIAGNOSIS — H401131 Primary open-angle glaucoma, bilateral, mild stage: Secondary | ICD-10-CM | POA: Diagnosis not present

## 2020-12-04 DIAGNOSIS — H04121 Dry eye syndrome of right lacrimal gland: Secondary | ICD-10-CM | POA: Diagnosis not present

## 2020-12-20 DIAGNOSIS — R300 Dysuria: Secondary | ICD-10-CM | POA: Diagnosis not present

## 2020-12-20 DIAGNOSIS — D649 Anemia, unspecified: Secondary | ICD-10-CM | POA: Diagnosis not present

## 2020-12-20 DIAGNOSIS — M549 Dorsalgia, unspecified: Secondary | ICD-10-CM | POA: Diagnosis not present

## 2020-12-20 DIAGNOSIS — R634 Abnormal weight loss: Secondary | ICD-10-CM | POA: Diagnosis not present

## 2020-12-25 ENCOUNTER — Telehealth: Payer: Self-pay | Admitting: Physical Medicine and Rehabilitation

## 2020-12-25 DIAGNOSIS — M47816 Spondylosis without myelopathy or radiculopathy, lumbar region: Secondary | ICD-10-CM

## 2020-12-25 NOTE — Telephone Encounter (Signed)
Please advise. Called pt son Loraine Leriche back and he informed me that his dad is in a lot of pain. He wanted to know if he could bring the pt in for a injection or could he have a refill on his pain meds Gabapentin. Pt had Left L4-5 and L5-S1 RFA on 11/01/20.

## 2020-12-25 NOTE — Telephone Encounter (Signed)
Patient's son Loraine Leriche called requesting pain medication. He states pt is barely able to walk.Please call pt son Loraine Leriche at [phone]. Please send pain medication to pharmacy on file.

## 2020-12-26 ENCOUNTER — Encounter: Payer: Self-pay | Admitting: Physical Medicine and Rehabilitation

## 2020-12-27 MED ORDER — GABAPENTIN 300 MG PO CAPS: 300.0000 mg | ORAL_CAPSULE | Freq: Every day | ORAL | 1 refills | Status: AC

## 2020-12-27 MED ORDER — GABAPENTIN 300 MG PO CAPS: 300.0000 mg | ORAL_CAPSULE | Freq: Every day | ORAL | 1 refills | Status: DC

## 2020-12-27 NOTE — Procedures (Signed)
Lumbar Facet Joint Nerve Denervation  Patient: Jacob Warner      Date of Birth: 1924/03/29 MRN: [id number] PCP: Shirlean Mylar, MD      Visit Date: 11/01/2020   Universal Protocol:    Date/Time: 12/30/218:54 AM  Consent Given By: the patient  Position: PRONE  Additional Comments: Vital signs were monitored before and after the procedure. Patient was prepped and draped in the usual sterile fashion. The correct patient, procedure, and site was verified.   Injection Procedure Details:   Procedure diagnoses:  1. Spondylosis without myelopathy or radiculopathy, lumbar region      Meds Administered:  Meds ordered this encounter  Medications  . methylPREDNISolone acetate (DEPO-MEDROL) injection 80 mg     Laterality: Right  Location/Site:  L4-L5, L3 and L4 medial branches and L5-S1, L4 medial branch and L5 dorsal ramus  Needle: 18 ga.,  58mm active tip RF Cannula  Needle Placement: Along juncture of superior articular process and transverse pocess  Findings:  -Comments:  Procedure Details: For each desired target nerve, the corresponding transverse process (sacral ala for the L5 dorsal rami) was identified and the fluoroscope was positioned to square off the endplates of the corresponding vertebral body to achieve a true AP midline view.  The beam was then obliqued 15 to 20 degrees and caudally tilted 15 to 20 degrees to line up a trajectory along the target nerves. The skin over the target of the junction of superior articulating process and transverse process (sacral ala for the L5 dorsal rami) was infiltrated with 52ml of 1% Lidocaine without Epinephrine.  The 18 gauge 25mm active tip outer cannula was advanced in trajectory view to the target.  This procedure was repeated for each target nerve.  Then, for all levels, the outer cannula placement was fine-tuned and the position was then confirmed with bi-planar imaging.    Test stimulation was done both at sensory and motor  levels to ensure there was no radicular stimulation. The target tissues were then infiltrated with 1 ml of 1% Lidocaine without Epinephrine. Subsequently, a percutaneous neurotomy was carried out for 90 seconds at 80 degrees Celsius.  After the completion of the lesion, 1 ml of injectate was delivered. It was then repeated for each facet joint nerve mentioned above. Appropriate radiographs were obtained to verify the probe placement during the neurotomy.   Additional Comments:  The patient tolerated the procedure well Dressing: 2 x 2 sterile gauze and Band-Aid    Post-procedure details: Patient was observed during the procedure. Post-procedure instructions were reviewed.  Patient left the clinic in stable condition.

## 2020-12-27 NOTE — Progress Notes (Signed)
Jacob Warner - 86 y.o. male MRN [id number]  Date of birth: [birth year]  Office Visit Note: Visit Date: 11/01/2020 PCP: [name], MD Referred by: [name], MD  Subjective: Chief Complaint  Patient presents with  . Lower Back - Pain  . Left Leg - Pain  . Right Leg - Pain   HPI:  Jacob Warner is a 86 y.o. male who comes in today for planned repeat radiofrequency ablation of the Right L4-L5 and L5-S1  Lumbar facet joints. This would be ablation of the corresponding medial branches and/or dorsal rami.  Patient has had double diagnostic blocks with more than 70% relief.  Subsequent ablation gave them more than 6 months of over 60% relief.  They have had chronic back pain for quite some time, more than 3 months, which has been an ongoing situation with recalcitrant axial back pain.  They have no radicular pain.  Their axial pain is worse with standing and ambulating and on exam today with facet loading.  They have had physical therapy as well as home exercise program.  The imaging noted in the chart below indicated facet pathology. Accordingly they meet all the criteria and qualification for for radiofrequency ablation and we are going to complete this today hopefully for more longer term relief as part of comprehensive management program.  ROS Otherwise per HPI.  Assessment & Plan: Visit Diagnoses:    ICD-10-CM   1. Spondylosis without myelopathy or radiculopathy, lumbar region  M47.816 XR C-ARM NO REPORT    Radiofrequency,Lumbar    methylPREDNISolone acetate (DEPO-MEDROL) injection 80 mg    Plan: No additional findings.   Meds & Orders:  Meds ordered this encounter  Medications  . methylPREDNISolone acetate (DEPO-MEDROL) injection 80 mg    Orders Placed This Encounter  Procedures  . Radiofrequency,Lumbar  . XR C-ARM NO REPORT    Follow-up: Return if symptoms worsen or fail to improve.   Procedures: No procedures performed  Lumbar Facet Joint Nerve  Denervation  Patient: Jacob Warner      Date of Birth: Aug 11, 1924 MRN: [id number] PCP: [name], MD      Visit Date: 11/01/2020   Universal Protocol:    Date/Time: 12/30/218:54 AM  Consent Given By: the patient  Position: PRONE  Additional Comments: Vital signs were monitored before and after the procedure. Patient was prepped and draped in the usual sterile fashion. The correct patient, procedure, and site was verified.   Injection Procedure Details:   Procedure diagnoses:  1. Spondylosis without myelopathy or radiculopathy, lumbar region      Meds Administered:  Meds ordered this encounter  Medications  . methylPREDNISolone acetate (DEPO-MEDROL) injection 80 mg     Laterality: Right  Location/Site:  L4-L5, L3 and L4 medial branches and L5-S1, L4 medial branch and L5 dorsal ramus  Needle: 18 ga.,  31mm active tip RF Cannula  Needle Placement: Along juncture of superior articular process and transverse pocess  Findings:  -Comments:  Procedure Details: For each desired target nerve, the corresponding transverse process (sacral ala for the L5 dorsal rami) was identified and the fluoroscope was positioned to square off the endplates of the corresponding vertebral body to achieve a true AP midline view.  The beam was then obliqued 15 to 20 degrees and caudally tilted 15 to 20 degrees to line up a trajectory along the target nerves. The skin over the target of the junction of superior articulating process and transverse process (sacral ala for the L5 dorsal  rami) was infiltrated with 64ml of 1% Lidocaine without Epinephrine.  The 18 gauge 71mm active tip outer cannula was advanced in trajectory view to the target.  This procedure was repeated for each target nerve.  Then, for all levels, the outer cannula placement was fine-tuned and the position was then confirmed with bi-planar imaging.    Test stimulation was done both at sensory and motor levels to ensure there  was no radicular stimulation. The target tissues were then infiltrated with 1 ml of 1% Lidocaine without Epinephrine. Subsequently, a percutaneous neurotomy was carried out for 90 seconds at 80 degrees Celsius.  After the completion of the lesion, 1 ml of injectate was delivered. It was then repeated for each facet joint nerve mentioned above. Appropriate radiographs were obtained to verify the probe placement during the neurotomy.   Additional Comments:  The patient tolerated the procedure well Dressing: 2 x 2 sterile gauze and Band-Aid    Post-procedure details: Patient was observed during the procedure. Post-procedure instructions were reviewed.  Patient left the clinic in stable condition.       Clinical History: CT PELVIS WITHOUT CONTRAST  TECHNIQUE: Multidetector CT imaging of the pelvis was performed following the standard protocol without intravenous contrast.  COMPARISON:  CT of the abdomen and pelvis on 06/26/2016, plain films 07/25/2018  FINDINGS: Urinary Tract: Normal appearance of the urinary bladder and distal ureters.  Bowel: Nonobstructed bowel-gas pattern. There are numerous radiopaque tablets within the cecum and descending colon. I count approximately 25 tablets in the region of the cecum. These are primarily uniform and size, measuring approximately 1 centimeter in diameter. Findings are consistent with stasis but also raise the possibility of numerous tablets ingested at a single time.  Vascular/Lymphatic: There is dense atherosclerotic calcification of the abdominal aorta and femoral arteries. No aneurysm. No retroperitoneal or mesenteric adenopathy.  Reproductive:  Prostatic calcifications are present.  Other: There is partially imaged soft tissue in the region of the superior mesenteric artery and vein, similar in appearance to the previous exams.  Musculoskeletal: Remote fractures of the superior and inferior pubic rami bilaterally.  Remote LEFT sacral fracture. No evidence for acute fracture or subluxation. Nondisplaced fractures cannot be excluded. Small amount of stranding in the subcutaneous region of the LEFT buttock.  IMPRESSION: 1. No acute, displaced fracture. Depending on the clinical suspicion, MRI may be helpful in detecting an nondisplaced fracture. 2. Remote fractures of the LEFT sacrum, superior and inferior pubic rami bilaterally. 3. Numerous radiopaque tablets in the proximal colon. Although the findings could be related to stasis, overdose needs to be considered. 4. Persistent soft tissue in the region the superior mesenteric artery and vein. The appearance is nonspecific and stable. 5. These results were called by telephone at the time of interpretation on 07/29/2018 at 11:37 am to Dr. Basil Dess , who verbally acknowledged these results.   Electronically Signed   By: Nolon Nations M.D.   On: 07/29/2018 11:37     Objective:  VS:  HT:    WT:   BMI:     BP:116/61  HR:79bpm  TEMP: ( )  RESP:  Physical Exam Constitutional:      General: He is not in acute distress.    Appearance: Normal appearance. He is not ill-appearing.  HENT:     Head: Normocephalic and atraumatic.     Right Ear: External ear normal.     Left Ear: External ear normal.  Eyes:     Extraocular Movements: Extraocular movements  intact.  Cardiovascular:     Rate and Rhythm: Normal rate.     Pulses: Normal pulses.  Abdominal:     General: There is no distension.     Palpations: Abdomen is soft.  Musculoskeletal:        General: No tenderness or signs of injury.     Right lower leg: No edema.     Left lower leg: No edema.     Comments: Patient has good distal strength without clonus. Patient somewhat slow to rise from a seated position to full extension.  There is concordant low back pain with facet loading and lumbar spine extension rotation.  There are no definitive trigger points but the patient is somewhat  tender across the lower back and PSIS.  There is no pain with hip rotation.   Skin:    Findings: No erythema or rash.  Neurological:     General: No focal deficit present.     Mental Status: He is alert and oriented to person, place, and time.     Sensory: No sensory deficit.     Motor: No weakness or abnormal muscle tone.     Coordination: Coordination normal.  Psychiatric:        Mood and Affect: Mood normal.        Behavior: Behavior normal.      Imaging: No results found.

## 2020-12-27 NOTE — Telephone Encounter (Signed)
From what I can see Dr. Prince Rome looks like he refilled gabapentin for 300 mg at night.  I think we need to get a CT scan of the lumbar spine.  That should be able to get done pretty easily.  Please inform them if he is not doing well pain wise after the gabapentin we can increase that during the day or potentially provide something like hydrocodone temporarily.  Another avenue of treatment would be to have him follow-up with Dr. Otelia Sergeant which is where he started.

## 2020-12-27 NOTE — Addendum Note (Signed)
Addended by: Lillia Carmel on: 12/27/2020 10:20 AM   Modules accepted: Orders

## 2020-12-27 NOTE — Telephone Encounter (Signed)
CT ordered. Patient is requesting a refill of hydrocodone. I think he was requesting this originally, but message said gabapentin.

## 2020-12-30 ENCOUNTER — Other Ambulatory Visit: Payer: Self-pay | Admitting: Physical Medicine and Rehabilitation

## 2020-12-30 MED ORDER — HYDROCODONE-ACETAMINOPHEN 5-325 MG PO TABS
1.0000 | ORAL_TABLET | Freq: Four times a day (QID) | ORAL | 0 refills | Status: DC | PRN
Start: 2020-12-30 — End: 2021-01-28

## 2020-12-30 NOTE — Progress Notes (Signed)
Patient's 12 month [place] CSRS database history was reviewed and no inappropriate medication refills noted.   

## 2020-12-30 NOTE — Telephone Encounter (Signed)
done

## 2021-01-16 ENCOUNTER — Other Ambulatory Visit: Payer: Self-pay

## 2021-01-16 ENCOUNTER — Telehealth: Payer: Self-pay

## 2021-01-16 ENCOUNTER — Ambulatory Visit
Admission: RE | Admit: 2021-01-16 | Discharge: 2021-01-16 | Disposition: A | Discharge status: Home / Self Care | Payer: Medicare PPO | Source: Ambulatory Visit | Attending: Physical Medicine and Rehabilitation | Admitting: Physical Medicine and Rehabilitation

## 2021-01-16 ENCOUNTER — Telehealth: Payer: Self-pay | Admitting: Physical Medicine and Rehabilitation

## 2021-01-16 DIAGNOSIS — M47816 Spondylosis without myelopathy or radiculopathy, lumbar region: Secondary | ICD-10-CM

## 2021-01-16 DIAGNOSIS — M545 Low back pain, unspecified: Secondary | ICD-10-CM | POA: Diagnosis not present

## 2021-01-16 NOTE — Telephone Encounter (Signed)
Left message with patient's son to schedule CT review with possible SI joint injection. Tuesday 1/25 at 1100.

## 2021-01-16 NOTE — Telephone Encounter (Signed)
Scheduled

## 2021-01-16 NOTE — Telephone Encounter (Signed)
Patients son called regarding VM that was left. [place]

## 2021-01-28 ENCOUNTER — Encounter: Payer: Self-pay | Admitting: Hematology

## 2021-01-28 ENCOUNTER — Ambulatory Visit: Payer: Self-pay

## 2021-01-28 ENCOUNTER — Encounter: Payer: Self-pay | Admitting: Physical Medicine and Rehabilitation

## 2021-01-28 ENCOUNTER — Other Ambulatory Visit: Payer: Self-pay

## 2021-01-28 ENCOUNTER — Ambulatory Visit: Payer: Medicare PPO | Admitting: Physical Medicine and Rehabilitation

## 2021-01-28 VITALS — BP 155/59 | HR 70

## 2021-01-28 DIAGNOSIS — M5416 Radiculopathy, lumbar region: Secondary | ICD-10-CM | POA: Diagnosis not present

## 2021-01-28 DIAGNOSIS — Z9689 Presence of other specified functional implants: Secondary | ICD-10-CM

## 2021-01-28 DIAGNOSIS — C414 Malignant neoplasm of pelvic bones, sacrum and coccyx: Secondary | ICD-10-CM | POA: Diagnosis not present

## 2021-01-28 DIAGNOSIS — M25552 Pain in left hip: Secondary | ICD-10-CM | POA: Diagnosis not present

## 2021-01-28 DIAGNOSIS — C8290 Follicular lymphoma, unspecified, unspecified site: Secondary | ICD-10-CM | POA: Diagnosis not present

## 2021-01-28 DIAGNOSIS — M48062 Spinal stenosis, lumbar region with neurogenic claudication: Secondary | ICD-10-CM | POA: Diagnosis not present

## 2021-01-28 MED ORDER — HYDROCODONE-ACETAMINOPHEN 5-325 MG PO TABS: 1.0000 | ORAL_TABLET | Freq: Four times a day (QID) | ORAL | 0 refills | Status: DC | PRN

## 2021-01-28 MED ORDER — METHYLPREDNISOLONE ACETATE 80 MG/ML IJ SUSP
80.0000 mg | Freq: Once | INTRAMUSCULAR | Status: AC
  Administered 2021-01-28: 80 mg

## 2021-01-28 NOTE — Progress Notes (Signed)
KORVER GRAYBEAL - 87 y.o. male MRN [id number]  Date of birth: October 06, 1924  Office Visit Note: Visit Date: 01/28/2021 PCP: [name], MD Referred by: [name], MD  Subjective: Chief Complaint  Patient presents with  . Lower Back - Pain   HPI: Jacob Warner is a 87 y.o. male who comes in today For evaluation management of chronic worsening severe left more than right bilateral low back pain with left hip and leg pain.  Patient is here with his son who provides some of the history.  His history can be reviewed through our notes and the notes of Dr. Basil Dess in the office.  We have seen the patient off and on for the last several years and the patient does have a implanted spinal cord stimulator.  We have had issues in the past with him using the stimulator as well as broken equipment.  According to the son they do have all the equipment in place now but the son is really not sure how the patient is using it and the patient seems to be somewhat confused about charging the unit versus actually turning the unit on using it.  Again all this can be reviewed.  New symptomatic pain has been this left lower lumbar upper buttock pain now with referral in the posterior lateral leg to the knee and occasionally past the knee into the foot.  No real paresthesias but sharp pain.  Worse with any kind of movement going sit to stand or rotating.  He does feel like his leg will go out at times if he is walking.  He does ambulate with a walker.  He has had some falls but no recent major trauma.  He has had history of old sacral fractures as well as lumbar compression fracture.  Again all these can be reviewed.  We had recently completed radiofrequency ablation of the lumbar facet joints because he was having this axial pain in the past that it helped.  Because he was having such increasing pain despite that procedure we did obtain new CT scan of the lumbar spine.  This is reviewed with the patient and the  patient's son today.  This again shows multifactorial moderate severe stenosis at L4-5 with facet arthropathy throughout.  This also shows sacroiliac joint osteoarthritis.  There is also a sclerotic lesion of the left ilium which was not present in 2019 on the prior CT scan of the pelvis.  Also interestingly the results of this were not called to anyone from the imaging radiologist.  I did review the images with the patient and the patient's son today showing him the differences in the lesion on the CT scan from just a few days ago to the one from 2019.  Patient does have a history of follicular lymphoma.  He is followed by Dr. Irene Limbo at the [place] [facility].  He denies any bowel or bladder dysfunction.  He denies any other new issues.  No fevers chills night sweats or specific night pain.  Pain in left hip is pretty much constant worsening with movement.  He has been using some hydrocodone but has not been taking this regularly but he does take it from time to time and it does help.  Review of Systems  Musculoskeletal: Positive for back pain and joint pain.       Left hip and leg pain  All other systems reviewed and are negative.  Otherwise per HPI.  Assessment & Plan:  Visit Diagnoses:    ICD-10-CM   1. Lumbar radiculopathy  M54.16 XR C-ARM NO REPORT    Epidural Steroid injection    methylPREDNISolone acetate (DEPO-MEDROL) injection 80 mg  2. Spinal stenosis of lumbar region with neurogenic claudication  M48.062 XR C-ARM NO REPORT    Epidural Steroid injection    methylPREDNISolone acetate (DEPO-MEDROL) injection 80 mg  3. S/P insertion of spinal cord stimulator  Z96.89   4. Cancer of ilium ([place])  C41.4   5. Follicular lymphoma, unspecified grade, unspecified body region [facility])  C82.90 Ambulatory referral to Hematology  6. Pain in left hip  M25.552 Ambulatory referral to Hematology     Plan: Findings:  1.  Chronic worsening severe left lower lumbar upper buttock pain with some referral  posterior laterally into the thigh to the knee.  Sometimes referral past the knee into the foot.  Symptoms more of an L4-L5 radicular type component to this but worse with movement and getting up and down which seems to be inconsistent with just lumbar stenosis.  I think he is having pain from the lesion in the ileum on the left.  At this point I refilled the hydrocodone and I am happy to continue that.  I am also going to send this note to his oncologist for review and getting him into see them sooner than later as there may be some treatment they can offer for the pain of his ileum and to make sure what lesion this is and what is coming from.  I also want to complete a left L4 transforaminal injection today.  This would be an epidural steroid injection at the level of stenosis since he is getting radicular pain.  The referral pattern could be from the lesion but it is seems to be more radicular in nature and it seems to have a combination of the 2 pains.  This will be done today.  Lastly I will contact Rachel Bo from [place] to call them about educating the son in particular on how the spinal cord stimulator can be used in make sure the patient is using that to full advantage.    Meds & Orders:  Meds ordered this encounter  Medications  . HYDROcodone-acetaminophen (NORCO/VICODIN) 5-325 MG tablet    Sig: Take 1 tablet by mouth every 6 (six) hours as needed for moderate pain or severe pain.    Dispense:  60 tablet    Refill:  0  . methylPREDNISolone acetate (DEPO-MEDROL) injection 80 mg    Orders Placed This Encounter  Procedures  . XR C-ARM NO REPORT  . Ambulatory referral to Hematology  . Epidural Steroid injection    Follow-up: No follow-ups on file.   Procedures: No procedures performed  Lumbosacral Transforaminal Epidural Steroid Injection - Sub-Pedicular Approach with Fluoroscopic Guidance  Patient: Jacob Warner      Date of Birth: 23-Oct-1924 MRN: [id number] PCP:  [name], MD      Visit Date: 01/28/2021   Universal Protocol:    Date/Time: 01/28/2021  Consent Given By: the patient  Position: PRONE  Additional Comments: Vital signs were monitored before and after the procedure. Patient was prepped and draped in the usual sterile fashion. The correct patient, procedure, and site was verified.   Injection Procedure Details:   Procedure diagnoses: Lumbar radiculopathy [M54.16]    Meds Administered:  Meds ordered this encounter  Medications  . HYDROcodone-acetaminophen (NORCO/VICODIN) 5-325 MG tablet    Sig: Take 1 tablet by mouth every 6 (  six) hours as needed for moderate pain or severe pain.    Dispense:  60 tablet    Refill:  0  . methylPREDNISolone acetate (DEPO-MEDROL) injection 80 mg    Laterality: Left  Location/Site:  L4-L5  Needle:5.0 in., 22 ga.  Short bevel or Quincke spinal needle  Needle Placement: Transforaminal  Findings:    -Comments: Excellent flow of contrast along the nerve, nerve root and into the epidural space.  Procedure Details: After squaring off the end-plates to get a true AP view, the C-arm was positioned so that an oblique view of the foramen as noted above was visualized. The target area is just inferior to the "nose of the scotty dog" or sub pedicular. The soft tissues overlying this structure were infiltrated with 2-3 ml. of 1% Lidocaine without Epinephrine.  The spinal needle was inserted toward the target using a "trajectory" view along the fluoroscope beam.  Under AP and lateral visualization, the needle was advanced so it did not puncture dura and was located close the 6 O'Clock position of the pedical in AP tracterory. Biplanar projections were used to confirm position. Aspiration was confirmed to be negative for CSF and/or blood. A 1-2 ml. volume of Isovue-250 was injected and flow of contrast was noted at each level. Radiographs were obtained for documentation purposes.   After attaining the  desired flow of contrast documented above, a 0.5 to 1.0 ml test dose of 0.25% Marcaine was injected into each respective transforaminal space.  The patient was observed for 90 seconds post injection.  After no sensory deficits were reported, and normal lower extremity motor function was noted,   the above injectate was administered so that equal amounts of the injectate were placed at each foramen (level) into the transforaminal epidural space.   Additional Comments:  The patient tolerated the procedure well Dressing: 2 x 2 sterile gauze and Band-Aid    Post-procedure details: Patient was observed during the procedure. Post-procedure instructions were reviewed.  Patient left the clinic in stable condition.      Clinical History: CT LUMBAR SPINE WITHOUT CONTRAST  TECHNIQUE: Multidetector CT imaging of the lumbar spine was performed without intravenous contrast administration. Multiplanar CT image reconstructions were also generated.  COMPARISON:  02/15/2020, 07/29/2018  FINDINGS: Segmentation: 5 lumbar type vertebrae.  Alignment: 5 mm retrolisthesis of L1 on L2. 2 mm retrolisthesis of L2 on L3.  Vertebrae: No acute osseous abnormality. No aggressive osseous lesion. No discitis or osteomyelitis. Chronic L1 vertebral body compression fracture with approximately 50% height loss. Old healed left sacral ala fracture. Osteoarthritis of bilateral SI joints. Spinal stimulator with the leads entering the spinal canal at T12-L1 and directed cephalad. Sclerotic bone lesion in the left posterior ilium with interspersed areas of lucency concerning for indeterminate malignancy.  Paraspinal and other soft tissues: No acute paraspinal abnormality. Abdominal aortic atherosclerosis.  Disc levels:  Disc spaces: Degenerative disease with disc height loss at L1-2.  T12-L1: Minimal broad-based disc bulge. Moderate bilateral facet arthropathy. No foraminal or central canal  stenosis.  L1-L2: Broad-based disc bulge with a left paracentral focal disc osteophyte complex. Moderate left foraminal stenosis. No right foraminal stenosis. Mild bilateral facet arthropathy.  L2-L3: Minimal broad-based disc bulge. Mild bilateral facet arthropathy. Mild right foraminal stenosis. No left foraminal stenosis. Moderate spinal stenosis.  L3-L4: Broad-based disc bulge. Moderate bilateral facet arthropathy. Moderate spinal stenosis. Mild left and moderate right foraminal stenosis.  L4-L5: Broad-based disc bulge. Moderate bilateral facet arthropathy with ligamentum flavum infolding. Moderate-severe spinal  stenosis. Moderate left foraminal stenosis. No right foraminal stenosis.  L5-S1: Mild broad-based disc bulge. Severe right and moderate left facet arthropathy.  IMPRESSION: 1. Diffuse lumbar spine spondylosis as described above. 2. No acute osseous injury of the lumbar spine. 3. Chronic L1 vertebral body compression fracture. 4. Sclerotic bone lesion in the left posterior ilium with interspersed areas of lucency concerning for indeterminate malignancy. 5. Aortic Atherosclerosis (ICD10-I70.0).   Electronically Signed   By: Kathreen Devoid   On: 01/16/2021 13:18 ------- CT PELVIS WITHOUT CONTRAST   COMPARISON: CT of the abdomen and pelvis on 06/26/2016, plain films 07/25/2018  FINDINGS: Urinary Tract: Normal appearance of the urinary bladder and distal ureters.  Bowel: Nonobstructed bowel-gas pattern. There are numerous radiopaque tablets within the cecum and descending colon. I count approximately 25 tablets in the region of the cecum. These are primarily uniform and size, measuring approximately 1 centimeter in diameter. Findings are consistent with stasis but also raise the possibility of numerous tablets ingested at a single time.  Vascular/Lymphatic: There is dense atherosclerotic calcification of the abdominal aorta and femoral arteries. No  aneurysm. No retroperitoneal or mesenteric adenopathy.  Reproductive: Prostatic calcifications are present.   Musculoskeletal: Remote fractures of the superior and inferior pubic rami bilaterally. Remote LEFT sacral fracture. No evidence for acute fracture or subluxation. Nondisplaced fractures cannot be excluded. Small amount of stranding in the subcutaneous region of the LEFT buttock.  IMPRESSION: 1. No acute, displaced fracture. Depending on the clinical suspicion, MRI may be helpful in detecting an nondisplaced fracture. 2. Remote fractures of the LEFT sacrum, superior and inferior pubic rami bilaterally. 3. Numerous radiopaque tablets in the proximal colon. Although the findings could be related to stasis, overdose needs to be considered. 4. Persistent soft tissue in the region the superior mesenteric artery and vein. The appearance is nonspecific and stable.   By: Nolon Nations M.D. On: 07/29/2018 11:37   He reports that he has never smoked. He has never used smokeless tobacco. No results for input(s): HGBA1C, LABURIC in the last 8760 hours.  Objective:  VS:  HT:    WT:   BMI:     BP:(!) 155/59  HR:70bpm  TEMP: ( )  RESP:  Physical Exam Vitals and nursing note reviewed.  Constitutional:      General: He is not in acute distress.    Appearance: Normal appearance. He is not ill-appearing.  HENT:     Head: Normocephalic and atraumatic.     Right Ear: External ear normal.     Left Ear: External ear normal.     Nose: No congestion.  Eyes:     Extraocular Movements: Extraocular movements intact.  Cardiovascular:     Rate and Rhythm: Normal rate.     Pulses: Normal pulses.  Pulmonary:     Effort: Pulmonary effort is normal. No respiratory distress.  Abdominal:     General: There is no distension.     Palpations: Abdomen is soft.  Musculoskeletal:        General: No tenderness or signs of injury.     Cervical back: Neck supple.     Right lower leg: No  edema.     Left lower leg: No edema.     Comments: Patient has good distal strength without clonus.  He has some pain to palpation diffusely across the lower lumbar junction and PSIS.  He has pain with external rotation of the left hip but not internal rotation.  No groin pain.  He does  ambulate with a walker.  Skin:    Findings: No erythema or rash.  Neurological:     General: No focal deficit present.     Mental Status: He is alert and oriented to person, place, and time.     Sensory: No sensory deficit.     Motor: No weakness or abnormal muscle tone.     Coordination: Coordination normal.  Psychiatric:        Mood and Affect: Mood normal.        Behavior: Behavior normal.     Ortho Exam  Imaging: No results found.  Past Medical/Family/Surgical/Social History: Medications & Allergies reviewed per EMR, new medications updated. Patient Active Problem List   Diagnosis Date Noted  . S/P insertion of spinal cord stimulator 02/15/2020  . Spondylosis without myelopathy or radiculopathy, lumbar region 02/15/2020  . Dizzy 09/14/2019  . Chronic diastolic heart failure ([place]) 08/03/2019  . Cough 11/10/2018  . PAF (paroxysmal atrial fibrillation) ([place]) 11/02/2018  . De Quervain's tenosynovitis, right 02/09/2018  . Wrist pain, acute, right 02/09/2018  . Post laminectomy syndrome 03/19/2017  . Chronic pain syndrome 03/19/2017  . Chronic bilateral low back pain without sciatica 03/19/2017  . After cataract of both eyes not obscuring vision 03/03/2016  . 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 Primary open-angle glaucoma, right eye, indeterminate stage 03/03/2016  . Squamous blepharitis of upper and lower eyelids of both eyes 03/03/2016  . Bradycardia 05/31/2015  . Pacemaker 05/31/2015  . Peripheral edema 05/31/2015  . Heart murmur 05/31/2015  . Sick sinus syndrome ([place]) 05/31/2015  . Nonsustained ventricular tachycardia ([place]) 05/31/2015  . Follicular lymphoma ([place])  04/02/2015   Past Medical History:  Diagnosis Date  . A-fib ([place])   . Alopecia   . Anemia   . Bradycardia   . Cancer ([place])   . Cherry angioma   . Echocardiogram    Echo 11/19: Mild concentric LVH, EF 50-55, normal wall motion, grade 1 diastolic dysfunction, mild AI, MAC, mild MR, normal RVSF, moderate TR, PASP 36  . GERD (gastroesophageal reflux disease)   . Glaucoma   . Hypertension   . Nuclear stress test    Nuclear stress test 11/19: not gated, inf and apical defect c/w small scar and/or soft tissue attenuation  . Pelvic fracture ([place])   . Right BBB/left ant fasc block   . Sick sinus syndrome ([place])   . TIA (transient ischemic attack)   . Xeroderma    Family History  Problem Relation Age of Onset  . Brain cancer Sister    Past Surgical History:  Procedure Laterality Date  . APPENDECTOMY    . CERVICAL SPINE SURGERY    . EXPLORATORY LAPAROTOMY    . LITHOTRIPSY    . PACEMAKER PLACEMENT    . PPM GENERATOR CHANGEOUT N/A 05/10/2020   Procedure: PPM GENERATOR CHANGEOUT;  Surgeon: [name], MD;  Location: [facility];  Service: Cardiovascular;  Laterality: N/A;  . SPINAL CORD STIMULATOR IMPLANT Bilateral 2018   [name], MD PhD, [facility]   Social History   Occupational History  . Not on file  Tobacco Use  . Smoking status: Never Smoker  . Smokeless tobacco: Never Used  Vaping Use  . Vaping Use: Never used  Substance and Sexual Activity  . Alcohol use: No    Alcohol/week: 0.0 standard drinks  . Drug use: No  . Sexual activity: Not on file

## 2021-01-28 NOTE — Progress Notes (Signed)
Left buttock pain radiating to posterior knee and to foot.  Numeric Pain Rating Scale and Functional Assessment Average Pain 10   In the last MONTH (on 0-10 scale) has pain interfered with the following?  1. General activity like being  able to carry out your everyday physical activities such as walking, climbing stairs, carrying groceries, or moving a chair?  Rating(10)   +Driver, -BT, -Dye Allergies.

## 2021-01-28 NOTE — Procedures (Signed)
Lumbosacral Transforaminal Epidural Steroid Injection - Sub-Pedicular Approach with Fluoroscopic Guidance  Patient: Jacob Warner      Date of Birth: 07/01/1924 MRN: [id number] PCP: [name], MD      Visit Date: 01/28/2021   Universal Protocol:    Date/Time: 01/28/2021  Consent Given By: the patient  Position: PRONE  Additional Comments: Vital signs were monitored before and after the procedure. Patient was prepped and draped in the usual sterile fashion. The correct patient, procedure, and site was verified.   Injection Procedure Details:   Procedure diagnoses: Lumbar radiculopathy [M54.16]    Meds Administered:  Meds ordered this encounter  Medications  . HYDROcodone-acetaminophen (NORCO/VICODIN) 5-325 MG tablet    Sig: Take 1 tablet by mouth every 6 (six) hours as needed for moderate pain or severe pain.    Dispense:  60 tablet    Refill:  0  . methylPREDNISolone acetate (DEPO-MEDROL) injection 80 mg    Laterality: Left  Location/Site:  L4-L5  Needle:5.0 in., 22 ga.  Short bevel or Quincke spinal needle  Needle Placement: Transforaminal  Findings:    -Comments: Excellent flow of contrast along the nerve, nerve root and into the epidural space.  Procedure Details: After squaring off the end-plates to get a true AP view, the C-arm was positioned so that an oblique view of the foramen as noted above was visualized. The target area is just inferior to the "nose of the scotty dog" or sub pedicular. The soft tissues overlying this structure were infiltrated with 2-3 ml. of 1% Lidocaine without Epinephrine.  The spinal needle was inserted toward the target using a "trajectory" view along the fluoroscope beam.  Under AP and lateral visualization, the needle was advanced so it did not puncture dura and was located close the 6 O'Clock position of the pedical in AP tracterory. Biplanar projections were used to confirm position. Aspiration was confirmed to be negative  for CSF and/or blood. A 1-2 ml. volume of Isovue-250 was injected and flow of contrast was noted at each level. Radiographs were obtained for documentation purposes.   After attaining the desired flow of contrast documented above, a 0.5 to 1.0 ml test dose of 0.25% Marcaine was injected into each respective transforaminal space.  The patient was observed for 90 seconds post injection.  After no sensory deficits were reported, and normal lower extremity motor function was noted,   the above injectate was administered so that equal amounts of the injectate were placed at each foramen (level) into the transforaminal epidural space.   Additional Comments:  The patient tolerated the procedure well Dressing: 2 x 2 sterile gauze and Band-Aid    Post-procedure details: Patient was observed during the procedure. Post-procedure instructions were reviewed.  Patient left the clinic in stable condition.

## 2021-01-28 NOTE — Patient Instructions (Signed)

## 2021-01-31 ENCOUNTER — Telehealth: Payer: Self-pay | Admitting: Hematology

## 2021-01-31 NOTE — Telephone Encounter (Signed)
Scheduled appt per 2/2 sch msg - left message for pt with appt date and time

## 2021-02-03 NOTE — Progress Notes (Signed)
HEMATOLOGY/ONCOLOGY CONSULTATION NOTE  Date of Service: 02/03/2021  Patient Care Team: [name], MD as PCP - General (Family Medicine) [name], MD as PCP - Cardiology (Cardiology) [name], MD as PCP - Electrophysiology (Cardiology)  CHIEF COMPLAINTS/PURPOSE OF CONSULTATION:  Abnormal CBC/ Hx of Follicular Lymphoma  HISTORY OF PRESENTING ILLNESS:   Jacob Warner is a wonderful 87 y.o. male who has been referred to [place] by Dr. [name] for evaluation and management of abnormal CBC/history of follicular lymphoma. Pt is accompanied today by his son. The pt reports that he is doing well overall.   The pt reports that he was diagnosed with and treated for Follicular Lymphoma in 7414. He completed 5 months of Ritxuan, Cytoxan, and Prednisone. He has since been in remission.   A few months ago pt began having pain in his right-sided lower back. He found relief from this discomfort after he increased his water intake. The pain as since returned and remains despite the pt continuing to drink an adequate amount of water. Pt was referred to a Nephrologist after discussing his back pain with [name], his PCP. Pt has previously had kidney stones and has been told that he has BPH, but this has not disturbed his urinary habits. He denies seeing any blood in his urine, but was found to have hematuria on recent labs with [name]. Pt had an abdominal US in September, which showed a benign left kidney cyst. Pt has chronic back pain for which he receives steroid injections.    He has no history of thyroid disorders. Pt takes a baby Aspirin for his Afib and Pantoprazole daily. Pt has a history of dysphagia and has required esophageal dilation. He has lost nearly 40 lbs in the last six months. Pt has been eating less the last few months due to a decreased appetite and a change in routine. He also reports smaller stools in the last 6-8 months. His stools used to be round, but are now much  thinner. This is completely new and persistent.   Of note since the patient's last visit, pt has had [place] Abd ([id number]) completed on 09/04/2020 with results revealing "Stable 2.8 x 3.5 x 2.3 cm left superior pole simple renal cyst. Otherwise unremarkable upper abdominal ultrasound with the pancreas not visualized."  Most recent lab results (08/21/2020) of CBC w/diff and CMP is as follows: all values are WNL except for RBC at 3.05, Hgb at 9.9, HCT at 29.3, MCV at 96.0, Neutro Rel at 73.8, Lymphs Rel at 10.3, Lymphs Abs at 0.90K, Glucose at 103, BUN at 32, Creatinine at 1.46, GFR Est Non Af Am at 45, ALP at 140.  On review of systems, pt reports dysphagia, weight loss, low appetite, back pain, change in appearance of stools, neuropathy and denies nausea, abdominal pain, hematuria, fevers, chills, night sweats, new lumps/bumps, bloody stools, abdominal pain, testicular pain/swelling and any other symptoms.   On PMHx the pt reports Follicular Lymphoma, Cervical Spine Surgery, Spinal Cord Stimulator Implant, Afib, Pelvic fracture, Reflux, BPH, Kidney Stones.   INTERVAL HISTORY: Jacob Warner is a wonderful 87 y.o. male who is here for evaluation and management of abnormal CBC/history of follicular lymphoma. The patient's last visit with [place] was on 10/01/2020. The pt reports that he is doing well overall. We are seeing him back because he was having back pains and a CT Lumbar Spine ordered by his orthopedic doctor showed a chronic fracture and arthritis. There was  a bone lesion in left posterior ilium.   The pt reports that his back pain had worsened recently on his left side to where it made movement difficult. This has been going on for 5-6 months. The doctor felt that this lesion was completely new from the 2019 scan and this could possibly be causing the pain. They did an epidural steroid injection that helped alleviate the pt's pain. He is also on Hydrocodone for the pain.   The pt notes that  around one month ago his PCP noted that his iron levels were low and thus the pt is now taking Iron pills daily for the last 3-4 weeks. The pt notes a history of prostate issues.  On review of systems, pt reports lower left back pain, decreased urination and denies bloody stools, changes in urination, blood in urine, changes in bowel habits, new bone pain, rib pain, abdominal pain, decreased appetite, new skin nodules, leg swelling and any other symptoms.  MEDICAL HISTORY:  Past Medical History:  Diagnosis Date  . A-fib ([place])   . Alopecia   . Anemia   . Bradycardia   . Cancer ([place])   . Cherry angioma   . Echocardiogram    Echo 11/19: Mild concentric LVH, EF 50-55, normal wall motion, grade 1 diastolic dysfunction, mild AI, MAC, mild MR, normal RVSF, moderate TR, PASP 36  . GERD (gastroesophageal reflux disease)   . Glaucoma   . Hypertension   . Nuclear stress test    Nuclear stress test 11/19: not gated, inf and apical defect c/w small scar and/or soft tissue attenuation  . Pelvic fracture ([place])   . Right BBB/left ant fasc block   . Sick sinus syndrome ([place])   . TIA (transient ischemic attack)   . Xeroderma     SURGICAL HISTORY: Past Surgical History:  Procedure Laterality Date  . APPENDECTOMY    . CERVICAL SPINE SURGERY    . EXPLORATORY LAPAROTOMY    . LITHOTRIPSY    . PACEMAKER PLACEMENT    . PPM GENERATOR CHANGEOUT N/A 05/10/2020   Procedure: PPM GENERATOR CHANGEOUT;  Surgeon: [name], MD;  Location: [facility];  Service: Cardiovascular;  Laterality: N/A;  . SPINAL CORD STIMULATOR IMPLANT Bilateral 2018   [name], MD PhD, [facility]    SOCIAL HISTORY: Social History   Socioeconomic History  . Marital status: Married    Spouse name: Not on file  . Number of children: Not on file  . Years of education: Not on file  . Highest education level: Not on file  Occupational History  . Not on file  Tobacco Use  .  Smoking status: Never Smoker  . Smokeless tobacco: Never Used  Vaping Use  . Vaping Use: Never used  Substance and Sexual Activity  . Alcohol use: No    Alcohol/week: 0.0 standard drinks  . Drug use: No  . Sexual activity: Not on file  Other Topics Concern  . Not on file  Social History Narrative  . Not on file   Social Determinants of Health   Financial Resource Strain: Not on file  Food Insecurity: Not on file  Transportation Needs: Not on file  Physical Activity: Not on file  Stress: Not on file  Social Connections: Not on file  Intimate Partner Violence: Not on file    FAMILY HISTORY: Family History  Problem Relation Age of Onset  . Brain cancer Sister     ALLERGIES:  is  allergic to sulfa antibiotics and sulfacetamide sodium.  MEDICATIONS:  Current Outpatient Medications  Medication Sig Dispense Refill  . acetaminophen (TYLENOL) 500 MG tablet Take 500-1,000 mg by mouth See admin instructions. 500 mg twice daily, 1000 mg at bedtime    . aspirin 81 MG tablet Take 81 mg by mouth daily.    Marland Kitchen azelastine (ASTELIN) 0.1 % nasal spray Place 1 spray into both nostrils 2 (two) times daily as needed for rhinitis.     Marland Kitchen denosumab (PROLIA) 60 MG/ML SOSY injection Inject 60 mg into the skin every 6 (six) months.    . dutasteride (AVODART) 0.5 MG capsule Take 0.5 mg by mouth daily.    . furosemide (LASIX) 40 MG tablet Take 40 mg by mouth daily.    Marland Kitchen gabapentin (NEURONTIN) 300 MG capsule Take 1 capsule (300 mg total) by mouth at bedtime. 90 capsule 1  . HYDROcodone-acetaminophen (NORCO/VICODIN) 5-325 MG tablet Take 1 tablet by mouth every 6 (six) hours as needed for moderate pain or severe pain. 60 tablet 0  . hydroxypropyl methylcellulose / hypromellose (ISOPTO TEARS / GONIOVISC) 2.5 % ophthalmic solution Place 1 drop into both eyes as needed for dry eyes.    Marland Kitchen ketoconazole (NIZORAL) 2 % shampoo Apply 1 application topically 2 (two) times a week.     . lactulose (CHRONULAC) 10  GM/15ML solution Take 10 g by mouth daily.  0  . latanoprost (XALATAN) 0.005 % ophthalmic solution Place 1 drop into both eyes at bedtime.   11  . Magnesium Oxide 400 (240 Mg) MG TABS Take 1 tablet by mouth 2 (two) times daily.  7  . meloxicam (MOBIC) 15 MG tablet Take 15 mg by mouth daily.    . nitroGLYCERIN (NITROSTAT) 0.4 MG SL tablet Place 1 tablet (0.4 mg total) under the tongue every 5 (five) minutes as needed for chest pain. 25 tablet 5  . pantoprazole (PROTONIX) 40 MG tablet Take 1 tablet by mouth daily.    . RESTASIS 0.05 % ophthalmic emulsion Place 1 drop into both eyes daily.   11  . ROCKLATAN 0.02-0.005 % SOLN     . topiramate (TOPAMAX) 25 MG tablet     . traZODone (DESYREL) 50 MG tablet Take 50 mg by mouth at bedtime.   11  . vitamin B-12 (CYANOCOBALAMIN) 1000 MCG tablet Take 1,000 mcg by mouth daily.     . White Petrolatum-Mineral Oil (EYE LUBRICANT) OINT Apply 1 strip to eye at bedtime.     No current facility-administered medications for this visit.    REVIEW OF SYSTEMS:   10 Point review of Systems was done is negative except as noted above.  PHYSICAL EXAMINATION: ECOG PERFORMANCE STATUS: 1 - Symptomatic but completely ambulatory  Exam was given in a chair.  GENERAL:alert, in no acute distress and comfortable SKIN: no acute rashes, no significant lesions EYES: conjunctiva are pink and non-injected, sclera anicteric OROPHARYNX: MMM, no exudates, no oropharyngeal erythema or ulceration NECK: supple, no JVD LYMPH:  no palpable lymphadenopathy in the cervical, axillary or inguinal regions LUNGS: clear to auscultation b/l with normal respiratory effort HEART: regular rate & rhythm ABDOMEN:  normoactive bowel sounds , non tender, not distended. Extremity: no pedal edema PSYCH: alert & oriented x 3 with fluent speech NEURO: no focal motor/sensory deficits  LABORATORY DATA:  I have reviewed the data as listed  . CBC Latest Ref Rng & Units 09/17/2020 09/17/2020  04/20/2020  WBC 4.0 - 10.5 K/uL 7.4 - 5.5  Hemoglobin 13.0 -  17.0 g/dL 11.3(L) - 11.4(L)  Hematocrit 37.5 - 51.0 % 34.1(L) 34.8(L) 34.2(L)  Platelets 150 - 400 K/uL 204 - 221   . CBC    Component Value Date/Time   WBC 7.4 09/17/2020 1204   RBC 3.52 (L) 09/17/2020 1204   HGB 11.3 (L) 09/17/2020 1204   HGB 11.4 (L) 04/20/2020 1106   HGB 12.2 (L) 07/08/2017 1336   HCT 34.1 (L) 09/17/2020 1205   HCT 34.8 (L) 09/17/2020 1204   HCT 35.6 (L) 07/08/2017 1336   PLT 204 09/17/2020 1204   PLT 221 04/20/2020 1106   MCV 98.9 09/17/2020 1204   MCV 97 04/20/2020 1106   MCV 96.2 07/08/2017 1336   MCH 32.1 09/17/2020 1204   MCHC 32.5 09/17/2020 1204   RDW 13.4 09/17/2020 1204   RDW 13.2 04/20/2020 1106   RDW 13.9 07/08/2017 1336   LYMPHSABS 1.1 09/17/2020 1204   LYMPHSABS 1.3 04/20/2020 1106   LYMPHSABS 0.8 (L) 07/08/2017 1336   MONOABS 0.6 09/17/2020 1204   MONOABS 0.5 07/08/2017 1336   EOSABS 0.2 09/17/2020 1204   EOSABS 0.2 04/20/2020 1106   BASOSABS 0.0 09/17/2020 1204   BASOSABS 0.0 04/20/2020 1106   BASOSABS 0.0 07/08/2017 1336    . CMP Latest Ref Rng & Units 09/17/2020 04/20/2020 02/08/2020  Glucose 70 - 99 mg/dL 89 82 92  BUN 8 - 23 mg/dL 31(H) 27 25  Creatinine 0.61 - 1.24 mg/dL 1.43(H) 1.51(H) 1.64(H)  Sodium 135 - 145 mmol/L 142 139 141  Potassium 3.5 - 5.1 mmol/L 4.2 4.5 4.7  Chloride 98 - 111 mmol/L 103 105 103  CO2 22 - 32 mmol/L 31 30(H) 24  Calcium 8.9 - 10.3 mg/dL 9.4 9.4 8.8  Total Protein 6.5 - 8.1 g/dL 6.8 - 5.9(L)  Total Bilirubin 0.3 - 1.2 mg/dL 0.5 - 0.4  Alkaline Phos 38 - 126 U/L 96 - 57  AST 15 - 41 U/L 22 - 24  ALT 0 - 44 U/L 17 - 14     RADIOGRAPHIC STUDIES: I have personally reviewed the radiological images as listed and agreed with the findings in the report. Epidural Steroid injection  Result Date: 01/28/2021 Magnus Sinning, MD     01/28/2021 11:57 AM Lumbosacral Transforaminal Epidural Steroid Injection - Sub-Pedicular Approach with  Fluoroscopic Guidance Patient: Jacob Warner     Date of Birth: 1924/08/19 MRN: [id number] PCP: [name], MD     Visit Date: 01/28/2021  Universal Protocol:   Date/Time: 01/28/2021 Consent Given By: the patient Position: PRONE Additional Comments: Vital signs were monitored before and after the procedure. Patient was prepped and draped in the usual sterile fashion. The correct patient, procedure, and site was verified. Injection Procedure Details: Procedure diagnoses: Lumbar radiculopathy [M54.16]  Meds Administered: Meds ordered this encounter Medications . HYDROcodone-acetaminophen (NORCO/VICODIN) 5-325 MG tablet   Sig: Take 1 tablet by mouth every 6 (six) hours as needed for moderate pain or severe pain.   Dispense:  60 tablet   Refill:  0 . methylPREDNISolone acetate (DEPO-MEDROL) injection 80 mg Laterality: Left Location/Site: L4-L5 Needle:5.0 in., 22 ga.  Short bevel or Quincke spinal needle Needle Placement: Transforaminal Findings:   -Comments: Excellent flow of contrast along the nerve, nerve root and into the epidural space. Procedure Details: After squaring off the end-plates to get a true AP view, the C-arm was positioned so that an oblique view of the foramen as noted above was visualized. The target area is just inferior to the "nose of the scotty  dog" or sub pedicular. The soft tissues overlying this structure were infiltrated with 2-3 ml. of 1% Lidocaine without Epinephrine. The spinal needle was inserted toward the target using a "trajectory" view along the fluoroscope beam.  Under AP and lateral visualization, the needle was advanced so it did not puncture dura and was located close the 6 O'Clock position of the pedical in AP tracterory. Biplanar projections were used to confirm position. Aspiration was confirmed to be negative for CSF and/or blood. A 1-2 ml. volume of Isovue-250 was injected and flow of contrast was noted at each level. Radiographs were obtained for documentation purposes.  After attaining the desired flow of contrast documented above, a 0.5 to 1.0 ml test dose of 0.25% Marcaine was injected into each respective transforaminal space.  The patient was observed for 90 seconds post injection.  After no sensory deficits were reported, and normal lower extremity motor function was noted,   the above injectate was administered so that equal amounts of the injectate were placed at each foramen (level) into the transforaminal epidural space. Additional Comments: The patient tolerated the procedure well Dressing: 2 x 2 sterile gauze and Band-Aid  Post-procedure details: Patient was observed during the procedure. Post-procedure instructions were reviewed. Patient left the clinic in stable condition.   CT LUMBAR SPINE WO CONTRAST  Result Date: 01/16/2021 CLINICAL DATA:  Chronic low back pain radiating down the left hip EXAM: CT LUMBAR SPINE WITHOUT CONTRAST TECHNIQUE: Multidetector CT imaging of the lumbar spine was performed without intravenous contrast administration. Multiplanar CT image reconstructions were also generated. COMPARISON:  02/15/2020, 07/29/2018 FINDINGS: Segmentation: 5 lumbar type vertebrae. Alignment: 5 mm retrolisthesis of L1 on L2. 2 mm retrolisthesis of L2 on L3. Vertebrae: No acute osseous abnormality. No aggressive osseous lesion. No discitis or osteomyelitis. Chronic L1 vertebral body compression fracture with approximately 50% height loss. Old healed left sacral ala fracture. Osteoarthritis of bilateral SI joints. Spinal stimulator with the leads entering the spinal canal at T12-L1 and directed cephalad. Sclerotic bone lesion in the left posterior ilium with interspersed areas of lucency concerning for indeterminate malignancy. Paraspinal and other soft tissues: No acute paraspinal abnormality. Abdominal aortic atherosclerosis. Disc levels: Disc spaces: Degenerative disease with disc height loss at L1-2. T12-L1: Minimal broad-based disc bulge. Moderate bilateral  facet arthropathy. No foraminal or central canal stenosis. L1-L2: Broad-based disc bulge with a left paracentral focal disc osteophyte complex. Moderate left foraminal stenosis. No right foraminal stenosis. Mild bilateral facet arthropathy. L2-L3: Minimal broad-based disc bulge. Mild bilateral facet arthropathy. Mild right foraminal stenosis. No left foraminal stenosis. Moderate spinal stenosis. L3-L4: Broad-based disc bulge. Moderate bilateral facet arthropathy. Moderate spinal stenosis. Mild left and moderate right foraminal stenosis. L4-L5: Broad-based disc bulge. Moderate bilateral facet arthropathy with ligamentum flavum infolding. Moderate-severe spinal stenosis. Moderate left foraminal stenosis. No right foraminal stenosis. L5-S1: Mild broad-based disc bulge. Severe right and moderate left facet arthropathy. IMPRESSION: 1. Diffuse lumbar spine spondylosis as described above. 2. No acute osseous injury of the lumbar spine. 3. Chronic L1 vertebral body compression fracture. 4. Sclerotic bone lesion in the left posterior ilium with interspersed areas of lucency concerning for indeterminate malignancy. 5. Aortic Atherosclerosis (ICD10-I70.0). Electronically Signed   By: Kathreen Devoid   On: 01/16/2021 13:18   XR C-ARM NO REPORT  Result Date: 01/28/2021 Please see Notes tab for imaging impression.   ASSESSMENT & PLAN:   87 yo with   1) Mild macrocytic anemia 2) h/o follicular lymphoma in 1601 rx with R-CVP 3)  Sclerotic bone lesion in the left posterior ilium PLAN: Labs today   PLAN: -Advised pt that [place] bone is not weight bearing and generally would not be causing the pain the pt is experiencing. -Discussed future options for evaluation and pt's desires for how in depth to go. -Advised pt this could be tumor changes in bone, non-tumor changes such as calcium changes, Multiple Myeloma tumor from bone marrow (September MMP was normal), prostate tumor marker. -Advised pt we typically get a PET  scan if insurance allows. Will be able to see if any other tumors outside bone that spread and what else is happening in the body. Will help to light up Lymphoma recurrence. -Discussed pt's steroid injections-- help to alleviate pain and advised pt that  This is suggestive of localized pain. -Discussed potential Bm Bx of spot in left ilium if PET scans shows no definitive results. -Advised pt that if it is a localized tumor causing the pain, then localized radiation would be the treatment. -Will get labs today. Will get PET in 1 week. -Will see back in 2 weeks.  FOLLOW UP: Labs today PET/CT in 1 week RTC with Dr Irene Limbo in 2 weeks  . Orders Placed This Encounter  Procedures  . NM PET Image Initial (PI) Skull Base To Thigh    Standing Status:   Future    Standing Expiration Date:   02/04/2022    Order Specific Question:   If indicated for the ordered procedure, I authorize the administration of a radiopharmaceutical per Radiology protocol    Answer:   Yes    Order Specific Question:   Preferred imaging location?    Answer:   [facility]  . CBC with Differential/Platelet    Standing Status:   Future    Number of Occurrences:   1    Standing Expiration Date:   02/04/2022  . CMP ([facility] only)    Standing Status:   Future    Number of Occurrences:   1    Standing Expiration Date:   02/04/2022  . Lactate dehydrogenase    Standing Status:   Future    Number of Occurrences:   1    Standing Expiration Date:   02/04/2022  . Multiple Myeloma Panel (SPEP&IFE w/QIG)    Standing Status:   Future    Number of Occurrences:   1    Standing Expiration Date:   02/04/2022  . PSA, total and free    Standing Status:   Future    Number of Occurrences:   1    Standing Expiration Date:   02/04/2022  . CEA (IN HOUSE-[facility])    Standing Status:   Future    Number of Occurrences:   1    Standing Expiration Date:   02/04/2022  . Ferritin    Standing Status:   Future    Number of Occurrences:   1    Standing  Expiration Date:   02/04/2022  . Iron and TIBC    Standing Status:   Future    Number of Occurrences:   1    Standing Expiration Date:   02/04/2022  . Vitamin D 25 hydroxy    Standing Status:   Future    Number of Occurrences:   1    Standing Expiration Date:   02/04/2022     The total time spent in the appointment was 40 minutes and more than 50% was on counseling and direct patient cares.  All of the  patient's questions were answered with apparent satisfaction. The patient knows to call the clinic with any problems, questions or concerns.  Sullivan Lone MD [place] AAHIVMS [facility]On[facility]  (Office):       [phone] (Work cell):  [phone] (Fax):           [phone]  02/03/2021 9:37 AM  I, Reinaldo Raddle, am acting as scribe for Dr. Sullivan Lone, MD.   .I have reviewed the above documentation for accuracy and completeness, and I agree with the above. Brunetta Genera MD

## 2021-02-04 ENCOUNTER — Other Ambulatory Visit: Payer: Self-pay

## 2021-02-04 ENCOUNTER — Inpatient Hospital Stay: Payer: Medicare PPO | Attending: Hematology | Admitting: Hematology

## 2021-02-04 ENCOUNTER — Inpatient Hospital Stay: Payer: Medicare PPO

## 2021-02-04 DIAGNOSIS — I10 Essential (primary) hypertension: Secondary | ICD-10-CM | POA: Diagnosis not present

## 2021-02-04 DIAGNOSIS — C7951 Secondary malignant neoplasm of bone: Secondary | ICD-10-CM | POA: Diagnosis not present

## 2021-02-04 DIAGNOSIS — C8290 Follicular lymphoma, unspecified, unspecified site: Secondary | ICD-10-CM

## 2021-02-04 DIAGNOSIS — Z8673 Personal history of transient ischemic attack (TIA), and cerebral infarction without residual deficits: Secondary | ICD-10-CM | POA: Diagnosis not present

## 2021-02-04 DIAGNOSIS — Z7982 Long term (current) use of aspirin: Secondary | ICD-10-CM | POA: Diagnosis not present

## 2021-02-04 DIAGNOSIS — D539 Nutritional anemia, unspecified: Secondary | ICD-10-CM | POA: Diagnosis not present

## 2021-02-04 DIAGNOSIS — R972 Elevated prostate specific antigen [PSA]: Secondary | ICD-10-CM | POA: Diagnosis not present

## 2021-02-04 DIAGNOSIS — D649 Anemia, unspecified: Secondary | ICD-10-CM

## 2021-02-04 DIAGNOSIS — G893 Neoplasm related pain (acute) (chronic): Secondary | ICD-10-CM | POA: Insufficient documentation

## 2021-02-04 DIAGNOSIS — C61 Malignant neoplasm of prostate: Secondary | ICD-10-CM | POA: Diagnosis not present

## 2021-02-04 DIAGNOSIS — Z808 Family history of malignant neoplasm of other organs or systems: Secondary | ICD-10-CM | POA: Insufficient documentation

## 2021-02-04 DIAGNOSIS — Z79899 Other long term (current) drug therapy: Secondary | ICD-10-CM | POA: Insufficient documentation

## 2021-02-04 DIAGNOSIS — M899 Disorder of bone, unspecified: Secondary | ICD-10-CM | POA: Diagnosis not present

## 2021-02-04 DIAGNOSIS — Z8572 Personal history of non-Hodgkin lymphomas: Secondary | ICD-10-CM | POA: Diagnosis not present

## 2021-02-04 LAB — CMP (CANCER CENTER ONLY)
ALT: 9 U/L (ref 0–44)
AST: 22 U/L (ref 15–41)
Albumin: 3.9 g/dL (ref 3.5–5.0)
Alkaline Phosphatase: 291 U/L — ABNORMAL HIGH (ref 38–126)
Anion gap: 9 (ref 5–15)
BUN: 33 mg/dL — ABNORMAL HIGH (ref 8–23)
CO2: 30 mmol/L (ref 22–32)
Calcium: 9.4 mg/dL (ref 8.9–10.3)
Chloride: 102 mmol/L (ref 98–111)
Creatinine: 1.27 mg/dL — ABNORMAL HIGH (ref 0.61–1.24)
GFR, Estimated: 52 mL/min — ABNORMAL LOW (ref >60)
Glucose, Bld: 94 mg/dL (ref 70–99)
Potassium: 4.3 mmol/L (ref 3.5–5.1)
Sodium: 141 mmol/L (ref 135–145)
Total Bilirubin: 0.4 mg/dL (ref 0.3–1.2)
Total Protein: 6.9 g/dL (ref 6.5–8.1)

## 2021-02-04 LAB — CBC WITH DIFFERENTIAL/PLATELET
Abs Immature Granulocytes: 0.03 10*3/uL (ref 0.00–0.07)
Basophils Absolute: 0 10*3/uL (ref 0.0–0.1)
Basophils Relative: 0 %
Eosinophils Absolute: 0.1 10*3/uL (ref 0.0–0.5)
Eosinophils Relative: 2 %
HCT: 33 % — ABNORMAL LOW (ref 39.0–52.0)
Hemoglobin: 10.4 g/dL — ABNORMAL LOW (ref 13.0–17.0)
Immature Granulocytes: 0 %
Lymphocytes Relative: 18 %
Lymphs Abs: 1.3 10*3/uL (ref 0.7–4.0)
MCH: 30.8 pg (ref 26.0–34.0)
MCHC: 31.5 g/dL (ref 30.0–36.0)
MCV: 97.6 fL (ref 80.0–100.0)
Monocytes Absolute: 0.5 10*3/uL (ref 0.1–1.0)
Monocytes Relative: 7 %
Neutro Abs: 5.3 10*3/uL (ref 1.7–7.7)
Neutrophils Relative %: 73 %
Platelets: 263 10*3/uL (ref 150–400)
RBC: 3.38 MIL/uL — ABNORMAL LOW (ref 4.22–5.81)
RDW: 14.1 % (ref 11.5–15.5)
WBC: 7.3 10*3/uL (ref 4.0–10.5)
nRBC: 0 % (ref 0.0–0.2)

## 2021-02-04 LAB — IRON AND TIBC
Iron: 88 ug/dL (ref 42–163)
Saturation Ratios: 35 % (ref 20–55)
TIBC: 253 ug/dL (ref 202–409)
UIBC: 165 ug/dL (ref 117–376)

## 2021-02-04 LAB — CEA (IN HOUSE-CHCC): CEA (CHCC-In House): 1.38 ng/mL (ref 0.00–5.00)

## 2021-02-04 LAB — LACTATE DEHYDROGENASE: LDH: 446 U/L — ABNORMAL HIGH (ref 98–192)

## 2021-02-04 LAB — FERRITIN: Ferritin: 262 ng/mL (ref 24–336)

## 2021-02-04 LAB — VITAMIN D 25 HYDROXY (VIT D DEFICIENCY, FRACTURES): Vit D, 25-Hydroxy: 83.54 ng/mL (ref 30–100)

## 2021-02-05 LAB — PSA, TOTAL AND FREE
PSA, Free Pct: >4.2 %
PSA, Free: >50 ng/mL
Prostate Specific Ag, Serum: 1203 ng/mL — ABNORMAL HIGH (ref 0.0–4.0)

## 2021-02-06 LAB — MULTIPLE MYELOMA PANEL, SERUM
Albumin SerPl Elph-Mcnc: 3.4 g/dL (ref 2.9–4.4)
Albumin/Glob SerPl: 1.4 (ref 0.7–1.7)
Alpha 1: 0.3 g/dL (ref 0.0–0.4)
Alpha2 Glob SerPl Elph-Mcnc: 0.9 g/dL (ref 0.4–1.0)
B-Globulin SerPl Elph-Mcnc: 0.8 g/dL (ref 0.7–1.3)
Gamma Glob SerPl Elph-Mcnc: 0.6 g/dL (ref 0.4–1.8)
Globulin, Total: 2.6 g/dL (ref 2.2–3.9)
IgA: 119 mg/dL (ref 61–437)
IgG (Immunoglobin G), Serum: 569 mg/dL — ABNORMAL LOW (ref 603–1613)
IgM (Immunoglobulin M), Srm: 32 mg/dL (ref 15–143)
Total Protein ELP: 6 g/dL (ref 6.0–8.5)

## 2021-02-11 ENCOUNTER — Ambulatory Visit (INDEPENDENT_AMBULATORY_CARE_PROVIDER_SITE_OTHER): Payer: Medicare PPO

## 2021-02-11 DIAGNOSIS — I495 Sick sinus syndrome: Secondary | ICD-10-CM | POA: Diagnosis not present

## 2021-02-11 LAB — CUP PACEART REMOTE DEVICE CHECK
Battery Remaining Longevity: 96 mo
Battery Remaining Percentage: 100 %
Brady Statistic RA Percent Paced: 46 %
Brady Statistic RV Percent Paced: 26 %
Date Time Interrogation Session: 20220214040100
Implantable Lead Implant Date: 20130926
Implantable Lead Implant Date: 20130926
Implantable Lead Location: 753859
Implantable Lead Location: 753860
Implantable Lead Model: 4456
Implantable Lead Model: 4476
Implantable Lead Serial Number: 473023
Implantable Lead Serial Number: 523746
Implantable Pulse Generator Implant Date: 20210513
Lead Channel Impedance Value: 338 Ohm
Lead Channel Impedance Value: 383 Ohm
Lead Channel Pacing Threshold Amplitude: 0.5 V
Lead Channel Pacing Threshold Pulse Width: 0.4 ms
Lead Channel Setting Pacing Amplitude: 2 V
Lead Channel Setting Pacing Amplitude: 2.5 V
Lead Channel Setting Pacing Pulse Width: 0.4 ms
Lead Channel Setting Sensing Sensitivity: 2.5 mV
Pulse Gen Serial Number: 528325

## 2021-02-14 ENCOUNTER — Other Ambulatory Visit: Payer: Self-pay

## 2021-02-14 ENCOUNTER — Ambulatory Visit (HOSPITAL_COMMUNITY)
Admission: RE | Admit: 2021-02-14 | Discharge: 2021-02-14 | Disposition: A | Discharge status: Home / Self Care | Payer: Medicare PPO | Source: Ambulatory Visit | Attending: Hematology | Admitting: Hematology

## 2021-02-14 DIAGNOSIS — C8290 Follicular lymphoma, unspecified, unspecified site: Secondary | ICD-10-CM | POA: Diagnosis not present

## 2021-02-14 DIAGNOSIS — C8296 Follicular lymphoma, unspecified, intrapelvic lymph nodes: Secondary | ICD-10-CM | POA: Diagnosis not present

## 2021-02-14 DIAGNOSIS — C7951 Secondary malignant neoplasm of bone: Secondary | ICD-10-CM | POA: Diagnosis not present

## 2021-02-14 LAB — GLUCOSE, CAPILLARY: Glucose-Capillary: 92 mg/dL (ref 70–99)

## 2021-02-14 MED ORDER — FLUDEOXYGLUCOSE F - 18 (FDG) INJECTION
9.2000 | Freq: Once | INTRAVENOUS | Status: AC
  Administered 2021-02-14: 13 via INTRAVENOUS

## 2021-02-17 NOTE — Progress Notes (Incomplete)
HEMATOLOGY/ONCOLOGY CONSULTATION NOTE  Date of Service: 02/17/2021  Patient Care Team: [name], MD as PCP - General (Family Medicine) [name], MD as PCP - Cardiology (Cardiology) [name], MD as PCP - Electrophysiology (Cardiology)  CHIEF COMPLAINTS/PURPOSE OF CONSULTATION:  Abnormal CBC/ Hx of Follicular Lymphoma  HISTORY OF PRESENTING ILLNESS:   Jacob Warner is a wonderful 87 y.o. male who has been referred to [place] by Dr. [name] for evaluation and management of abnormal CBC/history of follicular lymphoma. Pt is accompanied today by his son. The pt reports that he is doing well overall.   The pt reports that he was diagnosed with and treated for Follicular Lymphoma in 5638. He completed 5 months of Ritxuan, Cytoxan, and Prednisone. He has since been in remission.   A few months ago pt began having pain in his right-sided lower back. He found relief from this discomfort after he increased his water intake. The pain as since returned and remains despite the pt continuing to drink an adequate amount of water. Pt was referred to a Nephrologist after discussing his back pain with [name], his PCP. Pt has previously had kidney stones and has been told that he has BPH, but this has not disturbed his urinary habits. He denies seeing any blood in his urine, but was found to have hematuria on recent labs with [name]. Pt had an abdominal US in September, which showed a benign left kidney cyst. Pt has chronic back pain for which he receives steroid injections.    He has no history of thyroid disorders. Pt takes a baby Aspirin for his Afib and Pantoprazole daily. Pt has a history of dysphagia and has required esophageal dilation. He has lost nearly 40 lbs in the last six months. Pt has been eating less the last few months due to a decreased appetite and a change in routine. He also reports smaller stools in the last 6-8 months. His stools used to be round, but are now much  thinner. This is completely new and persistent.   Of note since the patient's last visit, pt has had [place] Abd ([id number]) completed on 09/04/2020 with results revealing "Stable 2.8 x 3.5 x 2.3 cm left superior pole simple renal cyst. Otherwise unremarkable upper abdominal ultrasound with the pancreas not visualized."  Most recent lab results (08/21/2020) of CBC w/diff and CMP is as follows: all values are WNL except for RBC at 3.05, Hgb at 9.9, HCT at 29.3, MCV at 96.0, Neutro Rel at 73.8, Lymphs Rel at 10.3, Lymphs Abs at 0.90K, Glucose at 103, BUN at 32, Creatinine at 1.46, GFR Est Non Af Am at 45, ALP at 140.  On review of systems, pt reports dysphagia, weight loss, low appetite, back pain, change in appearance of stools, neuropathy and denies nausea, abdominal pain, hematuria, fevers, chills, night sweats, new lumps/bumps, bloody stools, abdominal pain, testicular pain/swelling and any other symptoms.   On PMHx the pt reports Follicular Lymphoma, Cervical Spine Surgery, Spinal Cord Stimulator Implant, Afib, Pelvic fracture, Reflux, BPH, Kidney Stones.   INTERVAL HISTORY: Jacob Warner is a wonderful 87 y.o. male who is here for evaluation and management of abnormal CBC/history of follicular lymphoma. The patient's last visit with [place] was on 10/01/2020. The pt reports that he is doing well overall. We are seeing him back because he was having back pains and a CT Lumbar Spine ordered by his orthopedic doctor showed a chronic fracture and arthritis. There was  a bone lesion in left posterior ilium.   The pt reports that his back pain had worsened recently on his left side to where it made movement difficult. This has been going on for 5-6 months. The doctor felt that this lesion was completely new from the 2019 scan and this could possibly be causing the pain. They did an epidural steroid injection that helped alleviate the pt's pain. He is also on Hydrocodone for the pain.   The pt notes that  around one month ago his PCP noted that his iron levels were low and thus the pt is now taking Iron pills daily for the last 3-4 weeks. The pt notes a history of prostate issues.  On review of systems, pt reports lower left back pain, decreased urination and denies bloody stools, changes in urination, blood in urine, changes in bowel habits, new bone pain, rib pain, abdominal pain, decreased appetite, new skin nodules, leg swelling and any other symptoms.  INTERVAL HISTORY Jacob Warner is a wonderful 87 y.o. male who is here today for evaluation and management of abnormal CBC/history of follicular lymphoma. The patient's last visit with [place] was on 02/04/2021. The pt reports that he is doing well overall.  The pt reports ***  Of note since the patient's last visit, pt has had NM PET Whole Body ([id number]) on 02/14/2021, which revealed "1. Dominant finding is extensive hypermetabolic lymphoma involvement of the LEFT bony hemipelvis involving the acetabulum, ischium, and medial iliac bone. The intense metabolic activity is accompanied by extensive sclerosis and soft tissue extends to the musculature adjacent to the acetabulum. 2. Additional metastatic lesions in the inferior RIGHT ischium, lumbar spine, thoracic spine and cervical spine. Additional lesions in the scapula. 3. Fracture of the medial RIGHT ischium adjacent to the pubic symphysis without metabolic activity. 4. Metastatic iliac adenopathy. No evidence of metastatic adenopathy outside the pelvis. 5. Normal volume spleen."  Lab results today 02/19/2021 of CBC w/diff and CMP is as follows: all values are WNL except for ***  On review of systems, pt reports *** and denies *** and any other symptoms.   MEDICAL HISTORY:  Past Medical History:  Diagnosis Date  . A-fib ([place])   . Alopecia   . Anemia   . Bradycardia   . Cancer ([place])   . Cherry angioma   . Echocardiogram    Echo 11/19: Mild concentric LVH, EF 50-55, normal  wall motion, grade 1 diastolic dysfunction, mild AI, MAC, mild MR, normal RVSF, moderate TR, PASP 36  . GERD (gastroesophageal reflux disease)   . Glaucoma   . Hypertension   . Nuclear stress test    Nuclear stress test 11/19: not gated, inf and apical defect c/w small scar and/or soft tissue attenuation  . Pelvic fracture ([place])   . Right BBB/left ant fasc block   . Sick sinus syndrome ([place])   . TIA (transient ischemic attack)   . Xeroderma     SURGICAL HISTORY: Past Surgical History:  Procedure Laterality Date  . APPENDECTOMY    . CERVICAL SPINE SURGERY    . EXPLORATORY LAPAROTOMY    . LITHOTRIPSY    . PACEMAKER PLACEMENT    . PPM GENERATOR CHANGEOUT N/A 05/10/2020   Procedure: PPM GENERATOR CHANGEOUT;  Surgeon: [name], MD;  Location: [facility];  Service: Cardiovascular;  Laterality: N/A;  . SPINAL CORD STIMULATOR IMPLANT Bilateral 2018   [name], MD PhD, [facility]    SOCIAL  HISTORY: Social History   Socioeconomic History  . Marital status: Married    Spouse name: Not on file  . Number of children: Not on file  . Years of education: Not on file  . Highest education level: Not on file  Occupational History  . Not on file  Tobacco Use  . Smoking status: Never Smoker  . Smokeless tobacco: Never Used  Vaping Use  . Vaping Use: Never used  Substance and Sexual Activity  . Alcohol use: No    Alcohol/week: 0.0 standard drinks  . Drug use: No  . Sexual activity: Not on file  Other Topics Concern  . Not on file  Social History Narrative  . Not on file   Social Determinants of Health   Financial Resource Strain: Not on file  Food Insecurity: Not on file  Transportation Needs: Not on file  Physical Activity: Not on file  Stress: Not on file  Social Connections: Not on file  Intimate Partner Violence: Not on file    FAMILY HISTORY: Family History  Problem Relation Age of Onset  . Brain cancer Sister      ALLERGIES:  is allergic to sulfamethoxazole-trimethoprim, sulfa antibiotics, and sulfacetamide sodium.  MEDICATIONS:  Current Outpatient Medications  Medication Sig Dispense Refill  . acetaminophen (TYLENOL) 500 MG tablet Take 500-1,000 mg by mouth See admin instructions. 500 mg twice daily, 1000 mg at bedtime    . aspirin 81 MG tablet Take 81 mg by mouth daily. (Patient not taking: Reported on 02/04/2021)    . ASPIRIN 81 PO 1 tablet    . azelastine (ASTELIN) 0.1 % nasal spray Place 1 spray into both nostrils 2 (two) times daily as needed for rhinitis.     . calcium carbonate (TUMS) 500 MG chewable tablet 1 tablet    . cephALEXin (KEFLEX) 500 MG capsule 1 capsule    . denosumab (PROLIA) 60 MG/ML SOSY injection Inject 60 mg into the skin every 6 (six) months.    . dutasteride (AVODART) 0.5 MG capsule Take 0.5 mg by mouth daily.    . furosemide (LASIX) 40 MG tablet Take 40 mg by mouth daily.    Marland Kitchen gabapentin (NEURONTIN) 300 MG capsule Take 1 capsule (300 mg total) by mouth at bedtime. 90 capsule 1  . HYDROcodone-acetaminophen (NORCO/VICODIN) 5-325 MG tablet Take 1 tablet by mouth every 6 (six) hours as needed for moderate pain or severe pain. 60 tablet 0  . hydroxypropyl methylcellulose / hypromellose (ISOPTO TEARS / GONIOVISC) 2.5 % ophthalmic solution Place 1 drop into both eyes as needed for dry eyes.    Marland Kitchen ibuprofen (ADVIL) 200 MG tablet 1 tablet with food or milk as needed    . ketoconazole (NIZORAL) 2 % shampoo Apply 1 application topically 2 (two) times a week.     . lactulose (CHRONULAC) 10 GM/15ML solution Take 10 g by mouth daily.  0  . latanoprost (XALATAN) 0.005 % ophthalmic solution Place 1 drop into both eyes at bedtime.   11  . magnesium oxide (MAG-OX) 400 MG tablet 1 tablet    . Magnesium Oxide 400 (240 Mg) MG TABS Take 1 tablet by mouth 2 (two) times daily.  7  . meloxicam (MOBIC) 15 MG tablet Take 15 mg by mouth daily.    . montelukast (SINGULAIR) 10 MG tablet 1 tablet    .  nitroGLYCERIN (NITROSTAT) 0.4 MG SL tablet Place 1 tablet (0.4 mg total) under the tongue every 5 (five) minutes as needed for chest pain.  25 tablet 5  . pantoprazole (PROTONIX) 40 MG tablet Take 1 tablet by mouth daily.    . RESTASIS 0.05 % ophthalmic emulsion Place 1 drop into both eyes daily.   11  . ROCKLATAN 0.02-0.005 % SOLN     . topiramate (TOPAMAX) 25 MG tablet     . traZODone (DESYREL) 50 MG tablet Take 50 mg by mouth at bedtime.   11  . vitamin B-12 (CYANOCOBALAMIN) 1000 MCG tablet Take 1,000 mcg by mouth daily.     . White Petrolatum-Mineral Oil (EYE LUBRICANT) OINT Apply 1 strip to eye at bedtime.     No current facility-administered medications for this visit.    REVIEW OF SYSTEMS:   10 Point review of Systems was done is negative except as noted above.  PHYSICAL EXAMINATION: ECOG PERFORMANCE STATUS: 1 - Symptomatic but completely ambulatory  *** GENERAL:alert, in no acute distress and comfortable SKIN: no acute rashes, no significant lesions EYES: conjunctiva are pink and non-injected, sclera anicteric OROPHARYNX: MMM, no exudates, no oropharyngeal erythema or ulceration NECK: supple, no JVD LYMPH:  no palpable lymphadenopathy in the cervical, axillary or inguinal regions LUNGS: clear to auscultation b/l with normal respiratory effort HEART: regular rate & rhythm ABDOMEN:  normoactive bowel sounds , non tender, not distended. Extremity: no pedal edema PSYCH: alert & oriented x 3 with fluent speech NEURO: no focal motor/sensory deficits   LABORATORY DATA:  I have reviewed the data as listed  . CBC Latest Ref Rng & Units 02/04/2021 09/17/2020 09/17/2020  WBC 4.0 - 10.5 K/uL 7.3 7.4 -  Hemoglobin 13.0 - 17.0 g/dL 10.4(L) 11.3(L) -  Hematocrit 39.0 - 52.0 % 33.0(L) 34.1(L) 34.8(L)  Platelets 150 - 400 K/uL 263 204 -   . CBC    Component Value Date/Time   WBC 7.3 02/04/2021 1023   RBC 3.38 (L) 02/04/2021 1023   HGB 10.4 (L) 02/04/2021 1023   HGB 11.4 (L)  04/20/2020 1106   HGB 12.2 (L) 07/08/2017 1336   HCT 33.0 (L) 02/04/2021 1023   HCT 34.1 (L) 09/17/2020 1205   HCT 35.6 (L) 07/08/2017 1336   PLT 263 02/04/2021 1023   PLT 221 04/20/2020 1106   MCV 97.6 02/04/2021 1023   MCV 97 04/20/2020 1106   MCV 96.2 07/08/2017 1336   MCH 30.8 02/04/2021 1023   MCHC 31.5 02/04/2021 1023   RDW 14.1 02/04/2021 1023   RDW 13.2 04/20/2020 1106   RDW 13.9 07/08/2017 1336   LYMPHSABS 1.3 02/04/2021 1023   LYMPHSABS 1.3 04/20/2020 1106   LYMPHSABS 0.8 (L) 07/08/2017 1336   MONOABS 0.5 02/04/2021 1023   MONOABS 0.5 07/08/2017 1336   EOSABS 0.1 02/04/2021 1023   EOSABS 0.2 04/20/2020 1106   BASOSABS 0.0 02/04/2021 1023   BASOSABS 0.0 04/20/2020 1106   BASOSABS 0.0 07/08/2017 1336    . CMP Latest Ref Rng & Units 02/04/2021 09/17/2020 04/20/2020  Glucose 70 - 99 mg/dL 94 89 82  BUN 8 - 23 mg/dL 33(H) 31(H) 27  Creatinine 0.61 - 1.24 mg/dL 1.27(H) 1.43(H) 1.51(H)  Sodium 135 - 145 mmol/L 141 142 139  Potassium 3.5 - 5.1 mmol/L 4.3 4.2 4.5  Chloride 98 - 111 mmol/L 102 103 105  CO2 22 - 32 mmol/L 30 31 30(H)  Calcium 8.9 - 10.3 mg/dL 9.4 9.4 9.4  Total Protein 6.5 - 8.1 g/dL 6.9 6.8 -  Total Bilirubin 0.3 - 1.2 mg/dL 0.4 0.5 -  Alkaline Phos 38 - 126 U/L 291(H) 96 -  AST 15 - 41 U/L 22 22 -  ALT 0 - 44 U/L 9 17 -     RADIOGRAPHIC STUDIES: I have personally reviewed the radiological images as listed and agreed with the findings in the report. Epidural Steroid injection  Result Date: 01/28/2021 Magnus Sinning, MD     01/28/2021 11:57 AM Lumbosacral Transforaminal Epidural Steroid Injection - Sub-Pedicular Approach with Fluoroscopic Guidance Patient: MACAULEY MOSSBERG     Date of Birth: 05-08-1924 MRN: [id number] PCP: [name], MD     Visit Date: 01/28/2021  Universal Protocol:   Date/Time: 01/28/2021 Consent Given By: the patient Position: PRONE Additional Comments: Vital signs were monitored before and after the procedure. Patient was prepped and  draped in the usual sterile fashion. The correct patient, procedure, and site was verified. Injection Procedure Details: Procedure diagnoses: Lumbar radiculopathy [M54.16]  Meds Administered: Meds ordered this encounter Medications . HYDROcodone-acetaminophen (NORCO/VICODIN) 5-325 MG tablet   Sig: Take 1 tablet by mouth every 6 (six) hours as needed for moderate pain or severe pain.   Dispense:  60 tablet   Refill:  0 . methylPREDNISolone acetate (DEPO-MEDROL) injection 80 mg Laterality: Left Location/Site: L4-L5 Needle:5.0 in., 22 ga.  Short bevel or Quincke spinal needle Needle Placement: Transforaminal Findings:   -Comments: Excellent flow of contrast along the nerve, nerve root and into the epidural space. Procedure Details: After squaring off the end-plates to get a true AP view, the C-arm was positioned so that an oblique view of the foramen as noted above was visualized. The target area is just inferior to the "nose of the scotty dog" or sub pedicular. The soft tissues overlying this structure were infiltrated with 2-3 ml. of 1% Lidocaine without Epinephrine. The spinal needle was inserted toward the target using a "trajectory" view along the fluoroscope beam.  Under AP and lateral visualization, the needle was advanced so it did not puncture dura and was located close the 6 O'Clock position of the pedical in AP tracterory. Biplanar projections were used to confirm position. Aspiration was confirmed to be negative for CSF and/or blood. A 1-2 ml. volume of Isovue-250 was injected and flow of contrast was noted at each level. Radiographs were obtained for documentation purposes. After attaining the desired flow of contrast documented above, a 0.5 to 1.0 ml test dose of 0.25% Marcaine was injected into each respective transforaminal space.  The patient was observed for 90 seconds post injection.  After no sensory deficits were reported, and normal lower extremity motor function was noted,   the above injectate  was administered so that equal amounts of the injectate were placed at each foramen (level) into the transforaminal epidural space. Additional Comments: The patient tolerated the procedure well Dressing: 2 x 2 sterile gauze and Band-Aid  Post-procedure details: Patient was observed during the procedure. Post-procedure instructions were reviewed. Patient left the clinic in stable condition.   NM PET Image Initial (PI) Skull Base To Thigh  Result Date: 02/14/2021 CLINICAL DATA:  Initial treatment strategy for follicular lymphoma. Painful LEFT ilium. EXAM: NUCLEAR MEDICINE PET SKULL BASE TO THIGH TECHNIQUE: 13.0 mCi F-18 FDG was injected intravenously. Full-ring PET imaging was performed from the skull base to thigh after the radiotracer. CT data was obtained and used for attenuation correction and anatomic localization. Fasting blood glucose: 92 mg/dl COMPARISON:  None. FINDINGS: Mediastinal blood pool activity: SUV max 3.8 Liver activity: SUV max 4.8 NECK: No hypermetabolic lymph nodes in the neck. Incidental CT findings: None CHEST: No hypermetabolic mediastinal  or hilar nodes. 4 mm nodule in the RIGHT lower lobe (image 79/4) Incidental CT findings: Pacer in the RIGHT chest wall. ABDOMEN/PELVIS: No abnormal hypermetabolic activity within the liver, pancreas, adrenal glands, or spleen. Some thickening of tissue along the superior mesenteric artery. No significant hypermetabolic activity. Hypermetabolic LEFT external iliac lymph node measures 15 mm short axis with SUV max equal 6.1 (image 173). SKELETON: Extensive involvement of the LEFT acetabulum with intense metabolic activity (SUV max equal 6.0). There is extension into the adjacent musculature of the operator fossa with similar intense metabolic activity (image 235). There is associated sclerosis of the LEFT acetabulum which extends into the inferior pubic ramus and superior to the LEFT iliac bone. Posterior LEFT iliac bone with SUV max equal 7.7. Additional  scattered metastatic lesions within LEFT and RIGHT scapula. Intense metabolic activity associated with the C7 vertebral body on the RIGHT with SUV max equal 9.3. Additional metastatic lesions in the thoracic spine at T4-T5. Incidental CT findings: Fracture of the RIGHT ischium at the pubic symphysis (image 24/4. IMPRESSION: 1. Dominant finding is extensive hypermetabolic lymphoma involvement of the LEFT bony hemipelvis involving the acetabulum, ischium, and medial iliac bone. The intense metabolic activity is accompanied by extensive sclerosis and soft tissue extends to the musculature adjacent to the acetabulum. 2. Additional metastatic lesions in the inferior RIGHT ischium, lumbar spine, thoracic spine and cervical spine. Additional lesions in the scapula. 3. Fracture of the medial RIGHT ischium adjacent to the pubic symphysis without metabolic activity. 4. Metastatic iliac adenopathy. No evidence of metastatic adenopathy outside the pelvis. 5. Normal volume spleen. Electronically Signed   By: Suzy Bouchard M.D.   On: 02/14/2021 15:21   XR C-ARM NO REPORT  Result Date: 01/28/2021 Please see Notes tab for imaging impression.  CUP PACEART REMOTE DEVICE CHECK  Result Date: 02/11/2021 Scheduled remote reviewed. Normal device function.  Next remote 91 days. HB   ASSESSMENT & PLAN:   87 yo with   1) Mild macrocytic anemia 2) h/o follicular lymphoma in 5732 rx with R-CVP 3) Sclerotic bone lesion in the left posterior ilium PLAN: Labs today   PLAN: -Discussed pt ***   -Will see back in ***.  FOLLOW UP: ***  . No orders of the defined types were placed in this encounter.    The total time spent in the appointment was *** minutes and more than 50% was on counseling and direct patient cares.  All of the patient's questions were answered with apparent satisfaction. The patient knows to call the clinic with any problems, questions or concerns.  Sullivan Lone MD [place] AAHIVMS [facility]  [facility] Hematology/Oncology Physician [facility]  (Office):       [phone] (Work cell):  [phone] (Fax):           [phone]  02/17/2021 10:03 AM  I, Reinaldo Raddle, am acting as scribe for Dr. Sullivan Lone, MD.

## 2021-02-18 NOTE — Progress Notes (Signed)
Remote pacemaker transmission.   

## 2021-02-19 ENCOUNTER — Other Ambulatory Visit: Payer: Self-pay

## 2021-02-19 ENCOUNTER — Inpatient Hospital Stay: Payer: Medicare PPO | Admitting: Hematology

## 2021-02-19 ENCOUNTER — Encounter: Payer: Self-pay | Admitting: Hematology

## 2021-02-19 DIAGNOSIS — D539 Nutritional anemia, unspecified: Secondary | ICD-10-CM | POA: Diagnosis not present

## 2021-02-19 DIAGNOSIS — C61 Malignant neoplasm of prostate: Secondary | ICD-10-CM

## 2021-02-19 DIAGNOSIS — D649 Anemia, unspecified: Secondary | ICD-10-CM

## 2021-02-19 DIAGNOSIS — Z8572 Personal history of non-Hodgkin lymphomas: Secondary | ICD-10-CM | POA: Diagnosis not present

## 2021-02-19 DIAGNOSIS — I10 Essential (primary) hypertension: Secondary | ICD-10-CM | POA: Diagnosis not present

## 2021-02-19 DIAGNOSIS — C7951 Secondary malignant neoplasm of bone: Secondary | ICD-10-CM | POA: Diagnosis not present

## 2021-02-19 DIAGNOSIS — Z8673 Personal history of transient ischemic attack (TIA), and cerebral infarction without residual deficits: Secondary | ICD-10-CM | POA: Diagnosis not present

## 2021-02-19 DIAGNOSIS — M899 Disorder of bone, unspecified: Secondary | ICD-10-CM | POA: Diagnosis not present

## 2021-02-19 DIAGNOSIS — G893 Neoplasm related pain (acute) (chronic): Secondary | ICD-10-CM | POA: Diagnosis not present

## 2021-02-19 DIAGNOSIS — C8290 Follicular lymphoma, unspecified, unspecified site: Secondary | ICD-10-CM

## 2021-02-19 DIAGNOSIS — R972 Elevated prostate specific antigen [PSA]: Secondary | ICD-10-CM | POA: Diagnosis not present

## 2021-02-21 ENCOUNTER — Other Ambulatory Visit: Payer: Self-pay | Admitting: Hematology

## 2021-02-21 DIAGNOSIS — C61 Malignant neoplasm of prostate: Secondary | ICD-10-CM | POA: Insufficient documentation

## 2021-02-21 DIAGNOSIS — C7951 Secondary malignant neoplasm of bone: Secondary | ICD-10-CM

## 2021-02-22 ENCOUNTER — Inpatient Hospital Stay: Payer: Medicare PPO

## 2021-02-22 ENCOUNTER — Telehealth: Payer: Self-pay | Admitting: Hematology

## 2021-02-22 ENCOUNTER — Other Ambulatory Visit: Payer: Self-pay | Admitting: Hematology

## 2021-02-22 NOTE — Telephone Encounter (Signed)
Called and spoke with patient's son regarding upcoming appointments, patient is notified.

## 2021-02-25 ENCOUNTER — Telehealth: Payer: Self-pay

## 2021-02-25 ENCOUNTER — Other Ambulatory Visit: Payer: Medicare PPO

## 2021-02-25 ENCOUNTER — Telehealth: Payer: Self-pay | Admitting: *Deleted

## 2021-02-25 ENCOUNTER — Other Ambulatory Visit: Payer: Self-pay

## 2021-02-25 ENCOUNTER — Inpatient Hospital Stay: Payer: Medicare PPO

## 2021-02-25 VITALS — BP 112/55 | HR 80 | Temp 98.6°F | Resp 18

## 2021-02-25 DIAGNOSIS — Z8673 Personal history of transient ischemic attack (TIA), and cerebral infarction without residual deficits: Secondary | ICD-10-CM | POA: Diagnosis not present

## 2021-02-25 DIAGNOSIS — C61 Malignant neoplasm of prostate: Secondary | ICD-10-CM

## 2021-02-25 DIAGNOSIS — C7951 Secondary malignant neoplasm of bone: Secondary | ICD-10-CM | POA: Diagnosis not present

## 2021-02-25 DIAGNOSIS — Z8572 Personal history of non-Hodgkin lymphomas: Secondary | ICD-10-CM | POA: Diagnosis not present

## 2021-02-25 DIAGNOSIS — R972 Elevated prostate specific antigen [PSA]: Secondary | ICD-10-CM | POA: Diagnosis not present

## 2021-02-25 DIAGNOSIS — M899 Disorder of bone, unspecified: Secondary | ICD-10-CM | POA: Diagnosis not present

## 2021-02-25 DIAGNOSIS — G893 Neoplasm related pain (acute) (chronic): Secondary | ICD-10-CM | POA: Diagnosis not present

## 2021-02-25 DIAGNOSIS — D539 Nutritional anemia, unspecified: Secondary | ICD-10-CM | POA: Diagnosis not present

## 2021-02-25 DIAGNOSIS — I10 Essential (primary) hypertension: Secondary | ICD-10-CM | POA: Diagnosis not present

## 2021-02-25 MED ORDER — LEUPROLIDE ACETATE 7.5 MG ~~LOC~~ KIT
7.5000 mg | PACK | Freq: Once | SUBCUTANEOUS | Status: AC
  Administered 2021-02-25: 7.5 mg via SUBCUTANEOUS
  Filled 2021-02-25: qty 7.5

## 2021-02-25 NOTE — Telephone Encounter (Signed)
Lupron injection given only. Information on  Aredia given on AVS.Pt's son informed of information on AVS.

## 2021-02-25 NOTE — Patient Instructions (Addendum)
Leuprolide depot injection What is this medicine? LEUPROLIDE (loo PROE lide) is a man-made protein that acts like a natural hormone in the body. It decreases testosterone in men and decreases estrogen in women. In men, this medicine is used to treat advanced prostate cancer. In women, some forms of this medicine may be used to treat endometriosis, uterine fibroids, or other male hormone-related problems. This medicine may be used for other purposes; ask your health care provider or pharmacist if you have questions. COMMON BRAND NAME(S): Eligard, Fensolv, Lupron Depot, Lupron Depot-Ped, Viadur What should I tell my health care provider before I take this medicine? They need to know if you have any of these conditions:  diabetes  heart disease or previous heart attack  high blood pressure  high cholesterol  mental illness  osteoporosis  pain or difficulty passing urine  seizures  spinal cord metastasis  stroke  suicidal thoughts, plans, or attempt; a previous suicide attempt by you or a family member  tobacco smoker  unusual vaginal bleeding (women)  an unusual or allergic reaction to leuprolide, benzyl alcohol, other medicines, foods, dyes, or preservatives  pregnant or trying to get pregnant  breast-feeding How should I use this medicine? This medicine is for injection into a muscle or for injection under the skin. It is given by a health care professional in a hospital or clinic setting. The specific product will determine how it will be given to you. Make sure you understand which product you receive and how often you will receive it. Talk to your pediatrician regarding the use of this medicine in children. Special care may be needed. Overdosage: If you think you have taken too much of this medicine contact a poison control center or emergency room at once. NOTE: This medicine is only for you. Do not share this medicine with others. What if I miss a dose? It is  important not to miss a dose. Call your doctor or health care professional if you are unable to keep an appointment. Depot injections: Depot injections are given either once-monthly, every 12 weeks, every 16 weeks, or every 24 weeks depending on the product you are prescribed. The product you are prescribed will be based on if you are male or male, and your condition. Make sure you understand your product and dosing. What may interact with this medicine? Do not take this medicine with any of the following medications:  chasteberry  cisapride  dronedarone  pimozide  thioridazine This medicine may also interact with the following medications:  herbal or dietary supplements, like black cohosh or DHEA  male hormones, like estrogens or progestins and birth control pills, patches, rings, or injections  male hormones, like testosterone  other medicines that prolong the QT interval (abnormal heart rhythm) This list may not describe all possible interactions. Give your health care provider a list of all the medicines, herbs, non-prescription drugs, or dietary supplements you use. Also tell them if you smoke, drink alcohol, or use illegal drugs. Some items may interact with your medicine. What should I watch for while using this medicine? Visit your doctor or health care professional for regular checks on your progress. During the first weeks of treatment, your symptoms may get worse, but then will improve as you continue your treatment. You may get hot flashes, increased bone pain, increased difficulty passing urine, or an aggravation of nerve symptoms. Discuss these effects with your doctor or health care professional, some of them may improve with continued use of this  medicine. Male patients may experience a menstrual cycle or spotting during the first months of therapy with this medicine. If this continues, contact your doctor or health care professional. This medicine may increase blood  sugar. Ask your healthcare provider if changes in diet or medicines are needed if you have diabetes. What side effects may I notice from receiving this medicine? Side effects that you should report to your doctor or health care professional as soon as possible:  allergic reactions like skin rash, itching or hives, swelling of the face, lips, or tongue  breathing problems  chest pain  depression or memory disorders  pain in your legs or groin  pain at site where injected or implanted  seizures  severe headache  signs and symptoms of high blood sugar such as being more thirsty or hungry or having to urinate more than normal. You may also feel very tired or have blurry vision  swelling of the feet and legs  suicidal thoughts or other mood changes  visual changes  vomiting Side effects that usually do not require medical attention (report to your doctor or health care professional if they continue or are bothersome):  breast swelling or tenderness  decrease in sex drive or performance  diarrhea  hot flashes  loss of appetite  muscle, joint, or bone pains  nausea  redness or irritation at site where injected or implanted  skin problems or acne This list may not describe all possible side effects. Call your doctor for medical advice about side effects. You may report side effects to FDA at 1-800-FDA-1088. Where should I keep my medicine? This drug is given in a hospital or clinic and will not be stored at home. NOTE: This sheet is a summary. It may not cover all possible information. If you have questions about this medicine, talk to your doctor, pharmacist, or health care provider.  2021 [employer] Standard (2019-11-16 10:35:13) Pamidronate Injection What is this medicine? PAMIDRONATE (pa mi DROE nate) slows calcium loss from bones. It treats Paget's disease and high calcium levels in the blood from some kinds of cancer. It may be used in other people at risk for  bone loss. This medicine may be used for other purposes; ask your health care provider or pharmacist if you have questions. COMMON BRAND NAME(S): Aredia What should I tell my health care provider before I take this medicine? They need to know if you have any of these conditions:  bleeding disorder  cancer  dental disease  kidney disease  low levels of calcium or other minerals in the blood  low red blood cell counts  receiving steroids like dexamethasone or prednisone  an unusual or allergic reaction to pamidronate, other drugs, foods, dyes or preservatives  pregnant or trying to get pregnant  breast-feeding How should I use this medicine? This drug is injected into a vein. It is given by a health care provider in a hospital or clinic setting. Talk to your health care provider about the use of this drug in children. Special care may be needed. Overdosage: If you think you have taken too much of this medicine contact a poison control center or emergency room at once. NOTE: This medicine is only for you. Do not share this medicine with others. What if I miss a dose? Keep appointments for follow-up doses. It is important not to miss your dose. Call your health care provider if you are unable to keep an appointment. What may interact with this medicine?  certain  antibiotics given by injection  medicines for inflammation or pain like ibuprofen, naproxen  some diuretics like bumetanide, furosemide  cyclosporine  parathyroid hormone  tacrolimus  teriparatide  thalidomide This list may not describe all possible interactions. Give your health care provider a list of all the medicines, herbs, non-prescription drugs, or dietary supplements you use. Also tell them if you smoke, drink alcohol, or use illegal drugs. Some items may interact with your medicine. What should I watch for while using this medicine? Visit your health care provider for regular checks on your progress. It  may be some time before you see the benefit from this drug. Some people who take this drug have severe bone, joint, or muscle pain. This drug may also increase your risk for jaw problems or a broken thigh bone. Tell your health care provider right away if you have severe pain in your jaw, bones, joints, or muscles. Tell you health care provider if you have any pain that does not go away or that gets worse. Tell your dentist and dental surgeon that you are taking this drug. You should not have major dental surgery while on this drug. See your dentist to have a dental exam and fix any dental problems before starting this drug. Take good care of your teeth while on this drug. Make sure you see your dentist for regular follow-up appointments. You should make sure you get enough calcium and vitamin D while you are taking this drug. Discuss the foods you eat and the vitamins you take with your health care provider. You may need blood work done while you are taking this drug. Do not become pregnant while taking this drug. Women should inform their health care provider if they wish to become pregnant or think they might be pregnant. There is potential for serious harm to an unborn child. Talk to your health care provider for more information. What side effects may I notice from receiving this medicine? Side effects that you should report to your doctor or health care provider as soon as possible:  allergic reactions (skin rash, itching or hives; swelling of the face, lips, or tongue)  bleeding (bloody or black, tarry stools; red or dark brown urine; spitting up blood or brown material that looks like coffee grounds; red spots on the skin; unusual bruising or bleeding from the eyes, gums, or nose)  bone pain  increased thirst  infection (fever, chills, cough, sore throat, pain or trouble passing urine)  jaw pain, especially after dental work  joint pain  kidney injury (trouble passing urine or change  in the amount of urine)  low calcium levels (fast heartbeat; muscle cramps or pain; pain, tingling, or numbness in the hands or feet; seizures)  low magnesium levels (fast, irregular heartbeat; muscle cramp or pain; muscle weakness; tremors; seizures)  low potassium levels (trouble breathing; chest pain; dizziness; fast, irregular heartbeat; feeling faint or lightheaded, falls; muscle cramps or pain)  muscle pain  pain, redness, or irritation at site where injected  redness, blistering, peeling, or loosening of the skin, including inside the mouth  severe diarrhea  unusual sweating Side effects that usually do not require medical attention (report to your doctor or health care provider if they continue or are bothersome):  constipation  eye irritation, itching, or pain  fever  headache  increase in blood pressure  loss of appetite  nausea  stomach pain  unusually weak or tired  vomiting This list may not describe all possible side effects.  Call your doctor for medical advice about side effects. You may report side effects to FDA at 1-800-FDA-1088. Where should I keep my medicine? This drug is given in a hospital or clinic. It will not be stored at home. NOTE: This sheet is a summary. It may not cover all possible information. If you have questions about this medicine, talk to your doctor, pharmacist, or health care provider.  2021 [employer] Standard (2019-11-07 12:19:52)

## 2021-02-25 NOTE — Telephone Encounter (Signed)
Contacted patient's son to inform that patient is getting Lupron ONLY today 2/28. Informed him that [place] not auth'd by insurance, patient will not receive it today. Aredia will be substituted, as it will provide the same benefit per Dr. Irene Limbo. Arecia needs a 3 hour infusion appt infusion appt  and will be scheduled as soon as possible, once medication is authorized by insurance. Patient son verbalized understanding.

## 2021-02-26 ENCOUNTER — Ambulatory Visit
Admission: RE | Admit: 2021-02-26 | Discharge: 2021-02-26 | Disposition: A | Discharge status: Home / Self Care | Payer: Medicare PPO | Source: Ambulatory Visit | Attending: Radiation Oncology | Admitting: Radiation Oncology

## 2021-02-26 ENCOUNTER — Encounter: Payer: Self-pay | Admitting: Radiation Oncology

## 2021-02-26 ENCOUNTER — Telehealth: Payer: Self-pay | Admitting: Hematology

## 2021-02-26 DIAGNOSIS — C7951 Secondary malignant neoplasm of bone: Secondary | ICD-10-CM

## 2021-02-26 DIAGNOSIS — C61 Malignant neoplasm of prostate: Secondary | ICD-10-CM

## 2021-02-26 DIAGNOSIS — C7952 Secondary malignant neoplasm of bone marrow: Secondary | ICD-10-CM

## 2021-02-26 NOTE — Progress Notes (Signed)
HEMATOLOGY/ONCOLOGY CONSULTATION NOTE  Date of Service: 02/17/2021  Patient Care Team: [name], MD as PCP - General (Family Medicine) [name], MD as PCP - Cardiology (Cardiology) [name], MD as PCP - Electrophysiology (Cardiology)  CHIEF COMPLAINTS/PURPOSE OF CONSULTATION:  F/u for newly diagnosed prostate cancer with bone metastases Hx of Follicular Lymphoma  HISTORY OF PRESENTING ILLNESS:   Jacob Warner is a wonderful 87 y.o. male who has been referred to [place] by Dr. [name] for evaluation and management of abnormal CBC/history of follicular lymphoma. Pt is accompanied today by his son. The pt reports that he is doing well overall.   The pt reports that he was diagnosed with and treated for Follicular Lymphoma in 2426. He completed 5 months of Ritxuan, Cytoxan, and Prednisone. He has since been in remission.   A few months ago pt began having pain in his right-sided lower back. He found relief from this discomfort after he increased his water intake. The pain as since returned and remains despite the pt continuing to drink an adequate amount of water. Pt was referred to a Nephrologist after discussing his back pain with [name], his PCP. Pt has previously had kidney stones and has been told that he has BPH, but this has not disturbed his urinary habits. He denies seeing any blood in his urine, but was found to have hematuria on recent labs with [name]. Pt had an abdominal US in September, which showed a benign left kidney cyst. Pt has chronic back pain for which he receives steroid injections.    He has no history of thyroid disorders. Pt takes a baby Aspirin for his Afib and Pantoprazole daily. Pt has a history of dysphagia and has required esophageal dilation. He has lost nearly 40 lbs in the last six months. Pt has been eating less the last few months due to a decreased appetite and a change in routine. He also reports smaller stools in the last 6-8  months. His stools used to be round, but are now much thinner. This is completely new and persistent.   Of note since the patient's last visit, pt has had [place] Abd ([id number]) completed on 09/04/2020 with results revealing "Stable 2.8 x 3.5 x 2.3 cm left superior pole simple renal cyst. Otherwise unremarkable upper abdominal ultrasound with the pancreas not visualized."  Most recent lab results (08/21/2020) of CBC w/diff and CMP is as follows: all values are WNL except for RBC at 3.05, Hgb at 9.9, HCT at 29.3, MCV at 96.0, Neutro Rel at 73.8, Lymphs Rel at 10.3, Lymphs Abs at 0.90K, Glucose at 103, BUN at 32, Creatinine at 1.46, GFR Est Non Af Am at 45, ALP at 140.  On review of systems, pt reports dysphagia, weight loss, low appetite, back pain, change in appearance of stools, neuropathy and denies nausea, abdominal pain, hematuria, fevers, chills, night sweats, new lumps/bumps, bloody stools, abdominal pain, testicular pain/swelling and any other symptoms.   On PMHx the pt reports Follicular Lymphoma, Cervical Spine Surgery, Spinal Cord Stimulator Implant, Afib, Pelvic fracture, Reflux, BPH, Kidney Stones.    INTERVAL HISTORY:   Jacob Warner is a wonderful 87 y.o. male who is here for evaluation and management of abnormal CBC/history of follicular lymphoma. The patient's last visit with [place] was on 10/01/2020. The pt reports that he is doing well overall. We are seeing him back because he was having back pains and a CT Lumbar Spine ordered by his  orthopedic doctor showed a chronic fracture and arthritis. There was a bone lesion in left posterior ilium.   The pt reports that his back pain had worsened recently on his left side to where it made movement difficult. This has been going on for 5-6 months. The doctor felt that this lesion was completely new from the 2019 scan and this could possibly be causing the pain. They did an epidural steroid injection that helped alleviate the pt's pain. He is  also on Hydrocodone for the pain.   The pt notes that around one month ago his PCP noted that his iron levels were low and thus the pt is now taking Iron pills daily for the last 3-4 weeks. The pt notes a history of prostate issues.  On review of systems, pt reports lower left back pain, decreased urination and denies bloody stools, changes in urination, blood in urine, changes in bowel habits, new bone pain, rib pain, abdominal pain, decreased appetite, new skin nodules, leg swelling and any other symptoms.  INTERVAL HISTORY (.02/19/2021)  Jacob Warner is a wonderful 87 y.o. male who is here today for evaluation and management of abnormal CBC/history of follicular lymphoma and new metastatic bone disease likely from metastatic prostate cancer. The patient's last visit with [place] was on 02/04/2021.   The pt reports back pain and left hip pain.  Of note since the patient's last visit, pt has had NM PET Whole Body ([id number]) on 02/14/2021, which revealed "1. Dominant finding is extensive hypermetabolic lymphoma involvement of the LEFT bony hemipelvis involving the acetabulum, ischium, and medial iliac bone. The intense metabolic activity is accompanied by extensive sclerosis and soft tissue extends to the musculature adjacent to the acetabulum. 2. Additional metastatic lesions in the inferior RIGHT ischium, lumbar spine, thoracic spine and cervical spine. Additional lesions in the scapula. 3. Fracture of the medial RIGHT ischium adjacent to the pubic symphysis without metabolic activity.4. Metastatic iliac adenopathy. No evidence of metastatic adenopathy outside the pelvis. 5. Normal volume spleen."  Lab results reviewed with patient and his son.  On review of systems, pt reports some urinary frequency- no overt hematuria or other symptoms.   MEDICAL HISTORY:  Past Medical History:  Diagnosis Date  . A-fib ([place])   . Alopecia   . Anemia   . Bradycardia   . Cancer ([place])   . Cherry angioma    . Echocardiogram    Echo 11/19: Mild concentric LVH, EF 50-55, normal wall motion, grade 1 diastolic dysfunction, mild AI, MAC, mild MR, normal RVSF, moderate TR, PASP 36  . GERD (gastroesophageal reflux disease)   . Glaucoma   . Hypertension   . Nuclear stress test    Nuclear stress test 11/19: not gated, inf and apical defect c/w small scar and/or soft tissue attenuation  . Pelvic fracture ([place])   . Right BBB/left ant fasc block   . Sick sinus syndrome ([place])   . TIA (transient ischemic attack)   . Xeroderma     SURGICAL HISTORY: Past Surgical History:  Procedure Laterality Date  . APPENDECTOMY    . CERVICAL SPINE SURGERY    . EXPLORATORY LAPAROTOMY    . LITHOTRIPSY    . PACEMAKER PLACEMENT    . PPM GENERATOR CHANGEOUT N/A 05/10/2020   Procedure: PPM GENERATOR CHANGEOUT;  Surgeon: [name], MD;  Location: [facility];  Service: Cardiovascular;  Laterality: N/A;  . SPINAL CORD STIMULATOR IMPLANT Bilateral 2018   [name], MD PhD, [place]  [facility]    SOCIAL HISTORY: Social History   Socioeconomic History  . Marital status: Married    Spouse name: Not on file  . Number of children: Not on file  . Years of education: Not on file  . Highest education level: Not on file  Occupational History  . Not on file  Tobacco Use  . Smoking status: Never Smoker  . Smokeless tobacco: Never Used  Vaping Use  . Vaping Use: Never used  Substance and Sexual Activity  . Alcohol use: No    Alcohol/week: 0.0 standard drinks  . Drug use: No  . Sexual activity: Not on file  Other Topics Concern  . Not on file  Social History Narrative  . Not on file   Social Determinants of Health   Financial Resource Strain: Not on file  Food Insecurity: Not on file  Transportation Needs: Not on file  Physical Activity: Not on file  Stress: Not on file  Social Connections: Not on file  Intimate Partner Violence: Not on file    FAMILY  HISTORY: Family History  Problem Relation Age of Onset  . Brain cancer Sister     ALLERGIES:  is allergic to sulfamethoxazole-trimethoprim, sulfa antibiotics, and sulfacetamide sodium.  MEDICATIONS:  Current Outpatient Medications  Medication Sig Dispense Refill  . acetaminophen (TYLENOL) 500 MG tablet Take 500-1,000 mg by mouth See admin instructions. 500 mg twice daily, 1000 mg at bedtime    . aspirin 81 MG tablet Take 81 mg by mouth daily. (Patient not taking: Reported on 02/04/2021)    . ASPIRIN 81 PO 1 tablet    . azelastine (ASTELIN) 0.1 % nasal spray Place 1 spray into both nostrils 2 (two) times daily as needed for rhinitis.     . calcium carbonate (TUMS) 500 MG chewable tablet 1 tablet    . cephALEXin (KEFLEX) 500 MG capsule 1 capsule    . denosumab (PROLIA) 60 MG/ML SOSY injection Inject 60 mg into the skin every 6 (six) months.    . dutasteride (AVODART) 0.5 MG capsule Take 0.5 mg by mouth daily.    . furosemide (LASIX) 40 MG tablet Take 40 mg by mouth daily.    Marland Kitchen gabapentin (NEURONTIN) 300 MG capsule Take 1 capsule (300 mg total) by mouth at bedtime. 90 capsule 1  . HYDROcodone-acetaminophen (NORCO/VICODIN) 5-325 MG tablet Take 1 tablet by mouth every 6 (six) hours as needed for moderate pain or severe pain. 60 tablet 0  . hydroxypropyl methylcellulose / hypromellose (ISOPTO TEARS / GONIOVISC) 2.5 % ophthalmic solution Place 1 drop into both eyes as needed for dry eyes.    Marland Kitchen ibuprofen (ADVIL) 200 MG tablet 1 tablet with food or milk as needed    . ketoconazole (NIZORAL) 2 % shampoo Apply 1 application topically 2 (two) times a week.     . lactulose (CHRONULAC) 10 GM/15ML solution Take 10 g by mouth daily.  0  . latanoprost (XALATAN) 0.005 % ophthalmic solution Place 1 drop into both eyes at bedtime.   11  . magnesium oxide (MAG-OX) 400 MG tablet 1 tablet    . Magnesium Oxide 400 (240 Mg) MG TABS Take 1 tablet by mouth 2 (two) times daily.  7  . meloxicam (MOBIC) 15 MG tablet  Take 15 mg by mouth daily.    . montelukast (SINGULAIR) 10 MG tablet 1 tablet    . nitroGLYCERIN (NITROSTAT) 0.4 MG SL tablet Place 1 tablet (0.4 mg total) under the tongue every  5 (five) minutes as needed for chest pain. 25 tablet 5  . pantoprazole (PROTONIX) 40 MG tablet Take 1 tablet by mouth daily.    . RESTASIS 0.05 % ophthalmic emulsion Place 1 drop into both eyes daily.   11  . ROCKLATAN 0.02-0.005 % SOLN     . topiramate (TOPAMAX) 25 MG tablet     . traZODone (DESYREL) 50 MG tablet Take 50 mg by mouth at bedtime.   11  . vitamin B-12 (CYANOCOBALAMIN) 1000 MCG tablet Take 1,000 mcg by mouth daily.     . White Petrolatum-Mineral Oil (EYE LUBRICANT) OINT Apply 1 strip to eye at bedtime.     No current facility-administered medications for this visit.    REVIEW OF SYSTEMS:   10 Point review of Systems was done is negative except as noted above.  PHYSICAL EXAMINATION: ECOG PERFORMANCE STATUS: 1 - Symptomatic but completely ambulatory  NAD GENERAL:alert, in no acute distress and comfortable SKIN: no acute rashes, no significant lesions EYES: conjunctiva are pink and non-injected, sclera anicteric OROPHARYNX: MMM, no exudates, no oropharyngeal erythema or ulceration NECK: supple, no JVD LYMPH:  no palpable lymphadenopathy in the cervical, axillary or inguinal regions LUNGS: clear to auscultation b/l with normal respiratory effort HEART: regular rate & rhythm ABDOMEN:  normoactive bowel sounds , non tender, not distended. Extremity: no pedal edema PSYCH: alert & oriented x 3 with fluent speech NEURO: no focal motor/sensory deficits   LABORATORY DATA:  I have reviewed the data as listed  . CBC Latest Ref Rng & Units 02/04/2021 09/17/2020 09/17/2020  WBC 4.0 - 10.5 K/uL 7.3 7.4 -  Hemoglobin 13.0 - 17.0 g/dL 10.4(L) 11.3(L) -  Hematocrit 39.0 - 52.0 % 33.0(L) 34.1(L) 34.8(L)  Platelets 150 - 400 K/uL 263 204 -   . CBC    Component Value Date/Time   WBC 7.3 02/04/2021 1023    RBC 3.38 (L) 02/04/2021 1023   HGB 10.4 (L) 02/04/2021 1023   HGB 11.4 (L) 04/20/2020 1106   HGB 12.2 (L) 07/08/2017 1336   HCT 33.0 (L) 02/04/2021 1023   HCT 34.1 (L) 09/17/2020 1205   HCT 35.6 (L) 07/08/2017 1336   PLT 263 02/04/2021 1023   PLT 221 04/20/2020 1106   MCV 97.6 02/04/2021 1023   MCV 97 04/20/2020 1106   MCV 96.2 07/08/2017 1336   MCH 30.8 02/04/2021 1023   MCHC 31.5 02/04/2021 1023   RDW 14.1 02/04/2021 1023   RDW 13.2 04/20/2020 1106   RDW 13.9 07/08/2017 1336   LYMPHSABS 1.3 02/04/2021 1023   LYMPHSABS 1.3 04/20/2020 1106   LYMPHSABS 0.8 (L) 07/08/2017 1336   MONOABS 0.5 02/04/2021 1023   MONOABS 0.5 07/08/2017 1336   EOSABS 0.1 02/04/2021 1023   EOSABS 0.2 04/20/2020 1106   BASOSABS 0.0 02/04/2021 1023   BASOSABS 0.0 04/20/2020 1106   BASOSABS 0.0 07/08/2017 1336    . CMP Latest Ref Rng & Units 02/04/2021 09/17/2020 04/20/2020  Glucose 70 - 99 mg/dL 94 89 82  BUN 8 - 23 mg/dL 33(H) 31(H) 27  Creatinine 0.61 - 1.24 mg/dL 1.27(H) 1.43(H) 1.51(H)  Sodium 135 - 145 mmol/L 141 142 139  Potassium 3.5 - 5.1 mmol/L 4.3 4.2 4.5  Chloride 98 - 111 mmol/L 102 103 105  CO2 22 - 32 mmol/L 30 31 30(H)  Calcium 8.9 - 10.3 mg/dL 9.4 9.4 9.4  Total Protein 6.5 - 8.1 g/dL 6.9 6.8 -  Total Bilirubin 0.3 - 1.2 mg/dL 0.4 0.5 -  Alkaline Phos  38 - 126 U/L 291(H) 96 -  AST 15 - 41 U/L 22 22 -  ALT 0 - 44 U/L 9 17 -   Component     Latest Ref Rng & Units 02/04/2021  IgG (Immunoglobin G), Serum     603 - 1,613 mg/dL 569 (L)  IgA     61 - 437 mg/dL 119  IgM (Immunoglobulin M), Srm     15 - 143 mg/dL 32  Total Protein ELP     6.0 - 8.5 g/dL 6.0  Albumin SerPl Elph-Mcnc     2.9 - 4.4 g/dL 3.4  Alpha 1     0.0 - 0.4 g/dL 0.3  Alpha2 Glob SerPl Elph-Mcnc     0.4 - 1.0 g/dL 0.9  B-Globulin SerPl Elph-Mcnc     0.7 - 1.3 g/dL 0.8  Gamma Glob SerPl Elph-Mcnc     0.4 - 1.8 g/dL 0.6  M Protein SerPl Elph-Mcnc     Not Observed g/dL Not Observed  Globulin, Total     2.2  - 3.9 g/dL 2.6  Albumin/Glob SerPl     0.7 - 1.7 1.4  IFE 1      Comment  Please Note (HCV):      Comment  Iron     42 - 163 ug/dL 88  TIBC     202 - 409 ug/dL 253  Saturation Ratios     20 - 55 % 35  UIBC     117 - 376 ug/dL 165  PSA, Free     N/A ng/mL >50.00  PSA, Free Pct     % >4.2  Prostate Specific Ag, Serum     0.0 - 4.0 ng/mL 1,203.0 (H)  Vitamin D, 25-Hydroxy     30 - 100 ng/mL 83.54  Ferritin     24 - 336 ng/mL 262  CEA ([facility]In [facility])     0.00 - 5.00 ng/mL 1.38  LDH     98 - 192 U/L 446 (H)    RADIOGRAPHIC STUDIES: I have personally reviewed the radiological images as listed and agreed with the findings in the report. Epidural Steroid injection  Result Date: 01/28/2021 Magnus Sinning, MD     01/28/2021 11:57 AM Lumbosacral Transforaminal Epidural Steroid Injection - Sub-Pedicular Approach with Fluoroscopic Guidance Patient: CHRISTOHPER DUBE     Date of Birth: [birth year] MRN: [id number] PCP: [name], MD     Visit Date: 01/28/2021  Universal Protocol:   Date/Time: 01/28/2021 Consent Given By: the patient Position: PRONE Additional Comments: Vital signs were monitored before and after the procedure. Patient was prepped and draped in the usual sterile fashion. The correct patient, procedure, and site was verified. Injection Procedure Details: Procedure diagnoses: Lumbar radiculopathy [M54.16]  Meds Administered: Meds ordered this encounter Medications . HYDROcodone-acetaminophen (NORCO/VICODIN) 5-325 MG tablet   Sig: Take 1 tablet by mouth every 6 (six) hours as needed for moderate pain or severe pain.   Dispense:  60 tablet   Refill:  0 . methylPREDNISolone acetate (DEPO-MEDROL) injection 80 mg Laterality: Left Location/Site: L4-L5 Needle:5.0 in., 22 ga.  Short bevel or Quincke spinal needle Needle Placement: Transforaminal Findings:   -Comments: Excellent flow of contrast along the nerve, nerve root and into the epidural space. Procedure Details: After squaring off  the end-plates to get a true AP view, the C-arm was positioned so that an oblique view of the foramen as noted above was visualized. The target area is just inferior to the "nose of the scotty  dog" or sub pedicular. The soft tissues overlying this structure were infiltrated with 2-3 ml. of 1% Lidocaine without Epinephrine. The spinal needle was inserted toward the target using a "trajectory" view along the fluoroscope beam.  Under AP and lateral visualization, the needle was advanced so it did not puncture dura and was located close the 6 O'Clock position of the pedical in AP tracterory. Biplanar projections were used to confirm position. Aspiration was confirmed to be negative for CSF and/or blood. A 1-2 ml. volume of Isovue-250 was injected and flow of contrast was noted at each level. Radiographs were obtained for documentation purposes. After attaining the desired flow of contrast documented above, a 0.5 to 1.0 ml test dose of 0.25% Marcaine was injected into each respective transforaminal space.  The patient was observed for 90 seconds post injection.  After no sensory deficits were reported, and normal lower extremity motor function was noted,   the above injectate was administered so that equal amounts of the injectate were placed at each foramen (level) into the transforaminal epidural space. Additional Comments: The patient tolerated the procedure well Dressing: 2 x 2 sterile gauze and Band-Aid  Post-procedure details: Patient was observed during the procedure. Post-procedure instructions were reviewed. Patient left the clinic in stable condition.   NM PET Image Initial (PI) Skull Base To Thigh  Result Date: 02/14/2021 CLINICAL DATA:  Initial treatment strategy for follicular lymphoma. Painful LEFT ilium. EXAM: NUCLEAR MEDICINE PET SKULL BASE TO THIGH TECHNIQUE: 13.0 mCi F-18 FDG was injected intravenously. Full-ring PET imaging was performed from the skull base to thigh after the radiotracer. CT data  was obtained and used for attenuation correction and anatomic localization. Fasting blood glucose: 92 mg/dl COMPARISON:  None. FINDINGS: Mediastinal blood pool activity: SUV max 3.8 Liver activity: SUV max 4.8 NECK: No hypermetabolic lymph nodes in the neck. Incidental CT findings: None CHEST: No hypermetabolic mediastinal or hilar nodes. 4 mm nodule in the RIGHT lower lobe (image 79/4) Incidental CT findings: Pacer in the RIGHT chest wall. ABDOMEN/PELVIS: No abnormal hypermetabolic activity within the liver, pancreas, adrenal glands, or spleen. Some thickening of tissue along the superior mesenteric artery. No significant hypermetabolic activity. Hypermetabolic LEFT external iliac lymph node measures 15 mm short axis with SUV max equal 6.1 (image 173). SKELETON: Extensive involvement of the LEFT acetabulum with intense metabolic activity (SUV max equal 6.0). There is extension into the adjacent musculature of the operator fossa with similar intense metabolic activity (image 563). There is associated sclerosis of the LEFT acetabulum which extends into the inferior pubic ramus and superior to the LEFT iliac bone. Posterior LEFT iliac bone with SUV max equal 7.7. Additional scattered metastatic lesions within LEFT and RIGHT scapula. Intense metabolic activity associated with the C7 vertebral body on the RIGHT with SUV max equal 9.3. Additional metastatic lesions in the thoracic spine at T4-T5. Incidental CT findings: Fracture of the RIGHT ischium at the pubic symphysis (image 24/4. IMPRESSION: 1. Dominant finding is extensive hypermetabolic lymphoma involvement of the LEFT bony hemipelvis involving the acetabulum, ischium, and medial iliac bone. The intense metabolic activity is accompanied by extensive sclerosis and soft tissue extends to the musculature adjacent to the acetabulum. 2. Additional metastatic lesions in the inferior RIGHT ischium, lumbar spine, thoracic spine and cervical spine. Additional lesions in  the scapula. 3. Fracture of the medial RIGHT ischium adjacent to the pubic symphysis without metabolic activity. 4. Metastatic iliac adenopathy. No evidence of metastatic adenopathy outside the pelvis. 5. Normal volume spleen. Electronically  Signed   By: Suzy Bouchard M.D.   On: 02/14/2021 15:21   XR C-ARM NO REPORT  Result Date: 01/28/2021 Please see Notes tab for imaging impression.  CUP PACEART REMOTE DEVICE CHECK  Result Date: 02/11/2021 Scheduled remote reviewed. Normal device function.  Next remote 91 days. HB   ASSESSMENT & PLAN:   87 yo with   1) Newly diagnosed Metastatic prostate cancer with bone metastases and especially left hip pain. 2) Bone metastases 3) h/o follicular lymphoma in 5427 rx with R-CVP 4) Mild normocytic anemia - likely from metastatic prostate cancer. PLAN: Labs results discussed with patient and son in clinic -significant elevated PSA levels of 1200 strongly suggestive that bone lesions are from metastatic prostate cancer. -patient declined option for CT guided bone lesion biopsy r/o more definitively proved prostate cancer. -will start patient on monthly Lupron and then if tolerated go to q3 monthly lupron. -bone directed rx with Bisphosphonates -referral to radiation oncology for consideration of palliative RT for symptom control from left hip involvement.  FOLLOW UP: Chemo-counseling for Lupron and Xgeva Plz schedule Lupron and Xgeva to start ASAP Refer to radiation oncology for palliative RT for metastatic prostate cancer RTC with Dr Irene Limbo in 3 weeks  . No orders of the defined types were placed in this encounter.    The total time spent in the appointment was 40 minutes and more than 50% was on counseling and direct patient cares, ordering and mxof newly diagnosed metastatic prostate cancer.  All of the patient's questions were answered with apparent satisfaction. The patient knows to call the clinic with any problems, questions or  concerns.  Sullivan Lone MD [place] AAHIVMS [facility] Hematology/Oncology Physician [facility]  (Office):       [phone] (Work cell):  [phone] (Fax):           [phone]  02/17/2021 10:03 AM  I, Reinaldo Raddle, am acting as scribe for Dr. Sullivan Lone, MD.   .I have reviewed the above documentation for accuracy and completeness, and I agree with the above. Brunetta Genera MD

## 2021-02-26 NOTE — Telephone Encounter (Signed)
Scheduled per 02/22 los, called and spoke with patient's son about upcoming 03/09 and 03/16 appointment. Patient will be notified.

## 2021-02-26 NOTE — Progress Notes (Incomplete)
Jacob Warner         [phone] ________________________________  Initial outpatient Consultation  Name: Jacob Warner MRN: [id number]  Date: 02/26/2021  DOB: 08-24-1924  REFERRING PHYSICIAN: No ref. provider found  DIAGNOSIS: 87 yo man with painful left hip skeletal metastasis from newly diagnosed stage IV castration sensitive prostate cancer with PSA of 1203.0  No diagnosis found.  HISTORY OF PRESENT ILLNESS::Jacob Warner is a 87 y.o. male who was treated for follicular lymphoma in 2542 when he completed 5 months of Ritxuan, Cytoxan, and Prednisone. He has since been in remission.    Recently, he has been suffering with increasing left hip and back pain.  Lumbar CT on 01/16/21 showed a new sclerotic bone lesion of the left posterior ilium suggestive of bone metastasis.  In work-up, Dr. Irene Limbo ordered PSA on 02/04/21 which returned markedly elevated at 1203.0.  PET-CT on 02/14/21.  This showed extensive hypermetabolic involvement of the LEFT bony hemipelvis involving the acetabulum, ischium, and medial iliac bone. The intense metabolic activity is accompanied by extensive sclerosis and soft tissue extends to the musculature adjacent to the acetabulum.  Additional metastatic lesions in the inferior RIGHT ischium, lumbar spine, thoracic spine and cervical spine.  Additional lesions in the scapula. There was fracture of the medial RIGHT ischium adjacent to the pubic symphysis without metabolic activity. Also seen was metastatic iliac adenopathy.     The patient has been started on Lupron and referred for possible palliative radiotherapy.  PREVIOUS RADIATION THERAPY: No  Past Medical History:  Diagnosis Date  . A-fib ([place])   . Alopecia   . Anemia   . Bradycardia   . Cancer ([place])   . Cherry angioma   . Echocardiogram    Echo 11/19: Mild concentric LVH, EF 50-55, normal wall motion, grade 1 diastolic dysfunction, mild AI, MAC, mild MR, normal RVSF, moderate TR, PASP  36  . GERD (gastroesophageal reflux disease)   . Glaucoma   . Hypertension   . Nuclear stress test    Nuclear stress test 11/19: not gated, inf and apical defect c/w small scar and/or soft tissue attenuation  . Pelvic fracture ([place])   . Right BBB/left ant fasc block   . Sick sinus syndrome ([place])   . TIA (transient ischemic attack)   . Xeroderma   :  Past Surgical History:  Procedure Laterality Date  . APPENDECTOMY    . CERVICAL SPINE SURGERY    . EXPLORATORY LAPAROTOMY    . LITHOTRIPSY    . PACEMAKER PLACEMENT    . PPM GENERATOR CHANGEOUT N/A 05/10/2020   Procedure: PPM GENERATOR CHANGEOUT;  Surgeon: [name], MD;  Location: [facility];  Service: Cardiovascular;  Laterality: N/A;  . SPINAL CORD STIMULATOR IMPLANT Bilateral 2018   [name], MD PhD, [facility]  :   Current Outpatient Medications:  .  acetaminophen (TYLENOL) 500 MG tablet, Take 500-1,000 mg by mouth See admin instructions. 500 mg twice daily, 1000 mg at bedtime, Disp: , Rfl:  .  aspirin 81 MG tablet, Take 81 mg by mouth daily. (Patient not taking: Reported on 02/04/2021), Disp: , Rfl:  .  ASPIRIN 81 PO, 1 tablet, Disp: , Rfl:  .  azelastine (ASTELIN) 0.1 % nasal spray, Place 1 spray into both nostrils 2 (two) times daily as needed for rhinitis. , Disp: , Rfl:  .  calcium carbonate (TUMS) 500 MG chewable tablet, 1 tablet, Disp: , Rfl:  .  cephALEXin (KEFLEX) 500 MG capsule, 1 capsule, Disp: , Rfl:  .  denosumab (PROLIA) 60 MG/ML SOSY injection, Inject 60 mg into the skin every 6 (six) months., Disp: , Rfl:  .  dutasteride (AVODART) 0.5 MG capsule, Take 0.5 mg by mouth daily., Disp: , Rfl:  .  furosemide (LASIX) 40 MG tablet, Take 40 mg by mouth daily., Disp: , Rfl:  .  gabapentin (NEURONTIN) 300 MG capsule, Take 1 capsule (300 mg total) by mouth at bedtime., Disp: 90 capsule, Rfl: 1 .  HYDROcodone-acetaminophen (NORCO/VICODIN) 5-325 MG tablet, Take 1 tablet by mouth  every 6 (six) hours as needed for moderate pain or severe pain., Disp: 60 tablet, Rfl: 0 .  hydroxypropyl methylcellulose / hypromellose (ISOPTO TEARS / GONIOVISC) 2.5 % ophthalmic solution, Place 1 drop into both eyes as needed for dry eyes., Disp: , Rfl:  .  ibuprofen (ADVIL) 200 MG tablet, 1 tablet with food or milk as needed, Disp: , Rfl:  .  ketoconazole (NIZORAL) 2 % shampoo, Apply 1 application topically 2 (two) times a week. , Disp: , Rfl:  .  lactulose (CHRONULAC) 10 GM/15ML solution, Take 10 g by mouth daily., Disp: , Rfl: 0 .  latanoprost (XALATAN) 0.005 % ophthalmic solution, Place 1 drop into both eyes at bedtime. , Disp: , Rfl: 11 .  magnesium oxide (MAG-OX) 400 MG tablet, 1 tablet, Disp: , Rfl:  .  Magnesium Oxide 400 (240 Mg) MG TABS, Take 1 tablet by mouth 2 (two) times daily., Disp: , Rfl: 7 .  meloxicam (MOBIC) 15 MG tablet, Take 15 mg by mouth daily., Disp: , Rfl:  .  montelukast (SINGULAIR) 10 MG tablet, 1 tablet, Disp: , Rfl:  .  nitroGLYCERIN (NITROSTAT) 0.4 MG SL tablet, Place 1 tablet (0.4 mg total) under the tongue every 5 (five) minutes as needed for chest pain., Disp: 25 tablet, Rfl: 5 .  pantoprazole (PROTONIX) 40 MG tablet, Take 1 tablet by mouth daily., Disp: , Rfl:  .  RESTASIS 0.05 % ophthalmic emulsion, Place 1 drop into both eyes daily. , Disp: , Rfl: 11 .  ROCKLATAN 0.02-0.005 % SOLN, , Disp: , Rfl:  .  topiramate (TOPAMAX) 25 MG tablet, , Disp: , Rfl:  .  traZODone (DESYREL) 50 MG tablet, Take 50 mg by mouth at bedtime. , Disp: , Rfl: 11 .  vitamin B-12 (CYANOCOBALAMIN) 1000 MCG tablet, Take 1,000 mcg by mouth daily. , Disp: , Rfl:  .  White Petrolatum-Mineral Oil (EYE LUBRICANT) OINT, Apply 1 strip to eye at bedtime., Disp: , Rfl: :  Allergies  Allergen Reactions  . Sulfamethoxazole-Trimethoprim     Other reaction(s): rash  . Sulfa Antibiotics Hives and Rash    Bactrim Other reaction(s): rash  . Sulfacetamide Sodium Hives and Rash    Bactrim   :  Family History  Problem Relation Age of Onset  . Brain cancer Sister   :  Social History   Socioeconomic History  . Marital status: Married    Spouse name: Not on file  . Number of children: Not on file  . Years of education: Not on file  . Highest education level: Not on file  Occupational History  . Not on file  Tobacco Use  . Smoking status: Never Smoker  . Smokeless tobacco: Never Used  Vaping Use  . Vaping Use: Never used  Substance and Sexual Activity  . Alcohol use: No    Alcohol/week: 0.0 standard drinks  . Drug use: No  . Sexual  activity: Not on file  Other Topics Concern  . Not on file  Social History Narrative  . Not on file   Social Determinants of Health   Financial Resource Strain: Not on file  Food Insecurity: Not on file  Transportation Needs: Not on file  Physical Activity: Not on file  Stress: Not on file  Social Connections: Not on file  Intimate Partner Violence: Not on file  :  REVIEW OF SYSTEMS:  A 15 point review of systems is documented in the electronic medical record. This was obtained by the nursing staff. However, I reviewed this with the patient to discuss relevant findings and make appropriate changes.  Pertinent items are noted in HPI.   PHYSICAL EXAM:  There were no vitals taken for this visit. {physical exam:[id number]}  KPS = 70  100 - Normal; no complaints; no evidence of disease. 90   - Able to carry on normal activity; minor signs or symptoms of disease. 80   - Normal activity with effort; some signs or symptoms of disease. 44   - Cares for self; unable to carry on normal activity or to do active work. 60   - Requires occasional assistance, but is able to care for most of his personal needs. 50   - Requires considerable assistance and frequent medical care. 30   - Disabled; requires special care and assistance. 72   - Severely disabled; hospital admission is indicated although death not imminent. 87   - Very sick;  hospital admission necessary; active supportive treatment necessary. 10   - Moribund; fatal processes progressing rapidly. 0     - Dead  Karnofsky DA, Abelmann Metter, Craver LS and Burchenal [facility] [phone]) The use of the nitrogen mustards in the palliative treatment of carcinoma: with particular reference to bronchogenic carcinoma Cancer 1 634-56  LABORATORY DATA:  Lab Results  Component Value Date   WBC 7.3 02/04/2021   HGB 10.4 (L) 02/04/2021   HCT 33.0 (L) 02/04/2021   MCV 97.6 02/04/2021   PLT 263 02/04/2021   Lab Results  Component Value Date   NA 141 02/04/2021   K 4.3 02/04/2021   CL 102 02/04/2021   CO2 30 02/04/2021   Lab Results  Component Value Date   ALT 9 02/04/2021   AST 22 02/04/2021   ALKPHOS 291 (H) 02/04/2021   BILITOT 0.4 02/04/2021     RADIOGRAPHY: Epidural Steroid injection  Result Date: 01/28/2021 Magnus Sinning, MD     01/28/2021 11:57 AM Lumbosacral Transforaminal Epidural Steroid Injection - Sub-Pedicular Approach with Fluoroscopic Guidance Patient: LANDRY KAMATH     Date of Birth: 07-19-1924 MRN: [id number] PCP: [name], MD     Visit Date: 01/28/2021  Universal Protocol:   Date/Time: 01/28/2021 Consent Given By: the patient Position: PRONE Additional Comments: Vital signs were monitored before and after the procedure. Patient was prepped and draped in the usual sterile fashion. The correct patient, procedure, and site was verified. Injection Procedure Details: Procedure diagnoses: Lumbar radiculopathy [M54.16]  Meds Administered: Meds ordered this encounter Medications . HYDROcodone-acetaminophen (NORCO/VICODIN) 5-325 MG tablet   Sig: Take 1 tablet by mouth every 6 (six) hours as needed for moderate pain or severe pain.   Dispense:  60 tablet   Refill:  0 . methylPREDNISolone acetate (DEPO-MEDROL) injection 80 mg Laterality: Left Location/Site: L4-L5 Needle:5.0 in., 22 ga.  Short bevel or Quincke spinal needle Needle Placement: Transforaminal Findings:    -Comments: Excellent flow of contrast along the nerve, nerve root  and into the epidural space. Procedure Details: After squaring off the end-plates to get a true AP view, the C-arm was positioned so that an oblique view of the foramen as noted above was visualized. The target area is just inferior to the "nose of the scotty dog" or sub pedicular. The soft tissues overlying this structure were infiltrated with 2-3 ml. of 1% Lidocaine without Epinephrine. The spinal needle was inserted toward the target using a "trajectory" view along the fluoroscope beam.  Under AP and lateral visualization, the needle was advanced so it did not puncture dura and was located close the 6 O'Clock position of the pedical in AP tracterory. Biplanar projections were used to confirm position. Aspiration was confirmed to be negative for CSF and/or blood. A 1-2 ml. volume of Isovue-250 was injected and flow of contrast was noted at each level. Radiographs were obtained for documentation purposes. After attaining the desired flow of contrast documented above, a 0.5 to 1.0 ml test dose of 0.25% Marcaine was injected into each respective transforaminal space.  The patient was observed for 90 seconds post injection.  After no sensory deficits were reported, and normal lower extremity motor function was noted,   the above injectate was administered so that equal amounts of the injectate were placed at each foramen (level) into the transforaminal epidural space. Additional Comments: The patient tolerated the procedure well Dressing: 2 x 2 sterile gauze and Band-Aid  Post-procedure details: Patient was observed during the procedure. Post-procedure instructions were reviewed. Patient left the clinic in stable condition.   NM PET Image Initial (PI) Skull Base To Thigh  Result Date: 02/14/2021 CLINICAL DATA:  Initial treatment strategy for follicular lymphoma. Painful LEFT ilium. EXAM: NUCLEAR MEDICINE PET SKULL BASE TO THIGH TECHNIQUE: 13.0 mCi  F-18 FDG was injected intravenously. Full-ring PET imaging was performed from the skull base to thigh after the radiotracer. CT data was obtained and used for attenuation correction and anatomic localization. Fasting blood glucose: 92 mg/dl COMPARISON:  None. FINDINGS: Mediastinal blood pool activity: SUV max 3.8 Liver activity: SUV max 4.8 NECK: No hypermetabolic lymph nodes in the neck. Incidental CT findings: None CHEST: No hypermetabolic mediastinal or hilar nodes. 4 mm nodule in the RIGHT lower lobe (image 79/4) Incidental CT findings: Pacer in the RIGHT chest wall. ABDOMEN/PELVIS: No abnormal hypermetabolic activity within the liver, pancreas, adrenal glands, or spleen. Some thickening of tissue along the superior mesenteric artery. No significant hypermetabolic activity. Hypermetabolic LEFT external iliac lymph node measures 15 mm short axis with SUV max equal 6.1 (image 173). SKELETON: Extensive involvement of the LEFT acetabulum with intense metabolic activity (SUV max equal 6.0). There is extension into the adjacent musculature of the operator fossa with similar intense metabolic activity (image 169). There is associated sclerosis of the LEFT acetabulum which extends into the inferior pubic ramus and superior to the LEFT iliac bone. Posterior LEFT iliac bone with SUV max equal 7.7. Additional scattered metastatic lesions within LEFT and RIGHT scapula. Intense metabolic activity associated with the C7 vertebral body on the RIGHT with SUV max equal 9.3. Additional metastatic lesions in the thoracic spine at T4-T5. Incidental CT findings: Fracture of the RIGHT ischium at the pubic symphysis (image 24/4. IMPRESSION: 1. Dominant finding is extensive hypermetabolic lymphoma involvement of the LEFT bony hemipelvis involving the acetabulum, ischium, and medial iliac bone. The intense metabolic activity is accompanied by extensive sclerosis and soft tissue extends to the musculature adjacent to the acetabulum. 2.  Additional metastatic lesions in the  inferior RIGHT ischium, lumbar spine, thoracic spine and cervical spine. Additional lesions in the scapula. 3. Fracture of the medial RIGHT ischium adjacent to the pubic symphysis without metabolic activity. 4. Metastatic iliac adenopathy. No evidence of metastatic adenopathy outside the pelvis. 5. Normal volume spleen. Electronically Signed   By: Suzy Bouchard M.D.   On: 02/14/2021 15:21   XR C-ARM NO REPORT  Result Date: 01/28/2021 Please see Notes tab for imaging impression.  CUP PACEART REMOTE DEVICE CHECK  Result Date: 02/11/2021 Scheduled remote reviewed. Normal device function.  Next remote 91 days. HB     IMPRESSION: 87 yo man with painful left hip skeletal metastasis from newly diagnosed stage IV castration sensitive prostate cancer with PSA of 1203.0.  He may benefit from palliative radiotherapy to the left hip.  PLAN:Today, I talked to the patient and family about the findings and work-up thus far.  We discussed the natural history of painful skeletal metastases from prostate cancer and general treatment, highlighting the role of radiotherapy in the management.  We discussed the available radiation techniques, and focused on the details of logistics and delivery.  We reviewed the anticipated acute and late sequelae associated with radiation in this setting.  The patient was encouraged to ask questions that I answered to the best of my ability.   The patient would like to proceed with radiation and will be scheduled for CT simulation.  I spent *** minutes in this encounter.    -------------------------------------------  Nicholos Johns, PA-C    Tyler Pita, MD  [facility] Direct Dial: [phone]  Fax: [phone] [email or web address]  Skype  LinkedIn

## 2021-02-26 NOTE — Progress Notes (Signed)
[place]         [phone]) [id number] ________________________________  Initial Outpatient Consultation - Conducted via telephone due to current COVID-19 concerns for limiting patient exposure  Name: Jacob Warner MRN: [id number]  Date: 02/26/2021  DOB: Aug 29, 1924  REFERRING PHYSICIAN: Brunetta Genera, MD  DIAGNOSIS: 87 yo man with painful left hip skeletal metastasis from newly diagnosed stage IV castration sensitive prostate cancer with PSA of 1203.0    ICD-10-CM   1. Prostate cancer metastatic to bone [facility])  C61    C79.51     HISTORY OF PRESENT ILLNESS::Jacob Warner is a 87 y.o. male who was treated for follicular lymphoma in 6599 when he completed 5 months of Ritxuan, Cytoxan, and Prednisone. He has since been in remission.    Recently, he has been suffering with increasing left hip and low back pain.  A lumbar CT on 01/16/21 showed a new sclerotic bone lesion of the left posterior ilium suggestive of bone metastasis.  In work-up, Dr. Irene Limbo ordered a PSA on 02/04/21 which returned markedly elevated at 1203.0.  A PET-CT on 02/14/21 showed extensive hypermetabolic involvement of the LEFT bony hemipelvis involving the acetabulum, ischium, and medial iliac bone. The intense metabolic activity is accompanied by extensive sclerosis and soft tissue extends to the musculature adjacent to the acetabulum.  Additional metastatic lesions were noted in the inferior RIGHT ischium, lumbar spine, thoracic spine, cervical spine and the scapula. There was fracture of the medial RIGHT ischium adjacent to the pubic symphysis without metabolic activity. Also seen was metastatic iliac adenopathy.     The patient has been started on Lupron and referred for possible palliative radiotherapy.  PREVIOUS RADIATION THERAPY: No  Past Medical History:  Diagnosis Date  . A-fib ([place])   . Alopecia   . Anemia   . Bradycardia   . Cancer ([place])   . Cherry angioma   . Echocardiogram    Echo  11/19: Mild concentric LVH, EF 50-55, normal wall motion, grade 1 diastolic dysfunction, mild AI, MAC, mild MR, normal RVSF, moderate TR, PASP 36  . GERD (gastroesophageal reflux disease)   . Glaucoma   . Hypertension   . Nuclear stress test    Nuclear stress test 11/19: not gated, inf and apical defect c/w small scar and/or soft tissue attenuation  . Pelvic fracture ([place])   . Right BBB/left ant fasc block   . Sick sinus syndrome ([place])   . TIA (transient ischemic attack)   . Xeroderma   :   Past Surgical History:  Procedure Laterality Date  . APPENDECTOMY    . CERVICAL SPINE SURGERY    . EXPLORATORY LAPAROTOMY    . LITHOTRIPSY    . PACEMAKER PLACEMENT    . PPM GENERATOR CHANGEOUT N/A 05/10/2020   Procedure: PPM GENERATOR CHANGEOUT;  Surgeon: [name], MD;  Location: [place] [facility];  Service: Cardiovascular;  Laterality: N/A;  . SPINAL CORD STIMULATOR IMPLANT Bilateral 2018   [name], MD PhD, [facility]  :   Current Outpatient Medications:  .  acetaminophen (TYLENOL) 500 MG tablet, Take 500-1,000 mg by mouth See admin instructions. 500 mg twice daily, 1000 mg at bedtime, Disp: , Rfl:  .  aspirin 81 MG tablet, Take 81 mg by mouth daily. (Patient not taking: Reported on 02/04/2021), Disp: , Rfl:  .  ASPIRIN 81 PO, 1 tablet, Disp: , Rfl:  .  azelastine (ASTELIN) 0.1 % nasal spray, Place 1 spray  into both nostrils 2 (two) times daily as needed for rhinitis. , Disp: , Rfl:  .  calcium carbonate (TUMS) 500 MG chewable tablet, 1 tablet, Disp: , Rfl:  .  cephALEXin (KEFLEX) 500 MG capsule, 1 capsule, Disp: , Rfl:  .  denosumab (PROLIA) 60 MG/ML SOSY injection, Inject 60 mg into the skin every 6 (six) months., Disp: , Rfl:  .  dutasteride (AVODART) 0.5 MG capsule, Take 0.5 mg by mouth daily., Disp: , Rfl:  .  furosemide (LASIX) 40 MG tablet, Take 40 mg by mouth daily., Disp: , Rfl:  .  gabapentin (NEURONTIN) 300 MG capsule, Take 1 capsule (300  mg total) by mouth at bedtime., Disp: 90 capsule, Rfl: 1 .  HYDROcodone-acetaminophen (NORCO/VICODIN) 5-325 MG tablet, Take 1 tablet by mouth every 6 (six) hours as needed for moderate pain or severe pain., Disp: 60 tablet, Rfl: 0 .  hydroxypropyl methylcellulose / hypromellose (ISOPTO TEARS / GONIOVISC) 2.5 % ophthalmic solution, Place 1 drop into both eyes as needed for dry eyes., Disp: , Rfl:  .  ibuprofen (ADVIL) 200 MG tablet, 1 tablet with food or milk as needed, Disp: , Rfl:  .  ketoconazole (NIZORAL) 2 % shampoo, Apply 1 application topically 2 (two) times a week. , Disp: , Rfl:  .  lactulose (CHRONULAC) 10 GM/15ML solution, Take 10 g by mouth daily., Disp: , Rfl: 0 .  latanoprost (XALATAN) 0.005 % ophthalmic solution, Place 1 drop into both eyes at bedtime. , Disp: , Rfl: 11 .  magnesium oxide (MAG-OX) 400 MG tablet, 1 tablet, Disp: , Rfl:  .  Magnesium Oxide 400 (240 Mg) MG TABS, Take 1 tablet by mouth 2 (two) times daily., Disp: , Rfl: 7 .  meloxicam (MOBIC) 15 MG tablet, Take 15 mg by mouth daily., Disp: , Rfl:  .  montelukast (SINGULAIR) 10 MG tablet, 1 tablet, Disp: , Rfl:  .  nitroGLYCERIN (NITROSTAT) 0.4 MG SL tablet, Place 1 tablet (0.4 mg total) under the tongue every 5 (five) minutes as needed for chest pain., Disp: 25 tablet, Rfl: 5 .  pantoprazole (PROTONIX) 40 MG tablet, Take 1 tablet by mouth daily., Disp: , Rfl:  .  RESTASIS 0.05 % ophthalmic emulsion, Place 1 drop into both eyes daily. , Disp: , Rfl: 11 .  ROCKLATAN 0.02-0.005 % SOLN, , Disp: , Rfl:  .  topiramate (TOPAMAX) 25 MG tablet, , Disp: , Rfl:  .  traZODone (DESYREL) 50 MG tablet, Take 50 mg by mouth at bedtime. , Disp: , Rfl: 11 .  vitamin B-12 (CYANOCOBALAMIN) 1000 MCG tablet, Take 1,000 mcg by mouth daily. , Disp: , Rfl:  .  White Petrolatum-Mineral Oil (EYE LUBRICANT) OINT, Apply 1 strip to eye at bedtime., Disp: , Rfl: :   Allergies  Allergen Reactions  . Sulfamethoxazole-Trimethoprim     Other  reaction(s): rash  . Sulfa Antibiotics Hives and Rash    Bactrim Other reaction(s): rash  . Sulfacetamide Sodium Hives and Rash    Bactrim  :   Family History  Problem Relation Age of Onset  . Brain cancer Sister   :   Social History   Socioeconomic History  . Marital status: Married    Spouse name: Not on file  . Number of children: Not on file  . Years of education: Not on file  . Highest education level: Not on file  Occupational History  . Not on file  Tobacco Use  . Smoking status: Never Smoker  . Smokeless tobacco:  Never Used  Vaping Use  . Vaping Use: Never used  Substance and Sexual Activity  . Alcohol use: No    Alcohol/week: 0.0 standard drinks  . Drug use: No  . Sexual activity: Not on file  Other Topics Concern  . Not on file  Social History Narrative  . Not on file   Social Determinants of Health   Financial Resource Strain: Not on file  Food Insecurity: Not on file  Transportation Needs: Not on file  Physical Activity: Not on file  Stress: Not on file  Social Connections: Not on file  Intimate Partner Violence: Not on file  :  REVIEW OF SYSTEMS:  On review of systems, the patient reports that he is doing well overall. He denies any chest pain, shortness of breath, cough, fevers, chills, or night sweats.  He has lost approximately 40 pounds over a 4-monthperiod of time due to decreased appetite. He denies any bowel disturbances, and denies abdominal pain, nausea or vomiting. He reports progressive worsening of pain in his low back and particularly the left hip but denies any other musculoskeletal or joint aches or pains.  A complete review of systems is obtained and is otherwise negative.   PHYSICAL EXAM:  Unable to assess due to telephone consult visit format.  KPS = 70  100 - Normal; no complaints; no evidence of disease. 90   - Able to carry on normal activity; minor signs or symptoms of disease. 80   - Normal activity with effort; some  signs or symptoms of disease. 738  - Cares for self; unable to carry on normal activity or to do active work. 60   - Requires occasional assistance, but is able to care for most of his personal needs. 50   - Requires considerable assistance and frequent medical care. 459  - Disabled; requires special care and assistance. 357  - Severely disabled; hospital admission is indicated although death not imminent. 212  - Very sick; hospital admission necessary; active supportive treatment necessary. 10   - Moribund; fatal processes progressing rapidly. 0     - Dead  Karnofsky DA, Abelmann WWindmill Craver LS and [facility]([phone] The use of the nitrogen mustards in the palliative treatment of carcinoma: with particular reference to bronchogenic carcinoma Cancer 1 634-56  LABORATORY DATA:  Lab Results  Component Value Date   WBC 7.3 02/04/2021   HGB 10.4 (L) 02/04/2021   HCT 33.0 (L) 02/04/2021   MCV 97.6 02/04/2021   PLT 263 02/04/2021   Lab Results  Component Value Date   NA 141 02/04/2021   K 4.3 02/04/2021   CL 102 02/04/2021   CO2 30 02/04/2021   Lab Results  Component Value Date   ALT 9 02/04/2021   AST 22 02/04/2021   ALKPHOS 291 (H) 02/04/2021   BILITOT 0.4 02/04/2021     RADIOGRAPHY: Epidural Steroid injection  Result Date: 01/28/2021 NMagnus Sinning MD     01/28/2021 11:57 AM Lumbosacral Transforaminal Epidural Steroid Injection - Sub-Pedicular Approach with Fluoroscopic Guidance Patient: WNORMA IGNASIAK    Date of Birth: [birth year]MRN: [id number]: WMaurice Small MD     Visit Date: 01/28/2021  Universal Protocol:   Date/Time: 01/28/2021 Consent Given By: the patient Position: PRONE Additional Comments: Vital signs were monitored before and after the procedure. Patient was prepped and draped in the usual sterile fashion. The correct patient, procedure, and site was verified. Injection Procedure Details: Procedure diagnoses: Lumbar  radiculopathy [M54.16]  Meds Administered: Meds  ordered this encounter Medications . HYDROcodone-acetaminophen (NORCO/VICODIN) 5-325 MG tablet   Sig: Take 1 tablet by mouth every 6 (six) hours as needed for moderate pain or severe pain.   Dispense:  60 tablet   Refill:  0 . methylPREDNISolone acetate (DEPO-MEDROL) injection 80 mg Laterality: Left Location/Site: L4-L5 Needle:5.0 in., 22 ga.  Short bevel or Quincke spinal needle Needle Placement: Transforaminal Findings:   -Comments: Excellent flow of contrast along the nerve, nerve root and into the epidural space. Procedure Details: After squaring off the end-plates to get a true AP view, the C-arm was positioned so that an oblique view of the foramen as noted above was visualized. The target area is just inferior to the "nose of the scotty dog" or sub pedicular. The soft tissues overlying this structure were infiltrated with 2-3 ml. of 1% Lidocaine without Epinephrine. The spinal needle was inserted toward the target using a "trajectory" view along the fluoroscope beam.  Under AP and lateral visualization, the needle was advanced so it did not puncture dura and was located close the 6 O'Clock position of the pedical in AP tracterory. Biplanar projections were used to confirm position. Aspiration was confirmed to be negative for CSF and/or blood. A 1-2 ml. volume of Isovue-250 was injected and flow of contrast was noted at each level. Radiographs were obtained for documentation purposes. After attaining the desired flow of contrast documented above, a 0.5 to 1.0 ml test dose of 0.25% Marcaine was injected into each respective transforaminal space.  The patient was observed for 90 seconds post injection.  After no sensory deficits were reported, and normal lower extremity motor function was noted,   the above injectate was administered so that equal amounts of the injectate were placed at each foramen (level) into the transforaminal epidural space. Additional Comments: The patient tolerated the procedure well  Dressing: 2 x 2 sterile gauze and Band-Aid  Post-procedure details: Patient was observed during the procedure. Post-procedure instructions were reviewed. Patient left the clinic in stable condition.   NM PET Image Initial (PI) Skull Base To Thigh  Result Date: 02/14/2021 CLINICAL DATA:  Initial treatment strategy for follicular lymphoma. Painful LEFT ilium. EXAM: NUCLEAR MEDICINE PET SKULL BASE TO THIGH TECHNIQUE: 13.0 mCi F-18 FDG was injected intravenously. Full-ring PET imaging was performed from the skull base to thigh after the radiotracer. CT data was obtained and used for attenuation correction and anatomic localization. Fasting blood glucose: 92 mg/dl COMPARISON:  None. FINDINGS: Mediastinal blood pool activity: SUV max 3.8 Liver activity: SUV max 4.8 NECK: No hypermetabolic lymph nodes in the neck. Incidental CT findings: None CHEST: No hypermetabolic mediastinal or hilar nodes. 4 mm nodule in the RIGHT lower lobe (image 79/4) Incidental CT findings: Pacer in the RIGHT chest wall. ABDOMEN/PELVIS: No abnormal hypermetabolic activity within the liver, pancreas, adrenal glands, or spleen. Some thickening of tissue along the superior mesenteric artery. No significant hypermetabolic activity. Hypermetabolic LEFT external iliac lymph node measures 15 mm short axis with SUV max equal 6.1 (image 173). SKELETON: Extensive involvement of the LEFT acetabulum with intense metabolic activity (SUV max equal 6.0). There is extension into the adjacent musculature of the operator fossa with similar intense metabolic activity (image 696). There is associated sclerosis of the LEFT acetabulum which extends into the inferior pubic ramus and superior to the LEFT iliac bone. Posterior LEFT iliac bone with SUV max equal 7.7. Additional scattered metastatic lesions within LEFT and RIGHT scapula. Intense metabolic activity  associated with the C7 vertebral body on the RIGHT with SUV max equal 9.3. Additional metastatic lesions in  the thoracic spine at T4-T5. Incidental CT findings: Fracture of the RIGHT ischium at the pubic symphysis (image 24/4. IMPRESSION: 1. Dominant finding is extensive hypermetabolic lymphoma involvement of the LEFT bony hemipelvis involving the acetabulum, ischium, and medial iliac bone. The intense metabolic activity is accompanied by extensive sclerosis and soft tissue extends to the musculature adjacent to the acetabulum. 2. Additional metastatic lesions in the inferior RIGHT ischium, lumbar spine, thoracic spine and cervical spine. Additional lesions in the scapula. 3. Fracture of the medial RIGHT ischium adjacent to the pubic symphysis without metabolic activity. 4. Metastatic iliac adenopathy. No evidence of metastatic adenopathy outside the pelvis. 5. Normal volume spleen. Electronically Signed   By: Suzy Bouchard M.D.   On: 02/14/2021 15:21   XR C-ARM NO REPORT  Result Date: 01/28/2021 Please see Notes tab for imaging impression.  CUP PACEART REMOTE DEVICE CHECK  Result Date: 02/11/2021 Scheduled remote reviewed. Normal device function.  Next remote 91 days. HB   IMPRESSION/PLAN:  This visit was conducted via telephone to spare the patient unnecessary potential exposure in the healthcare setting during the current COVID-19 pandemic. 87 yo man with painful left hip skeletal metastasis from newly diagnosed stage IV castration sensitive prostate cancer with PSA of 1203.0.  Today, we talked to the patient and his son, Jacob Warner, about the findings and work-up thus far.  We discussed the natural history of painful skeletal metastases from prostate cancer and general treatment, highlighting the role of radiotherapy in the management.  We discussed the available radiation techniques, and focused on the details of logistics and delivery.  The recommendation is to proceed with a 2-week course of daily palliative radiotherapy to the painful left hip lesion.  We reviewed the anticipated acute and late sequelae  associated with radiation in this setting.  The patient was encouraged to ask questions that were answered to his stated satisfaction.   At the conclusion of our conversation, the patient would like to proceed with the daily palliative radiation and is tentatively scheduled for CT simulation at 1:30 PM on Thursday, 02/28/2021 in anticipation of beginning his daily treatments in the near future.  He will sign formal written consent to proceed at the time of CT simulation/treatment planning and a copy of this document will be placed in his medical record.  We enjoyed meeting him and his son today and look forward to continuing to participate in his care.  Given current concerns for patient exposure during the COVID-19 pandemic, this encounter was conducted via telephone. The patient and his son Jacob Warner were notified in advance and was offered a [facility] meeting to allow for face to face communication but unfortunately reported that he did not have the appropriate resources/technology to support such a visit and instead preferred to proceed with telephone consult. The patient has given verbal consent for this type of encounter. The time spent during this encounter was 60 minutes including telephone conversation with both the patient and his son as well as chart review in preparation for today's visit and documentation. The attendants for this meeting include Tyler Pita MD, Ashlyn Bruning PA-C, patient, Jacob Warner and his son, Jacob Warner. During the encounter, Tyler Pita MD and Freeman Caldron PA-C,  were located at [facility] Radiation Oncology Department.  Patient, Jacob Warner and his son, Jacob Warner. were located at home.    -------------------------------------------  [employer]  Bruning, PA-C    Tyler Pita, MD  [place] Direct Dial: [phone]  Fax: [phone] [email or web address]  Skype  LinkedIn

## 2021-02-27 ENCOUNTER — Encounter: Payer: Self-pay | Admitting: Hematology

## 2021-02-27 ENCOUNTER — Other Ambulatory Visit: Payer: Self-pay | Admitting: Hematology

## 2021-02-27 MED ORDER — HYDROCODONE-ACETAMINOPHEN 5-325 MG PO TABS: 1.0000 | ORAL_TABLET | Freq: Four times a day (QID) | ORAL | 0 refills | Status: DC | PRN

## 2021-02-28 ENCOUNTER — Ambulatory Visit
Admission: RE | Admit: 2021-02-28 | Discharge: 2021-02-28 | Disposition: A | Discharge status: Home / Self Care | Payer: Medicare PPO | Source: Ambulatory Visit | Attending: Radiation Oncology | Admitting: Radiation Oncology

## 2021-02-28 DIAGNOSIS — C61 Malignant neoplasm of prostate: Secondary | ICD-10-CM | POA: Insufficient documentation

## 2021-02-28 DIAGNOSIS — C7951 Secondary malignant neoplasm of bone: Secondary | ICD-10-CM | POA: Diagnosis not present

## 2021-02-28 DIAGNOSIS — Z510 Encounter for antineoplastic radiation therapy: Secondary | ICD-10-CM | POA: Diagnosis not present

## 2021-03-01 ENCOUNTER — Other Ambulatory Visit: Payer: Self-pay

## 2021-03-01 ENCOUNTER — Ambulatory Visit
Admission: RE | Admit: 2021-03-01 | Discharge: 2021-03-01 | Disposition: A | Discharge status: Home / Self Care | Payer: Medicare PPO | Source: Ambulatory Visit | Attending: Radiation Oncology | Admitting: Radiation Oncology

## 2021-03-01 DIAGNOSIS — Z510 Encounter for antineoplastic radiation therapy: Secondary | ICD-10-CM | POA: Diagnosis not present

## 2021-03-01 DIAGNOSIS — C61 Malignant neoplasm of prostate: Secondary | ICD-10-CM | POA: Diagnosis not present

## 2021-03-01 DIAGNOSIS — C7951 Secondary malignant neoplasm of bone: Secondary | ICD-10-CM | POA: Diagnosis not present

## 2021-03-03 NOTE — Progress Notes (Signed)
  Radiation Oncology         [phone] ________________________________  Name: Jacob Warner MRN: [id number]  Date: 02/28/2021  DOB: 1924/08/23  SIMULATION AND TREATMENT PLANNING NOTE    ICD-10-CM   1. Prostate cancer metastatic to bone [facility])  C61    C79.51     DIAGNOSIS:   87 yo man with painful left hip skeletal metastasis from newly diagnosed stage IV castration sensitive prostate cancer with PSA of 1203.0  NARRATIVE:  The patient was brought to the [facility].  Identity was confirmed.  All relevant records and images related to the planned course of therapy were reviewed.  The patient freely provided informed written consent to proceed with treatment after reviewing the details related to the planned course of therapy. The consent form was witnessed and verified by the simulation staff.  Then, the patient was set-up in a stable reproducible  supine position for radiation therapy.  CT images were obtained.  Surface markings were placed.  The CT images were loaded into the planning software.  Then the target and avoidance structures were contoured.  Treatment planning then occurred.  The radiation prescription was entered and confirmed.  Then, I designed and supervised the construction of a total of 3 medically necessary complex treatment devices consisting of leg positioner and MLC apertures to cover the treated hip area.  I have requested : 3D Simulation  I have requested a DVH of the following structures: Rectum, Bladder, femoral heads and target.  PLAN:  The patient will receive 30 Gy in 10 fractions.  ________________________________  Sheral Apley Tammi Klippel, M.D.

## 2021-03-04 ENCOUNTER — Ambulatory Visit
Admission: RE | Admit: 2021-03-04 | Discharge: 2021-03-04 | Disposition: A | Discharge status: Home / Self Care | Payer: Medicare PPO | Source: Ambulatory Visit | Attending: Radiation Oncology | Admitting: Radiation Oncology

## 2021-03-04 ENCOUNTER — Other Ambulatory Visit: Payer: Self-pay

## 2021-03-04 DIAGNOSIS — C61 Malignant neoplasm of prostate: Secondary | ICD-10-CM | POA: Diagnosis not present

## 2021-03-04 DIAGNOSIS — Z510 Encounter for antineoplastic radiation therapy: Secondary | ICD-10-CM | POA: Diagnosis not present

## 2021-03-04 DIAGNOSIS — C7951 Secondary malignant neoplasm of bone: Secondary | ICD-10-CM | POA: Diagnosis not present

## 2021-03-05 ENCOUNTER — Ambulatory Visit
Admission: RE | Admit: 2021-03-05 | Discharge: 2021-03-05 | Disposition: A | Discharge status: Home / Self Care | Payer: Medicare PPO | Source: Ambulatory Visit | Attending: Radiation Oncology | Admitting: Radiation Oncology

## 2021-03-05 ENCOUNTER — Other Ambulatory Visit: Payer: Self-pay

## 2021-03-05 ENCOUNTER — Encounter: Payer: Self-pay | Admitting: Hematology

## 2021-03-05 DIAGNOSIS — C7951 Secondary malignant neoplasm of bone: Secondary | ICD-10-CM | POA: Diagnosis not present

## 2021-03-05 DIAGNOSIS — Z510 Encounter for antineoplastic radiation therapy: Secondary | ICD-10-CM | POA: Diagnosis not present

## 2021-03-05 DIAGNOSIS — C61 Malignant neoplasm of prostate: Secondary | ICD-10-CM | POA: Diagnosis not present

## 2021-03-06 ENCOUNTER — Inpatient Hospital Stay: Payer: Medicare PPO

## 2021-03-06 ENCOUNTER — Ambulatory Visit
Admission: RE | Admit: 2021-03-06 | Discharge: 2021-03-06 | Disposition: A | Discharge status: Home / Self Care | Payer: Medicare PPO | Source: Ambulatory Visit | Attending: Radiation Oncology | Admitting: Radiation Oncology

## 2021-03-06 ENCOUNTER — Other Ambulatory Visit: Payer: Self-pay

## 2021-03-06 ENCOUNTER — Inpatient Hospital Stay: Payer: Medicare PPO | Attending: Hematology

## 2021-03-06 VITALS — BP 137/60 | HR 87 | Temp 98.0°F | Resp 18

## 2021-03-06 DIAGNOSIS — Z8572 Personal history of non-Hodgkin lymphomas: Secondary | ICD-10-CM | POA: Diagnosis not present

## 2021-03-06 DIAGNOSIS — C61 Malignant neoplasm of prostate: Secondary | ICD-10-CM | POA: Insufficient documentation

## 2021-03-06 DIAGNOSIS — C7951 Secondary malignant neoplasm of bone: Secondary | ICD-10-CM | POA: Insufficient documentation

## 2021-03-06 DIAGNOSIS — Z510 Encounter for antineoplastic radiation therapy: Secondary | ICD-10-CM | POA: Diagnosis not present

## 2021-03-06 DIAGNOSIS — D539 Nutritional anemia, unspecified: Secondary | ICD-10-CM | POA: Diagnosis not present

## 2021-03-06 LAB — CBC WITH DIFFERENTIAL (CANCER CENTER ONLY)
Abs Immature Granulocytes: 0.03 10*3/uL (ref 0.00–0.07)
Basophils Absolute: 0 10*3/uL (ref 0.0–0.1)
Basophils Relative: 1 %
Eosinophils Absolute: 0.2 10*3/uL (ref 0.0–0.5)
Eosinophils Relative: 4 %
HCT: 28.6 % — ABNORMAL LOW (ref 39.0–52.0)
Hemoglobin: 9.2 g/dL — ABNORMAL LOW (ref 13.0–17.0)
Immature Granulocytes: 1 %
Lymphocytes Relative: 20 %
Lymphs Abs: 1 10*3/uL (ref 0.7–4.0)
MCH: 31.3 pg (ref 26.0–34.0)
MCHC: 32.2 g/dL (ref 30.0–36.0)
MCV: 97.3 fL (ref 80.0–100.0)
Monocytes Absolute: 0.4 10*3/uL (ref 0.1–1.0)
Monocytes Relative: 8 %
Neutro Abs: 3.4 10*3/uL (ref 1.7–7.7)
Neutrophils Relative %: 66 %
Platelet Count: 269 10*3/uL (ref 150–400)
RBC: 2.94 MIL/uL — ABNORMAL LOW (ref 4.22–5.81)
RDW: 14.6 % (ref 11.5–15.5)
WBC Count: 5.1 10*3/uL (ref 4.0–10.5)
nRBC: 0 % (ref 0.0–0.2)

## 2021-03-06 LAB — CMP (CANCER CENTER ONLY)
ALT: 34 U/L (ref 0–44)
AST: 36 U/L (ref 15–41)
Albumin: 3.4 g/dL — ABNORMAL LOW (ref 3.5–5.0)
Alkaline Phosphatase: 195 U/L — ABNORMAL HIGH (ref 38–126)
Anion gap: 8 (ref 5–15)
BUN: 29 mg/dL — ABNORMAL HIGH (ref 8–23)
CO2: 30 mmol/L (ref 22–32)
Calcium: 9.1 mg/dL (ref 8.9–10.3)
Chloride: 102 mmol/L (ref 98–111)
Creatinine: 1.06 mg/dL (ref 0.61–1.24)
GFR, Estimated: >60 mL/min (ref >60)
Glucose, Bld: 93 mg/dL (ref 70–99)
Potassium: 4.3 mmol/L (ref 3.5–5.1)
Sodium: 140 mmol/L (ref 135–145)
Total Bilirubin: 0.4 mg/dL (ref 0.3–1.2)
Total Protein: 6.2 g/dL — ABNORMAL LOW (ref 6.5–8.1)

## 2021-03-06 MED ORDER — SODIUM CHLORIDE 0.9 % IV SOLN
60.0000 mg | Freq: Once | INTRAVENOUS | Status: AC
  Administered 2021-03-06: 60 mg via INTRAVENOUS
  Filled 2021-03-06: qty 10

## 2021-03-06 MED ORDER — SODIUM CHLORIDE 0.9 % IV SOLN
Freq: Once | INTRAVENOUS | Status: AC
  Filled 2021-03-06: qty 250

## 2021-03-06 NOTE — Patient Instructions (Signed)
Jacob Warner Discharge Instructions for Patients Receiving Chemotherapy  Today you received the following agents A[place]ia  To help prevent nausea and vomiting after your treatment, we encourage you to take your nausea medication as directed If you develop nausea and vomiting that is not controlled by your nausea medication, call the clinic.   BELOW ARE SYMPTOMS THAT SHOULD BE REPORTED IMMEDIATELY:  *FEVER GREATER THAN 100.5 F  *CHILLS WITH OR WITHOUT FEVER  NAUSEA AND VOMITING THAT IS NOT CONTROLLED WITH YOUR NAUSEA MEDICATION  *UNUSUAL SHORTNESS OF BREATH  *UNUSUAL BRUISING OR BLEEDING  TENDERNESS IN MOUTH AND THROAT WITH OR WITHOUT PRESENCE OF ULCERS  *URINARY PROBLEMS  *BOWEL PROBLEMS  UNUSUAL RASH Items with * indicate a potential emergency and should be followed up as soon as possible.  Feel free to call the clinic should you have any questions or concerns. The clinic phone number is [phone].  Please show the [place] at check-in to the Emergency Department and triage nurse.

## 2021-03-07 ENCOUNTER — Other Ambulatory Visit: Payer: Self-pay

## 2021-03-07 ENCOUNTER — Ambulatory Visit
Admission: RE | Admit: 2021-03-07 | Discharge: 2021-03-07 | Disposition: A | Discharge status: Home / Self Care | Payer: Medicare PPO | Source: Ambulatory Visit | Attending: Radiation Oncology | Admitting: Radiation Oncology

## 2021-03-07 DIAGNOSIS — C61 Malignant neoplasm of prostate: Secondary | ICD-10-CM | POA: Diagnosis not present

## 2021-03-07 DIAGNOSIS — Z510 Encounter for antineoplastic radiation therapy: Secondary | ICD-10-CM | POA: Diagnosis not present

## 2021-03-07 DIAGNOSIS — C7951 Secondary malignant neoplasm of bone: Secondary | ICD-10-CM | POA: Diagnosis not present

## 2021-03-08 ENCOUNTER — Ambulatory Visit
Admission: RE | Admit: 2021-03-08 | Discharge: 2021-03-08 | Disposition: A | Discharge status: Home / Self Care | Payer: Medicare PPO | Source: Ambulatory Visit | Attending: Radiation Oncology | Admitting: Radiation Oncology

## 2021-03-08 DIAGNOSIS — Z510 Encounter for antineoplastic radiation therapy: Secondary | ICD-10-CM | POA: Diagnosis not present

## 2021-03-08 DIAGNOSIS — C7951 Secondary malignant neoplasm of bone: Secondary | ICD-10-CM | POA: Diagnosis not present

## 2021-03-08 DIAGNOSIS — C61 Malignant neoplasm of prostate: Secondary | ICD-10-CM | POA: Diagnosis not present

## 2021-03-11 ENCOUNTER — Ambulatory Visit
Admission: RE | Admit: 2021-03-11 | Discharge: 2021-03-11 | Disposition: A | Discharge status: Home / Self Care | Payer: Medicare PPO | Source: Ambulatory Visit | Attending: Radiation Oncology | Admitting: Radiation Oncology

## 2021-03-11 DIAGNOSIS — C7951 Secondary malignant neoplasm of bone: Secondary | ICD-10-CM | POA: Diagnosis not present

## 2021-03-11 DIAGNOSIS — Z510 Encounter for antineoplastic radiation therapy: Secondary | ICD-10-CM | POA: Diagnosis not present

## 2021-03-11 DIAGNOSIS — C61 Malignant neoplasm of prostate: Secondary | ICD-10-CM | POA: Diagnosis not present

## 2021-03-12 ENCOUNTER — Ambulatory Visit
Admission: RE | Admit: 2021-03-12 | Discharge: 2021-03-12 | Disposition: A | Discharge status: Home / Self Care | Payer: Medicare PPO | Source: Ambulatory Visit | Attending: Radiation Oncology | Admitting: Radiation Oncology

## 2021-03-12 DIAGNOSIS — C7951 Secondary malignant neoplasm of bone: Secondary | ICD-10-CM | POA: Diagnosis not present

## 2021-03-12 DIAGNOSIS — C61 Malignant neoplasm of prostate: Secondary | ICD-10-CM | POA: Diagnosis not present

## 2021-03-12 DIAGNOSIS — Z510 Encounter for antineoplastic radiation therapy: Secondary | ICD-10-CM | POA: Diagnosis not present

## 2021-03-12 NOTE — Progress Notes (Incomplete)
HEMATOLOGY/ONCOLOGY CONSULTATION NOTE  Date of Service: 03/13/2021  Patient Care Team: [name], MD as PCP - General (Family Medicine) [name], MD as PCP - Cardiology (Cardiology) [name], MD as PCP - Electrophysiology (Cardiology)  CHIEF COMPLAINTS/PURPOSE OF CONSULTATION:  F/u for newly diagnosed prostate cancer with bone metastases Hx of Follicular Lymphoma  HISTORY OF PRESENTING ILLNESS:   Jacob Warner is a wonderful 87 y.o. male who has been referred to [place] by Dr. [name] for evaluation and management of abnormal CBC/history of follicular lymphoma. Pt is accompanied today by his son. The pt reports that he is doing well overall.   The pt reports that he was diagnosed with and treated for Follicular Lymphoma in 3419. He completed 5 months of Ritxuan, Cytoxan, and Prednisone. He has since been in remission.   A few months ago pt began having pain in his right-sided lower back. He found relief from this discomfort after he increased his water intake. The pain as since returned and remains despite the pt continuing to drink an adequate amount of water. Pt was referred to a Nephrologist after discussing his back pain with [name], his PCP. Pt has previously had kidney stones and has been told that he has BPH, but this has not disturbed his urinary habits. He denies seeing any blood in his urine, but was found to have hematuria on recent labs with [name]. Pt had an abdominal US in September, which showed a benign left kidney cyst. Pt has chronic back pain for which he receives steroid injections.    He has no history of thyroid disorders. Pt takes a baby Aspirin for his Afib and Pantoprazole daily. Pt has a history of dysphagia and has required esophageal dilation. He has lost nearly 40 lbs in the last six months. Pt has been eating less the last few months due to a decreased appetite and a change in routine. He also reports smaller stools in the last 6-8  months. His stools used to be round, but are now much thinner. This is completely new and persistent.   Of note since the patient's last visit, pt has had [place] Abd ([id number]) completed on 09/04/2020 with results revealing "Stable 2.8 x 3.5 x 2.3 cm left superior pole simple renal cyst. Otherwise unremarkable upper abdominal ultrasound with the pancreas not visualized."  Most recent lab results (08/21/2020) of CBC w/diff and CMP is as follows: all values are WNL except for RBC at 3.05, Hgb at 9.9, HCT at 29.3, MCV at 96.0, Neutro Rel at 73.8, Lymphs Rel at 10.3, Lymphs Abs at 0.90K, Glucose at 103, BUN at 32, Creatinine at 1.46, GFR Est Non Af Am at 45, ALP at 140.  On review of systems, pt reports dysphagia, weight loss, low appetite, back pain, change in appearance of stools, neuropathy and denies nausea, abdominal pain, hematuria, fevers, chills, night sweats, new lumps/bumps, bloody stools, abdominal pain, testicular pain/swelling and any other symptoms.   On PMHx the pt reports Follicular Lymphoma, Cervical Spine Surgery, Spinal Cord Stimulator Implant, Afib, Pelvic fracture, Reflux, BPH, Kidney Stones.   INTERVAL HISTORY Jacob Warner is a wonderful 87 y.o. male who is here today for evaluation and management of abnormal CBC/history of follicular lymphoma and new metastatic bone disease likely from metastatic prostate cancer. The patient's last visit with [place] was on 02/19/2021. We are joined today by his son.  The pt reports that he no longer has the pain in his  leg and is feeling more comfortable. He is using the Hydrocodone as needed, only at nighttime. The pt notes the pain is not getting in the way of his daily events or needs. He notes no issues tolerating the Lupron shot. The pt notes that some morning the pt feels fatigued. The pt notes that he was having difficulty passing urine due to a lack of water intake. The pt notes no obvious urine in the blood. The pt notes he has been eating  better.  Lab results today 03/13/2021 of CBC w/diff and CMP is as follows: all values are WNL except for RBC of 3.14, Hgb of 9.8, HCT of 31.0, Lymphs Abs of 0.6K. CMP Pending. 03/13/2021 Vitamin D 25 Hydroxy in progress. 03/13/2021 Prostate specific AG in progress.  On review of systems, pt reports fatigue and denies constipation, hot flashes, new bone pains, back pain, leg swelling, and any other symptoms.  MEDICAL HISTORY:  Past Medical History:  Diagnosis Date  . A-fib ([place])   . Alopecia   . Anemia   . Bradycardia   . Cancer ([place])   . Cherry angioma   . Echocardiogram    Echo 11/19: Mild concentric LVH, EF 50-55, normal wall motion, grade 1 diastolic dysfunction, mild AI, MAC, mild MR, normal RVSF, moderate TR, PASP 36  . GERD (gastroesophageal reflux disease)   . Glaucoma   . Hypertension   . Nuclear stress test    Nuclear stress test 11/19: not gated, inf and apical defect c/w small scar and/or soft tissue attenuation  . Pelvic fracture ([place])   . Right BBB/left ant fasc block   . Sick sinus syndrome ([place])   . TIA (transient ischemic attack)   . Xeroderma     SURGICAL HISTORY: Past Surgical History:  Procedure Laterality Date  . APPENDECTOMY    . CERVICAL SPINE SURGERY    . EXPLORATORY LAPAROTOMY    . LITHOTRIPSY    . PACEMAKER PLACEMENT    . PPM GENERATOR CHANGEOUT N/A 05/10/2020   Procedure: PPM GENERATOR CHANGEOUT;  Surgeon: [name], MD;  Location: [facility];  Service: Cardiovascular;  Laterality: N/A;  . SPINAL CORD STIMULATOR IMPLANT Bilateral 2018   [name], MD PhD, [facility]    SOCIAL HISTORY: Social History   Socioeconomic History  . Marital status: Married    Spouse name: Not on file  . Number of children: Not on file  . Years of education: Not on file  . Highest education level: Not on file  Occupational History  . Not on file  Tobacco Use  . Smoking status: Never Smoker  . Smokeless  tobacco: Never Used  Vaping Use  . Vaping Use: Never used  Substance and Sexual Activity  . Alcohol use: No    Alcohol/week: 0.0 standard drinks  . Drug use: No  . Sexual activity: Not on file  Other Topics Concern  . Not on file  Social History Narrative  . Not on file   Social Determinants of Health   Financial Resource Strain: Not on file  Food Insecurity: Not on file  Transportation Needs: Not on file  Physical Activity: Not on file  Stress: Not on file  Social Connections: Not on file  Intimate Partner Violence: Not on file    FAMILY HISTORY: Family History  Problem Relation Age of Onset  . Brain cancer Sister     ALLERGIES:  is allergic to sulfamethoxazole-trimethoprim, sulfa antibiotics, and sulfacetamide sodium.  MEDICATIONS:  Current Outpatient Medications  Medication Sig Dispense Refill  . acetaminophen (TYLENOL) 500 MG tablet Take 500-1,000 mg by mouth See admin instructions. 500 mg twice daily, 1000 mg at bedtime    . azelastine (ASTELIN) 0.1 % nasal spray Place 1 spray into both nostrils 2 (two) times daily as needed for rhinitis.     . calcium carbonate (TUMS) 500 MG chewable tablet 1 tablet    . cephALEXin (KEFLEX) 500 MG capsule 1 capsule    . denosumab (PROLIA) 60 MG/ML SOSY injection Inject 60 mg into the skin every 6 (six) months.    . dutasteride (AVODART) 0.5 MG capsule Take 0.5 mg by mouth daily.    . furosemide (LASIX) 40 MG tablet Take 40 mg by mouth daily.    Marland Kitchen gabapentin (NEURONTIN) 300 MG capsule Take 1 capsule (300 mg total) by mouth at bedtime. 90 capsule 1  . HYDROcodone-acetaminophen (NORCO/VICODIN) 5-325 MG tablet Take 1 tablet by mouth every 6 (six) hours as needed for moderate pain or severe pain. 60 tablet 0  . hydroxypropyl methylcellulose / hypromellose (ISOPTO TEARS / GONIOVISC) 2.5 % ophthalmic solution Place 1 drop into both eyes as needed for dry eyes.    Marland Kitchen ibuprofen (ADVIL) 200 MG tablet 1 tablet with food or milk as needed    .  ketoconazole (NIZORAL) 2 % shampoo Apply 1 application topically 2 (two) times a week.     . lactulose (CHRONULAC) 10 GM/15ML solution Take 10 g by mouth daily.  0  . latanoprost (XALATAN) 0.005 % ophthalmic solution Place 1 drop into both eyes at bedtime.   11  . magnesium oxide (MAG-OX) 400 MG tablet 1 tablet    . Magnesium Oxide 400 (240 Mg) MG TABS Take 1 tablet by mouth 2 (two) times daily.  7  . meloxicam (MOBIC) 15 MG tablet Take 15 mg by mouth daily.    . montelukast (SINGULAIR) 10 MG tablet 1 tablet    . nitroGLYCERIN (NITROSTAT) 0.4 MG SL tablet Place 1 tablet (0.4 mg total) under the tongue every 5 (five) minutes as needed for chest pain. 25 tablet 5  . pantoprazole (PROTONIX) 40 MG tablet Take 1 tablet by mouth daily.    . RESTASIS 0.05 % ophthalmic emulsion Place 1 drop into both eyes daily.   11  . ROCKLATAN 0.02-0.005 % SOLN     . topiramate (TOPAMAX) 25 MG tablet     . traZODone (DESYREL) 50 MG tablet Take 50 mg by mouth at bedtime.   11  . vitamin B-12 (CYANOCOBALAMIN) 1000 MCG tablet Take 1,000 mcg by mouth daily.     . White Petrolatum-Mineral Oil (EYE LUBRICANT) OINT Apply 1 strip to eye at bedtime.     No current facility-administered medications for this visit.    REVIEW OF SYSTEMS:   10 Point review of Systems was done is negative except as noted above.  PHYSICAL EXAMINATION: ECOG PERFORMANCE STATUS: 1 - Symptomatic but completely ambulatory   Exam was given in a chair.  GENERAL:alert, in no acute distress and comfortable SKIN: no acute rashes, no significant lesions EYES: conjunctiva are pink and non-injected, sclera anicteric OROPHARYNX: MMM, no exudates, no oropharyngeal erythema or ulceration NECK: supple, no JVD LYMPH:  no palpable lymphadenopathy in the cervical, axillary or inguinal regions LUNGS: clear to auscultation b/l with normal respiratory effort HEART: regular rate & rhythm ABDOMEN:  normoactive bowel sounds , non tender, not  distended. Extremity: no pedal edema PSYCH: alert & oriented x  3 with fluent speech NEURO: no focal motor/sensory deficits  LABORATORY DATA:  I have reviewed the data as listed  . CBC Latest Ref Rng & Units 03/13/2021 03/06/2021 02/04/2021  WBC 4.0 - 10.5 K/uL 6.8 5.1 7.3  Hemoglobin 13.0 - 17.0 g/dL 9.8(L) 9.2(L) 10.4(L)  Hematocrit 39.0 - 52.0 % 31.0(L) 28.6(L) 33.0(L)  Platelets 150 - 400 K/uL 268 269 263   . CBC    Component Value Date/Time   WBC 6.8 03/13/2021 1428   RBC 3.14 (L) 03/13/2021 1428   HGB 9.8 (L) 03/13/2021 1428   HGB 9.2 (L) 03/06/2021 0748   HGB 11.4 (L) 04/20/2020 1106   HGB 12.2 (L) 07/08/2017 1336   HCT 31.0 (L) 03/13/2021 1428   HCT 34.1 (L) 09/17/2020 1205   HCT 35.6 (L) 07/08/2017 1336   PLT 268 03/13/2021 1428   PLT 269 03/06/2021 0748   PLT 221 04/20/2020 1106   MCV 98.7 03/13/2021 1428   MCV 97 04/20/2020 1106   MCV 96.2 07/08/2017 1336   MCH 31.2 03/13/2021 1428   MCHC 31.6 03/13/2021 1428   RDW 15.5 03/13/2021 1428   RDW 13.2 04/20/2020 1106   RDW 13.9 07/08/2017 1336   LYMPHSABS 0.6 (L) 03/13/2021 1428   LYMPHSABS 1.3 04/20/2020 1106   LYMPHSABS 0.8 (L) 07/08/2017 1336   MONOABS 0.5 03/13/2021 1428   MONOABS 0.5 07/08/2017 1336   EOSABS 0.3 03/13/2021 1428   EOSABS 0.2 04/20/2020 1106   BASOSABS 0.0 03/13/2021 1428   BASOSABS 0.0 04/20/2020 1106   BASOSABS 0.0 07/08/2017 1336    . CMP Latest Ref Rng & Units 03/06/2021 02/04/2021 09/17/2020  Glucose 70 - 99 mg/dL 93 94 89  BUN 8 - 23 mg/dL 29(H) 33(H) 31(H)  Creatinine 0.61 - 1.24 mg/dL 1.06 1.27(H) 1.43(H)  Sodium 135 - 145 mmol/L 140 141 142  Potassium 3.5 - 5.1 mmol/L 4.3 4.3 4.2  Chloride 98 - 111 mmol/L 102 102 103  CO2 22 - 32 mmol/L _0 Calcium 8.9 - 10.3 mg/dL 9.1 9.4 9.4  Total Protein 6.5 - 8.1 g/dL 6.2(L) 6.9 6.8  Total Bilirubin 0.3 - 1.2 mg/dL 0.4 0.4 0.5  Alkaline Phos 38 - 126 U/L 195(H) 291(H) 96  AST 15 - 41 U/L 36 22 22  ALT 0 - 44 U/L 34 9 17    Component     Latest Ref Rng & Units 02/04/2021  IgG (Immunoglobin G), Serum     603 - 1,613 mg/dL 569 (L)  IgA     61 - 437 mg/dL 119  IgM (Immunoglobulin M), Srm     15 - 143 mg/dL 32  Total Protein ELP     6.0 - 8.5 g/dL 6.0  Albumin SerPl Elph-Mcnc     2.9 - 4.4 g/dL 3.4  Alpha 1     0.0 - 0.4 g/dL 0.3  Alpha2 Glob SerPl Elph-Mcnc     0.4 - 1.0 g/dL 0.9  B-Globulin SerPl Elph-Mcnc     0.7 - 1.3 g/dL 0.8  Gamma Glob SerPl Elph-Mcnc     0.4 - 1.8 g/dL 0.6  M Protein SerPl Elph-Mcnc     Not Observed g/dL Not Observed  Globulin, Total     2.2 - 3.9 g/dL 2.6  Albumin/Glob SerPl     0.7 - 1.7 1.4  IFE 1      Comment  Please Note (HCV):      Comment  Iron     42 - 163 ug/dL 88  TIBC     202 - 409 ug/dL 253  Saturation Ratios     20 - 55 % 35  UIBC     117 - 376 ug/dL 165  PSA, Free     N/A ng/mL >50.00  PSA, Free Pct     % >4.2  Prostate Specific Ag, Serum     0.0 - 4.0 ng/mL 1,203.0 (H)  Vitamin D, 25-Hydroxy     30 - 100 ng/mL 83.54  Ferritin     24 - 336 ng/mL 262  CEA ([facility])     0.00 - 5.00 ng/mL 1.38  LDH     98 - 192 U/L 446 (H)    RADIOGRAPHIC STUDIES: I have personally reviewed the radiological images as listed and agreed with the findings in the report. NM PET Image Initial (PI) Skull Base To Thigh  Result Date: 02/14/2021 CLINICAL DATA:  Initial treatment strategy for follicular lymphoma. Painful LEFT ilium. EXAM: NUCLEAR MEDICINE PET SKULL BASE TO THIGH TECHNIQUE: 13.0 mCi F-18 FDG was injected intravenously. Full-ring PET imaging was performed from the skull base to thigh after the radiotracer. CT data was obtained and used for attenuation correction and anatomic localization. Fasting blood glucose: 92 mg/dl COMPARISON:  None. FINDINGS: Mediastinal blood pool activity: SUV max 3.8 Liver activity: SUV max 4.8 NECK: No hypermetabolic lymph nodes in the neck. Incidental CT findings: None CHEST: No hypermetabolic mediastinal or hilar nodes. 4  mm nodule in the RIGHT lower lobe (image 79/4) Incidental CT findings: Pacer in the RIGHT chest wall. ABDOMEN/PELVIS: No abnormal hypermetabolic activity within the liver, pancreas, adrenal glands, or spleen. Some thickening of tissue along the superior mesenteric artery. No significant hypermetabolic activity. Hypermetabolic LEFT external iliac lymph node measures 15 mm short axis with SUV max equal 6.1 (image 173). SKELETON: Extensive involvement of the LEFT acetabulum with intense metabolic activity (SUV max equal 6.0). There is extension into the adjacent musculature of the operator fossa with similar intense metabolic activity (image 194). There is associated sclerosis of the LEFT acetabulum which extends into the inferior pubic ramus and superior to the LEFT iliac bone. Posterior LEFT iliac bone with SUV max equal 7.7. Additional scattered metastatic lesions within LEFT and RIGHT scapula. Intense metabolic activity associated with the C7 vertebral body on the RIGHT with SUV max equal 9.3. Additional metastatic lesions in the thoracic spine at T4-T5. Incidental CT findings: Fracture of the RIGHT ischium at the pubic symphysis (image 24/4. IMPRESSION: 1. Dominant finding is extensive hypermetabolic lymphoma involvement of the LEFT bony hemipelvis involving the acetabulum, ischium, and medial iliac bone. The intense metabolic activity is accompanied by extensive sclerosis and soft tissue extends to the musculature adjacent to the acetabulum. 2. Additional metastatic lesions in the inferior RIGHT ischium, lumbar spine, thoracic spine and cervical spine. Additional lesions in the scapula. 3. Fracture of the medial RIGHT ischium adjacent to the pubic symphysis without metabolic activity. 4. Metastatic iliac adenopathy. No evidence of metastatic adenopathy outside the pelvis. 5. Normal volume spleen. Electronically Signed   By: Suzy Bouchard M.D.   On: 02/14/2021 15:21    ASSESSMENT & PLAN:   87 yo with    1) Newly diagnosed Metastatic prostate cancer with bone metastases and especially left hip pain. 2) Bone metastases 3) h/o follicular lymphoma in 1740 rx with R-CVP 4) Mild normocytic anemia - likely from metastatic prostate cancer.   PLAN: -Discussed pt labwork today, 03/13/2021; blood counts holding steady. -Advised pt that tomorrow should be the  end of the radiation. The other treatments will be once monthly for the first few months. -Continue Lupron qmonthly at this time. Will switch to q44month in the future. -Continue Aredia qmonthly to reduce risks of fractures or bone hardening. -Recommended pt take 2000 IU Vitamin D daily. Advised pt he can take up to 5000 IU daily. -Recommended pt increase dairy intake in diet. Take 1000 mg Calcium supplement if not. -Will follow up on other labs pending. Continue monthly treatments for the next few months. -Advised pt we will consider getting repeat scans in 6 months. Will monitor PSA levels as marker of broad response in the meantime. -Will see back 1.5 months with labs.    FOLLOW UP: Plz schedule monthly Pamidronate and ELigard x 3 doses (Next dose 3/28) Labs with every other treatment May cancel labs and MD visit currently scheduled on 4/11 Plz schedule  labs and MD visit with the treatment after the next one (about end of April)     No orders of the defined types were placed in this encounter.   The total time spent in the appointment was 20 minutes and more than 50% was on counseling and direct patient cares.    All of the patient's questions were answered with apparent satisfaction. The patient knows to call the clinic with any problems, questions or concerns.  GSullivan LoneMD MPine GroveAAHIVMS SLowery A [facility] LLCCMethodist Healthcare - Fayette HospitalHematology/Oncology Physician CAlbany [facility] (Office):       [phone](Work cell):  [phone](Fax):           [phone] 03/13/2021 2:44 PM  I, RReinaldo Raddle am acting as scribe for Dr. GSullivan Lone MD.

## 2021-03-13 ENCOUNTER — Inpatient Hospital Stay: Payer: Medicare PPO

## 2021-03-13 ENCOUNTER — Other Ambulatory Visit: Payer: Self-pay | Admitting: Hematology

## 2021-03-13 ENCOUNTER — Other Ambulatory Visit: Payer: Self-pay

## 2021-03-13 ENCOUNTER — Inpatient Hospital Stay: Payer: Medicare PPO | Admitting: Hematology

## 2021-03-13 ENCOUNTER — Ambulatory Visit
Admission: RE | Admit: 2021-03-13 | Discharge: 2021-03-13 | Disposition: A | Discharge status: Home / Self Care | Payer: Medicare PPO | Source: Ambulatory Visit | Attending: Radiation Oncology | Admitting: Radiation Oncology

## 2021-03-13 VITALS — BP 142/47 | HR 56 | Temp 97.0°F | Resp 18 | Ht 70.0 in | Wt 164.1 lb

## 2021-03-13 DIAGNOSIS — C7951 Secondary malignant neoplasm of bone: Secondary | ICD-10-CM | POA: Diagnosis not present

## 2021-03-13 DIAGNOSIS — C61 Malignant neoplasm of prostate: Secondary | ICD-10-CM

## 2021-03-13 DIAGNOSIS — D539 Nutritional anemia, unspecified: Secondary | ICD-10-CM | POA: Diagnosis not present

## 2021-03-13 DIAGNOSIS — Z8572 Personal history of non-Hodgkin lymphomas: Secondary | ICD-10-CM | POA: Diagnosis not present

## 2021-03-13 DIAGNOSIS — Z510 Encounter for antineoplastic radiation therapy: Secondary | ICD-10-CM | POA: Diagnosis not present

## 2021-03-13 LAB — CMP (CANCER CENTER ONLY)
ALT: 19 U/L (ref 0–44)
AST: 20 U/L (ref 15–41)
Albumin: 3.6 g/dL (ref 3.5–5.0)
Alkaline Phosphatase: 183 U/L — ABNORMAL HIGH (ref 38–126)
Anion gap: 7 (ref 5–15)
BUN: 24 mg/dL — ABNORMAL HIGH (ref 8–23)
CO2: 27 mmol/L (ref 22–32)
Calcium: 8.5 mg/dL — ABNORMAL LOW (ref 8.9–10.3)
Chloride: 104 mmol/L (ref 98–111)
Creatinine: 1 mg/dL (ref 0.61–1.24)
GFR, Estimated: >60 mL/min (ref >60)
Glucose, Bld: 104 mg/dL — ABNORMAL HIGH (ref 70–99)
Potassium: 5 mmol/L (ref 3.5–5.1)
Sodium: 138 mmol/L (ref 135–145)
Total Bilirubin: 0.4 mg/dL (ref 0.3–1.2)
Total Protein: 6.4 g/dL — ABNORMAL LOW (ref 6.5–8.1)

## 2021-03-13 LAB — CBC WITH DIFFERENTIAL/PLATELET
Abs Immature Granulocytes: 0.03 10*3/uL (ref 0.00–0.07)
Basophils Absolute: 0 10*3/uL (ref 0.0–0.1)
Basophils Relative: 0 %
Eosinophils Absolute: 0.3 10*3/uL (ref 0.0–0.5)
Eosinophils Relative: 5 %
HCT: 31 % — ABNORMAL LOW (ref 39.0–52.0)
Hemoglobin: 9.8 g/dL — ABNORMAL LOW (ref 13.0–17.0)
Immature Granulocytes: 0 %
Lymphocytes Relative: 9 %
Lymphs Abs: 0.6 10*3/uL — ABNORMAL LOW (ref 0.7–4.0)
MCH: 31.2 pg (ref 26.0–34.0)
MCHC: 31.6 g/dL (ref 30.0–36.0)
MCV: 98.7 fL (ref 80.0–100.0)
Monocytes Absolute: 0.5 10*3/uL (ref 0.1–1.0)
Monocytes Relative: 7 %
Neutro Abs: 5.3 10*3/uL (ref 1.7–7.7)
Neutrophils Relative %: 79 %
Platelets: 268 10*3/uL (ref 150–400)
RBC: 3.14 MIL/uL — ABNORMAL LOW (ref 4.22–5.81)
RDW: 15.5 % (ref 11.5–15.5)
WBC: 6.8 10*3/uL (ref 4.0–10.5)
nRBC: 0 % (ref 0.0–0.2)

## 2021-03-13 LAB — VITAMIN D 25 HYDROXY (VIT D DEFICIENCY, FRACTURES): Vit D, 25-Hydroxy: 61.54 ng/mL (ref 30–100)

## 2021-03-14 ENCOUNTER — Ambulatory Visit
Admission: RE | Admit: 2021-03-14 | Discharge: 2021-03-14 | Disposition: A | Discharge status: Home / Self Care | Payer: Medicare PPO | Source: Ambulatory Visit | Attending: Radiation Oncology | Admitting: Radiation Oncology

## 2021-03-14 ENCOUNTER — Encounter: Payer: Self-pay | Admitting: Radiation Oncology

## 2021-03-14 DIAGNOSIS — C61 Malignant neoplasm of prostate: Secondary | ICD-10-CM | POA: Diagnosis not present

## 2021-03-14 DIAGNOSIS — C7951 Secondary malignant neoplasm of bone: Secondary | ICD-10-CM | POA: Diagnosis not present

## 2021-03-14 DIAGNOSIS — Z510 Encounter for antineoplastic radiation therapy: Secondary | ICD-10-CM | POA: Diagnosis not present

## 2021-03-14 LAB — PROSTATE-SPECIFIC AG, SERUM (LABCORP): Prostate Specific Ag, Serum: 1261 ng/mL — ABNORMAL HIGH (ref 0.0–4.0)

## 2021-03-19 ENCOUNTER — Telehealth: Payer: Self-pay | Admitting: Hematology

## 2021-03-19 NOTE — Progress Notes (Signed)
HEMATOLOGY/ONCOLOGY CONSULTATION NOTE  Date of Service: 03/19/2021  Patient Care Team: [name], MD as PCP - General (Family Medicine) [name], MD as PCP - Cardiology (Cardiology) [name], MD as PCP - Electrophysiology (Cardiology)  CHIEF COMPLAINTS/PURPOSE OF CONSULTATION:  F/u for newly diagnosed prostate cancer with bone metastases Hx of Follicular Lymphoma  HISTORY OF PRESENTING ILLNESS:   Jacob Warner is a wonderful 87 y.o. male who has been referred to [place] by Dr. [name] for evaluation and management of abnormal CBC/history of follicular lymphoma. Pt is accompanied today by his son. The pt reports that he is doing well overall.   The pt reports that he was diagnosed with and treated for Follicular Lymphoma in 5625. He completed 5 months of Ritxuan, Cytoxan, and Prednisone. He has since been in remission.   A few months ago pt began having pain in his right-sided lower back. He found relief from this discomfort after he increased his water intake. The pain as since returned and remains despite the pt continuing to drink an adequate amount of water. Pt was referred to a Nephrologist after discussing his back pain with [name], his PCP. Pt has previously had kidney stones and has been told that he has BPH, but this has not disturbed his urinary habits. He denies seeing any blood in his urine, but was found to have hematuria on recent labs with [name]. Pt had an abdominal US in September, which showed a benign left kidney cyst. Pt has chronic back pain for which he receives steroid injections.    He has no history of thyroid disorders. Pt takes a baby Aspirin for his Afib and Pantoprazole daily. Pt has a history of dysphagia and has required esophageal dilation. He has lost nearly 40 lbs in the last six months. Pt has been eating less the last few months due to a decreased appetite and a change in routine. He also reports smaller stools in the last 6-8  months. His stools used to be round, but are now much thinner. This is completely new and persistent.   Of note since the patient's last visit, pt has had [place] Abd ([id number]) completed on 09/04/2020 with results revealing "Stable 2.8 x 3.5 x 2.3 cm left superior pole simple renal cyst. Otherwise unremarkable upper abdominal ultrasound with the pancreas not visualized."  Most recent lab results (08/21/2020) of CBC w/diff and CMP is as follows: all values are WNL except for RBC at 3.05, Hgb at 9.9, HCT at 29.3, MCV at 96.0, Neutro Rel at 73.8, Lymphs Rel at 10.3, Lymphs Abs at 0.90K, Glucose at 103, BUN at 32, Creatinine at 1.46, GFR Est Non Af Am at 45, ALP at 140.  On review of systems, pt reports dysphagia, weight loss, low appetite, back pain, change in appearance of stools, neuropathy and denies nausea, abdominal pain, hematuria, fevers, chills, night sweats, new lumps/bumps, bloody stools, abdominal pain, testicular pain/swelling and any other symptoms.   On PMHx the pt reports Follicular Lymphoma, Cervical Spine Surgery, Spinal Cord Stimulator Implant, Afib, Pelvic fracture, Reflux, BPH, Kidney Stones.   INTERVAL HISTORY  Jacob Warner is a wonderful 87 y.o. male who is here today for evaluation and management of abnormal CBC/history of follicular lymphoma and new metastatic bone disease likely from metastatic prostate cancer. The patient's last visit with [place] was on 02/19/2021. We are joined today by his son.  The pt reports that he no longer has the pain in  his leg and is feeling more comfortable. He is using the Hydrocodone as needed, only at nighttime. The pt notes the pain is not getting in the way of his daily events or needs. He notes no issues tolerating the Lupron shot. The pt notes that some morning the pt feels fatigued. The pt notes that he was having difficulty passing urine due to a lack of water intake. The pt notes no obvious urine in the blood. The pt notes he has been eating  better.  Lab results today 03/13/2021 of CBC w/diff and CMP is as follows: all values are WNL except for RBC of 3.14, Hgb of 9.8, HCT of 31.0, Lymphs Abs of 0.6K. CMP Pending. 03/13/2021 Vitamin D 25 Hydroxy in 61.54 03/13/2021 Prostate specific AG 1261  On review of systems, pt reports fatigue and denies constipation, hot flashes, new bone pains, back pain, leg swelling, and any other symptoms.  MEDICAL HISTORY:  Past Medical History:  Diagnosis Date  . A-fib ([place])   . Alopecia   . Anemia   . Bradycardia   . Cancer ([place])   . Cherry angioma   . Echocardiogram    Echo 11/19: Mild concentric LVH, EF 50-55, normal wall motion, grade 1 diastolic dysfunction, mild AI, MAC, mild MR, normal RVSF, moderate TR, PASP 36  . GERD (gastroesophageal reflux disease)   . Glaucoma   . Hypertension   . Nuclear stress test    Nuclear stress test 11/19: not gated, inf and apical defect c/w small scar and/or soft tissue attenuation  . Pelvic fracture ([place])   . Right BBB/left ant fasc block   . Sick sinus syndrome ([place])   . TIA (transient ischemic attack)   . Xeroderma     SURGICAL HISTORY: Past Surgical History:  Procedure Laterality Date  . APPENDECTOMY    . CERVICAL SPINE SURGERY    . EXPLORATORY LAPAROTOMY    . LITHOTRIPSY    . PACEMAKER PLACEMENT    . PPM GENERATOR CHANGEOUT N/A 05/10/2020   Procedure: PPM GENERATOR CHANGEOUT;  Surgeon: [name], MD;  Location: [facility];  Service: Cardiovascular;  Laterality: N/A;  . SPINAL CORD STIMULATOR IMPLANT Bilateral 2018   [name], MD PhD, [facility]    SOCIAL HISTORY: Social History   Socioeconomic History  . Marital status: Married    Spouse name: Not on file  . Number of children: Not on file  . Years of education: Not on file  . Highest education level: Not on file  Occupational History  . Not on file  Tobacco Use  . Smoking status: Never Smoker  . Smokeless tobacco: Never Used   Vaping Use  . Vaping Use: Never used  Substance and Sexual Activity  . Alcohol use: No    Alcohol/week: 0.0 standard drinks  . Drug use: No  . Sexual activity: Not on file  Other Topics Concern  . Not on file  Social History Narrative  . Not on file   Social Determinants of Health   Financial Resource Strain: Not on file  Food Insecurity: Not on file  Transportation Needs: Not on file  Physical Activity: Not on file  Stress: Not on file  Social Connections: Not on file  Intimate Partner Violence: Not on file    FAMILY HISTORY: Family History  Problem Relation Age of Onset  . Brain cancer Sister     ALLERGIES:  is allergic to sulfamethoxazole-trimethoprim, sulfa antibiotics, and sulfacetamide sodium.  MEDICATIONS:  Current Outpatient Medications  Medication Sig Dispense Refill  . acetaminophen (TYLENOL) 500 MG tablet Take 500-1,000 mg by mouth See admin instructions. 500 mg twice daily, 1000 mg at bedtime    . azelastine (ASTELIN) 0.1 % nasal spray Place 1 spray into both nostrils 2 (two) times daily as needed for rhinitis.     . calcium carbonate (TUMS) 500 MG chewable tablet 1 tablet    . cephALEXin (KEFLEX) 500 MG capsule 1 capsule    . denosumab (PROLIA) 60 MG/ML SOSY injection Inject 60 mg into the skin every 6 (six) months.    . dutasteride (AVODART) 0.5 MG capsule Take 0.5 mg by mouth daily.    . furosemide (LASIX) 40 MG tablet Take 40 mg by mouth daily.    Marland Kitchen gabapentin (NEURONTIN) 300 MG capsule Take 1 capsule (300 mg total) by mouth at bedtime. 90 capsule 1  . HYDROcodone-acetaminophen (NORCO/VICODIN) 5-325 MG tablet Take 1 tablet by mouth every 6 (six) hours as needed for moderate pain or severe pain. 60 tablet 0  . hydroxypropyl methylcellulose / hypromellose (ISOPTO TEARS / GONIOVISC) 2.5 % ophthalmic solution Place 1 drop into both eyes as needed for dry eyes.    Marland Kitchen ibuprofen (ADVIL) 200 MG tablet 1 tablet with food or milk as needed    . ketoconazole  (NIZORAL) 2 % shampoo Apply 1 application topically 2 (two) times a week.     . lactulose (CHRONULAC) 10 GM/15ML solution Take 10 g by mouth daily.  0  . latanoprost (XALATAN) 0.005 % ophthalmic solution Place 1 drop into both eyes at bedtime.   11  . magnesium oxide (MAG-OX) 400 MG tablet 1 tablet    . Magnesium Oxide 400 (240 Mg) MG TABS Take 1 tablet by mouth 2 (two) times daily.  7  . meloxicam (MOBIC) 15 MG tablet Take 15 mg by mouth daily.    . montelukast (SINGULAIR) 10 MG tablet 1 tablet    . nitroGLYCERIN (NITROSTAT) 0.4 MG SL tablet Place 1 tablet (0.4 mg total) under the tongue every 5 (five) minutes as needed for chest pain. 25 tablet 5  . pantoprazole (PROTONIX) 40 MG tablet Take 1 tablet by mouth daily.    . RESTASIS 0.05 % ophthalmic emulsion Place 1 drop into both eyes daily.   11  . ROCKLATAN 0.02-0.005 % SOLN     . topiramate (TOPAMAX) 25 MG tablet     . traZODone (DESYREL) 50 MG tablet Take 50 mg by mouth at bedtime.   11  . vitamin B-12 (CYANOCOBALAMIN) 1000 MCG tablet Take 1,000 mcg by mouth daily.     . White Petrolatum-Mineral Oil (EYE LUBRICANT) OINT Apply 1 strip to eye at bedtime.     No current facility-administered medications for this visit.    REVIEW OF SYSTEMS:   10 Point review of Systems was done is negative except as noted above.  PHYSICAL EXAMINATION: ECOG PERFORMANCE STATUS: 1 - Symptomatic but completely ambulatory   Exam was given in a chair.  GENERAL:alert, in no acute distress and comfortable SKIN: no acute rashes, no significant lesions EYES: conjunctiva are pink and non-injected, sclera anicteric OROPHARYNX: MMM, no exudates, no oropharyngeal erythema or ulceration NECK: supple, no JVD LYMPH:  no palpable lymphadenopathy in the cervical, axillary or inguinal regions LUNGS: clear to auscultation b/l with normal respiratory effort HEART: regular rate & rhythm ABDOMEN:  normoactive bowel sounds , non tender, not distended. Extremity: no pedal  edema PSYCH: alert & oriented x  3 with fluent speech NEURO: no focal motor/sensory deficits  LABORATORY DATA:  I have reviewed the data as listed  . CBC Latest Ref Rng & Units 03/13/2021 03/06/2021 02/04/2021  WBC 4.0 - 10.5 K/uL 6.8 5.1 7.3  Hemoglobin 13.0 - 17.0 g/dL 9.8(L) 9.2(L) 10.4(L)  Hematocrit 39.0 - 52.0 % 31.0(L) 28.6(L) 33.0(L)  Platelets 150 - 400 K/uL 268 269 263   . CBC    Component Value Date/Time   WBC 6.8 03/13/2021 1428   RBC 3.14 (L) 03/13/2021 1428   HGB 9.8 (L) 03/13/2021 1428   HGB 9.2 (L) 03/06/2021 0748   HGB 11.4 (L) 04/20/2020 1106   HGB 12.2 (L) 07/08/2017 1336   HCT 31.0 (L) 03/13/2021 1428   HCT 34.1 (L) 09/17/2020 1205   HCT 35.6 (L) 07/08/2017 1336   PLT 268 03/13/2021 1428   PLT 269 03/06/2021 0748   PLT 221 04/20/2020 1106   MCV 98.7 03/13/2021 1428   MCV 97 04/20/2020 1106   MCV 96.2 07/08/2017 1336   MCH 31.2 03/13/2021 1428   MCHC 31.6 03/13/2021 1428   RDW 15.5 03/13/2021 1428   RDW 13.2 04/20/2020 1106   RDW 13.9 07/08/2017 1336   LYMPHSABS 0.6 (L) 03/13/2021 1428   LYMPHSABS 1.3 04/20/2020 1106   LYMPHSABS 0.8 (L) 07/08/2017 1336   MONOABS 0.5 03/13/2021 1428   MONOABS 0.5 07/08/2017 1336   EOSABS 0.3 03/13/2021 1428   EOSABS 0.2 04/20/2020 1106   BASOSABS 0.0 03/13/2021 1428   BASOSABS 0.0 04/20/2020 1106   BASOSABS 0.0 07/08/2017 1336    . CMP Latest Ref Rng & Units 03/13/2021 03/06/2021 02/04/2021  Glucose 70 - 99 mg/dL 104(H) 93 94  BUN 8 - 23 mg/dL 24(H) 29(H) 33(H)  Creatinine 0.61 - 1.24 mg/dL 1.00 1.06 1.27(H)  Sodium 135 - 145 mmol/L 138 140 141  Potassium 3.5 - 5.1 mmol/L 5.0 4.3 4.3  Chloride 98 - 111 mmol/L 104 102 102  CO2 22 - 32 mmol/L _0 Calcium 8.9 - 10.3 mg/dL 8.5(L) 9.1 9.4  Total Protein 6.5 - 8.1 g/dL 6.4(L) 6.2(L) 6.9  Total Bilirubin 0.3 - 1.2 mg/dL 0.4 0.4 0.4  Alkaline Phos 38 - 126 U/L 183(H) 195(H) 291(H)  AST 15 - 41 U/L 20 36 22  ALT 0 - 44 U/L 19 34 9   Component     Latest Ref Rng &  Units 02/04/2021  IgG (Immunoglobin G), Serum     603 - 1,613 mg/dL 569 (L)  IgA     61 - 437 mg/dL 119  IgM (Immunoglobulin M), Srm     15 - 143 mg/dL 32  Total Protein ELP     6.0 - 8.5 g/dL 6.0  Albumin SerPl Elph-Mcnc     2.9 - 4.4 g/dL 3.4  Alpha 1     0.0 - 0.4 g/dL 0.3  Alpha2 Glob SerPl Elph-Mcnc     0.4 - 1.0 g/dL 0.9  B-Globulin SerPl Elph-Mcnc     0.7 - 1.3 g/dL 0.8  Gamma Glob SerPl Elph-Mcnc     0.4 - 1.8 g/dL 0.6  M Protein SerPl Elph-Mcnc     Not Observed g/dL Not Observed  Globulin, Total     2.2 - 3.9 g/dL 2.6  Albumin/Glob SerPl     0.7 - 1.7 1.4  IFE 1      Comment  Please Note (HCV):      Comment  Iron     42 - 163 ug/dL 88  TIBC     202 - 409 ug/dL 253  Saturation Ratios     20 - 55 % 35  UIBC     117 - 376 ug/dL 165  PSA, Free     N/A ng/mL >50.00  PSA, Free Pct     % >4.2  Prostate Specific Ag, Serum     0.0 - 4.0 ng/mL 1,203.0 (H)  Vitamin D, 25-Hydroxy     30 - 100 ng/mL 83.54  Ferritin     24 - 336 ng/mL 262  CEA ([facility])     0.00 - 5.00 ng/mL 1.38  LDH     98 - 192 U/L 446 (H)    RADIOGRAPHIC STUDIES: I have personally reviewed the radiological images as listed and agreed with the findings in the report. No results found.  ASSESSMENT & PLAN:   87 yo with   1) Newly diagnosed Metastatic prostate cancer with bone metastases and especially left hip pain. 2) Bone metastases 3) h/o follicular lymphoma in 5374 rx with R-[facility] 4) Mild normocytic anemia - likely from metastatic prostate cancer.   PLAN: -Discussed pt labwork today, 03/13/2021; blood counts holding steady. -Advised pt that tomorrow should be the end of the radiation. The other treatments will be once monthly for the first few months. -Continue Lupron qmonthly at this time. Will switch to q11month in the future. -Continue Aredia qmonthly to reduce risks of fractures or bone hardening. -Recommended pt take 2000 IU Vitamin D daily. Advised pt he can take up to  5000 IU daily. -Recommended pt increase dairy intake in diet. Take 1000 mg Calcium supplement if not. -Will follow up on other labs pending. Continue monthly treatments for the next few months. -Advised pt we will consider getting repeat scans in 6 months. Will monitor PSA levels as marker of broad response in the meantime. -Will see back 1.5 months with labs.  FOLLOW UP: Plz schedule monthly Pamidronate and ELigard x 3 doses (Next dose 3/28) Labs with every other treatment May cancel labs and MD visit currently scheduled on 4/11 Plz schedule  labs and MD visit with the treatment after the next one (about end of April)  The total time spent in the appointment was 20 minutes and more than 50% was on counseling and direct patient cares.  All of the patient's questions were answered with apparent satisfaction. The patient knows to call the clinic with any problems, questions or concerns.  GSullivan LoneMD MMonmouthAAHIVMS SM[facility] LLCCGastroenterology Associates LLCHematology/Oncology Physician [facility] (Office):       [phone](Work cell):  [phone](Fax):           [phone] 03/19/2021 11:28 PM  I, RReinaldo Raddle am acting as scribe for Dr. GSullivan Lone MD.   .I have reviewed the above documentation for accuracy and completeness, and I agree with the above. .Brunetta GeneraMD

## 2021-03-19 NOTE — Telephone Encounter (Signed)
Scheduled follow-up appointments per 3/16 los. Patient's son is aware.

## 2021-03-20 ENCOUNTER — Encounter: Payer: Self-pay | Admitting: Hematology

## 2021-03-22 ENCOUNTER — Encounter: Payer: Self-pay | Admitting: Hematology

## 2021-03-25 ENCOUNTER — Other Ambulatory Visit: Payer: Self-pay

## 2021-03-25 ENCOUNTER — Inpatient Hospital Stay: Payer: Medicare PPO

## 2021-03-25 VITALS — BP 134/54 | HR 60 | Temp 97.2°F | Resp 16 | Wt 164.0 lb

## 2021-03-25 DIAGNOSIS — Z8572 Personal history of non-Hodgkin lymphomas: Secondary | ICD-10-CM | POA: Diagnosis not present

## 2021-03-25 DIAGNOSIS — C61 Malignant neoplasm of prostate: Secondary | ICD-10-CM

## 2021-03-25 DIAGNOSIS — C7951 Secondary malignant neoplasm of bone: Secondary | ICD-10-CM | POA: Diagnosis not present

## 2021-03-25 DIAGNOSIS — D539 Nutritional anemia, unspecified: Secondary | ICD-10-CM | POA: Diagnosis not present

## 2021-03-25 MED ORDER — SODIUM CHLORIDE 0.9 % IV SOLN: 60.0000 mg | Freq: Once | INTRAVENOUS | Status: DC

## 2021-03-25 MED ORDER — LEUPROLIDE ACETATE 7.5 MG ~~LOC~~ KIT
7.5000 mg | PACK | Freq: Once | SUBCUTANEOUS | Status: AC
  Administered 2021-03-25: 7.5 mg via SUBCUTANEOUS
  Filled 2021-03-25: qty 7.5

## 2021-03-25 MED ORDER — SODIUM CHLORIDE 0.9 % IV SOLN
Freq: Once | INTRAVENOUS | Status: DC
  Filled 2021-03-25: qty 250

## 2021-03-25 NOTE — Patient Instructions (Signed)

## 2021-04-03 ENCOUNTER — Other Ambulatory Visit: Payer: Self-pay

## 2021-04-03 ENCOUNTER — Inpatient Hospital Stay: Payer: Medicare PPO | Attending: Hematology

## 2021-04-03 VITALS — BP 127/61 | HR 89 | Temp 98.0°F | Resp 18

## 2021-04-03 DIAGNOSIS — C61 Malignant neoplasm of prostate: Secondary | ICD-10-CM | POA: Diagnosis not present

## 2021-04-03 DIAGNOSIS — D539 Nutritional anemia, unspecified: Secondary | ICD-10-CM | POA: Diagnosis not present

## 2021-04-03 DIAGNOSIS — C7951 Secondary malignant neoplasm of bone: Secondary | ICD-10-CM | POA: Insufficient documentation

## 2021-04-03 MED ORDER — SODIUM CHLORIDE 0.9 % IV SOLN
60.0000 mg | Freq: Once | INTRAVENOUS | Status: AC
  Administered 2021-04-03: 60 mg via INTRAVENOUS
  Filled 2021-04-03: qty 10

## 2021-04-03 MED ORDER — SODIUM CHLORIDE 0.9 % IV SOLN
Freq: Once | INTRAVENOUS | Status: AC
  Filled 2021-04-03: qty 250

## 2021-04-03 NOTE — Patient Instructions (Signed)
Pamidronate Injection What is this medicine? PAMIDRONATE (pa mi DROE nate) slows calcium loss from bones. It treats Paget's disease and high calcium levels in the blood from some kinds of cancer. It may be used in other people at risk for bone loss. This medicine may be used for other purposes; ask your health care provider or pharmacist if you have questions. COMMON BRAND NAME(S): Aredia What should I tell my health care provider before I take this medicine? They need to know if you have any of these conditions:  bleeding disorder  cancer  dental disease  kidney disease  low levels of calcium or other minerals in the blood  low red blood cell counts  receiving steroids like dexamethasone or prednisone  an unusual or allergic reaction to pamidronate, other drugs, foods, dyes or preservatives  pregnant or trying to get pregnant  breast-feeding How should I use this medicine? This drug is injected into a vein. It is given by a health care provider in a hospital or clinic setting. Talk to your health care provider about the use of this drug in children. Special care may be needed. Overdosage: If you think you have taken too much of this medicine contact a poison control center or emergency room at once. NOTE: This medicine is only for you. Do not share this medicine with others. What if I miss a dose? Keep appointments for follow-up doses. It is important not to miss your dose. Call your health care provider if you are unable to keep an appointment. What may interact with this medicine?  certain antibiotics given by injection  medicines for inflammation or pain like ibuprofen, naproxen  some diuretics like bumetanide, furosemide  cyclosporine  parathyroid hormone  tacrolimus  teriparatide  thalidomide This list may not describe all possible interactions. Give your health care provider a list of all the medicines, herbs, non-prescription drugs, or dietary supplements  you use. Also tell them if you smoke, drink alcohol, or use illegal drugs. Some items may interact with your medicine. What should I watch for while using this medicine? Visit your health care provider for regular checks on your progress. It may be some time before you see the benefit from this drug. Some people who take this drug have severe bone, joint, or muscle pain. This drug may also increase your risk for jaw problems or a broken thigh bone. Tell your health care provider right away if you have severe pain in your jaw, bones, joints, or muscles. Tell you health care provider if you have any pain that does not go away or that gets worse. Tell your dentist and dental surgeon that you are taking this drug. You should not have major dental surgery while on this drug. See your dentist to have a dental exam and fix any dental problems before starting this drug. Take good care of your teeth while on this drug. Make sure you see your dentist for regular follow-up appointments. You should make sure you get enough calcium and vitamin D while you are taking this drug. Discuss the foods you eat and the vitamins you take with your health care provider. You may need blood work done while you are taking this drug. Do not become pregnant while taking this drug. Women should inform their health care provider if they wish to become pregnant or think they might be pregnant. There is potential for serious harm to an unborn child. Talk to your health care provider for more information. What side effects   may I notice from receiving this medicine? Side effects that you should report to your doctor or health care provider as soon as possible:  allergic reactions (skin rash, itching or hives; swelling of the face, lips, or tongue)  bleeding (bloody or black, tarry stools; red or dark brown urine; spitting up blood or brown material that looks like coffee grounds; red spots on the skin; unusual bruising or bleeding from  the eyes, gums, or nose)  bone pain  increased thirst  infection (fever, chills, cough, sore throat, pain or trouble passing urine)  jaw pain, especially after dental work  joint pain  kidney injury (trouble passing urine or change in the amount of urine)  low calcium levels (fast heartbeat; muscle cramps or pain; pain, tingling, or numbness in the hands or feet; seizures)  low magnesium levels (fast, irregular heartbeat; muscle cramp or pain; muscle weakness; tremors; seizures)  low potassium levels (trouble breathing; chest pain; dizziness; fast, irregular heartbeat; feeling faint or lightheaded, falls; muscle cramps or pain)  muscle pain  pain, redness, or irritation at site where injected  redness, blistering, peeling, or loosening of the skin, including inside the mouth  severe diarrhea  unusual sweating Side effects that usually do not require medical attention (report to your doctor or health care provider if they continue or are bothersome):  constipation  eye irritation, itching, or pain  fever  headache  increase in blood pressure  loss of appetite  nausea  stomach pain  unusually weak or tired  vomiting This list may not describe all possible side effects. Call your doctor for medical advice about side effects. You may report side effects to FDA at 1-800-FDA-1088. Where should I keep my medicine? This drug is given in a hospital or clinic. It will not be stored at home. NOTE: This sheet is a summary. It may not cover all possible information. If you have questions about this medicine, talk to your doctor, pharmacist, or health care provider.  2021 Elsevier/Gold Standard (2019-11-07 12:19:52)  

## 2021-04-05 ENCOUNTER — Encounter: Payer: Self-pay | Admitting: Hematology

## 2021-04-08 ENCOUNTER — Other Ambulatory Visit: Payer: Self-pay | Admitting: Hematology

## 2021-04-08 ENCOUNTER — Ambulatory Visit: Payer: Medicare PPO | Admitting: Hematology

## 2021-04-08 ENCOUNTER — Other Ambulatory Visit: Payer: Medicare PPO

## 2021-04-08 MED ORDER — HYDROCODONE-ACETAMINOPHEN 5-325 MG PO TABS: 1.0000 | ORAL_TABLET | Freq: Four times a day (QID) | ORAL | 0 refills | Status: DC | PRN

## 2021-04-23 ENCOUNTER — Encounter: Payer: Self-pay | Admitting: Hematology

## 2021-04-24 NOTE — Progress Notes (Signed)
HEMATOLOGY/ONCOLOGY CONSULTATION NOTE  Date of Service: 04/25/2021  Patient Care Team: [name], MD as PCP - General (Family Medicine) [name], MD as PCP - Cardiology (Cardiology) [name], MD as PCP - Electrophysiology (Cardiology)  CHIEF COMPLAINTS/PURPOSE OF CONSULTATION:  F/u for newly diagnosed prostate cancer with bone metastases Hx of Follicular Lymphoma  HISTORY OF PRESENTING ILLNESS:   Jacob Warner is a wonderful 87 y.o. male who has been referred to [place] by Dr. [name] for evaluation and management of abnormal CBC/history of follicular lymphoma. Pt is accompanied today by his son. The pt reports that he is doing well overall.   The pt reports that he was diagnosed with and treated for Follicular Lymphoma in 9449. He completed 5 months of Ritxuan, Cytoxan, and Prednisone. He has since been in remission.   A few months ago pt began having pain in his right-sided lower back. He found relief from this discomfort after he increased his water intake. The pain as since returned and remains despite the pt continuing to drink an adequate amount of water. Pt was referred to a Nephrologist after discussing his back pain with [name], his PCP. Pt has previously had kidney stones and has been told that he has BPH, but this has not disturbed his urinary habits. He denies seeing any blood in his urine, but was found to have hematuria on recent labs with [name]. Pt had an abdominal US in September, which showed a benign left kidney cyst. Pt has chronic back pain for which he receives steroid injections.    He has no history of thyroid disorders. Pt takes a baby Aspirin for his Afib and Pantoprazole daily. Pt has a history of dysphagia and has required esophageal dilation. He has lost nearly 40 lbs in the last six months. Pt has been eating less the last few months due to a decreased appetite and a change in routine. He also reports smaller stools in the last 6-8  months. His stools used to be round, but are now much thinner. This is completely new and persistent.   Of note since the patient's last visit, pt has had [place] Abd ([id number]) completed on 09/04/2020 with results revealing "Stable 2.8 x 3.5 x 2.3 cm left superior pole simple renal cyst. Otherwise unremarkable upper abdominal ultrasound with the pancreas not visualized."  Most recent lab results (08/21/2020) of CBC w/diff and CMP is as follows: all values are WNL except for RBC at 3.05, Hgb at 9.9, HCT at 29.3, MCV at 96.0, Neutro Rel at 73.8, Lymphs Rel at 10.3, Lymphs Abs at 0.90K, Glucose at 103, BUN at 32, Creatinine at 1.46, GFR Est Non Af Am at 45, ALP at 140.  On review of systems, pt reports dysphagia, weight loss, low appetite, back pain, change in appearance of stools, neuropathy and denies nausea, abdominal pain, hematuria, fevers, chills, night sweats, new lumps/bumps, bloody stools, abdominal pain, testicular pain/swelling and any other symptoms.   On PMHx the pt reports Follicular Lymphoma, Cervical Spine Surgery, Spinal Cord Stimulator Implant, Afib, Pelvic fracture, Reflux, BPH, Kidney Stones.   INTERVAL HISTORY  Jacob Warner is a wonderful 87 y.o. male who is here today for evaluation and management of abnormal CBC/history of follicular lymphoma and new metastatic bone disease likely from metastatic prostate cancer. The patient's last visit with [place] was on 03/13/2021. We are joined today by his son.  The pt reports that he experienced a bad day this week  on Tuesday due to severe chest and back pain. The pt notes he has to take his pain medicine and sleep it off. The pt notes the pain medications helped with this. The majority of the pt's pain is in the lower left back. This is intermittent upon exertion and bending. On average, outside of this one bad day, the pt has had to take the pain medication rarely outside of at night when going to bed. The pt has been eating well and notes  weight gain. The pt notes it hurts when he takes a deep breath.  Lab results today 04/25/2021 of CBC w/diff and CMP is as follows: all values are WNL except for RBC of 3.10, Hgb of 10.0, HCT of 31.1, MCV of 100.3, BUN of 30, Total Protein of 6.3, AST of 13, Alkaline Phosphatase of 140, GFR est of 58. 04/25/2021 PSA level is down to 278 from 1261 04/25/2021 LDH of 228.  On review of systems, pt reports intermittent lower left back and chest pain, weight gain, frequent urination and denies decreased appetite, weight loss, changes in bowel habits, changes in urination, blood in stools, leg swelling, headaches, muscle cramps, and any other symptoms.  MEDICAL HISTORY:  Past Medical History:  Diagnosis Date  . A-fib ([place])   . Alopecia   . Anemia   . Bradycardia   . Cancer ([place])   . Cherry angioma   . Echocardiogram    Echo 11/19: Mild concentric LVH, EF 50-55, normal wall motion, grade 1 diastolic dysfunction, mild AI, MAC, mild MR, normal RVSF, moderate TR, PASP 36  . GERD (gastroesophageal reflux disease)   . Glaucoma   . Hypertension   . Nuclear stress test    Nuclear stress test 11/19: not gated, inf and apical defect c/w small scar and/or soft tissue attenuation  . Pelvic fracture ([place])   . Right BBB/left ant fasc block   . Sick sinus syndrome ([place])   . TIA (transient ischemic attack)   . Xeroderma     SURGICAL HISTORY: Past Surgical History:  Procedure Laterality Date  . APPENDECTOMY    . CERVICAL SPINE SURGERY    . EXPLORATORY LAPAROTOMY    . LITHOTRIPSY    . PACEMAKER PLACEMENT    . PPM GENERATOR CHANGEOUT N/A 05/10/2020   Procedure: PPM GENERATOR CHANGEOUT;  Surgeon: [name], MD;  Location: [facility];  Service: Cardiovascular;  Laterality: N/A;  . SPINAL CORD STIMULATOR IMPLANT Bilateral 2018   [name], MD PhD, [facility]    SOCIAL HISTORY: Social History   Socioeconomic History  . Marital status: Married     Spouse name: Not on file  . Number of children: Not on file  . Years of education: Not on file  . Highest education level: Not on file  Occupational History  . Not on file  Tobacco Use  . Smoking status: Never Smoker  . Smokeless tobacco: Never Used  Vaping Use  . Vaping Use: Never used  Substance and Sexual Activity  . Alcohol use: No    Alcohol/week: 0.0 standard drinks  . Drug use: No  . Sexual activity: Not on file  Other Topics Concern  . Not on file  Social History Narrative  . Not on file   Social Determinants of Health   Financial Resource Strain: Not on file  Food Insecurity: Not on file  Transportation Needs: Not on file  Physical Activity: Not on file  Stress: Not on file  Social Connections: Not on file  Intimate Partner Violence: Not on file    FAMILY HISTORY: Family History  Problem Relation Age of Onset  . Brain cancer Sister     ALLERGIES:  is allergic to sulfamethoxazole-trimethoprim, sulfa antibiotics, and sulfacetamide sodium.  MEDICATIONS:  Current Outpatient Medications  Medication Sig Dispense Refill  . acetaminophen (TYLENOL) 500 MG tablet Take 500-1,000 mg by mouth See admin instructions. 500 mg twice daily, 1000 mg at bedtime    . azelastine (ASTELIN) 0.1 % nasal spray Place 1 spray into both nostrils 2 (two) times daily as needed for rhinitis.     . calcium carbonate (TUMS) 500 MG chewable tablet 1 tablet    . cephALEXin (KEFLEX) 500 MG capsule 1 capsule    . denosumab (PROLIA) 60 MG/ML SOSY injection Inject 60 mg into the skin every 6 (six) months.    . dutasteride (AVODART) 0.5 MG capsule Take 0.5 mg by mouth daily.    . furosemide (LASIX) 40 MG tablet Take 40 mg by mouth daily.    Marland Kitchen gabapentin (NEURONTIN) 300 MG capsule Take 1 capsule (300 mg total) by mouth at bedtime. 90 capsule 1  . HYDROcodone-acetaminophen (NORCO/VICODIN) 5-325 MG tablet Take 1 tablet by mouth every 6 (six) hours as needed for moderate pain or severe pain. 60 tablet  0  . hydroxypropyl methylcellulose / hypromellose (ISOPTO TEARS / GONIOVISC) 2.5 % ophthalmic solution Place 1 drop into both eyes as needed for dry eyes.    Marland Kitchen ibuprofen (ADVIL) 200 MG tablet 1 tablet with food or milk as needed    . ketoconazole (NIZORAL) 2 % shampoo Apply 1 application topically 2 (two) times a week.     . lactulose (CHRONULAC) 10 GM/15ML solution Take 10 g by mouth daily.  0  . latanoprost (XALATAN) 0.005 % ophthalmic solution Place 1 drop into both eyes at bedtime.   11  . magnesium oxide (MAG-OX) 400 MG tablet 1 tablet    . Magnesium Oxide 400 (240 Mg) MG TABS Take 1 tablet by mouth 2 (two) times daily.  7  . meloxicam (MOBIC) 15 MG tablet Take 15 mg by mouth daily.    . montelukast (SINGULAIR) 10 MG tablet 1 tablet    . nitroGLYCERIN (NITROSTAT) 0.4 MG SL tablet Place 1 tablet (0.4 mg total) under the tongue every 5 (five) minutes as needed for chest pain. 25 tablet 5  . pantoprazole (PROTONIX) 40 MG tablet Take 1 tablet by mouth daily.    . RESTASIS 0.05 % ophthalmic emulsion Place 1 drop into both eyes daily.   11  . ROCKLATAN 0.02-0.005 % SOLN     . topiramate (TOPAMAX) 25 MG tablet     . traZODone (DESYREL) 50 MG tablet Take 50 mg by mouth at bedtime.   11  . vitamin B-12 (CYANOCOBALAMIN) 1000 MCG tablet Take 1,000 mcg by mouth daily.     . White Petrolatum-Mineral Oil (EYE LUBRICANT) OINT Apply 1 strip to eye at bedtime.     No current facility-administered medications for this visit.    REVIEW OF SYSTEMS:   10 Point review of Systems was done is negative except as noted above.  PHYSICAL EXAMINATION: ECOG PERFORMANCE STATUS: 1 - Symptomatic but completely ambulatory   Exam was given in a chair.   GENERAL:alert, in no acute distress and comfortable SKIN: no acute rashes, no significant lesions EYES: conjunctiva are pink and non-injected, sclera anicteric OROPHARYNX: MMM, no exudates, no oropharyngeal erythema or ulceration NECK: supple, no  JVD LYMPH:  no  palpable lymphadenopathy in the cervical, axillary or inguinal regions LUNGS: clear to auscultation b/l with normal respiratory effort HEART: regular rate & rhythm ABDOMEN:  normoactive bowel sounds , non tender, not distended. Extremity: no pedal edema PSYCH: alert & oriented x 3 with fluent speech NEURO: no focal motor/sensory deficits  LABORATORY DATA:  I have reviewed the data as listed  . CBC Latest Ref Rng & Units 04/25/2021 03/13/2021 03/06/2021  WBC 4.0 - 10.5 K/uL 5.3 6.8 5.1  Hemoglobin 13.0 - 17.0 g/dL 10.0(L) 9.8(L) 9.2(L)  Hematocrit 39.0 - 52.0 % 31.1(L) 31.0(L) 28.6(L)  Platelets 150 - 400 K/uL 200 268 269   . CBC    Component Value Date/Time   WBC 5.3 04/25/2021 1007   RBC 3.10 (L) 04/25/2021 1007   HGB 10.0 (L) 04/25/2021 1007   HGB 9.2 (L) 03/06/2021 0748   HGB 11.4 (L) 04/20/2020 1106   HGB 12.2 (L) 07/08/2017 1336   HCT 31.1 (L) 04/25/2021 1007   HCT 34.1 (L) 09/17/2020 1205   HCT 35.6 (L) 07/08/2017 1336   PLT 200 04/25/2021 1007   PLT 269 03/06/2021 0748   PLT 221 04/20/2020 1106   MCV 100.3 (H) 04/25/2021 1007   MCV 97 04/20/2020 1106   MCV 96.2 07/08/2017 1336   MCH 32.3 04/25/2021 1007   MCHC 32.2 04/25/2021 1007   RDW 14.6 04/25/2021 1007   RDW 13.2 04/20/2020 1106   RDW 13.9 07/08/2017 1336   LYMPHSABS 0.7 04/25/2021 1007   LYMPHSABS 1.3 04/20/2020 1106   LYMPHSABS 0.8 (L) 07/08/2017 1336   MONOABS 0.6 04/25/2021 1007   MONOABS 0.5 07/08/2017 1336   EOSABS 0.2 04/25/2021 1007   EOSABS 0.2 04/20/2020 1106   BASOSABS 0.0 04/25/2021 1007   BASOSABS 0.0 04/20/2020 1106   BASOSABS 0.0 07/08/2017 1336    . CMP Latest Ref Rng & Units 04/25/2021 03/13/2021 03/06/2021  Glucose 70 - 99 mg/dL 81 104(H) 93  BUN 8 - 23 mg/dL 30(H) 24(H) 29(H)  Creatinine 0.61 - 1.24 mg/dL 1.16 1.00 1.06  Sodium 135 - 145 mmol/L 140 138 140  Potassium 3.5 - 5.1 mmol/L 4.3 5.0 4.3  Chloride 98 - 111 mmol/L 100 104 102  CO2 22 - 32 mmol/L _0 Calcium 8.9 - 10.3  mg/dL 9.6 8.5(L) 9.1  Total Protein 6.5 - 8.1 g/dL 6.3(L) 6.4(L) 6.2(L)  Total Bilirubin 0.3 - 1.2 mg/dL 0.3 0.4 0.4  Alkaline Phos 38 - 126 U/L 140(H) 183(H) 195(H)  AST 15 - 41 U/L 13(L) 20 36  ALT 0 - 44 U/L 8 19 34   Component     Latest Ref Rng & Units 02/04/2021  IgG (Immunoglobin G), Serum     603 - 1,613 mg/dL 569 (L)  IgA     61 - 437 mg/dL 119  IgM (Immunoglobulin M), Srm     15 - 143 mg/dL 32  Total Protein ELP     6.0 - 8.5 g/dL 6.0  Albumin SerPl Elph-Mcnc     2.9 - 4.4 g/dL 3.4  Alpha 1     0.0 - 0.4 g/dL 0.3  Alpha2 Glob SerPl Elph-Mcnc     0.4 - 1.0 g/dL 0.9  B-Globulin SerPl Elph-Mcnc     0.7 - 1.3 g/dL 0.8  Gamma Glob SerPl Elph-Mcnc     0.4 - 1.8 g/dL 0.6  M Protein SerPl Elph-Mcnc     Not Observed g/dL Not Observed  Globulin, Total  2.2 - 3.9 g/dL 2.6  Albumin/Glob SerPl     0.7 - 1.7 1.4  IFE 1      Comment  Please Note (HCV):      Comment  Iron     42 - 163 ug/dL 88  TIBC     202 - 409 ug/dL 253  Saturation Ratios     20 - 55 % 35  UIBC     117 - 376 ug/dL 165  PSA, Free     N/A ng/mL >50.00  PSA, Free Pct     % >4.2  Prostate Specific Ag, Serum     0.0 - 4.0 ng/mL 1,203.0 (H)  Vitamin D, 25-Hydroxy     30 - 100 ng/mL 83.54  Ferritin     24 - 336 ng/mL 262  CEA ([facility])     0.00 - 5.00 ng/mL 1.38  LDH     98 - 192 U/L 446 (H)    RADIOGRAPHIC STUDIES: I have personally reviewed the radiological images as listed and agreed with the findings in the report. No results found.  ASSESSMENT & PLAN:   87 yo with   1) Newly diagnosed Metastatic prostate cancer with bone metastases and especially left hip pain. 2) Bone metastases 3) h/o follicular lymphoma in 5465 rx with R-CVP 4) Mild macrocytic anemia - likely from metastatic prostate cancer.   PLAN: -Discussed pt labwork today, 04/25/2021; counts and chemistries stable, LDH improved. PSA down to 278 from 1261 -Recommended pt continue to be cautious with bending and  twisting movements. -Advised pt we will continue with current plan as long as PSA levels keep decreasing. -Continue Lupron q39month at this time. -Continue Aredia qmonthly to reduce risks of fractures or bone hardening. -Recommended pt take 2000 IU Vitamin D daily. Advised pt he can take up to 5000 IU daily. -Advised pt he can use OTC Voltaren gel for application over painful area in back. Recommended OTC 4% Lidocaine patch for local pain control. -Will see back in 3 months with labs.   FOLLOW UP: Please schedule monthly labs and pamidronate infusions x 4 Lupron injections every 3 months please schedule x4  MD visit in 3 months with next Lupron shot     The total time spent in the appointment was 20 minutes and more than 50% was on counseling and direct patient cares.  All of the patient's questions were answered with apparent satisfaction. The patient knows to call the clinic with any problems, questions or concerns.  GSullivan LoneMD MJeffersonAAHIVMS SMed Atlantic IncCMethodist Southlake HospitalHematology/Oncology Physician [facility] (Office):       [phone](Work cell):  [phone](Fax):           [phone] 04/25/2021 12:37 PM  I, RReinaldo Raddle am acting as scribe for Dr. GSullivan Lone MD.   .I have reviewed the above documentation for accuracy and completeness, and I agree with the above. .Brunetta GeneraMD

## 2021-04-25 ENCOUNTER — Other Ambulatory Visit: Payer: Self-pay

## 2021-04-25 ENCOUNTER — Inpatient Hospital Stay: Payer: Medicare PPO

## 2021-04-25 ENCOUNTER — Inpatient Hospital Stay: Payer: Medicare PPO | Admitting: Hematology

## 2021-04-25 VITALS — BP 130/42 | HR 51 | Temp 97.0°F | Resp 20 | Ht 70.0 in | Wt 171.4 lb

## 2021-04-25 DIAGNOSIS — C7951 Secondary malignant neoplasm of bone: Secondary | ICD-10-CM

## 2021-04-25 DIAGNOSIS — C61 Malignant neoplasm of prostate: Secondary | ICD-10-CM

## 2021-04-25 DIAGNOSIS — D539 Nutritional anemia, unspecified: Secondary | ICD-10-CM | POA: Diagnosis not present

## 2021-04-25 DIAGNOSIS — D649 Anemia, unspecified: Secondary | ICD-10-CM

## 2021-04-25 LAB — CMP (CANCER CENTER ONLY)
ALT: 8 U/L (ref 0–44)
AST: 13 U/L — ABNORMAL LOW (ref 15–41)
Albumin: 3.6 g/dL (ref 3.5–5.0)
Alkaline Phosphatase: 140 U/L — ABNORMAL HIGH (ref 38–126)
Anion gap: 11 (ref 5–15)
BUN: 30 mg/dL — ABNORMAL HIGH (ref 8–23)
CO2: 29 mmol/L (ref 22–32)
Calcium: 9.6 mg/dL (ref 8.9–10.3)
Chloride: 100 mmol/L (ref 98–111)
Creatinine: 1.16 mg/dL (ref 0.61–1.24)
GFR, Estimated: 58 mL/min — ABNORMAL LOW (ref >60)
Glucose, Bld: 81 mg/dL (ref 70–99)
Potassium: 4.3 mmol/L (ref 3.5–5.1)
Sodium: 140 mmol/L (ref 135–145)
Total Bilirubin: 0.3 mg/dL (ref 0.3–1.2)
Total Protein: 6.3 g/dL — ABNORMAL LOW (ref 6.5–8.1)

## 2021-04-25 LAB — CBC WITH DIFFERENTIAL/PLATELET
Abs Immature Granulocytes: 0.03 10*3/uL (ref 0.00–0.07)
Basophils Absolute: 0 10*3/uL (ref 0.0–0.1)
Basophils Relative: 1 %
Eosinophils Absolute: 0.2 10*3/uL (ref 0.0–0.5)
Eosinophils Relative: 4 %
HCT: 31.1 % — ABNORMAL LOW (ref 39.0–52.0)
Hemoglobin: 10 g/dL — ABNORMAL LOW (ref 13.0–17.0)
Immature Granulocytes: 1 %
Lymphocytes Relative: 12 %
Lymphs Abs: 0.7 10*3/uL (ref 0.7–4.0)
MCH: 32.3 pg (ref 26.0–34.0)
MCHC: 32.2 g/dL (ref 30.0–36.0)
MCV: 100.3 fL — ABNORMAL HIGH (ref 80.0–100.0)
Monocytes Absolute: 0.6 10*3/uL (ref 0.1–1.0)
Monocytes Relative: 10 %
Neutro Abs: 3.9 10*3/uL (ref 1.7–7.7)
Neutrophils Relative %: 72 %
Platelets: 200 10*3/uL (ref 150–400)
RBC: 3.1 MIL/uL — ABNORMAL LOW (ref 4.22–5.81)
RDW: 14.6 % (ref 11.5–15.5)
WBC: 5.3 10*3/uL (ref 4.0–10.5)
nRBC: 0 % (ref 0.0–0.2)

## 2021-04-25 LAB — LACTATE DEHYDROGENASE: LDH: 228 U/L — ABNORMAL HIGH (ref 98–192)

## 2021-04-25 MED ORDER — LEUPROLIDE ACETATE (3 MONTH) 22.5 MG IM KIT
22.5000 mg | PACK | Freq: Once | INTRAMUSCULAR | Status: DC
  Filled 2021-04-25: qty 22.5

## 2021-04-25 MED ORDER — LEUPROLIDE ACETATE (3 MONTH) 22.5 MG ~~LOC~~ KIT
PACK | SUBCUTANEOUS | Status: AC
  Filled 2021-04-25: qty 22.5

## 2021-04-25 MED ORDER — SODIUM CHLORIDE 0.9 % IV SOLN
Freq: Once | INTRAVENOUS | Status: AC
  Filled 2021-04-25: qty 250

## 2021-04-25 MED ORDER — LEUPROLIDE ACETATE (3 MONTH) 22.5 MG ~~LOC~~ KIT
22.5000 mg | PACK | Freq: Once | SUBCUTANEOUS | Status: AC
Start: 2021-04-25 — End: 2021-04-25
  Administered 2021-04-25: 22.5 mg via SUBCUTANEOUS

## 2021-04-25 MED ORDER — SODIUM CHLORIDE 0.9 % IV SOLN
60.0000 mg | Freq: Once | INTRAVENOUS | Status: AC
  Administered 2021-04-25: 60 mg via INTRAVENOUS
  Filled 2021-04-25: qty 10

## 2021-04-25 NOTE — Patient Instructions (Signed)
[facility] [place] ONCOLOGY  Discharge Instructions: Thank you for choosing [facility] to provide your oncology and hematology care.   If you have a lab appointment with the [facility], please go directly to the [facility] and check in at the registration area.   Wear comfortable clothing and clothing appropriate for easy access to any Portacath or PICC line.   We strive to give you quality time with your provider. You may need to reschedule your appointment if you arrive late (15 or more minutes).  Arriving late affects you and other patients whose appointments are after yours.  Also, if you miss three or more appointments without notifying the office, you may be dismissed from the clinic at the provider's discretion.      For prescription refill requests, have your pharmacy contact our office and allow 72 hours for refills to be completed.    Today you received aredia and eliguard   To help prevent nausea and vomiting after your treatment, we encourage you to take your nausea medication as directed.  BELOW ARE SYMPTOMS THAT SHOULD BE REPORTED IMMEDIATELY: . *FEVER GREATER THAN 100.4 F (38 C) OR HIGHER . *CHILLS OR SWEATING . *NAUSEA AND VOMITING THAT IS NOT CONTROLLED WITH YOUR NAUSEA MEDICATION . *UNUSUAL SHORTNESS OF BREATH . *UNUSUAL BRUISING OR BLEEDING . *URINARY PROBLEMS (pain or burning when urinating, or frequent urination) . *BOWEL PROBLEMS (unusual diarrhea, constipation, pain near the anus) . TENDERNESS IN MOUTH AND THROAT WITH OR WITHOUT PRESENCE OF ULCERS (sore throat, sores in mouth, or a toothache) . UNUSUAL RASH, SWELLING OR PAIN  . UNUSUAL VAGINAL DISCHARGE OR ITCHING   Items with * indicate a potential emergency and should be followed up as soon as possible or go to the Emergency Department if any problems should occur.  Please show the CHEMOTHERAPY ALERT CARD or IMMUNOTHERAPY ALERT CARD at check-in to the Emergency Department and  triage nurse.  Should you have questions after your visit or need to cancel or reschedule your appointment, please contact [facility]  Dept: [phone]  and follow the prompts.  Office hours are 8:00 a.m. to 4:30 p.m. Monday - Friday. Please note that voicemails left after 4:00 p.m. may not be returned until the following business day.  We are closed weekends and major holidays. You have access to a nurse at all times for urgent questions. Please call the main number to the clinic Dept: [phone] and follow the prompts.   For any non-urgent questions, you may also contact your provider using MyChart. We now offer e-Visits for anyone 75 and older to request care online for non-urgent symptoms. For details visit [email or web address].   Also download the MyChart app! Go to the app store, search "MyChart", open the app, select [facility], and log in with your MyChart username and password.  Due to Covid, a mask is required upon entering the hospital/clinic. If you do not have a mask, one will be given to you upon arrival. For doctor visits, patients may have 1 support person aged 31 or older with them. For treatment visits, patients cannot have anyone with them due to current Covid guidelines and our immunocompromised population.

## 2021-04-26 LAB — PSA, TOTAL AND FREE
PSA, Free Pct: 14 %
PSA, Free: 38.8 ng/mL
Prostate Specific Ag, Serum: 278 ng/mL — ABNORMAL HIGH (ref 0.0–4.0)

## 2021-04-29 NOTE — Progress Notes (Signed)
  Radiation Oncology         [phone]) [phone] ________________________________  Name: Jacob Warner MRN: [id number]  Date: 03/14/2021  DOB: 1924-11-08  End of Treatment Note  Diagnosis:    87 yo man with painful left hip skeletal metastasis from newly diagnosed stage IV castration sensitive prostate cancer with PSA of 1203.0    Indication for treatment:  Palliation of Pain       Radiation treatment dates:   03/01/21-03/14/21  Site/dose:   The Left Hip received 30 Gy in 10 fractions of 3 Gy  Beams/energy:   3D conformal radiation was givne from Gan[place]ty 30 and 210 degrees with 10 MV X-rays    Narrative: The patient tolerated radiation treatment relatively well.   Pain improved.  Plan: The patient has completed radiation treatment. The patient will return to radiation oncology clinic for routine followup in one month. I advised him to call or return sooner if he has any questions or concerns related to his recovery or treatment. ________________________________  Sheral Apley. Tammi Klippel, M.D.

## 2021-05-01 ENCOUNTER — Telehealth: Payer: Self-pay | Admitting: Hematology

## 2021-05-01 ENCOUNTER — Other Ambulatory Visit: Payer: Self-pay | Admitting: Hematology

## 2021-05-01 NOTE — Telephone Encounter (Signed)
Scheduled follow-up appointments per 4/28 los. Patient's son is aware.

## 2021-05-13 ENCOUNTER — Ambulatory Visit (INDEPENDENT_AMBULATORY_CARE_PROVIDER_SITE_OTHER): Payer: Medicare PPO

## 2021-05-13 DIAGNOSIS — I495 Sick sinus syndrome: Secondary | ICD-10-CM | POA: Diagnosis not present

## 2021-05-14 LAB — CUP PACEART REMOTE DEVICE CHECK
Battery Remaining Longevity: 96 mo
Battery Remaining Percentage: 100 %
Brady Statistic RA Percent Paced: 46 %
Brady Statistic RV Percent Paced: 28 %
Date Time Interrogation Session: 20220516040100
Implantable Lead Implant Date: 20130926
Implantable Lead Implant Date: 20130926
Implantable Lead Location: 753859
Implantable Lead Location: 753860
Implantable Lead Model: 4456
Implantable Lead Model: 4476
Implantable Lead Serial Number: 473023
Implantable Lead Serial Number: 523746
Implantable Pulse Generator Implant Date: 20210513
Lead Channel Impedance Value: 332 Ohm
Lead Channel Impedance Value: 376 Ohm
Lead Channel Pacing Threshold Amplitude: 0.5 V
Lead Channel Pacing Threshold Pulse Width: 0.4 ms
Lead Channel Setting Pacing Amplitude: 2 V
Lead Channel Setting Pacing Amplitude: 2.5 V
Lead Channel Setting Pacing Pulse Width: 0.4 ms
Lead Channel Setting Sensing Sensitivity: 2.5 mV
Pulse Gen Serial Number: 528325

## 2021-05-23 ENCOUNTER — Other Ambulatory Visit: Payer: Self-pay

## 2021-05-23 ENCOUNTER — Encounter: Payer: Self-pay | Admitting: Hematology

## 2021-05-23 DIAGNOSIS — C61 Malignant neoplasm of prostate: Secondary | ICD-10-CM

## 2021-05-23 DIAGNOSIS — C7951 Secondary malignant neoplasm of bone: Secondary | ICD-10-CM

## 2021-05-24 ENCOUNTER — Other Ambulatory Visit: Payer: Self-pay

## 2021-05-24 ENCOUNTER — Inpatient Hospital Stay: Payer: Medicare PPO | Attending: Hematology

## 2021-05-24 ENCOUNTER — Other Ambulatory Visit: Payer: Self-pay | Admitting: Hematology and Oncology

## 2021-05-24 ENCOUNTER — Ambulatory Visit (HOSPITAL_COMMUNITY)
Admission: RE | Admit: 2021-05-24 | Discharge: 2021-05-24 | Disposition: A | Discharge status: Home / Self Care | Payer: Medicare PPO | Source: Ambulatory Visit | Attending: Hematology and Oncology | Admitting: Hematology and Oncology

## 2021-05-24 ENCOUNTER — Inpatient Hospital Stay: Payer: Medicare PPO

## 2021-05-24 VITALS — BP 129/71 | HR 50 | Temp 98.0°F | Resp 18

## 2021-05-24 DIAGNOSIS — C7951 Secondary malignant neoplasm of bone: Secondary | ICD-10-CM | POA: Insufficient documentation

## 2021-05-24 DIAGNOSIS — C61 Malignant neoplasm of prostate: Secondary | ICD-10-CM

## 2021-05-24 DIAGNOSIS — D539 Nutritional anemia, unspecified: Secondary | ICD-10-CM | POA: Diagnosis not present

## 2021-05-24 LAB — CMP (CANCER CENTER ONLY)
ALT: 7 U/L (ref 0–44)
AST: 14 U/L — ABNORMAL LOW (ref 15–41)
Albumin: 3.7 g/dL (ref 3.5–5.0)
Alkaline Phosphatase: 126 U/L (ref 38–126)
Anion gap: 11 (ref 5–15)
BUN: 33 mg/dL — ABNORMAL HIGH (ref 8–23)
CO2: 28 mmol/L (ref 22–32)
Calcium: 9.9 mg/dL (ref 8.9–10.3)
Chloride: 99 mmol/L (ref 98–111)
Creatinine: 1.15 mg/dL (ref 0.61–1.24)
GFR, Estimated: 59 mL/min — ABNORMAL LOW
Glucose, Bld: 88 mg/dL (ref 70–99)
Potassium: 4.4 mmol/L (ref 3.5–5.1)
Sodium: 138 mmol/L (ref 135–145)
Total Bilirubin: 0.3 mg/dL (ref 0.3–1.2)
Total Protein: 6.4 g/dL — ABNORMAL LOW (ref 6.5–8.1)

## 2021-05-24 LAB — CBC WITH DIFFERENTIAL/PLATELET
Abs Immature Granulocytes: 0.01 10*3/uL (ref 0.00–0.07)
Basophils Absolute: 0 10*3/uL (ref 0.0–0.1)
Basophils Relative: 0 %
Eosinophils Absolute: 0.2 10*3/uL (ref 0.0–0.5)
Eosinophils Relative: 3 %
HCT: 29.8 % — ABNORMAL LOW (ref 39.0–52.0)
Hemoglobin: 9.9 g/dL — ABNORMAL LOW (ref 13.0–17.0)
Immature Granulocytes: 0 %
Lymphocytes Relative: 15 %
Lymphs Abs: 0.7 10*3/uL (ref 0.7–4.0)
MCH: 32.2 pg (ref 26.0–34.0)
MCHC: 33.2 g/dL (ref 30.0–36.0)
MCV: 97.1 fL (ref 80.0–100.0)
Monocytes Absolute: 0.4 10*3/uL (ref 0.1–1.0)
Monocytes Relative: 9 %
Neutro Abs: 3.6 10*3/uL (ref 1.7–7.7)
Neutrophils Relative %: 73 %
Platelets: 199 10*3/uL (ref 150–400)
RBC: 3.07 MIL/uL — ABNORMAL LOW (ref 4.22–5.81)
RDW: 13.3 % (ref 11.5–15.5)
WBC: 4.9 10*3/uL (ref 4.0–10.5)
nRBC: 0 % (ref 0.0–0.2)

## 2021-05-24 MED ORDER — SODIUM CHLORIDE 0.9 % IV SOLN
60.0000 mg | Freq: Once | INTRAVENOUS | Status: AC
  Administered 2021-05-24: 60 mg via INTRAVENOUS
  Filled 2021-05-24: qty 10

## 2021-05-24 MED ORDER — SODIUM CHLORIDE 0.9 % IV SOLN
Freq: Once | INTRAVENOUS | Status: AC
  Filled 2021-05-24: qty 250

## 2021-05-24 NOTE — Progress Notes (Signed)
Patient c/o tenderness, redness, and warmth to LLE starting on Sunday 05/19/21.  RN notified MD to assess leg.  Doppler ordered and patient transported via wheelchair to [employer] with belongings and rolling walker.  Son notified and is aware to pick up patient from [facility] and not [facility].

## 2021-05-24 NOTE — Patient Instructions (Signed)
Pamidronate Injection What is this medicine? PAMIDRONATE (pa mi DROE nate) slows calcium loss from bones. It treats Paget's disease and high calcium levels in the blood from some kinds of cancer. It may be used in other people at risk for bone loss. This medicine may be used for other purposes; ask your health care provider or pharmacist if you have questions. COMMON BRAND NAME(S): Aredia What should I tell my health care provider before I take this medicine? They need to know if you have any of these conditions:  bleeding disorder  cancer  dental disease  kidney disease  low levels of calcium or other minerals in the blood  low red blood cell counts  receiving steroids like dexamethasone or prednisone  an unusual or allergic reaction to pamidronate, other drugs, foods, dyes or preservatives  pregnant or trying to get pregnant  breast-feeding How should I use this medicine? This drug is injected into a vein. It is given by a health care provider in a hospital or clinic setting. Talk to your health care provider about the use of this drug in children. Special care may be needed. Overdosage: If you think you have taken too much of this medicine contact a poison control center or emergency room at once. NOTE: This medicine is only for you. Do not share this medicine with others. What if I miss a dose? Keep appointments for follow-up doses. It is important not to miss your dose. Call your health care provider if you are unable to keep an appointment. What may interact with this medicine?  certain antibiotics given by injection  medicines for inflammation or pain like ibuprofen, naproxen  some diuretics like bumetanide, furosemide  cyclosporine  parathyroid hormone  tacrolimus  teriparatide  thalidomide This list may not describe all possible interactions. Give your health care provider a list of all the medicines, herbs, non-prescription drugs, or dietary supplements  you use. Also tell them if you smoke, drink alcohol, or use illegal drugs. Some items may interact with your medicine. What should I watch for while using this medicine? Visit your health care provider for regular checks on your progress. It may be some time before you see the benefit from this drug. Some people who take this drug have severe bone, joint, or muscle pain. This drug may also increase your risk for jaw problems or a broken thigh bone. Tell your health care provider right away if you have severe pain in your jaw, bones, joints, or muscles. Tell you health care provider if you have any pain that does not go away or that gets worse. Tell your dentist and dental surgeon that you are taking this drug. You should not have major dental surgery while on this drug. See your dentist to have a dental exam and fix any dental problems before starting this drug. Take good care of your teeth while on this drug. Make sure you see your dentist for regular follow-up appointments. You should make sure you get enough calcium and vitamin D while you are taking this drug. Discuss the foods you eat and the vitamins you take with your health care provider. You may need blood work done while you are taking this drug. Do not become pregnant while taking this drug. Women should inform their health care provider if they wish to become pregnant or think they might be pregnant. There is potential for serious harm to an unborn child. Talk to your health care provider for more information. What side effects   may I notice from receiving this medicine? Side effects that you should report to your doctor or health care provider as soon as possible:  allergic reactions (skin rash, itching or hives; swelling of the face, lips, or tongue)  bleeding (bloody or black, tarry stools; red or dark brown urine; spitting up blood or brown material that looks like coffee grounds; red spots on the skin; unusual bruising or bleeding from  the eyes, gums, or nose)  bone pain  increased thirst  infection (fever, chills, cough, sore throat, pain or trouble passing urine)  jaw pain, especially after dental work  joint pain  kidney injury (trouble passing urine or change in the amount of urine)  low calcium levels (fast heartbeat; muscle cramps or pain; pain, tingling, or numbness in the hands or feet; seizures)  low magnesium levels (fast, irregular heartbeat; muscle cramp or pain; muscle weakness; tremors; seizures)  low potassium levels (trouble breathing; chest pain; dizziness; fast, irregular heartbeat; feeling faint or lightheaded, falls; muscle cramps or pain)  muscle pain  pain, redness, or irritation at site where injected  redness, blistering, peeling, or loosening of the skin, including inside the mouth  severe diarrhea  unusual sweating Side effects that usually do not require medical attention (report to your doctor or health care provider if they continue or are bothersome):  constipation  eye irritation, itching, or pain  fever  headache  increase in blood pressure  loss of appetite  nausea  stomach pain  unusually weak or tired  vomiting This list may not describe all possible side effects. Call your doctor for medical advice about side effects. You may report side effects to FDA at 1-800-FDA-1088. Where should I keep my medicine? This drug is given in a hospital or clinic. It will not be stored at home. NOTE: This sheet is a summary. It may not cover all possible information. If you have questions about this medicine, talk to your doctor, pharmacist, or health care provider.  2021 Elsevier/Gold Standard (2019-11-07 12:19:52)  

## 2021-05-24 NOTE — Progress Notes (Signed)
Left lower extremity venous duplex has been completed. Preliminary results can be found in CV Proc through chart review.  Results were given to Dr. Lorenso Courier.  05/24/21 5:01 PM Jacob Warner RVT

## 2021-05-28 ENCOUNTER — Encounter: Payer: Self-pay | Admitting: Hematology

## 2021-05-28 ENCOUNTER — Other Ambulatory Visit: Payer: Self-pay

## 2021-05-28 MED ORDER — HYDROCODONE-ACETAMINOPHEN 5-325 MG PO TABS: 1.0000 | ORAL_TABLET | Freq: Four times a day (QID) | ORAL | 0 refills | Status: DC | PRN

## 2021-06-05 NOTE — Progress Notes (Signed)
Remote pacemaker transmission.   

## 2021-06-08 ENCOUNTER — Encounter: Payer: Self-pay | Admitting: Hematology

## 2021-06-19 ENCOUNTER — Encounter: Payer: Self-pay | Admitting: Hematology

## 2021-06-20 ENCOUNTER — Other Ambulatory Visit: Payer: Self-pay

## 2021-06-20 ENCOUNTER — Other Ambulatory Visit: Payer: Self-pay | Admitting: *Deleted

## 2021-06-20 ENCOUNTER — Encounter: Payer: Self-pay | Admitting: Hematology

## 2021-06-20 ENCOUNTER — Inpatient Hospital Stay: Payer: Medicare PPO | Attending: Hematology

## 2021-06-20 ENCOUNTER — Inpatient Hospital Stay: Payer: Medicare PPO

## 2021-06-20 VITALS — BP 130/64 | HR 82 | Temp 98.3°F | Resp 18

## 2021-06-20 DIAGNOSIS — C7951 Secondary malignant neoplasm of bone: Secondary | ICD-10-CM

## 2021-06-20 DIAGNOSIS — C61 Malignant neoplasm of prostate: Secondary | ICD-10-CM

## 2021-06-20 DIAGNOSIS — D539 Nutritional anemia, unspecified: Secondary | ICD-10-CM | POA: Insufficient documentation

## 2021-06-20 LAB — CBC WITH DIFFERENTIAL (CANCER CENTER ONLY)
Abs Immature Granulocytes: 0.02 10*3/uL (ref 0.00–0.07)
Basophils Absolute: 0 10*3/uL (ref 0.0–0.1)
Basophils Relative: 1 %
Eosinophils Absolute: 0.4 10*3/uL (ref 0.0–0.5)
Eosinophils Relative: 8 %
HCT: 28.4 % — ABNORMAL LOW (ref 39.0–52.0)
Hemoglobin: 9.4 g/dL — ABNORMAL LOW (ref 13.0–17.0)
Immature Granulocytes: 0 %
Lymphocytes Relative: 10 %
Lymphs Abs: 0.6 10*3/uL — ABNORMAL LOW (ref 0.7–4.0)
MCH: 32.4 pg (ref 26.0–34.0)
MCHC: 33.1 g/dL (ref 30.0–36.0)
MCV: 97.9 fL (ref 80.0–100.0)
Monocytes Absolute: 0.6 10*3/uL (ref 0.1–1.0)
Monocytes Relative: 10 %
Neutro Abs: 3.9 10*3/uL (ref 1.7–7.7)
Neutrophils Relative %: 71 %
Platelet Count: 191 10*3/uL (ref 150–400)
RBC: 2.9 MIL/uL — ABNORMAL LOW (ref 4.22–5.81)
RDW: 13.2 % (ref 11.5–15.5)
WBC Count: 5.5 10*3/uL (ref 4.0–10.5)
nRBC: 0 % (ref 0.0–0.2)

## 2021-06-20 LAB — CMP (CANCER CENTER ONLY)
ALT: 8 U/L (ref 0–44)
AST: 13 U/L — ABNORMAL LOW (ref 15–41)
Albumin: 3.2 g/dL — ABNORMAL LOW (ref 3.5–5.0)
Alkaline Phosphatase: 119 U/L (ref 38–126)
Anion gap: 11 (ref 5–15)
BUN: 37 mg/dL — ABNORMAL HIGH (ref 8–23)
CO2: 26 mmol/L (ref 22–32)
Calcium: 9.3 mg/dL (ref 8.9–10.3)
Chloride: 100 mmol/L (ref 98–111)
Creatinine: 1.32 mg/dL — ABNORMAL HIGH (ref 0.61–1.24)
GFR, Estimated: 50 mL/min — ABNORMAL LOW (ref >60)
Glucose, Bld: 101 mg/dL — ABNORMAL HIGH (ref 70–99)
Potassium: 4.6 mmol/L (ref 3.5–5.1)
Sodium: 137 mmol/L (ref 135–145)
Total Bilirubin: 0.4 mg/dL (ref 0.3–1.2)
Total Protein: 6.2 g/dL — ABNORMAL LOW (ref 6.5–8.1)

## 2021-06-20 MED ORDER — SODIUM CHLORIDE 0.9 % IV SOLN
60.0000 mg | Freq: Once | INTRAVENOUS | Status: AC
  Administered 2021-06-20: 60 mg via INTRAVENOUS
  Filled 2021-06-20: qty 10

## 2021-06-20 MED ORDER — SODIUM CHLORIDE 0.9 % IV SOLN
Freq: Once | INTRAVENOUS | Status: AC
  Filled 2021-06-20: qty 250

## 2021-06-20 NOTE — Patient Instructions (Signed)
[facility] ONCOLOGY  Discharge Instructions: Thank you for choosing [facility] to provide your oncology and hematology care.   If you have a lab appointment with the [facility], please go directly to the [facility] and check in at the registration area.   Wear comfortable clothing and clothing appropriate for easy access to any Portacath or PICC line.   We strive to give you quality time with your provider. You may need to reschedule your appointment if you arrive late (15 or more minutes).  Arriving late affects you and other patients whose appointments are after yours.  Also, if you miss three or more appointments without notifying the office, you may be dismissed from the clinic at the provider's discretion.      For prescription refill requests, have your pharmacy contact our office and allow 72 hours for refills to be completed.    Today you received the following- aredia   To help prevent nausea and vomiting after your treatment, we encourage you to take your nausea medication as directed.  BELOW ARE SYMPTOMS THAT SHOULD BE REPORTED IMMEDIATELY: *FEVER GREATER THAN 100.4 F (38 C) OR HIGHER *CHILLS OR SWEATING *NAUSEA AND VOMITING THAT IS NOT CONTROLLED WITH YOUR NAUSEA MEDICATION *UNUSUAL SHORTNESS OF BREATH *UNUSUAL BRUISING OR BLEEDING *URINARY PROBLEMS (pain or burning when urinating, or frequent urination) *BOWEL PROBLEMS (unusual diarrhea, constipation, pain near the anus) TENDERNESS IN MOUTH AND THROAT WITH OR WITHOUT PRESENCE OF ULCERS (sore throat, sores in mouth, or a toothache) UNUSUAL RASH, SWELLING OR PAIN  UNUSUAL VAGINAL DISCHARGE OR ITCHING   Items with * indicate a potential emergency and should be followed up as soon as possible or go to the Emergency Department if any problems should occur.  Please show the CHEMOTHERAPY ALERT CARD or IMMUNOTHERAPY ALERT CARD at check-in to the Emergency Department and triage  nurse.  Should you have questions after your visit or need to cancel or reschedule your appointment, please contact [facility]  Dept: [phone]  and follow the prompts.  Office hours are 8:00 a.m. to 4:30 p.m. Monday - Friday. Please note that voicemails left after 4:00 p.m. may not be returned until the following business day.  We are closed weekends and major holidays. You have access to a nurse at all times for urgent questions. Please call the main number to the clinic Dept: [phone] and follow the prompts.   For any non-urgent questions, you may also contact your provider using MyChart. We now offer e-Visits for anyone 85 and older to request care online for non-urgent symptoms. For details visit [email or web address].   Also download the MyChart app! Go to the app store, search "MyChart", open the app, select [facility], and log in with your MyChart username and password.  Due to Covid, a mask is required upon entering the hospital/clinic. If you do not have a mask, one will be given to you upon arrival. For doctor visits, patients may have 1 support person aged 85 or older with them. For treatment visits, patients cannot have anyone with them due to current Covid guidelines and our immunocompromised population.

## 2021-07-15 DIAGNOSIS — H04123 Dry eye syndrome of bilateral lacrimal glands: Secondary | ICD-10-CM | POA: Diagnosis not present

## 2021-07-15 DIAGNOSIS — H401131 Primary open-angle glaucoma, bilateral, mild stage: Secondary | ICD-10-CM | POA: Diagnosis not present

## 2021-07-17 ENCOUNTER — Other Ambulatory Visit: Payer: Self-pay

## 2021-07-17 DIAGNOSIS — C61 Malignant neoplasm of prostate: Secondary | ICD-10-CM

## 2021-07-17 DIAGNOSIS — C7951 Secondary malignant neoplasm of bone: Secondary | ICD-10-CM

## 2021-07-18 ENCOUNTER — Other Ambulatory Visit: Payer: Medicare PPO

## 2021-07-18 ENCOUNTER — Telehealth: Payer: Self-pay | Admitting: Hematology

## 2021-07-18 ENCOUNTER — Inpatient Hospital Stay: Payer: Medicare PPO | Attending: Hematology | Admitting: Hematology

## 2021-07-18 ENCOUNTER — Ambulatory Visit: Payer: Medicare PPO

## 2021-07-18 ENCOUNTER — Ambulatory Visit: Payer: Medicare PPO | Admitting: Hematology

## 2021-07-18 ENCOUNTER — Inpatient Hospital Stay: Payer: Medicare PPO

## 2021-07-18 ENCOUNTER — Other Ambulatory Visit: Payer: Self-pay

## 2021-07-18 VITALS — BP 149/57 | HR 75 | Temp 98.0°F | Resp 18 | Ht 70.0 in | Wt 177.3 lb

## 2021-07-18 DIAGNOSIS — D539 Nutritional anemia, unspecified: Secondary | ICD-10-CM | POA: Diagnosis not present

## 2021-07-18 DIAGNOSIS — I4891 Unspecified atrial fibrillation: Secondary | ICD-10-CM | POA: Diagnosis not present

## 2021-07-18 DIAGNOSIS — C7951 Secondary malignant neoplasm of bone: Secondary | ICD-10-CM | POA: Insufficient documentation

## 2021-07-18 DIAGNOSIS — Z8572 Personal history of non-Hodgkin lymphomas: Secondary | ICD-10-CM | POA: Diagnosis not present

## 2021-07-18 DIAGNOSIS — N401 Enlarged prostate with lower urinary tract symptoms: Secondary | ICD-10-CM | POA: Diagnosis not present

## 2021-07-18 DIAGNOSIS — I10 Essential (primary) hypertension: Secondary | ICD-10-CM | POA: Insufficient documentation

## 2021-07-18 DIAGNOSIS — C61 Malignant neoplasm of prostate: Secondary | ICD-10-CM | POA: Insufficient documentation

## 2021-07-18 DIAGNOSIS — Z79899 Other long term (current) drug therapy: Secondary | ICD-10-CM | POA: Insufficient documentation

## 2021-07-18 DIAGNOSIS — Z8673 Personal history of transient ischemic attack (TIA), and cerebral infarction without residual deficits: Secondary | ICD-10-CM | POA: Diagnosis not present

## 2021-07-18 DIAGNOSIS — R300 Dysuria: Secondary | ICD-10-CM | POA: Diagnosis not present

## 2021-07-18 DIAGNOSIS — G629 Polyneuropathy, unspecified: Secondary | ICD-10-CM | POA: Insufficient documentation

## 2021-07-18 LAB — CMP (CANCER CENTER ONLY)
ALT: 11 U/L (ref 0–44)
AST: 17 U/L (ref 15–41)
Albumin: 4 g/dL (ref 3.5–5.0)
Alkaline Phosphatase: 115 U/L (ref 38–126)
Anion gap: 8 (ref 5–15)
BUN: 33 mg/dL — ABNORMAL HIGH (ref 8–23)
CO2: 29 mmol/L (ref 22–32)
Calcium: 9.4 mg/dL (ref 8.9–10.3)
Chloride: 100 mmol/L (ref 98–111)
Creatinine: 1.36 mg/dL — ABNORMAL HIGH (ref 0.61–1.24)
GFR, Estimated: 48 mL/min — ABNORMAL LOW (ref >60)
Glucose, Bld: 132 mg/dL — ABNORMAL HIGH (ref 70–99)
Potassium: 4.4 mmol/L (ref 3.5–5.1)
Sodium: 137 mmol/L (ref 135–145)
Total Bilirubin: 0.5 mg/dL (ref 0.3–1.2)
Total Protein: 6.4 g/dL — ABNORMAL LOW (ref 6.5–8.1)

## 2021-07-18 LAB — URINALYSIS, COMPLETE (UACMP) WITH MICROSCOPIC
Bacteria, UA: NONE SEEN
Bilirubin Urine: NEGATIVE
Glucose, UA: NEGATIVE mg/dL
Hgb urine dipstick: NEGATIVE
Ketones, ur: NEGATIVE mg/dL
Leukocytes,Ua: NEGATIVE
Nitrite: NEGATIVE
Protein, ur: NEGATIVE mg/dL
Specific Gravity, Urine: 1.006 (ref 1.005–1.030)
pH: 5 (ref 5.0–8.0)

## 2021-07-18 LAB — CBC WITH DIFFERENTIAL (CANCER CENTER ONLY)
Abs Immature Granulocytes: 0.03 10*3/uL (ref 0.00–0.07)
Basophils Absolute: 0 10*3/uL (ref 0.0–0.1)
Basophils Relative: 1 %
Eosinophils Absolute: 0.5 10*3/uL (ref 0.0–0.5)
Eosinophils Relative: 8 %
HCT: 29.4 % — ABNORMAL LOW (ref 39.0–52.0)
Hemoglobin: 9.7 g/dL — ABNORMAL LOW (ref 13.0–17.0)
Immature Granulocytes: 1 %
Lymphocytes Relative: 11 %
Lymphs Abs: 0.7 10*3/uL (ref 0.7–4.0)
MCH: 31.9 pg (ref 26.0–34.0)
MCHC: 33 g/dL (ref 30.0–36.0)
MCV: 96.7 fL (ref 80.0–100.0)
Monocytes Absolute: 0.6 10*3/uL (ref 0.1–1.0)
Monocytes Relative: 10 %
Neutro Abs: 4.3 10*3/uL (ref 1.7–7.7)
Neutrophils Relative %: 69 %
Platelet Count: 174 10*3/uL (ref 150–400)
RBC: 3.04 MIL/uL — ABNORMAL LOW (ref 4.22–5.81)
RDW: 13.6 % (ref 11.5–15.5)
WBC Count: 6.1 10*3/uL (ref 4.0–10.5)
nRBC: 0 % (ref 0.0–0.2)

## 2021-07-18 NOTE — Telephone Encounter (Signed)
Scheduled appointment per 07/21 sch msg. Left message. 

## 2021-07-19 LAB — URINE CULTURE: Culture: NO GROWTH

## 2021-07-20 ENCOUNTER — Other Ambulatory Visit: Payer: Self-pay | Admitting: Hematology

## 2021-07-20 DIAGNOSIS — C61 Malignant neoplasm of prostate: Secondary | ICD-10-CM

## 2021-07-22 ENCOUNTER — Other Ambulatory Visit: Payer: Self-pay

## 2021-07-22 NOTE — Progress Notes (Signed)
Contacted pt's son to let him know Jacob Warner's urine culture was neg for UTI . Pt's son acknowledged and verbalized understanding.

## 2021-07-23 ENCOUNTER — Encounter: Payer: Self-pay | Admitting: Hematology

## 2021-07-23 MED ORDER — HYDROCODONE-ACETAMINOPHEN 5-325 MG PO TABS
1.0000 | ORAL_TABLET | Freq: Four times a day (QID) | ORAL | 0 refills | Status: DC | PRN
Start: 2021-07-23 — End: 2021-08-12

## 2021-07-25 ENCOUNTER — Encounter: Payer: Self-pay | Admitting: Hematology

## 2021-07-25 NOTE — Progress Notes (Signed)
HEMATOLOGY/ONCOLOGY CONSULTATION NOTE  Date of Service: 07/25/2021  Patient Care Team: [name], MD as PCP - General (Family Medicine) [name], MD (Inactive) as PCP - Cardiology (Cardiology) [name], MD as PCP - Electrophysiology (Cardiology)  CHIEF COMPLAINTS/PURPOSE OF CONSULTATION:  F/u for recently diagnosed prostate cancer with bone metastases Hx of Follicular Lymphoma  HISTORY OF PRESENTING ILLNESS:   Jacob Warner is a wonderful 87 y.o. male who has been referred to [place] by Dr. [name] for evaluation and management of abnormal CBC/history of follicular lymphoma. Pt is accompanied today by his son. The pt reports that he is doing well overall.   The pt reports that he was diagnosed with and treated for Follicular Lymphoma in 7371. He completed 5 months of Ritxuan, Cytoxan, and Prednisone. He has since been in remission.   A few months ago pt began having pain in his right-sided lower back. He found relief from this discomfort after he increased his water intake. The pain as since returned and remains despite the pt continuing to drink an adequate amount of water. Pt was referred to a Nephrologist after discussing his back pain with [name], his PCP. Pt has previously had kidney stones and has been told that he has BPH, but this has not disturbed his urinary habits. He denies seeing any blood in his urine, but was found to have hematuria on recent labs with [name]. Pt had an abdominal US in September, which showed a benign left kidney cyst. Pt has chronic back pain for which he receives steroid injections.    He has no history of thyroid disorders. Pt takes a baby Aspirin for his Afib and Pantoprazole daily. Pt has a history of dysphagia and has required esophageal dilation. He has lost nearly 40 lbs in the last six months. Pt has been eating less the last few months due to a decreased appetite and a change in routine. He also reports smaller stools in the  last 6-8 months. His stools used to be round, but are now much thinner. This is completely new and persistent.   Of note since the patient's last visit, pt has had [place] Abd ([id number]) completed on 09/04/2020 with results revealing "Stable 2.8 x 3.5 x 2.3 cm left superior pole simple renal cyst. Otherwise unremarkable upper abdominal ultrasound with the pancreas not visualized."  Most recent lab results (08/21/2020) of CBC w/diff and CMP is as follows: all values are WNL except for RBC at 3.05, Hgb at 9.9, HCT at 29.3, MCV at 96.0, Neutro Rel at 73.8, Lymphs Rel at 10.3, Lymphs Abs at 0.90K, Glucose at 103, BUN at 32, Creatinine at 1.46, GFR Est Non Af Am at 45, ALP at 140.  On review of systems, pt reports dysphagia, weight loss, low appetite, back pain, change in appearance of stools, neuropathy and denies nausea, abdominal pain, hematuria, fevers, chills, night sweats, new lumps/bumps, bloody stools, abdominal pain, testicular pain/swelling and any other symptoms.   On PMHx the pt reports Follicular Lymphoma, Cervical Spine Surgery, Spinal Cord Stimulator Implant, Afib, Pelvic fracture, Reflux, BPH, Kidney Stones.   INTERVAL HISTORY  Jacob Warner is a wonderful 87 y.o. male who is here today for evaluation and management of abnormal CBC/history of follicular lymphoma and recently diagnosed metastatic prostate cancer. The patient's last visit with [place] was on 03/13/2021. We are joined today by his son.  The pt reports that he has been doing better.No new new symptoms. No hematuria.  Overall functioning better. Some dysuria - UA and Ucx neg for infection. Lab results today 7/22 discussed with patient and son. 04/25/2021 PSA level is down to 278 from 1261 04/25/2021 LDH of 228.  On review of systems, pt reports intermittent lower left back and chest pain, weight gain, frequent urination and denies decreased appetite, weight loss, changes in bowel habits, changes in urination, blood in stools,  leg swelling, headaches, muscle cramps, and any other symptoms.  MEDICAL HISTORY:  Past Medical History:  Diagnosis Date   A-fib ([place])    Alopecia    Anemia    Bradycardia    Cancer ([place])    Cherry angioma    Echocardiogram    Echo 11/19: Mild concentric LVH, EF 50-55, normal wall motion, grade 1 diastolic dysfunction, mild AI, MAC, mild MR, normal RVSF, moderate TR, PASP 36   GERD (gastroesophageal reflux disease)    Glaucoma    Hypertension    Nuclear stress test    Nuclear stress test 11/19: not gated, inf and apical defect c/w small scar and/or soft tissue attenuation   Pelvic fracture ([facility])    Right BBB/left ant fasc block    Sick sinus syndrome ([place])    TIA (transient ischemic attack)    Xeroderma     SURGICAL HISTORY: Past Surgical History:  Procedure Laterality Date   APPENDECTOMY     CERVICAL SPINE SURGERY     EXPLORATORY LAPAROTOMY     LITHOTRIPSY     PACEMAKER PLACEMENT     PPM GENERATOR CHANGEOUT N/A 05/10/2020   Procedure: PPM GENERATOR CHANGEOUT;  Surgeon: [name], MD;  Location: [facility];  Service: Cardiovascular;  Laterality: N/A;   SPINAL CORD STIMULATOR IMPLANT Bilateral 2018   [name], MD PhD, [facility]    SOCIAL HISTORY: Social History   Socioeconomic History   Marital status: Married    Spouse name: Not on file   Number of children: Not on file   Years of education: Not on file   Highest education level: Not on file  Occupational History   Not on file  Tobacco Use   Smoking status: Never   Smokeless tobacco: Never  Vaping Use   Vaping Use: Never used  Substance and Sexual Activity   Alcohol use: No    Alcohol/week: 0.0 standard drinks   Drug use: No   Sexual activity: Not on file  Other Topics Concern   Not on file  Social History Narrative   Not on file   Social Determinants of Health   Financial Resource Strain: Not on file  Food Insecurity: Not on file  Transportation  Needs: Not on file  Physical Activity: Not on file  Stress: Not on file  Social Connections: Not on file  Intimate Partner Violence: Not on file    FAMILY HISTORY: Family History  Problem Relation Age of Onset   Brain cancer Sister     ALLERGIES:  is allergic to sulfamethoxazole-trimethoprim, sulfa antibiotics, and sulfacetamide sodium.  MEDICATIONS:  Current Outpatient Medications  Medication Sig Dispense Refill   acetaminophen (TYLENOL) 500 MG tablet Take 500-1,000 mg by mouth See admin instructions. 500 mg twice daily, 1000 mg at bedtime     azelastine (ASTELIN) 0.1 % nasal spray Place 1 spray into both nostrils 2 (two) times daily as needed for rhinitis.      calcium carbonate (TUMS - DOSED IN MG ELEMENTAL CALCIUM) 500 MG chewable tablet 1 tablet  denosumab (PROLIA) 60 MG/ML SOSY injection Inject 60 mg into the skin every 6 (six) months.     dutasteride (AVODART) 0.5 MG capsule Take 0.5 mg by mouth daily.     furosemide (LASIX) 40 MG tablet Take 40 mg by mouth daily.     gabapentin (NEURONTIN) 300 MG capsule Take 1 capsule (300 mg total) by mouth at bedtime. 90 capsule 1   hydroxypropyl methylcellulose / hypromellose (ISOPTO TEARS / GONIOVISC) 2.5 % ophthalmic solution Place 1 drop into both eyes as needed for dry eyes.     ibuprofen (ADVIL) 200 MG tablet 1 tablet with food or milk as needed     ketoconazole (NIZORAL) 2 % shampoo Apply 1 application topically 2 (two) times a week.      lactulose (CHRONULAC) 10 GM/15ML solution Take 10 g by mouth daily.  0   latanoprost (XALATAN) 0.005 % ophthalmic solution Place 1 drop into both eyes at bedtime.   11   magnesium oxide (MAG-OX) 400 MG tablet 1 tablet     Magnesium Oxide 400 (240 Mg) MG TABS Take 1 tablet by mouth 2 (two) times daily.  7   pantoprazole (PROTONIX) 40 MG tablet Take 1 tablet by mouth daily.     RESTASIS 0.05 % ophthalmic emulsion Place 1 drop into both eyes daily.   11   ROCKLATAN 0.02-0.005 % SOLN       topiramate (TOPAMAX) 25 MG tablet      traZODone (DESYREL) 50 MG tablet Take 50 mg by mouth at bedtime.   11   White Petrolatum-Mineral Oil (EYE LUBRICANT) OINT Apply 1 strip to eye at bedtime.     cephALEXin (KEFLEX) 500 MG capsule 1 capsule (Patient not taking: Reported on 07/18/2021)     HYDROcodone-acetaminophen (NORCO/VICODIN) 5-325 MG tablet Take 1 tablet by mouth every 6 (six) hours as needed for moderate pain or severe pain. 60 tablet 0   meloxicam (MOBIC) 15 MG tablet Take 15 mg by mouth daily. (Patient not taking: Reported on 07/18/2021)     montelukast (SINGULAIR) 10 MG tablet 1 tablet     nitroGLYCERIN (NITROSTAT) 0.4 MG SL tablet Place 1 tablet (0.4 mg total) under the tongue every 5 (five) minutes as needed for chest pain. 25 tablet 5   vitamin B-12 (CYANOCOBALAMIN) 1000 MCG tablet Take 1,000 mcg by mouth daily.  (Patient not taking: Reported on 07/18/2021)     No current facility-administered medications for this visit.    REVIEW OF SYSTEMS:   10 Point review of Systems was done is negative except as noted above.  PHYSICAL EXAMINATION: ECOG PERFORMANCE STATUS: 1 - Symptomatic but completely ambulatory   Exam was given in a chair.   GENERAL:alert, in no acute distress and comfortable SKIN: no acute rashes, no significant lesions EYES: conjunctiva are pink and non-injected, sclera anicteric OROPHARYNX: MMM, no exudates, no oropharyngeal erythema or ulceration NECK: supple, no JVD LYMPH:  no palpable lymphadenopathy in the cervical, axillary or inguinal regions LUNGS: clear to auscultation b/l with normal respiratory effort HEART: regular rate & rhythm ABDOMEN:  normoactive bowel sounds , non tender, not distended. Extremity: no pedal edema PSYCH: alert & oriented x 3 with fluent speech NEURO: no focal motor/sensory deficits  LABORATORY DATA:  I have reviewed the data as listed  . CBC Latest Ref Rng & Units 07/18/2021 06/20/2021 05/24/2021  WBC 4.0 - 10.5 K/uL 6.1 5.5 4.9   Hemoglobin 13.0 - 17.0 g/dL 9.7(L) 9.4(L) 9.9(L)  Hematocrit 39.0 - 52.0 % 29.4(L)  28.4(L) 29.8(L)  Platelets 150 - 400 K/uL 174 191 199   . CBC    Component Value Date/Time   WBC 6.1 07/18/2021 1412   WBC 4.9 05/24/2021 1123   RBC 3.04 (L) 07/18/2021 1412   HGB 9.7 (L) 07/18/2021 1412   HGB 11.4 (L) 04/20/2020 1106   HGB 12.2 (L) 07/08/2017 1336   HCT 29.4 (L) 07/18/2021 1412   HCT 34.1 (L) 09/17/2020 1205   HCT 35.6 (L) 07/08/2017 1336   PLT 174 07/18/2021 1412   PLT 221 04/20/2020 1106   MCV 96.7 07/18/2021 1412   MCV 97 04/20/2020 1106   MCV 96.2 07/08/2017 1336   MCH 31.9 07/18/2021 1412   MCHC 33.0 07/18/2021 1412   RDW 13.6 07/18/2021 1412   RDW 13.2 04/20/2020 1106   RDW 13.9 07/08/2017 1336   LYMPHSABS 0.7 07/18/2021 1412   LYMPHSABS 1.3 04/20/2020 1106   LYMPHSABS 0.8 (L) 07/08/2017 1336   MONOABS 0.6 07/18/2021 1412   MONOABS 0.5 07/08/2017 1336   EOSABS 0.5 07/18/2021 1412   EOSABS 0.2 04/20/2020 1106   BASOSABS 0.0 07/18/2021 1412   BASOSABS 0.0 04/20/2020 1106   BASOSABS 0.0 07/08/2017 1336    . CMP Latest Ref Rng & Units 07/18/2021 06/20/2021 05/24/2021  Glucose 70 - 99 mg/dL 132(H) 101(H) 88  BUN 8 - 23 mg/dL 33(H) 37(H) 33(H)  Creatinine 0.61 - 1.24 mg/dL 1.36(H) 1.32(H) 1.15  Sodium 135 - 145 mmol/L 137 137 138  Potassium 3.5 - 5.1 mmol/L 4.4 4.6 4.4  Chloride 98 - 111 mmol/L 100 100 99  CO2 22 - 32 mmol/L _0 Calcium 8.9 - 10.3 mg/dL 9.4 9.3 9.9  Total Protein 6.5 - 8.1 g/dL 6.4(L) 6.2(L) 6.4(L)  Total Bilirubin 0.3 - 1.2 mg/dL 0.5 0.4 0.3  Alkaline Phos 38 - 126 U/L 115 119 126  AST 15 - 41 U/L 17 13(L) 14(L)  ALT 0 - 44 U/L _1 Component     Latest Ref Rng & Units 02/04/2021  IgG (Immunoglobin G), Serum     603 - 1,613 mg/dL 569 (L)  IgA     61 - 437 mg/dL 119  IgM (Immunoglobulin M), Srm     15 - 143 mg/dL 32  Total Protein ELP     6.0 - 8.5 g/dL 6.0  Albumin SerPl Elph-Mcnc     2.9 - 4.4 g/dL 3.4  Alpha 1     0.0 - 0.4  g/dL 0.3  Alpha2 Glob SerPl Elph-Mcnc     0.4 - 1.0 g/dL 0.9  B-Globulin SerPl Elph-Mcnc     0.7 - 1.3 g/dL 0.8  Gamma Glob SerPl Elph-Mcnc     0.4 - 1.8 g/dL 0.6  M Protein SerPl Elph-Mcnc     Not Observed g/dL Not Observed  Globulin, Total     2.2 - 3.9 g/dL 2.6  Albumin/Glob SerPl     0.7 - 1.7 1.4  IFE 1      Comment  Please Note (HCV):      Comment  Iron     42 - 163 ug/dL 88  TIBC     202 - 409 ug/dL 253  Saturation Ratios     20 - 55 % 35  UIBC     117 - 376 ug/dL 165  PSA, Free     N/A ng/mL >50.00  PSA, Free Pct     % >4.2  Prostate Specific Ag, Serum     0.0 -  4.0 ng/mL 1,203.0 (H)  Vitamin D, 25-Hydroxy     30 - 100 ng/mL 83.54  Ferritin     24 - 336 ng/mL 262  CEA ([facility]In House)     0.00 - 5.00 ng/mL 1.38  LDH     98 - 192 U/L 446 (H)    RADIOGRAPHIC STUDIES: I have personally reviewed the radiological images as listed and agreed with the findings in the report. No results found.  ASSESSMENT & PLAN:   87 yo with   1) Newly diagnosed Metastatic prostate cancer with bone metastases and especially left hip pain. 2) Bone metastases 3) h/o follicular lymphoma in 8527 rx with R-CVP 4) Mild macrocytic anemia - likely from metastatic prostate cancer.   PLAN: -Discussed pt labwork today, 7/22./2022; counts and chemistries stable, LDH improved. PSA down to 278 from 1261 -Recommended pt continue to be cautious with bending and twisting movements. -Advised pt we will continue with current plan as long as PSA levels keep decreasing. -Continue Lupron q35month at this time. -Continue Aredia qmonthly to reduce risks of fractures or bone hardening. -Recommended pt take 2000 IU Vitamin D daily. Advised pt he can take up to 5000 IU daily. -Advised pt he can use OTC Voltaren gel for application over painful area in back. Recommended OTC 4% Lidocaine patch for local pain control. -Will see back in 3 months with labs.   FOLLOW UP: Please schedule monthly labs  and pamidronate infusions x 4 Lupron injections every 3 months please schedule x4  MD visit in 3 months with next Lupron shot   The total time spent in the appointment was 20 minutes and more than 50% was on counseling and direct patient cares.  All of the patient's questions were answered with apparent satisfaction. The patient knows to call the clinic with any problems, questions or concerns.  GSullivan LoneMD MSt. ClairAAHIVMS SDurango Outpatient Surgery CenterCArkansas Children'S Northwest Inc.Hematology/Oncology Physician CSaint [facility] (Office):       [phone](Work cell):  [phone](Fax):           [phone] I, RReinaldo Raddle am acting as scribe for Dr. GSullivan Lone MD.   .I have reviewed the above documentation for accuracy and completeness, and I agree with the above. .Brunetta GeneraMD

## 2021-08-11 ENCOUNTER — Encounter: Payer: Self-pay | Admitting: Hematology

## 2021-08-12 ENCOUNTER — Other Ambulatory Visit: Payer: Self-pay

## 2021-08-12 ENCOUNTER — Ambulatory Visit (INDEPENDENT_AMBULATORY_CARE_PROVIDER_SITE_OTHER): Payer: Medicare PPO

## 2021-08-12 DIAGNOSIS — C61 Malignant neoplasm of prostate: Secondary | ICD-10-CM

## 2021-08-12 DIAGNOSIS — I495 Sick sinus syndrome: Secondary | ICD-10-CM | POA: Diagnosis not present

## 2021-08-12 DIAGNOSIS — C7951 Secondary malignant neoplasm of bone: Secondary | ICD-10-CM

## 2021-08-13 ENCOUNTER — Encounter: Payer: Self-pay | Admitting: Hematology

## 2021-08-13 MED ORDER — HYDROCODONE-ACETAMINOPHEN 5-325 MG PO TABS
1.0000 | ORAL_TABLET | Freq: Four times a day (QID) | ORAL | 0 refills | Status: DC | PRN
Start: 2021-08-13 — End: 2021-09-07

## 2021-08-14 ENCOUNTER — Other Ambulatory Visit: Payer: Self-pay

## 2021-08-14 DIAGNOSIS — C7951 Secondary malignant neoplasm of bone: Secondary | ICD-10-CM

## 2021-08-14 LAB — CUP PACEART REMOTE DEVICE CHECK
Battery Remaining Longevity: 90 mo
Battery Remaining Percentage: 100 %
Brady Statistic RA Percent Paced: 49 %
Brady Statistic RV Percent Paced: 28 %
Date Time Interrogation Session: 20220815040000
Implantable Lead Implant Date: 20130926
Implantable Lead Implant Date: 20130926
Implantable Lead Location: 753859
Implantable Lead Location: 753860
Implantable Lead Model: 4456
Implantable Lead Model: 4476
Implantable Lead Serial Number: 473023
Implantable Lead Serial Number: 523746
Implantable Pulse Generator Implant Date: 20210513
Lead Channel Impedance Value: 331 Ohm
Lead Channel Impedance Value: 387 Ohm
Lead Channel Pacing Threshold Amplitude: 1.8 V
Lead Channel Pacing Threshold Pulse Width: 0.4 ms
Lead Channel Setting Pacing Amplitude: 2.5 V
Lead Channel Setting Pacing Amplitude: 3.6 V
Lead Channel Setting Pacing Pulse Width: 0.4 ms
Lead Channel Setting Sensing Sensitivity: 2.5 mV
Pulse Gen Serial Number: 528325

## 2021-08-15 ENCOUNTER — Inpatient Hospital Stay: Payer: Medicare PPO

## 2021-08-15 ENCOUNTER — Inpatient Hospital Stay: Payer: Medicare PPO | Attending: Hematology

## 2021-08-15 ENCOUNTER — Other Ambulatory Visit: Payer: Self-pay

## 2021-08-15 DIAGNOSIS — C61 Malignant neoplasm of prostate: Secondary | ICD-10-CM | POA: Insufficient documentation

## 2021-08-15 DIAGNOSIS — D539 Nutritional anemia, unspecified: Secondary | ICD-10-CM | POA: Diagnosis not present

## 2021-08-15 DIAGNOSIS — C7951 Secondary malignant neoplasm of bone: Secondary | ICD-10-CM | POA: Insufficient documentation

## 2021-08-15 LAB — CBC WITH DIFFERENTIAL (CANCER CENTER ONLY)
Abs Immature Granulocytes: 0.02 10*3/uL (ref 0.00–0.07)
Basophils Absolute: 0 10*3/uL (ref 0.0–0.1)
Basophils Relative: 0 %
Eosinophils Absolute: 0.1 10*3/uL (ref 0.0–0.5)
Eosinophils Relative: 1 %
HCT: 27.6 % — ABNORMAL LOW (ref 39.0–52.0)
Hemoglobin: 9.1 g/dL — ABNORMAL LOW (ref 13.0–17.0)
Immature Granulocytes: 0 %
Lymphocytes Relative: 10 %
Lymphs Abs: 0.7 10*3/uL (ref 0.7–4.0)
MCH: 31.9 pg (ref 26.0–34.0)
MCHC: 33 g/dL (ref 30.0–36.0)
MCV: 96.8 fL (ref 80.0–100.0)
Monocytes Absolute: 0.6 10*3/uL (ref 0.1–1.0)
Monocytes Relative: 8 %
Neutro Abs: 5.9 10*3/uL (ref 1.7–7.7)
Neutrophils Relative %: 81 %
Platelet Count: 149 10*3/uL — ABNORMAL LOW (ref 150–400)
RBC: 2.85 MIL/uL — ABNORMAL LOW (ref 4.22–5.81)
RDW: 13.9 % (ref 11.5–15.5)
WBC Count: 7.2 10*3/uL (ref 4.0–10.5)
nRBC: 0 % (ref 0.0–0.2)

## 2021-08-15 LAB — CMP (CANCER CENTER ONLY)
ALT: 11 U/L (ref 0–44)
AST: 16 U/L (ref 15–41)
Albumin: 3.3 g/dL — ABNORMAL LOW (ref 3.5–5.0)
Alkaline Phosphatase: 245 U/L — ABNORMAL HIGH (ref 38–126)
Anion gap: 12 (ref 5–15)
BUN: 35 mg/dL — ABNORMAL HIGH (ref 8–23)
CO2: 25 mmol/L (ref 22–32)
Calcium: 9.4 mg/dL (ref 8.9–10.3)
Chloride: 102 mmol/L (ref 98–111)
Creatinine: 1.36 mg/dL — ABNORMAL HIGH (ref 0.61–1.24)
GFR, Estimated: 48 mL/min — ABNORMAL LOW (ref >60)
Glucose, Bld: 107 mg/dL — ABNORMAL HIGH (ref 70–99)
Potassium: 3.9 mmol/L (ref 3.5–5.1)
Sodium: 139 mmol/L (ref 135–145)
Total Bilirubin: 0.8 mg/dL (ref 0.3–1.2)
Total Protein: 6.3 g/dL — ABNORMAL LOW (ref 6.5–8.1)

## 2021-08-15 MED ORDER — HEPARIN SOD (PORK) LOCK FLUSH 100 UNIT/ML IV SOLN: 500.0000 [IU] | Freq: Once | INTRAVENOUS | Status: DC | PRN

## 2021-08-15 MED ORDER — SODIUM CHLORIDE 0.9% FLUSH: 3.0000 mL | Freq: Once | INTRAVENOUS | Status: DC | PRN

## 2021-08-15 MED ORDER — SODIUM CHLORIDE 0.9% FLUSH: 10.0000 mL | Freq: Once | INTRAVENOUS | Status: DC | PRN

## 2021-08-15 MED ORDER — SODIUM CHLORIDE 0.9 % IV SOLN: INTRAVENOUS | Status: DC

## 2021-08-15 MED ORDER — HEPARIN SOD (PORK) LOCK FLUSH 100 UNIT/ML IV SOLN: 250.0000 [IU] | Freq: Once | INTRAVENOUS | Status: DC | PRN

## 2021-08-15 MED ORDER — LEUPROLIDE ACETATE (3 MONTH) 22.5 MG ~~LOC~~ KIT
22.5000 mg | PACK | Freq: Once | SUBCUTANEOUS | Status: AC
  Administered 2021-08-15: 22.5 mg via SUBCUTANEOUS
  Filled 2021-08-15: qty 22.5

## 2021-08-15 MED ORDER — ALTEPLASE 2 MG IJ SOLR: 2.0000 mg | Freq: Once | INTRAMUSCULAR | Status: DC | PRN

## 2021-08-15 MED ORDER — SODIUM CHLORIDE 0.9 % IV SOLN
60.0000 mg | Freq: Once | INTRAVENOUS | Status: AC
  Administered 2021-08-15: 60 mg via INTRAVENOUS
  Filled 2021-08-15: qty 20

## 2021-08-15 NOTE — Patient Instructions (Signed)
[facility] ONCOL[facility]  Discharge Instructions: Thank you for choosing [facility] to provide your oncology and hematology care.   If you have a lab appointment with the [facility], please go directly to the [facility] and check in at the registration area.   Wear comfortable clothing and clothing appropriate for easy access to any Portacath or PICC line.   We strive to give you quality time with your provider. You may need to reschedule your appointment if you arrive late (15 or more minutes).  Arriving late affects you and other patients whose appointments are after yours.  Also, if you miss three or more appointments without notifying the office, you may be dismissed from the clinic at the provider's discretion.      For prescription refill requests, have your pharmacy contact our office and allow 72 hours for refills to be completed.    Today you received the following- aredia   To help prevent nausea and vomiting after your treatment, we encourage you to take your nausea medication as directed.  BELOW ARE SYMPTOMS THAT SHOULD BE REPORTED IMMEDIATELY: *FEVER GREATER THAN 100.4 F (38 C) OR HIGHER *CHILLS OR SWEATING *NAUSEA AND VOMITING THAT IS NOT CONTROLLED WITH YOUR NAUSEA MEDICATION *UNUSUAL SHORTNESS OF BREATH *UNUSUAL BRUISING OR BLEEDING *URINARY PROBLEMS (pain or burning when urinating, or frequent urination) *BOWEL PROBLEMS (unusual diarrhea, constipation, pain near the anus) TENDERNESS IN MOUTH AND THROAT WITH OR WITHOUT PRESENCE OF ULCERS (sore throat, sores in mouth, or a toothache) UNUSUAL RASH, SWELLING OR PAIN  UNUSUAL VAGINAL DISCHARGE OR ITCHING   Items with * indicate a potential emergency and should be followed up as soon as possible or go to the Emergency Department if any problems should occur.  Please show the CHEMOTHERAPY ALERT CARD or IMMUNOTHERAPY ALERT CARD at check-in to the Emergency Department and triage  nurse.  Should you have questions after your visit or need to cancel or reschedule your appointment, please contact [facility]  Dept: [phone]  and follow the prompts.  Office hours are 8:00 a.m. to 4:30 p.m. Monday - Friday. Please note that voicemails left after 4:00 p.m. may not be returned until the following business day.  We are closed weekends and major holidays. You have access to a nurse at all times for urgent questions. Please call the main number to the clinic Dept: [phone] and follow the prompts.   For any non-urgent questions, you may also contact your provider using MyChart. We now offer e-Visits for anyone 68 and older to request care online for non-urgent symptoms. For details visit [email or web address].   Also download the MyChart app! Go to the app store, search "MyChart", open the app, select [facility], and log in with your MyChart username and password.  Due to Covid, a mask is required upon entering the hospital/clinic. If you do not have a mask, one will be given to you upon arrival. For doctor visits, patients may have 1 support person aged 72 or older with them. For treatment visits, patients cannot have anyone with them due to current Covid guidelines and our immunocompromised population.

## 2021-08-16 LAB — PSA, TOTAL AND FREE
PSA, Free Pct: >6.6 %
PSA, Free: >50 ng/mL
Prostate Specific Ag, Serum: 755 ng/mL — ABNORMAL HIGH (ref 0.0–4.0)

## 2021-08-30 NOTE — Progress Notes (Signed)
Remote pacemaker transmission.   

## 2021-09-03 ENCOUNTER — Ambulatory Visit: Payer: Medicare PPO | Admitting: Internal Medicine

## 2021-09-03 ENCOUNTER — Other Ambulatory Visit: Payer: Self-pay

## 2021-09-03 VITALS — BP 122/50 | HR 55 | Ht 70.0 in | Wt 176.2 lb

## 2021-09-03 DIAGNOSIS — I495 Sick sinus syndrome: Secondary | ICD-10-CM | POA: Diagnosis not present

## 2021-09-03 DIAGNOSIS — Z950 Presence of cardiac pacemaker: Secondary | ICD-10-CM

## 2021-09-03 NOTE — Patient Instructions (Signed)
Medication Instructions:  Your physician recommends that you continue on your current medications as directed. Please refer to the Current Medication list given to you today.  Labwork: None ordered.  Testing/Procedures: None ordered.  Follow-Up: Your physician wants you to follow-up in: one year with Cristopher Peru, MD or one of the following Advanced Practice Providers on your designated Care Team:   Tommye Standard, [place] Legrand Como "Jonni Sanger" Chalmers Cater, [place]  Remote monitoring is used to monitor your Pacemaker from home. This monitoring reduces the number of office visits required to check your device to one time per year. It allows [place] to keep an eye on the functioning of your device to ensure it is working properly. You are scheduled for a device check from home on 11/11/2021. You may send your transmission at any time that day. If you have a wireless device, the transmission will be sent automatically. After your physician reviews your transmission, you will receive a postcard with your next transmission date.  Any Other Special Instructions Will Be Listed Below (If Applicable).  If you need a refill on your cardiac medications before your next appointment, please call your pharmacy.

## 2021-09-03 NOTE — Progress Notes (Signed)
HPI Mr. Mciver returns today for followup. He is a pleasant 87 yo man with a h/o sinus node dysfunction, high grade heart block, s/p PPM insertion, PAF, and is s/p PPM gen change. He denies chest pain, palpitations, or sob. He admits to being sedentary. He has mild peripheral edema due to venous insufficiency. he has not gotten covid and has remained out of the hospital.  Allergies  Allergen Reactions   Sulfamethoxazole-Trimethoprim     Other reaction(s): rash   Sulfa Antibiotics Hives and Rash    Bactrim Other reaction(s): rash   Sulfacetamide Sodium Hives and Rash    Bactrim     Current Outpatient Medications  Medication Sig Dispense Refill   acetaminophen (TYLENOL) 500 MG tablet Take 500-1,000 mg by mouth See admin instructions. 500 mg twice daily, 1000 mg at bedtime     azelastine (ASTELIN) 0.1 % nasal spray Place 1 spray into both nostrils 2 (two) times daily as needed for rhinitis.      calcium carbonate (TUMS - DOSED IN MG ELEMENTAL CALCIUM) 500 MG chewable tablet 1 tablet     cephALEXin (KEFLEX) 500 MG capsule      denosumab (PROLIA) 60 MG/ML SOSY injection Inject 60 mg into the skin every 6 (six) months.     dutasteride (AVODART) 0.5 MG capsule Take 0.5 mg by mouth daily.     furosemide (LASIX) 40 MG tablet Take 40 mg by mouth daily.     gabapentin (NEURONTIN) 300 MG capsule Take 1 capsule (300 mg total) by mouth at bedtime. 90 capsule 1   HYDROcodone-acetaminophen (NORCO/VICODIN) 5-325 MG tablet Take 1 tablet by mouth every 6 (six) hours as needed for moderate pain or severe pain. 60 tablet 0   hydroxypropyl methylcellulose / hypromellose (ISOPTO TEARS / GONIOVISC) 2.5 % ophthalmic solution Place 1 drop into both eyes as needed for dry eyes.     ibuprofen (ADVIL) 200 MG tablet 1 tablet with food or milk as needed     ketoconazole (NIZORAL) 2 % shampoo Apply 1 application topically 2 (two) times a week.      lactulose (CHRONULAC) 10 GM/15ML solution Take 10 g by mouth  daily.  0   latanoprost (XALATAN) 0.005 % ophthalmic solution Place 1 drop into both eyes at bedtime.   11   magnesium oxide (MAG-OX) 400 MG tablet 1 tablet     Magnesium Oxide 400 (240 Mg) MG TABS Take 1 tablet by mouth 2 (two) times daily.  7   meloxicam (MOBIC) 15 MG tablet Take 15 mg by mouth daily.     montelukast (SINGULAIR) 10 MG tablet 1 tablet     pantoprazole (PROTONIX) 40 MG tablet Take 1 tablet by mouth daily.     RESTASIS 0.05 % ophthalmic emulsion Place 1 drop into both eyes daily.   11   ROCKLATAN 0.02-0.005 % SOLN      topiramate (TOPAMAX) 25 MG tablet      traZODone (DESYREL) 50 MG tablet Take 50 mg by mouth at bedtime.   11   vitamin B-12 (CYANOCOBALAMIN) 1000 MCG tablet Take 1,000 mcg by mouth daily.     White Petrolatum-Mineral Oil (EYE LUBRICANT) OINT Apply 1 strip to eye at bedtime.     nitroGLYCERIN (NITROSTAT) 0.4 MG SL tablet Place 1 tablet (0.4 mg total) under the tongue every 5 (five) minutes as needed for chest pain. 25 tablet 5   No current facility-administered medications for this visit.     Past Medical  History:  Diagnosis Date   A-fib ([place])    Alopecia    Anemia    Bradycardia    Cancer (HCC)    Cherry angioma    Echocardiogram    Echo 11/19: Mild concentric LVH, EF 50-55, normal wall motion, grade 1 diastolic dysfunction, mild AI, MAC, mild MR, normal RVSF, moderate TR, PASP 36   GERD (gastroesophageal reflux disease)    Glaucoma    Hypertension    Nuclear stress test    Nuclear stress test 11/19: not gated, inf and apical defect c/w small scar and/or soft tissue attenuation   Pelvic fracture (HCC)    Right BBB/left ant fasc block    Sick sinus syndrome (HCC)    TIA (transient ischemic attack)    Xeroderma     ROS:   All systems reviewed and negative except as noted in the HPI.   Past Surgical History:  Procedure Laterality Date   APPENDECTOMY     CERVICAL SPINE SURGERY     EXPLORATORY LAPAROTOMY     LITHOTRIPSY     PACEMAKER  PLACEMENT     PPM GENERATOR CHANGEOUT N/A 05/10/2020   Procedure: PPM GENERATOR CHANGEOUT;  Surgeon: [name], MD;  Location: [facility];  Service: Cardiovascular;  Laterality: N/A;   SPINAL CORD STIMULATOR IMPLANT Bilateral 2018   [name], MD PhD, [facility]     Family History  Problem Relation Age of Onset   Brain cancer Sister      Social History   Socioeconomic History   Marital status: Married    Spouse name: Not on file   Number of children: Not on file   Years of education: Not on file   Highest education level: Not on file  Occupational History   Not on file  Tobacco Use   Smoking status: Never   Smokeless tobacco: Never  Vaping Use   Vaping Use: Never used  Substance and Sexual Activity   Alcohol use: No    Alcohol/week: 0.0 standard drinks   Drug use: No   Sexual activity: Not on file  Other Topics Concern   Not on file  Social History Narrative   Not on file   Social Determinants of Health   Financial Resource Strain: Not on file  Food Insecurity: Not on file  Transportation Needs: Not on file  Physical Activity: Not on file  Stress: Not on file  Social Connections: Not on file  Intimate Partner Violence: Not on file     BP (!) 122/50   Pulse (!) 55   Ht _0  (1.778 m)   Wt 176 lb 3.2 oz (79.9 kg)   SpO2 94%   BMI 25.28 kg/m   Physical Exam:  Well appearing elderly man, NAD HEENT: Unremarkable Neck:  No JVD, no thyromegally Lymphatics:  No adenopathy Back:  No CVA tenderness Lungs:  Clear with no wheezes HEART:  Regular rate rhythm, no murmurs, no rubs, no clicks Abd:  soft, positive bowel sounds, no organomegally, no rebound, no guarding Ext:  2 plus pulses, no edema, no cyanosis, no clubbing Skin:  No rashes no nodules Neuro:  CN II through XII intact, motor grossly intact  EKG - NSR with atrial pacing and RBBB  DEVICE  Normal device function.  See PaceArt for details.    Assess/Plan:  Sinus node dysfunction - he is asymptomatic s/p PPM. His underlying rate today was in the low 40's. PPM - his [employer]  DDD PM is working normally. Anemia and falls - he is not on systemic anti-coag due to both. He will undergo watchful waiting. PAF - he has not had any atrial fib in over 1 year. We will follow.   Carleene Overlie Cleave Ternes,MD

## 2021-09-07 ENCOUNTER — Other Ambulatory Visit: Payer: Self-pay | Admitting: Physician Assistant

## 2021-09-07 DIAGNOSIS — C61 Malignant neoplasm of prostate: Secondary | ICD-10-CM

## 2021-09-09 ENCOUNTER — Other Ambulatory Visit: Payer: Self-pay

## 2021-09-09 ENCOUNTER — Encounter: Payer: Self-pay | Admitting: Hematology

## 2021-09-09 MED ORDER — HYDROCODONE-ACETAMINOPHEN 5-325 MG PO TABS: 1.0000 | ORAL_TABLET | Freq: Four times a day (QID) | ORAL | 0 refills | Status: DC | PRN

## 2021-09-19 ENCOUNTER — Other Ambulatory Visit: Payer: Self-pay

## 2021-09-19 ENCOUNTER — Inpatient Hospital Stay: Payer: Medicare PPO

## 2021-09-19 ENCOUNTER — Inpatient Hospital Stay: Payer: Medicare PPO | Attending: Hematology

## 2021-09-19 VITALS — BP 146/62 | HR 50 | Temp 97.8°F | Resp 17

## 2021-09-19 DIAGNOSIS — C7951 Secondary malignant neoplasm of bone: Secondary | ICD-10-CM

## 2021-09-19 DIAGNOSIS — C61 Malignant neoplasm of prostate: Secondary | ICD-10-CM

## 2021-09-19 LAB — CMP (CANCER CENTER ONLY)
ALT: 6 U/L (ref 0–44)
AST: 25 U/L (ref 15–41)
Albumin: 3.7 g/dL (ref 3.5–5.0)
Alkaline Phosphatase: 290 U/L — ABNORMAL HIGH (ref 38–126)
Anion gap: 10 (ref 5–15)
BUN: 33 mg/dL — ABNORMAL HIGH (ref 8–23)
CO2: 27 mmol/L (ref 22–32)
Calcium: 9.4 mg/dL (ref 8.9–10.3)
Chloride: 102 mmol/L (ref 98–111)
Creatinine: 1.39 mg/dL — ABNORMAL HIGH (ref 0.61–1.24)
GFR, Estimated: 47 mL/min — ABNORMAL LOW (ref >60)
Glucose, Bld: 96 mg/dL (ref 70–99)
Potassium: 4.7 mmol/L (ref 3.5–5.1)
Sodium: 139 mmol/L (ref 135–145)
Total Bilirubin: 0.4 mg/dL (ref 0.3–1.2)
Total Protein: 6.1 g/dL — ABNORMAL LOW (ref 6.5–8.1)

## 2021-09-19 LAB — CBC WITH DIFFERENTIAL (CANCER CENTER ONLY)
Abs Immature Granulocytes: 0.03 10*3/uL (ref 0.00–0.07)
Basophils Absolute: 0 10*3/uL (ref 0.0–0.1)
Basophils Relative: 1 %
Eosinophils Absolute: 0.1 10*3/uL (ref 0.0–0.5)
Eosinophils Relative: 2 %
HCT: 27.7 % — ABNORMAL LOW (ref 39.0–52.0)
Hemoglobin: 8.9 g/dL — ABNORMAL LOW (ref 13.0–17.0)
Immature Granulocytes: 1 %
Lymphocytes Relative: 11 %
Lymphs Abs: 0.6 10*3/uL — ABNORMAL LOW (ref 0.7–4.0)
MCH: 31.4 pg (ref 26.0–34.0)
MCHC: 32.1 g/dL (ref 30.0–36.0)
MCV: 97.9 fL (ref 80.0–100.0)
Monocytes Absolute: 0.6 10*3/uL (ref 0.1–1.0)
Monocytes Relative: 10 %
Neutro Abs: 4.5 10*3/uL (ref 1.7–7.7)
Neutrophils Relative %: 75 %
Platelet Count: 165 10*3/uL (ref 150–400)
RBC: 2.83 MIL/uL — ABNORMAL LOW (ref 4.22–5.81)
RDW: 14.6 % (ref 11.5–15.5)
WBC Count: 5.8 10*3/uL (ref 4.0–10.5)
nRBC: 0 % (ref 0.0–0.2)

## 2021-09-19 MED ORDER — SODIUM CHLORIDE 0.9 % IV SOLN
60.0000 mg | Freq: Once | INTRAVENOUS | Status: DC
  Filled 2021-09-19 (×2): qty 10

## 2021-09-19 MED ORDER — SODIUM CHLORIDE 0.9 % IV SOLN
60.0000 mg | Freq: Once | INTRAVENOUS | Status: AC
  Administered 2021-09-19: 60 mg via INTRAVENOUS
  Filled 2021-09-19: qty 20

## 2021-09-19 NOTE — Patient Instructions (Signed)
Pamidronate Injection What is this medication? PAMIDRONATE (pa mi DROE nate) slows calcium loss from bones. It treats Paget's disease and high calcium levels in the blood from some kinds of cancer. It may be used in other people at risk for bone loss. This medicine may be used for other purposes; ask your health care provider or pharmacist if you have questions. COMMON BRAND NAME(S): Aredia What should I tell my care team before I take this medication? They need to know if you have any of these conditions: bleeding disorder cancer dental disease kidney disease low levels of calcium or other minerals in the blood low red blood cell counts receiving steroids like dexamethasone or prednisone an unusual or allergic reaction to pamidronate, other drugs, foods, dyes or preservatives pregnant or trying to get pregnant breast-feeding How should I use this medication? This drug is injected into a vein. It is given by a health care provider in a hospital or clinic setting. Talk to your health care provider about the use of this drug in children. Special care may be needed. Overdosage: If you think you have taken too much of this medicine contact a poison control center or emergency room at once. NOTE: This medicine is only for you. Do not share this medicine with others. What if I miss a dose? Keep appointments for follow-up doses. It is important not to miss your dose. Call your health care provider if you are unable to keep an appointment. What may interact with this medication? certain antibiotics given by injection medicines for inflammation or pain like ibuprofen, naproxen some diuretics like bumetanide, furosemide cyclosporine parathyroid hormone tacrolimus teriparatide thalidomide This list may not describe all possible interactions. Give your health care provider a list of all the medicines, herbs, non-prescription drugs, or dietary supplements you use. Also tell them if you smoke,  drink alcohol, or use illegal drugs. Some items may interact with your medicine. What should I watch for while using this medication? Visit your health care provider for regular checks on your progress. It may be some time before you see the benefit from this drug. Some people who take this drug have severe bone, joint, or muscle pain. This drug may also increase your risk for jaw problems or a broken thigh bone. Tell your health care provider right away if you have severe pain in your jaw, bones, joints, or muscles. Tell you health care provider if you have any pain that does not go away or that gets worse. Tell your dentist and dental surgeon that you are taking this drug. You should not have major dental surgery while on this drug. See your dentist to have a dental exam and fix any dental problems before starting this drug. Take good care of your teeth while on this drug. Make sure you see your dentist for regular follow-up appointments. You should make sure you get enough calcium and vitamin D while you are taking this drug. Discuss the foods you eat and the vitamins you take with your health care provider. You may need blood work done while you are taking this drug. Do not become pregnant while taking this drug. Women should inform their health care provider if they wish to become pregnant or think they might be pregnant. There is potential for serious harm to an unborn child. Talk to your health care provider for more information. What side effects may I notice from receiving this medication? Side effects that you should report to your doctor or health care  provider as soon as possible: allergic reactions (skin rash, itching or hives; swelling of the face, lips, or tongue) bleeding (bloody or black, tarry stools; red or dark brown urine; spitting up blood or brown material that looks like coffee grounds; red spots on the skin; unusual bruising or bleeding from the eyes, gums, or nose) bone  pain increased thirst infection (fever, chills, cough, sore throat, pain or trouble passing urine) jaw pain, especially after dental work joint pain kidney injury (trouble passing urine or change in the amount of urine) low calcium levels (fast heartbeat; muscle cramps or pain; pain, tingling, or numbness in the hands or feet; seizures) low magnesium levels (fast, irregular heartbeat; muscle cramp or pain; muscle weakness; tremors; seizures) low potassium levels (trouble breathing; chest pain; dizziness; fast, irregular heartbeat; feeling faint or lightheaded, falls; muscle cramps or pain) muscle pain pain, redness, or irritation at site where injected redness, blistering, peeling, or loosening of the skin, including inside the mouth severe diarrhea unusual sweating Side effects that usually do not require medical attention (report to your doctor or health care provider if they continue or are bothersome): constipation eye irritation, itching, or pain fever headache increase in blood pressure loss of appetite nausea stomach pain unusually weak or tired vomiting This list may not describe all possible side effects. Call your doctor for medical advice about side effects. You may report side effects to FDA at 1-800-FDA-1088. Where should I keep my medication? This drug is given in a hospital or clinic. It will not be stored at home. NOTE: This sheet is a summary. It may not cover all possible information. If you have questions about this medicine, talk to your doctor, pharmacist, or health care provider.  2022 Elsevier/Gold Standard (2019-11-07 12:19:52)

## 2021-09-30 ENCOUNTER — Other Ambulatory Visit: Payer: Self-pay | Admitting: Hematology

## 2021-09-30 ENCOUNTER — Encounter: Payer: Self-pay | Admitting: Hematology

## 2021-09-30 DIAGNOSIS — H409 Unspecified glaucoma: Secondary | ICD-10-CM | POA: Diagnosis not present

## 2021-09-30 DIAGNOSIS — C7951 Secondary malignant neoplasm of bone: Secondary | ICD-10-CM | POA: Diagnosis not present

## 2021-09-30 DIAGNOSIS — K219 Gastro-esophageal reflux disease without esophagitis: Secondary | ICD-10-CM | POA: Diagnosis not present

## 2021-09-30 DIAGNOSIS — C61 Malignant neoplasm of prostate: Secondary | ICD-10-CM | POA: Diagnosis not present

## 2021-09-30 DIAGNOSIS — G8929 Other chronic pain: Secondary | ICD-10-CM | POA: Diagnosis not present

## 2021-09-30 DIAGNOSIS — C8590 Non-Hodgkin lymphoma, unspecified, unspecified site: Secondary | ICD-10-CM | POA: Diagnosis not present

## 2021-09-30 DIAGNOSIS — G4700 Insomnia, unspecified: Secondary | ICD-10-CM | POA: Diagnosis not present

## 2021-09-30 DIAGNOSIS — I509 Heart failure, unspecified: Secondary | ICD-10-CM | POA: Diagnosis not present

## 2021-09-30 DIAGNOSIS — M1990 Unspecified osteoarthritis, unspecified site: Secondary | ICD-10-CM | POA: Diagnosis not present

## 2021-10-02 ENCOUNTER — Encounter: Payer: Self-pay | Admitting: Hematology

## 2021-10-02 MED ORDER — HYDROCODONE-ACETAMINOPHEN 5-325 MG PO TABS: 1.0000 | ORAL_TABLET | Freq: Four times a day (QID) | ORAL | 0 refills | Status: DC | PRN

## 2021-10-09 ENCOUNTER — Other Ambulatory Visit: Payer: Self-pay

## 2021-10-09 DIAGNOSIS — C61 Malignant neoplasm of prostate: Secondary | ICD-10-CM

## 2021-10-10 ENCOUNTER — Other Ambulatory Visit: Payer: Self-pay

## 2021-10-10 ENCOUNTER — Ambulatory Visit: Payer: Medicare PPO

## 2021-10-10 ENCOUNTER — Inpatient Hospital Stay: Payer: Medicare PPO | Attending: Hematology | Admitting: Hematology

## 2021-10-10 ENCOUNTER — Inpatient Hospital Stay: Payer: Medicare PPO

## 2021-10-10 VITALS — BP 124/75 | HR 87 | Temp 96.4°F | Resp 17 | Ht 70.0 in | Wt 164.7 lb

## 2021-10-10 DIAGNOSIS — C61 Malignant neoplasm of prostate: Secondary | ICD-10-CM | POA: Diagnosis not present

## 2021-10-10 DIAGNOSIS — M25552 Pain in left hip: Secondary | ICD-10-CM | POA: Diagnosis not present

## 2021-10-10 DIAGNOSIS — D539 Nutritional anemia, unspecified: Secondary | ICD-10-CM | POA: Diagnosis not present

## 2021-10-10 DIAGNOSIS — Z8572 Personal history of non-Hodgkin lymphomas: Secondary | ICD-10-CM | POA: Diagnosis not present

## 2021-10-10 DIAGNOSIS — I10 Essential (primary) hypertension: Secondary | ICD-10-CM | POA: Diagnosis not present

## 2021-10-10 DIAGNOSIS — C7951 Secondary malignant neoplasm of bone: Secondary | ICD-10-CM

## 2021-10-10 LAB — CBC WITH DIFFERENTIAL (CANCER CENTER ONLY)
Abs Immature Granulocytes: 0.03 10*3/uL (ref 0.00–0.07)
Basophils Absolute: 0 10*3/uL (ref 0.0–0.1)
Basophils Relative: 1 %
Eosinophils Absolute: 0 10*3/uL (ref 0.0–0.5)
Eosinophils Relative: 1 %
HCT: 28.8 % — ABNORMAL LOW (ref 39.0–52.0)
Hemoglobin: 9.4 g/dL — ABNORMAL LOW (ref 13.0–17.0)
Immature Granulocytes: 1 %
Lymphocytes Relative: 10 %
Lymphs Abs: 0.5 10*3/uL — ABNORMAL LOW (ref 0.7–4.0)
MCH: 31.1 pg (ref 26.0–34.0)
MCHC: 32.6 g/dL (ref 30.0–36.0)
MCV: 95.4 fL (ref 80.0–100.0)
Monocytes Absolute: 0.5 10*3/uL (ref 0.1–1.0)
Monocytes Relative: 9 %
Neutro Abs: 4.1 10*3/uL (ref 1.7–7.7)
Neutrophils Relative %: 78 %
Platelet Count: 155 10*3/uL (ref 150–400)
RBC: 3.02 MIL/uL — ABNORMAL LOW (ref 4.22–5.81)
RDW: 14.1 % (ref 11.5–15.5)
WBC Count: 5.2 10*3/uL (ref 4.0–10.5)
nRBC: 0 % (ref 0.0–0.2)

## 2021-10-10 LAB — CMP (CANCER CENTER ONLY)
ALT: 10 U/L (ref 0–44)
AST: 21 U/L (ref 15–41)
Albumin: 3.7 g/dL (ref 3.5–5.0)
Alkaline Phosphatase: 281 U/L — ABNORMAL HIGH (ref 38–126)
Anion gap: 9 (ref 5–15)
BUN: 30 mg/dL — ABNORMAL HIGH (ref 8–23)
CO2: 28 mmol/L (ref 22–32)
Calcium: 9.4 mg/dL (ref 8.9–10.3)
Chloride: 102 mmol/L (ref 98–111)
Creatinine: 1.02 mg/dL (ref 0.61–1.24)
GFR, Estimated: >60 mL/min (ref >60)
Glucose, Bld: 113 mg/dL — ABNORMAL HIGH (ref 70–99)
Potassium: 4.2 mmol/L (ref 3.5–5.1)
Sodium: 139 mmol/L (ref 135–145)
Total Bilirubin: 0.9 mg/dL (ref 0.3–1.2)
Total Protein: 6.4 g/dL — ABNORMAL LOW (ref 6.5–8.1)

## 2021-10-10 MED ORDER — SODIUM CHLORIDE 0.9% FLUSH: 3.0000 mL | Freq: Once | INTRAVENOUS | Status: DC | PRN

## 2021-10-10 MED ORDER — HEPARIN SOD (PORK) LOCK FLUSH 100 UNIT/ML IV SOLN: 500.0000 [IU] | Freq: Once | INTRAVENOUS | Status: DC | PRN

## 2021-10-10 MED ORDER — HEPARIN SOD (PORK) LOCK FLUSH 100 UNIT/ML IV SOLN: 250.0000 [IU] | Freq: Once | INTRAVENOUS | Status: DC | PRN

## 2021-10-10 MED ORDER — SODIUM CHLORIDE 0.9% FLUSH: 10.0000 mL | Freq: Once | INTRAVENOUS | Status: DC | PRN

## 2021-10-10 MED ORDER — ALTEPLASE 2 MG IJ SOLR: 2.0000 mg | Freq: Once | INTRAMUSCULAR | Status: DC | PRN

## 2021-10-10 MED ORDER — LEUPROLIDE ACETATE (3 MONTH) 22.5 MG ~~LOC~~ KIT: 22.5000 mg | PACK | Freq: Once | SUBCUTANEOUS | Status: DC

## 2021-10-10 NOTE — Addendum Note (Signed)
Addended by: Henreitta Leber E on: 10/10/2021 12:17 PM   Modules accepted: Orders

## 2021-10-11 ENCOUNTER — Telehealth: Payer: Self-pay | Admitting: Hematology

## 2021-10-11 DIAGNOSIS — Z23 Encounter for immunization: Secondary | ICD-10-CM | POA: Diagnosis not present

## 2021-10-11 LAB — PSA, TOTAL AND FREE
PSA, Free Pct: >2.1 %
PSA, Free: >50 ng/mL
Prostate Specific Ag, Serum: 2410 ng/mL — ABNORMAL HIGH (ref 0.0–4.0)

## 2021-10-11 NOTE — Telephone Encounter (Signed)
Left message with follow-up appointments per 10/13 los. 

## 2021-10-12 ENCOUNTER — Encounter: Payer: Self-pay | Admitting: Hematology

## 2021-10-16 ENCOUNTER — Other Ambulatory Visit: Payer: Self-pay

## 2021-10-16 DIAGNOSIS — C61 Malignant neoplasm of prostate: Secondary | ICD-10-CM

## 2021-10-17 ENCOUNTER — Encounter: Payer: Self-pay | Admitting: Hematology

## 2021-10-17 ENCOUNTER — Inpatient Hospital Stay: Payer: Medicare PPO

## 2021-10-17 ENCOUNTER — Telehealth: Payer: Self-pay | Admitting: Pharmacist

## 2021-10-17 ENCOUNTER — Other Ambulatory Visit (HOSPITAL_COMMUNITY): Payer: Self-pay

## 2021-10-17 ENCOUNTER — Telehealth: Payer: Self-pay

## 2021-10-17 ENCOUNTER — Other Ambulatory Visit: Payer: Self-pay

## 2021-10-17 VITALS — BP 140/42 | HR 51 | Temp 98.0°F | Resp 18 | Ht 70.0 in | Wt 163.5 lb

## 2021-10-17 DIAGNOSIS — C61 Malignant neoplasm of prostate: Secondary | ICD-10-CM

## 2021-10-17 DIAGNOSIS — M25552 Pain in left hip: Secondary | ICD-10-CM | POA: Diagnosis not present

## 2021-10-17 DIAGNOSIS — C7951 Secondary malignant neoplasm of bone: Secondary | ICD-10-CM | POA: Diagnosis not present

## 2021-10-17 DIAGNOSIS — I10 Essential (primary) hypertension: Secondary | ICD-10-CM | POA: Diagnosis not present

## 2021-10-17 DIAGNOSIS — Z8572 Personal history of non-Hodgkin lymphomas: Secondary | ICD-10-CM | POA: Diagnosis not present

## 2021-10-17 DIAGNOSIS — D539 Nutritional anemia, unspecified: Secondary | ICD-10-CM | POA: Diagnosis not present

## 2021-10-17 LAB — CBC WITH DIFFERENTIAL (CANCER CENTER ONLY)
Abs Immature Granulocytes: 0.05 10*3/uL (ref 0.00–0.07)
Basophils Absolute: 0 10*3/uL (ref 0.0–0.1)
Basophils Relative: 0 %
Eosinophils Absolute: 0.1 10*3/uL (ref 0.0–0.5)
Eosinophils Relative: 1 %
HCT: 28.1 % — ABNORMAL LOW (ref 39.0–52.0)
Hemoglobin: 9.3 g/dL — ABNORMAL LOW (ref 13.0–17.0)
Immature Granulocytes: 1 %
Lymphocytes Relative: 12 %
Lymphs Abs: 0.7 10*3/uL (ref 0.7–4.0)
MCH: 31.2 pg (ref 26.0–34.0)
MCHC: 33.1 g/dL (ref 30.0–36.0)
MCV: 94.3 fL (ref 80.0–100.0)
Monocytes Absolute: 0.5 10*3/uL (ref 0.1–1.0)
Monocytes Relative: 8 %
Neutro Abs: 4.4 10*3/uL (ref 1.7–7.7)
Neutrophils Relative %: 78 %
Platelet Count: 173 10*3/uL (ref 150–400)
RBC: 2.98 MIL/uL — ABNORMAL LOW (ref 4.22–5.81)
RDW: 13.8 % (ref 11.5–15.5)
WBC Count: 5.6 10*3/uL (ref 4.0–10.5)
nRBC: 0 % (ref 0.0–0.2)

## 2021-10-17 LAB — CMP (CANCER CENTER ONLY)
ALT: 5 U/L (ref 0–44)
AST: 13 U/L — ABNORMAL LOW (ref 15–41)
Albumin: 3.6 g/dL (ref 3.5–5.0)
Alkaline Phosphatase: 328 U/L — ABNORMAL HIGH (ref 38–126)
Anion gap: 12 (ref 5–15)
BUN: 23 mg/dL (ref 8–23)
CO2: 27 mmol/L (ref 22–32)
Calcium: 9.5 mg/dL (ref 8.9–10.3)
Chloride: 99 mmol/L (ref 98–111)
Creatinine: 1.23 mg/dL (ref 0.61–1.24)
GFR, Estimated: 54 mL/min — ABNORMAL LOW (ref >60)
Glucose, Bld: 107 mg/dL — ABNORMAL HIGH (ref 70–99)
Potassium: 4.2 mmol/L (ref 3.5–5.1)
Sodium: 138 mmol/L (ref 135–145)
Total Bilirubin: 0.3 mg/dL (ref 0.3–1.2)
Total Protein: 6.3 g/dL — ABNORMAL LOW (ref 6.5–8.1)

## 2021-10-17 MED ORDER — SODIUM CHLORIDE 0.9 % IV SOLN: Freq: Once | INTRAVENOUS | Status: AC

## 2021-10-17 MED ORDER — ENZALUTAMIDE 80 MG PO TABS
80.0000 mg | ORAL_TABLET | Freq: Every day | ORAL | 2 refills | Status: DC
  Filled 2021-10-17: qty 60, 60d supply, fill #0

## 2021-10-17 MED ORDER — SODIUM CHLORIDE 0.9 % IV SOLN
60.0000 mg | Freq: Once | INTRAVENOUS | Status: AC
  Administered 2021-10-17: 60 mg via INTRAVENOUS
  Filled 2021-10-17: qty 10

## 2021-10-17 MED ORDER — ENZALUTAMIDE 80 MG PO TABS
80.0000 mg | ORAL_TABLET | Freq: Every day | ORAL | 2 refills | Status: DC
  Filled 2021-10-17: qty 60, 60d supply, fill #0
  Filled 2021-10-18: qty 30, 30d supply, fill #0

## 2021-10-17 NOTE — Telephone Encounter (Signed)
Oral Oncology Pharmacist Encounter  Received new prescription for Xtandi (enzalutamide) for the treatment of metastatic prostate cancer in conjunction with ADT, planned duration until disease progression or unacceptable drug toxicity.  CBC w/ Diff and CMP from 10/17/21 assessed, noted alk phos elevated at 328 U/L (likely 2/2 bone mets). Scr 1.23 mg/dL - no renal dose adjustments required. All other labs OK for treatment initiation. Prescription dose and frequency assessed - per Dr. Irene Limbo plan is for patient to start on lower dose of enzalutamide 80 mg PO once daily and then increase to 160 mg once daily if tolerated.   Current medication list in Epic reviewed, DDIs with Xtandi identified: Category D DDI between [place] and Trazodone - Xtandi, a strong CYP3A4 inducer may decrease serum concentrations of trazodone. Recommend monitoring for decreased efficacy of trazodone and dose adjusting as appropriate. Category C DDI between Xtandi and Hydrocodone-Acetaminophen - Xtandi, a strong CYP3A4 inducer may decrease serum concentrations of hydrocodone, resulting in reduced efficacy. Recommend monitoring patient's pain levels and adjusting hydrocodone/APAP as needed.   Evaluated chart and no patient barriers to medication adherence noted.   Prescription has been e-scribed to the [facility] for benefits analysis and approval.  Oral Oncology Clinic will continue to follow for insurance authorization, copayment issues, initial counseling and start date.  Leron Croak, PharmD, BCPS Hematology/Oncology Clinical Pharmacist [facility] [place] [phone] 10/17/2021 1:51 PM

## 2021-10-17 NOTE — Telephone Encounter (Signed)
Oral Oncology Patient Advocate Encounter   Received notification from [facility] that prior authorization for Gillermina Phy is required.   PA submitted on CoverMyMeds [place] Status is pending   Oral Oncology Clinic will continue to follow.   [place] Patient [place] Phone [phone] Fax [phone] 10/17/2021 1:57 PM

## 2021-10-17 NOTE — Telephone Encounter (Signed)
Oral Oncology Patient Advocate Encounter  Prior Authorization for Mcarthur Rossetti has been approved.    PA# [id number] Effective dates: 10/17/21 through 04/15/22  Patients co-pay is $100  Oral Oncology Clinic will continue to follow.    [facility] Patient [place] Phone [phone] Fax [phone] 10/17/2021 2:01 PM

## 2021-10-17 NOTE — Progress Notes (Addendum)
HEMATOLOGY/ONCOLOGY CLINIC NOTE  Date of Service: .10/10/2021   Patient Care Team: Jacob Small, MD (Inactive) as PCP - General (Family Medicine) Jacob Spark, MD (Inactive) as PCP - Cardiology (Cardiology) Jacob Lance, MD as PCP - Electrophysiology (Cardiology)  CHIEF COMPLAINTS/PURPOSE OF CONSULTATION:  F/u for recently diagnosed prostate cancer with bone metastases Hx of Follicular Lymphoma  HISTORY OF PRESENTING ILLNESS:   Jacob Warner is a wonderful 87 y.o. male who has been referred to Jacob Warner by Dr. Maurice Warner for evaluation and management of abnormal CBC/history of follicular lymphoma. Pt is accompanied today by his son. The pt reports that he is doing well overall.   The pt reports that he was diagnosed with and treated for Follicular Lymphoma in 9678. He completed 5 months of Ritxuan, Cytoxan, and Prednisone. He has since been in remission.   A few months ago pt began having pain in his right-sided lower back. He found relief from this discomfort after he increased his water intake. The pain as since returned and remains despite the pt continuing to drink an adequate amount of water. Pt was referred to a Nephrologist after discussing his back pain with Dr. Justin Warner, his PCP. Pt has previously had kidney stones and has been told that he has BPH, but this has not disturbed his urinary habits. He denies seeing any blood in his urine, but was found to have hematuria on recent labs with Dr. Justin Warner. Pt had an abdominal Jacob Warner in September, which showed a benign left kidney cyst. Pt has chronic back pain for which he receives steroid injections.    He has no history of thyroid disorders. Pt takes a baby Aspirin for his Afib and Pantoprazole daily. Pt has a history of dysphagia and has required esophageal dilation. He has lost nearly 40 lbs in the last six months. Pt has been eating less the last few months due to a decreased appetite and a change in routine. He also reports smaller  stools in the last 6-8 months. His stools used to be round, but are now much thinner. This is completely new and persistent.   Of note since the patient's last visit, pt has had Jacob Warner Abd ([id number]) completed on 09/04/2020 with results revealing "Stable 2.8 x 3.5 x 2.3 cm left superior pole simple renal cyst. Otherwise unremarkable upper abdominal ultrasound with the pancreas not visualized."  Most recent lab results (08/21/2020) of CBC w/diff and CMP is as follows: all values are WNL except for RBC at 3.05, Hgb at 9.9, HCT at 29.3, MCV at 96.0, Neutro Rel at 73.8, Lymphs Rel at 10.3, Lymphs Abs at 0.90K, Glucose at 103, BUN at 32, Creatinine at 1.46, GFR Est Non Af Am at 45, ALP at 140.  On review of systems, pt reports dysphagia, weight loss, low appetite, back pain, change in appearance of stools, neuropathy and denies nausea, abdominal pain, hematuria, fevers, chills, night sweats, new lumps/bumps, bloody stools, abdominal pain, testicular pain/swelling and any other symptoms.   On PMHx the pt reports Follicular Lymphoma, Cervical Spine Surgery, Spinal Cord Stimulator Implant, Afib, Pelvic fracture, Reflux, BPH, Kidney Stones.   INTERVAL HISTORY  Jacob Warner is a wonderful 87 y.o. male who is here today for evaluation and management of abnormal CBC/history of follicular lymphoma and recently diagnosed metastatic prostate cancer. The patient's last visit with Jacob Warner was on 07/18/2021. We are joined today by his son.  The pt reports that he has had some increased fatigue.  He  also notes some low back pain. He notes some mild hot flashes grade 1.  Grade 1 through 2 fatigue.  He is taking his hydrocodone as needed usually once or twice a day. His PSA levels have significantly increased while on Lupron and bisphosphonates this is concerning for progression and was discussed with the patient and his son.  We shall be getting a PSMA PET CT scan for reevaluation and we talked about consideration of  adding Xtandi to his treatment. CBC shows continued anemia hemoglobin 9.4 with normal WBC count and platelets. CMP stable creatinine a little better at 1.02.  MEDICAL HISTORY:  Past Medical History:  Diagnosis Date   A-fib ([place])    Alopecia    Anemia    Bradycardia    Cancer ([place])    Cherry angioma    Echocardiogram    Echo 11/19: Mild concentric LVH, EF 50-55, normal wall motion, grade 1 diastolic dysfunction, mild AI, MAC, mild MR, normal RVSF, moderate TR, PASP 36   GERD (gastroesophageal reflux disease)    Glaucoma    Hypertension    Nuclear stress test    Nuclear stress test 11/19: not gated, inf and apical defect c/w Warner scar and/or soft tissue attenuation   Pelvic fracture ([facility])    Right BBB/left ant fasc block    Sick sinus syndrome ([place])    TIA (transient ischemic attack)    Xeroderma     SURGICAL HISTORY: Past Surgical History:  Procedure Laterality Date   APPENDECTOMY     CERVICAL SPINE SURGERY     EXPLORATORY LAPAROTOMY     LITHOTRIPSY     PACEMAKER PLACEMENT     PPM GENERATOR CHANGEOUT N/A 05/10/2020   Procedure: PPM GENERATOR CHANGEOUT;  Surgeon: Jacob Lance, MD;  Location: [facility];  Service: Cardiovascular;  Laterality: N/A;   SPINAL CORD STIMULATOR IMPLANT Bilateral 2018   Jacob Hakim, MD PhD, [facility]    SOCIAL HISTORY: Social History   Socioeconomic History   Marital status: Married    Spouse name: Not on file   Number of children: Not on file   Years of education: Not on file   Highest education level: Not on file  Occupational History   Not on file  Tobacco Use   Smoking status: Never   Smokeless tobacco: Never  Vaping Use   Vaping Use: Never used  Substance and Sexual Activity   Alcohol use: No    Alcohol/week: 0.0 standard drinks   Drug use: No   Sexual activity: Not on file  Other Topics Concern   Not on file  Social History Narrative   Not on file   Social Determinants of  Health   Financial Resource Strain: Not on file  Food Insecurity: Not on file  Transportation Needs: Not on file  Physical Activity: Not on file  Stress: Not on file  Social Connections: Not on file  Intimate Partner Violence: Not on file    FAMILY HISTORY: Family History  Problem Relation Age of Onset   Brain cancer Sister     ALLERGIES:  is allergic to sulfamethoxazole-trimethoprim, sulfa antibiotics, and sulfacetamide sodium.  MEDICATIONS:  Current Outpatient Medications  Medication Sig Dispense Refill   acetaminophen (TYLENOL) 500 MG tablet Take 500-1,000 mg by mouth See admin instructions. 500 mg twice daily, 1000 mg at bedtime     calcium carbonate (TUMS - DOSED IN MG ELEMENTAL CALCIUM) 500 MG chewable tablet 1 tablet  dutasteride (AVODART) 0.5 MG capsule Take 0.5 mg by mouth daily.     furosemide (LASIX) 40 MG tablet Take 40 mg by mouth daily.     gabapentin (NEURONTIN) 300 MG capsule Take 1 capsule (300 mg total) by mouth at bedtime. 90 capsule 1   HYDROcodone-acetaminophen (NORCO/VICODIN) 5-325 MG tablet Take 1 tablet by mouth every 6 (six) hours as needed for moderate pain or severe pain. 60 tablet 0   hydroxypropyl methylcellulose / hypromellose (ISOPTO TEARS / GONIOVISC) 2.5 % ophthalmic solution Place 1 drop into both eyes as needed for dry eyes.     ibuprofen (ADVIL) 200 MG tablet 1 tablet with food or milk as needed     ketoconazole (NIZORAL) 2 % shampoo Apply 1 application topically 2 (two) times a week.      magnesium oxide (MAG-OX) 400 MG tablet 1 tablet     nitroGLYCERIN (NITROSTAT) 0.4 MG SL tablet Place 1 tablet (0.4 mg total) under the tongue every 5 (five) minutes as needed for chest pain. 25 tablet 5   RESTASIS 0.05 % ophthalmic emulsion Place 1 drop into both eyes daily.   11   ROCKLATAN 0.02-0.005 % SOLN      traZODone (DESYREL) 50 MG tablet Take 50 mg by mouth at bedtime.   11   vitamin B-12 (CYANOCOBALAMIN) 1000 MCG tablet Take 1,000 mcg by mouth  daily.     White Petrolatum-Mineral Oil (EYE LUBRICANT) OINT Apply 1 strip to eye at bedtime.     azelastine (ASTELIN) 0.1 % nasal spray Place 1 spray into both nostrils 2 (two) times daily as needed for rhinitis.  (Patient not taking: Reported on 10/10/2021)     cephALEXin (KEFLEX) 500 MG capsule  (Patient not taking: Reported on 10/10/2021)     denosumab (PROLIA) 60 MG/ML SOSY injection Inject 60 mg into the skin every 6 (six) months. (Patient not taking: Reported on 10/10/2021)     lactulose (CHRONULAC) 10 GM/15ML solution Take 10 g by mouth daily. (Patient not taking: Reported on 10/10/2021)  0   latanoprost (XALATAN) 0.005 % ophthalmic solution Place 1 drop into both eyes at bedtime.  (Patient not taking: Reported on 10/10/2021)  11   Magnesium Oxide 400 (240 Mg) MG TABS Take 1 tablet by mouth 2 (two) times daily. (Patient not taking: Reported on 10/10/2021)  7   meloxicam (MOBIC) 15 MG tablet Take 15 mg by mouth daily. (Patient not taking: Reported on 10/10/2021)     montelukast (SINGULAIR) 10 MG tablet 1 tablet (Patient not taking: Reported on 10/10/2021)     pantoprazole (PROTONIX) 40 MG tablet Take 1 tablet by mouth daily. (Patient not taking: Reported on 10/10/2021)     topiramate (TOPAMAX) 25 MG tablet  (Patient not taking: Reported on 10/10/2021)     No current facility-administered medications for this visit.   Facility-Administered Medications Ordered in Other Visits  Medication Dose Route Frequency Provider Last Rate Last Admin   pamidronate (AREDIA) 60 mg in sodium chloride 0.9 % 500 mL IVPB  60 mg Intravenous Once Brunetta Genera, MD 255 mL/hr at 10/17/21 1135 60 mg at 10/17/21 1135    REVIEW OF SYSTEMS:   .10 Point review of Systems was done is negative except as noted above.   PHYSICAL EXAMINATION: ECOG PERFORMANCE STATUS: 2 .BP 124/75 (BP Location: Left Arm, Patient Position: Sitting)   Pulse 87   Temp (!) 96.4 F (35.8 C) (Oral)   Resp 17   Ht _0  (1.778 m)  Wt 164 lb 11.2 oz (74.7 kg)   SpO2 100%   BMI 23.63 kg/m   Exam was given in a chair.   GENERAL:alert, in no acute distress and comfortable SKIN: no acute rashes, no significant lesions EYES: conjunctiva are pink and non-injected, sclera anicteric OROPHARYNX: MMM, no exudates, no oropharyngeal erythema or ulceration NECK: supple, no JVD LYMPH:  no palpable lymphadenopathy in the cervical, axillary or inguinal regions LUNGS: clear to auscultation b/l with normal respiratory effort HEART: regular rate & rhythm ABDOMEN:  normoactive bowel sounds , non tender, not distended. Extremity: no pedal edema PSYCH: alert & oriented x 3 with fluent speech NEURO: no focal motor/sensory deficits  LABORATORY DATA:  I have reviewed the data as listed  . CBC Latest Ref Rng & Units 10/17/2021 10/10/2021 09/19/2021  WBC 4.0 - 10.5 K/uL 5.6 5.2 5.8  Hemoglobin 13.0 - 17.0 g/dL 9.3(L) 9.4(L) 8.9(L)  Hematocrit 39.0 - 52.0 % 28.1(L) 28.8(L) 27.7(L)  Platelets 150 - 400 K/uL 173 155 165   . CBC    Component Value Date/Time   WBC 5.6 10/17/2021 1008   WBC 4.9 05/24/2021 1123   RBC 2.98 (L) 10/17/2021 1008   HGB 9.3 (L) 10/17/2021 1008   HGB 11.4 (L) 04/20/2020 1106   HGB 12.2 (L) 07/08/2017 1336   HCT 28.1 (L) 10/17/2021 1008   HCT 34.1 (L) 09/17/2020 1205   HCT 35.6 (L) 07/08/2017 1336   PLT 173 10/17/2021 1008   PLT 221 04/20/2020 1106   MCV 94.3 10/17/2021 1008   MCV 97 04/20/2020 1106   MCV 96.2 07/08/2017 1336   MCH 31.2 10/17/2021 1008   MCHC 33.1 10/17/2021 1008   RDW 13.8 10/17/2021 1008   RDW 13.2 04/20/2020 1106   RDW 13.9 07/08/2017 1336   LYMPHSABS 0.7 10/17/2021 1008   LYMPHSABS 1.3 04/20/2020 1106   LYMPHSABS 0.8 (L) 07/08/2017 1336   MONOABS 0.5 10/17/2021 1008   MONOABS 0.5 07/08/2017 1336   EOSABS 0.1 10/17/2021 1008   EOSABS 0.2 04/20/2020 1106   BASOSABS 0.0 10/17/2021 1008   BASOSABS 0.0 04/20/2020 1106   BASOSABS 0.0 07/08/2017 1336    . CMP Latest Ref  Rng & Units 10/17/2021 10/10/2021 09/19/2021  Glucose 70 - 99 mg/dL 107(H) 113(H) 96  BUN 8 - 23 mg/dL 23 30(H) 33(H)  Creatinine 0.61 - 1.24 mg/dL 1.23 1.02 1.39(H)  Sodium 135 - 145 mmol/L 138 139 139  Potassium 3.5 - 5.1 mmol/L 4.2 4.2 4.7  Chloride 98 - 111 mmol/L 99 102 102  CO2 22 - 32 mmol/L _0 Calcium 8.9 - 10.3 mg/dL 9.5 9.4 9.4  Total Protein 6.5 - 8.1 g/dL 6.3(L) 6.4(L) 6.1(L)  Total Bilirubin 0.3 - 1.2 mg/dL 0.3 0.9 0.4  Alkaline Phos 38 - 126 U/L 328(H) 281(H) 290(H)  AST 15 - 41 U/L 13(L) 21 25  ALT 0 - 44 U/L _1 RADIOGRAPHIC STUDIES: I have personally reviewed the radiological images as listed and agreed with the findings in the report. No results found.  ASSESSMENT & PLAN:   87 yo with   1) Metastatic prostate cancer with bone metastases and especially left hip pain.  Concern for progression on Lupron and bisphosphonates.  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 2) Bone metastases 3) h/o follicular lymphoma in 3833 rx with R-CVP 4) Mild macrocytic anemia - likely from metastatic prostate cancer.   PLAN: -Discussed pt labwork today, 10/10/2021; counts and chemistries stable,  PSA down to 278  from 1261 now is progressively increasing again and is up to 2410. -Patient is tolerating the Lupron and bisphosphonates without significant toxicities at this time. -He is having some back pain and with increasing PSA level we shall get a PSMA PET CT scan to evaluate for disease progression.  No new bladder or bowel symptoms.  No new lower extremity weakness. -We discussed that we shall be continuing his Lupron and bisphosphonates but would recommend adding Xtandi to optimize his treatment considering his metastatic prostate cancer is likely castrate resistant at this time. -If any focal lesions that are significant on imaging role for palliative radiation -Recommended pt continue to be cautious with bending and twisting movements. --Continue Lupron q79month at this  time. -Continue Aredia qmonthly to reduce risks of fractures or bone hardening. -We will start Xtandi @ 863mpo daily and advance to 16055mo daily if tolerated -Recommended pt take 2000 IU Vitamin D daily. Advised pt he can take up to 5000 IU daily. -Advised pt he can use OTC Voltaren gel for application over painful area in back. Recommended OTC 4% Lidocaine patch for local pain control. -He does have hydrocodone/Tylenol to use as needed for pain. -Will see back in 3 months with labs.   FOLLOW UP: PSMA PET/CT ASAP Phone visit with Dr KalIrene Limbo 2 weeks Please schedule monthly labs and pamidronate infusions x 4 Lupron injections every 3 months please schedule x4    . The total time spent in the appointment was 30 minutes and more than 50% was on counseling and direct patient cares.  All of the patient's questions were answered with apparent satisfaction. The patient knows to call the clinic with any problems, questions or concerns.  GauSullivan Lone MS [place] ParkHIVMS SCHBanner Ironwood Medical CenterHEndoscopy Center Of Little RockLLCmatology/Oncology Physician ConSierra Tucson, [facility]

## 2021-10-17 NOTE — Addendum Note (Signed)
Addended by: Sullivan Lone on: 10/17/2021 01:17 PM   Modules accepted: Orders

## 2021-10-17 NOTE — Patient Instructions (Signed)
Pamidronate Injection What is this medication? PAMIDRONATE (pa mi DROE nate) slows calcium loss from bones. It treats Paget's disease and high calcium levels in the blood from some kinds of cancer. It may be used in other people at risk for bone loss. This medicine may be used for other purposes; ask your health care provider or pharmacist if you have questions. COMMON BRAND NAME(S): Aredia What should I tell my care team before I take this medication? They need to know if you have any of these conditions: bleeding disorder cancer dental disease kidney disease low levels of calcium or other minerals in the blood low red blood cell counts receiving steroids like dexamethasone or prednisone an unusual or allergic reaction to pamidronate, other drugs, foods, dyes or preservatives pregnant or trying to get pregnant breast-feeding How should I use this medication? This drug is injected into a vein. It is given by a health care provider in a hospital or clinic setting. Talk to your health care provider about the use of this drug in children. Special care may be needed. Overdosage: If you think you have taken too much of this medicine contact a poison control center or emergency room at once. NOTE: This medicine is only for you. Do not share this medicine with others. What if I miss a dose? Keep appointments for follow-up doses. It is important not to miss your dose. Call your health care provider if you are unable to keep an appointment. What may interact with this medication? certain antibiotics given by injection medicines for inflammation or pain like ibuprofen, naproxen some diuretics like bumetanide, furosemide cyclosporine parathyroid hormone tacrolimus teriparatide thalidomide This list may not describe all possible interactions. Give your health care provider a list of all the medicines, herbs, non-prescription drugs, or dietary supplements you use. Also tell them if you smoke,  drink alcohol, or use illegal drugs. Some items may interact with your medicine. What should I watch for while using this medication? Visit your health care provider for regular checks on your progress. It may be some time before you see the benefit from this drug. Some people who take this drug have severe bone, joint, or muscle pain. This drug may also increase your risk for jaw problems or a broken thigh bone. Tell your health care provider right away if you have severe pain in your jaw, bones, joints, or muscles. Tell you health care provider if you have any pain that does not go away or that gets worse. Tell your dentist and dental surgeon that you are taking this drug. You should not have major dental surgery while on this drug. See your dentist to have a dental exam and fix any dental problems before starting this drug. Take good care of your teeth while on this drug. Make sure you see your dentist for regular follow-up appointments. You should make sure you get enough calcium and vitamin D while you are taking this drug. Discuss the foods you eat and the vitamins you take with your health care provider. You may need blood work done while you are taking this drug. Do not become pregnant while taking this drug. Women should inform their health care provider if they wish to become pregnant or think they might be pregnant. There is potential for serious harm to an unborn child. Talk to your health care provider for more information. What side effects may I notice from receiving this medication? Side effects that you should report to your doctor or health care  provider as soon as possible: allergic reactions (skin rash, itching or hives; swelling of the face, lips, or tongue) bleeding (bloody or black, tarry stools; red or dark brown urine; spitting up blood or brown material that looks like coffee grounds; red spots on the skin; unusual bruising or bleeding from the eyes, gums, or nose) bone  pain increased thirst infection (fever, chills, cough, sore throat, pain or trouble passing urine) jaw pain, especially after dental work joint pain kidney injury (trouble passing urine or change in the amount of urine) low calcium levels (fast heartbeat; muscle cramps or pain; pain, tingling, or numbness in the hands or feet; seizures) low magnesium levels (fast, irregular heartbeat; muscle cramp or pain; muscle weakness; tremors; seizures) low potassium levels (trouble breathing; chest pain; dizziness; fast, irregular heartbeat; feeling faint or lightheaded, falls; muscle cramps or pain) muscle pain pain, redness, or irritation at site where injected redness, blistering, peeling, or loosening of the skin, including inside the mouth severe diarrhea unusual sweating Side effects that usually do not require medical attention (report to your doctor or health care provider if they continue or are bothersome): constipation eye irritation, itching, or pain fever headache increase in blood pressure loss of appetite nausea stomach pain unusually weak or tired vomiting This list may not describe all possible side effects. Call your doctor for medical advice about side effects. You may report side effects to FDA at 1-800-FDA-1088. Where should I keep my medication? This drug is given in a hospital or clinic. It will not be stored at home. NOTE: This sheet is a summary. It may not cover all possible information. If you have questions about this medicine, talk to your doctor, pharmacist, or health care provider.  2022 Elsevier/Gold Standard (2019-11-07 12:19:52)

## 2021-10-18 ENCOUNTER — Other Ambulatory Visit (HOSPITAL_COMMUNITY): Payer: Self-pay

## 2021-10-18 NOTE — Telephone Encounter (Signed)
Oral Chemotherapy Pharmacist Encounter  I spoke with patient's son, Darly Fails, for overview of: Gillermina Phy for the treatment of metastatic prostate cancer in conjunction with ADT, planned duration until disease progression or unacceptable toxicity.   Counseled on administration, dosing, side effects, monitoring, drug-food interactions, safe handling, storage, and disposal.  Patient will take Xtandi 80mg  tablets, 1 tablet (80mg  total) by mouth once daily without regard to food. Dose may increased based on tolerance in the future.   Patient's son knows that patient will need to avoid grapefruit juice while on Xtandi.   Xtandi start date: 10/23/21  Adverse effects include but are not limited to: hypertension, hot flashes, fatigue, falls/fractures, and arthralgias. Patient instructed about small risk of seizures with Xtandi treatment.  Reviewed with patient importance of keeping a medication schedule and plan for any missed doses. No barriers to medication adherence identified.  Medication reconciliation performed and medication/allergy list updated. Discussed with son that Gillermina Phy may decrease the efficacy of hydrocodone-acetaminophen as well as trazodone and to monitor for reduced pain control and issues with sleep. Patient's son verbalized understanding.   Insurance authorization for Gillermina Phy has been obtained. Test claim at the pharmacy revealed copayment $100 for 1st fill of Xtandi. This will ship from the [facility] on 10/21/21 to deliver to patient's home on 10/22/21.  Patient's son informed the pharmacy will reach out 5-7 days prior to needing next fill of Xtandi to coordinate continued medication acquisition to prevent break in therapy.  All questions answered.  Patient's son voiced understanding and appreciation.   Medication education handout placed in mail for patient and family. Patient and family know to call the office with questions or concerns. Oral  Chemotherapy Clinic phone number provided.   Leron Croak, PharmD, BCPS Hematology/Oncology Clinical Pharmacist [facility] and [place] [phone] 10/18/2021 2:42 PM

## 2021-10-19 ENCOUNTER — Encounter: Payer: Self-pay | Admitting: Hematology

## 2021-10-21 ENCOUNTER — Other Ambulatory Visit (HOSPITAL_COMMUNITY): Payer: Self-pay

## 2021-10-22 ENCOUNTER — Encounter: Payer: Self-pay | Admitting: Hematology

## 2021-10-23 ENCOUNTER — Other Ambulatory Visit: Payer: Self-pay | Admitting: Hematology

## 2021-10-23 DIAGNOSIS — C7951 Secondary malignant neoplasm of bone: Secondary | ICD-10-CM

## 2021-10-24 ENCOUNTER — Inpatient Hospital Stay: Payer: Medicare PPO | Admitting: Hematology

## 2021-10-24 ENCOUNTER — Encounter: Payer: Self-pay | Admitting: Hematology

## 2021-10-24 MED ORDER — HYDROCODONE-ACETAMINOPHEN 5-325 MG PO TABS: 1.0000 | ORAL_TABLET | Freq: Four times a day (QID) | ORAL | 0 refills | Status: DC | PRN

## 2021-10-29 ENCOUNTER — Ambulatory Visit (HOSPITAL_COMMUNITY)
Admission: RE | Admit: 2021-10-29 | Discharge: 2021-10-29 | Disposition: A | Discharge status: Home / Self Care | Payer: Medicare PPO | Source: Ambulatory Visit | Attending: Hematology | Admitting: Hematology

## 2021-10-29 ENCOUNTER — Encounter: Payer: Self-pay | Admitting: Hematology

## 2021-10-29 DIAGNOSIS — C61 Malignant neoplasm of prostate: Secondary | ICD-10-CM | POA: Insufficient documentation

## 2021-10-29 DIAGNOSIS — M549 Dorsalgia, unspecified: Secondary | ICD-10-CM | POA: Diagnosis not present

## 2021-10-29 DIAGNOSIS — Z8546 Personal history of malignant neoplasm of prostate: Secondary | ICD-10-CM | POA: Diagnosis not present

## 2021-10-29 DIAGNOSIS — C7951 Secondary malignant neoplasm of bone: Secondary | ICD-10-CM | POA: Insufficient documentation

## 2021-10-29 DIAGNOSIS — C8590 Non-Hodgkin lymphoma, unspecified, unspecified site: Secondary | ICD-10-CM | POA: Diagnosis not present

## 2021-10-29 MED ORDER — PIFLIFOLASTAT F 18 (PYLARIFY) INJECTION
9.0000 | Freq: Once | INTRAVENOUS | Status: AC
  Administered 2021-10-29: 9.1 via INTRAVENOUS

## 2021-10-30 ENCOUNTER — Inpatient Hospital Stay: Payer: Medicare PPO | Attending: Hematology | Admitting: Hematology

## 2021-10-30 DIAGNOSIS — C7951 Secondary malignant neoplasm of bone: Secondary | ICD-10-CM

## 2021-10-30 DIAGNOSIS — C61 Malignant neoplasm of prostate: Secondary | ICD-10-CM

## 2021-11-01 ENCOUNTER — Encounter: Payer: Self-pay | Admitting: Hematology

## 2021-11-01 ENCOUNTER — Other Ambulatory Visit (HOSPITAL_COMMUNITY): Payer: Self-pay

## 2021-11-01 MED ORDER — ENZALUTAMIDE 80 MG PO TABS
160.0000 mg | ORAL_TABLET | Freq: Every day | ORAL | 2 refills | Status: DC
  Filled 2021-11-01 – 2021-11-09 (×2): qty 60, 30d supply, fill #0
  Filled 2021-11-30 – 2021-12-02 (×2): qty 60, 30d supply, fill #1
  Filled 2021-12-27: qty 60, 30d supply, fill #2

## 2021-11-05 ENCOUNTER — Encounter: Payer: Self-pay | Admitting: Hematology

## 2021-11-05 NOTE — Progress Notes (Signed)
HEMATOLOGY/ONCOLOGY PHONE NOTE  Date of Service: .10/30/2021   Patient Care Team: [name], MD (Inactive) as PCP - General (Family Medicine) [name], MD (Inactive) as PCP - Cardiology (Cardiology) [name], MD as PCP - Electrophysiology (Cardiology)  CHIEF COMPLAINTS/PURPOSE OF CONSULTATION:  F/u for recently diagnosed prostate cancer with bone metastases Hx of Follicular Lymphoma  HISTORY OF PRESENTING ILLNESS:   Jacob Warner is a wonderful 87 y.o. male who has been referred to [place] by Dr. [name] for evaluation and management of abnormal CBC/history of follicular lymphoma. Pt is accompanied today by his son. The pt reports that he is doing well overall.   The pt reports that he was diagnosed with and treated for Follicular Lymphoma in 5400. He completed 5 months of Ritxuan, Cytoxan, and Prednisone. He has since been in remission.   A few months ago pt began having pain in his right-sided lower back. He found relief from this discomfort after he increased his water intake. The pain as since returned and remains despite the pt continuing to drink an adequate amount of water. Pt was referred to a Nephrologist after discussing his back pain with [name], his PCP. Pt has previously had kidney stones and has been told that he has BPH, but this has not disturbed his urinary habits. He denies seeing any blood in his urine, but was found to have hematuria on recent labs with [name]. Pt had an abdominal US in September, which showed a benign left kidney cyst. Pt has chronic back pain for which he receives steroid injections.    He has no history of thyroid disorders. Pt takes a baby Aspirin for his Afib and Pantoprazole daily. Pt has a history of dysphagia and has required esophageal dilation. He has lost nearly 40 lbs in the last six months. Pt has been eating less the last few months due to a decreased appetite and a change in routine. He also reports smaller stools  in the last 6-8 months. His stools used to be round, but are now much thinner. This is completely new and persistent.   Of note since the patient's last visit, pt has had [place] Abd ([id number]) completed on 09/04/2020 with results revealing "Stable 2.8 x 3.5 x 2.3 cm left superior pole simple renal cyst. Otherwise unremarkable upper abdominal ultrasound with the pancreas not visualized."  Most recent lab results (08/21/2020) of CBC w/diff and CMP is as follows: all values are WNL except for RBC at 3.05, Hgb at 9.9, HCT at 29.3, MCV at 96.0, Neutro Rel at 73.8, Lymphs Rel at 10.3, Lymphs Abs at 0.90K, Glucose at 103, BUN at 32, Creatinine at 1.46, GFR Est Non Af Am at 45, ALP at 140.  On review of systems, pt reports dysphagia, weight loss, low appetite, back pain, change in appearance of stools, neuropathy and denies nausea, abdominal pain, hematuria, fevers, chills, night sweats, new lumps/bumps, bloody stools, abdominal pain, testicular pain/swelling and any other symptoms.   On PMHx the pt reports Follicular Lymphoma, Cervical Spine Surgery, Spinal Cord Stimulator Implant, Afib, Pelvic fracture, Reflux, BPH, Kidney Stones.   INTERVAL HISTORY  .I connected with [name] on 11/05/21 at  9:20 AM EDT by telephone visit and verified that I am speaking with the correct person using two identifiers.   I discussed the limitations, risks, security and privacy concerns of performing an evaluation and management service by telemedicine and the availability of in-person appointments. I also discussed  with the patient that there may be a patient responsible charge related to this service. The patient expressed understanding and agreed to proceed.   Other persons participating in the visit and their role in the encounter: Patients son  [name]  Patient's son was called as per her's request to discuss the results of his PET CT scan and follow-up on how the patient is tolerating his recently started  [place]. Patient's son reports that the patient has been tolerating the Xtandi well.  He is still having back pains and some neck discomfort but no focal weakness.  Mild lower extremity weakness is better and the patient is able to ambulate at this time without any overt weakness.  We discussed that his PET CT scan showed very extensive involvement from metastatic prostate cancer including throughout his spine and given this finding and his significantly elevated PSA levels and since he is tolerating Xtandi at 80 mg p.o. daily reasonably well , we recommended increasing his dose of Xtandi to the full standard dose of 160 mg p.o. daily to try to achieve additional response to systemic therapy.  At this point the patient does not have very specific focal back pain or weakness and the patient's son in keeping with the patient's wishes would like to hold off on additional spinal imaging at this time.  Jacob Warner will be keeping an eye on Jacob Warner focal pain and focal neurological symptoms which will determine if we need additional imaging and consideration for focal radiation therapy.  No other acute new symptoms.  MEDICAL HISTORY:  Past Medical History:  Diagnosis Date   A-fib ([place])    Alopecia    Anemia    Bradycardia    Cancer ([place])    Cherry angioma    Echocardiogram    Echo 11/19: Mild concentric LVH, EF 50-55, normal wall motion, grade 1 diastolic dysfunction, mild AI, MAC, mild MR, normal RVSF, moderate TR, PASP 36   GERD (gastroesophageal reflux disease)    Glaucoma    Hypertension    Nuclear stress test    Nuclear stress test 11/19: not gated, inf and apical defect c/w small scar and/or soft tissue attenuation   Pelvic fracture ([facility])    Right BBB/left ant fasc block    Sick sinus syndrome ([place])    TIA (transient ischemic attack)    Xeroderma     SURGICAL HISTORY: Past Surgical History:  Procedure Laterality Date   APPENDECTOMY     CERVICAL SPINE SURGERY     EXPLORATORY  LAPAROTOMY     LITHOTRIPSY     PACEMAKER PLACEMENT     PPM GENERATOR CHANGEOUT N/A 05/10/2020   Procedure: PPM GENERATOR CHANGEOUT;  Surgeon: [name], MD;  Location: [facility];  Service: Cardiovascular;  Laterality: N/A;   SPINAL CORD STIMULATOR IMPLANT Bilateral 2018   [name], MD PhD, [facility]    SOCIAL HISTORY: Social History   Socioeconomic History   Marital status: Married    Spouse name: Not on file   Number of children: Not on file   Years of education: Not on file   Highest education level: Not on file  Occupational History   Not on file  Tobacco Use   Smoking status: Never   Smokeless tobacco: Never  Vaping Use   Vaping Use: Never used  Substance and Sexual Activity   Alcohol use: No    Alcohol/week: 0.0 standard drinks   Drug use: No  Sexual activity: Not on file  Other Topics Concern   Not on file  Social History Narrative   Not on file   Social Determinants of Health   Financial Resource Strain: Not on file  Food Insecurity: Not on file  Transportation Needs: Not on file  Physical Activity: Not on file  Stress: Not on file  Social Connections: Not on file  Intimate Partner Violence: Not on file    FAMILY HISTORY: Family History  Problem Relation Age of Onset   Brain cancer Sister     ALLERGIES:  is allergic to sulfamethoxazole-trimethoprim, sulfa antibiotics, and sulfacetamide sodium.  MEDICATIONS:  Current Outpatient Medications  Medication Sig Dispense Refill   acetaminophen (TYLENOL) 500 MG tablet Take 500-1,000 mg by mouth See admin instructions. 500 mg twice daily, 1000 mg at bedtime     azelastine (ASTELIN) 0.1 % nasal spray Place 1 spray into both nostrils 2 (two) times daily as needed for rhinitis.  (Patient not taking: Reported on 10/10/2021)     calcium carbonate (TUMS - DOSED IN MG ELEMENTAL CALCIUM) 500 MG chewable tablet 1 tablet     denosumab (PROLIA) 60 MG/ML SOSY injection  Inject 60 mg into the skin every 6 (six) months. (Patient not taking: Reported on 10/10/2021)     dutasteride (AVODART) 0.5 MG capsule Take 0.5 mg by mouth daily.     enzalutamide (XTANDI) 80 MG tablet Take 2 tablets (160 mg total) by mouth daily. 60 tablet 2   furosemide (LASIX) 40 MG tablet Take 40 mg by mouth daily.     gabapentin (NEURONTIN) 300 MG capsule Take 1 capsule (300 mg total) by mouth at bedtime. 90 capsule 1   HYDROcodone-acetaminophen (NORCO/VICODIN) 5-325 MG tablet Take 1 tablet by mouth every 6 (six) hours as needed for moderate pain or severe pain. 60 tablet 0   hydroxypropyl methylcellulose / hypromellose (ISOPTO TEARS / GONIOVISC) 2.5 % ophthalmic solution Place 1 drop into both eyes as needed for dry eyes.     ibuprofen (ADVIL) 200 MG tablet 1 tablet with food or milk as needed     ketoconazole (NIZORAL) 2 % shampoo Apply 1 application topically 2 (two) times a week.      lactulose (CHRONULAC) 10 GM/15ML solution Take 10 g by mouth daily. (Patient not taking: Reported on 10/10/2021)  0   latanoprost (XALATAN) 0.005 % ophthalmic solution Place 1 drop into both eyes at bedtime.  (Patient not taking: Reported on 10/10/2021)  11   magnesium oxide (MAG-OX) 400 MG tablet 1 tablet     Magnesium Oxide 400 (240 Mg) MG TABS Take 1 tablet by mouth 2 (two) times daily. (Patient not taking: Reported on 10/10/2021)  7   meloxicam (MOBIC) 15 MG tablet Take 15 mg by mouth daily. (Patient not taking: Reported on 10/10/2021)     montelukast (SINGULAIR) 10 MG tablet 1 tablet (Patient not taking: Reported on 10/10/2021)     nitroGLYCERIN (NITROSTAT) 0.4 MG SL tablet Place 1 tablet (0.4 mg total) under the tongue every 5 (five) minutes as needed for chest pain. 25 tablet 5   pantoprazole (PROTONIX) 40 MG tablet Take 1 tablet by mouth daily. (Patient not taking: Reported on 10/10/2021)     RESTASIS 0.05 % ophthalmic emulsion Place 1 drop into both eyes daily.   11   ROCKLATAN 0.02-0.005 % SOLN       topiramate (TOPAMAX) 25 MG tablet  (Patient not taking: Reported on 10/10/2021)     traZODone (DESYREL) 50 MG tablet  Take 50 mg by mouth at bedtime.   11   vitamin B-12 (CYANOCOBALAMIN) 1000 MCG tablet Take 1,000 mcg by mouth daily.     White Petrolatum-Mineral Oil (EYE LUBRICANT) OINT Apply 1 strip to eye at bedtime.     No current facility-administered medications for this visit.    REVIEW OF SYSTEMS:   .10 Point review of Systems was done is negative except as noted above.  PHYSICAL EXAMINATION: Telemedicine visit  LABORATORY DATA:  I have reviewed the data as listed  . CBC Latest Ref Rng & Units 10/17/2021 10/10/2021 09/19/2021  WBC 4.0 - 10.5 K/uL 5.6 5.2 5.8  Hemoglobin 13.0 - 17.0 g/dL 9.3(L) 9.4(L) 8.9(L)  Hematocrit 39.0 - 52.0 % 28.1(L) 28.8(L) 27.7(L)  Platelets 150 - 400 K/uL 173 155 165   . CBC    Component Value Date/Time   WBC 5.6 10/17/2021 1008   WBC 4.9 05/24/2021 1123   RBC 2.98 (L) 10/17/2021 1008   HGB 9.3 (L) 10/17/2021 1008   HGB 11.4 (L) 04/20/2020 1106   HGB 12.2 (L) 07/08/2017 1336   HCT 28.1 (L) 10/17/2021 1008   HCT 34.1 (L) 09/17/2020 1205   HCT 35.6 (L) 07/08/2017 1336   PLT 173 10/17/2021 1008   PLT 221 04/20/2020 1106   MCV 94.3 10/17/2021 1008   MCV 97 04/20/2020 1106   MCV 96.2 07/08/2017 1336   MCH 31.2 10/17/2021 1008   MCHC 33.1 10/17/2021 1008   RDW 13.8 10/17/2021 1008   RDW 13.2 04/20/2020 1106   RDW 13.9 07/08/2017 1336   LYMPHSABS 0.7 10/17/2021 1008   LYMPHSABS 1.3 04/20/2020 1106   LYMPHSABS 0.8 (L) 07/08/2017 1336   MONOABS 0.5 10/17/2021 1008   MONOABS 0.5 07/08/2017 1336   EOSABS 0.1 10/17/2021 1008   EOSABS 0.2 04/20/2020 1106   BASOSABS 0.0 10/17/2021 1008   BASOSABS 0.0 04/20/2020 1106   BASOSABS 0.0 07/08/2017 1336    . CMP Latest Ref Rng & Units 10/17/2021 10/10/2021 09/19/2021  Glucose 70 - 99 mg/dL 107(H) 113(H) 96  BUN 8 - 23 mg/dL 23 30(H) 33(H)  Creatinine 0.61 - 1.24 mg/dL 1.23 1.02 1.39(H)   Sodium 135 - 145 mmol/L 138 139 139  Potassium 3.5 - 5.1 mmol/L 4.2 4.2 4.7  Chloride 98 - 111 mmol/L 99 102 102  CO2 22 - 32 mmol/L _0 Calcium 8.9 - 10.3 mg/dL 9.5 9.4 9.4  Total Protein 6.5 - 8.1 g/dL 6.3(L) 6.4(L) 6.1(L)  Total Bilirubin 0.3 - 1.2 mg/dL 0.3 0.9 0.4  Alkaline Phos 38 - 126 U/L 328(H) 281(H) 290(H)  AST 15 - 41 U/L 13(L) 21 25  ALT 0 - 44 U/L _1 RADIOGRAPHIC STUDIES: I have personally reviewed the radiological images as listed and agreed with the findings in the report. NM PET (PSMA) SKULL TO MID THIGH  Result Date: 10/30/2021 CLINICAL DATA:  History of metastatic prostate cancer with increasing PSA (increased to 2,410) now with back pain. EXAM: NUCLEAR MEDICINE PET SKULL BASE TO THIGH TECHNIQUE: 9.1 mCi F18 Piflufolastat (Pylarify) was injected intravenously. Full-ring PET imaging was performed from the skull base to thigh after the radiotracer. CT data was obtained and used for attenuation correction and anatomic localization. COMPARISON:  FDG PET of October 29, 2021. FINDINGS: NECK LEFT supraclavicular lymph node (image 66/4) 9 mm with a maximum SUV of 20.3. Incidental CT finding: None CHEST Posterior mediastinal lymph nodes with radiotracer accumulation. Small pre-vascular lymph nodes also with radiotracer  accumulation. (Image 92/4) 4 mm lymph node with a maximum SUV of 9.0. Similar accumulation of radiotracer in tiny AP window lymph nodes on image 87. RIGHT retrocrural lymph nodes with marked radiotracer accumulation (image 129/4) largest 9 mm with a maximum SUV of 17.8. These lymph nodes are new. No suspicious pulmonary nodule. Incidental CT finding: Aortic atherosclerosis without aneurysmal dilation. Normal heart size. Cardiac pacer device in place, power pack over the RIGHT chest. LEFT-sided catheter in place as before appears abandoned within the soft tissues and is unchanged terminating in the mid to distal SVC. No adenopathy by size criteria in the  chest. ABDOMEN/PELVIS Prostate: Mild nonspecific uptake along the prostate bed on the LEFT. Lymph nodes: Numerous abnormal lymph nodes tracking from the pelvis to the retrocrural nodal station described above. (Image 195/4) RIGHT pelvic sidewall lymph node measuring 11 mm shows a maximum SUV of 19.5. This lymph node previously measured approximately 13 mm. RIGHT external iliac internal iliac lymph nodes (image 187/4) largest 13 mm with a maximum SUV of 25. Other lymph nodes along the RIGHT pelvic sidewall show similar FDG uptake tracking towards the retroperitoneum. This lymph node formally measured approximately 15 mm. Intra-aortocaval and retrocaval lymph nodes while small show uptake on image 153/4. On this same image there is a small LEFT periaortic lymph node with a maximum SUV of 26.6. Other scattered small retroperitoneal lymph nodes with similar increased activity and small LEFT common iliac lymph node on image 165/4 also with increased radiotracer accumulation. Liver: Heterogeneity of hepatic parenchyma in general. No visible lesion on the CT portion of the exam. More pronounced heterogeneity along the posterior margin of the RIGHT hemi liver (image 129/3) maximum SUV of 5.3 Incidental CT finding: No acute findings relative to liver, gallbladder, pancreas, spleen, adrenal glands or kidneys. Cortical scarring of the bilateral kidneys. LEFT renal cyst in the upper pole as on previous imaging. Soft tissue with stranding about the root of the small bowel mesentery is noted on the current study. There is soft tissue about the SMA (image 144/4) area measuring approximately 15 x 32 mm. This tracks into the small bowel mesentery and is largely similar to previous imaging perhaps with mild FDG uptake on the previous study but nearly matching blood pool and appearing separate from the pancreas no acute findings relative to the small bowel. Finding is grossly similar as far back as 2017. Stool throughout the colon.  SKELETON Widespread skeletal metastatic disease, all visualized bones with involvement. Calvarium and skull base with signs of involvement, for instance the LEFT occipital and temporal bones with a maximum SUV of 36, subtle sclerosis in this location. RIGHT lateral C1 with increased radiotracer accumulation with a maximum SUV of 32. Scattered small calvarial lesions and other lesions involving sphenoid on the LEFT and clivus on the RIGHT. Near diffuse involvement of the lower cervical spine (image 54/4) maximum SUV of 41.5. Similar near confluent/diffuse uptake throughout the thoracic spine and pronounced involvement also of the lumbar spine. Bilateral rib involvement with numerous scattered lesions showing intense radiotracer accumulation. Sternal involvement with a maximum SUV of 54.4 (image 99/4) clavicular heads LEFT greater than RIGHT with involvement and bilateral scapulae. Pelvic involvement of the bilateral iliac bones and pubic bones as well as more patchy involvement of the bilateral proximal femora. Sclerotic appearance of many of these areas, for instance LEFT acetabulum and iliac posteriorly with increased sclerosis but otherwise similar appearance. Increased sclerosis of RIGHT pubic bone and increased number of sclerotic areas throughout the  RIGHT iliac bone. New areas of sclerosis corresponding to areas of radiotracer accumulation throughout all areas of the spine with the exception of subtle sclerotic foci in the midthoracic spine the correspond areas of increased metabolic activity on the prior PET scan. Cervical spine with near diffuse sclerosis corresponding to areas of uptake also new. Nerve stimulator remaining in place, power pack over the LEFT hip. Signs of prior fracture of the LEFT pubic bone similar to previous imaging IMPRESSION: Marked interval worsening of bony metastases with near confluent involvement of lower cervical spine through the lumbar spine, widespread involvement of the  remainder of the cervical spine, pelvis, ribs and calvarium. All visualized bones are involved. Despite decreased size of lymph nodes in the pelvis and retroperitoneum there is marked radiotracer accumulation in these areas compatible with prostate cancer metastases and new lymph nodes in the chest and lower neck as described. Heterogeneity of the liver is not definitive for metastatic process but does raise the question, suggest attention on follow-up as appropriate. Soft tissue at the root of the mesentery is little changed dating back to 2017 likely reflecting evidence of treated lymphoma. Of note there is some cross reactivity with PSMA and lymphoma. The intense nature of radiotracer accumulation and the clinical findings of elevated PSA are noted and findings are therefore more compatible with prostate cancer metastases. Aortic Atherosclerosis (ICD10-I70.0). Electronically Signed   By: Zetta Bills M.D.   On: 10/30/2021 11:32    ASSESSMENT & PLAN:   87 yo with   1) Metastatic prostate cancer with bone metastases and especially left hip pain.  Concern for progression on Lupron and bisphosphonates.  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 2) Bone metastases 3) h/o follicular lymphoma in 8546 rx with R-CVP 4) Mild macrocytic anemia - likely from metastatic prostate cancer.   PLAN: -Discussed his PSMA PET CT scan which showed very extensive involvement from metastatic prostate cancer including throughout his spine and other areas as detailed above. -Given significant progression on PET and his significantly elevated PSA levels and since he is tolerating Xtandi at 80 mg p.o. daily reasonably well , we recommended increasing his dose of Xtandi to the full standard dose of 160 mg p.o. daily to try to achieve additional response to systemic therapy.  At this point the patient does not have very specific focal back pain or weakness and the patient's son in keeping with the patient's wishes would like to hold  off on additional spinal imaging at this time.  Jacob Warner will be keeping an eye on Mr. Jerik Falletta focal pain and focal neurological symptoms which will determine if we need additional imaging and consideration for focal radiation therapy.  --Continue Lupron q30month at this time. -Continue Aredia qmonthly to reduce risks of fractures or bone hardening. -Recommended pt take 5000 IU Vitamin D daily.  -Advised pt he can use OTC Voltaren gel for application over painful area in back. Recommended OTC 4% Lidocaine patch for local pain control. -He does have hydrocodone/Tylenol to use as needed for pain.    FOLLOW UP: Please add MD visit to 11/14/21 appointment.will see in infusion  . The total time spent in the appointment was 20 minutes and more than 50% was on counseling and direct patient cares.  All of the patient's questions were answered with apparent satisfaction. The patient knows to call the clinic with any problems, questions or concerns.  GSullivan LoneMD MChrismanAAHIVMS SChi Memorial Hospital-GeorgiaCOverlook Medical CenterHematology/Oncology Physician CLebanon Va Medical Center

## 2021-11-07 ENCOUNTER — Other Ambulatory Visit (HOSPITAL_COMMUNITY): Payer: Self-pay

## 2021-11-09 ENCOUNTER — Other Ambulatory Visit (HOSPITAL_COMMUNITY): Payer: Self-pay

## 2021-11-11 ENCOUNTER — Other Ambulatory Visit (HOSPITAL_COMMUNITY): Payer: Self-pay

## 2021-11-11 ENCOUNTER — Ambulatory Visit (INDEPENDENT_AMBULATORY_CARE_PROVIDER_SITE_OTHER): Payer: Medicare PPO

## 2021-11-11 DIAGNOSIS — I495 Sick sinus syndrome: Secondary | ICD-10-CM

## 2021-11-11 LAB — CUP PACEART REMOTE DEVICE CHECK
Battery Remaining Longevity: 96 mo
Battery Remaining Percentage: 100 %
Brady Statistic RA Percent Paced: 33 %
Brady Statistic RV Percent Paced: 27 %
Date Time Interrogation Session: 20221114040200
Implantable Lead Implant Date: 20130926
Implantable Lead Implant Date: 20130926
Implantable Lead Location: 753859
Implantable Lead Location: 753860
Implantable Lead Model: 4456
Implantable Lead Model: 4476
Implantable Lead Serial Number: 473023
Implantable Lead Serial Number: 523746
Implantable Pulse Generator Implant Date: 20210513
Lead Channel Impedance Value: 339 Ohm
Lead Channel Impedance Value: 391 Ohm
Lead Channel Pacing Threshold Amplitude: 0.5 V
Lead Channel Pacing Threshold Pulse Width: 0.4 ms
Lead Channel Setting Pacing Amplitude: 2 V
Lead Channel Setting Pacing Amplitude: 2.5 V
Lead Channel Setting Pacing Pulse Width: 0.4 ms
Lead Channel Setting Sensing Sensitivity: 2.5 mV
Pulse Gen Serial Number: 528325

## 2021-11-13 ENCOUNTER — Ambulatory Visit: Payer: Medicare PPO

## 2021-11-14 ENCOUNTER — Inpatient Hospital Stay (HOSPITAL_BASED_OUTPATIENT_CLINIC_OR_DEPARTMENT_OTHER): Payer: Medicare PPO | Admitting: Hematology

## 2021-11-14 ENCOUNTER — Inpatient Hospital Stay: Payer: Medicare PPO

## 2021-11-14 ENCOUNTER — Other Ambulatory Visit: Payer: Self-pay

## 2021-11-14 ENCOUNTER — Ambulatory Visit: Payer: Medicare PPO | Admitting: Hematology

## 2021-11-14 VITALS — BP 138/63 | HR 82 | Temp 97.7°F | Resp 16 | Ht 70.0 in | Wt 168.0 lb

## 2021-11-14 DIAGNOSIS — C61 Malignant neoplasm of prostate: Secondary | ICD-10-CM

## 2021-11-14 DIAGNOSIS — C7951 Secondary malignant neoplasm of bone: Secondary | ICD-10-CM

## 2021-11-14 LAB — CBC WITH DIFFERENTIAL/PLATELET
Abs Immature Granulocytes: 0.05 10*3/uL (ref 0.00–0.07)
Basophils Absolute: 0 10*3/uL (ref 0.0–0.1)
Basophils Relative: 1 %
Eosinophils Absolute: 0.1 10*3/uL (ref 0.0–0.5)
Eosinophils Relative: 2 %
HCT: 30.7 % — ABNORMAL LOW (ref 39.0–52.0)
Hemoglobin: 9.9 g/dL — ABNORMAL LOW (ref 13.0–17.0)
Immature Granulocytes: 1 %
Lymphocytes Relative: 13 %
Lymphs Abs: 0.7 10*3/uL (ref 0.7–4.0)
MCH: 31.2 pg (ref 26.0–34.0)
MCHC: 32.2 g/dL (ref 30.0–36.0)
MCV: 96.8 fL (ref 80.0–100.0)
Monocytes Absolute: 0.5 10*3/uL (ref 0.1–1.0)
Monocytes Relative: 9 %
Neutro Abs: 4.1 10*3/uL (ref 1.7–7.7)
Neutrophils Relative %: 74 %
Platelets: 184 10*3/uL (ref 150–400)
RBC: 3.17 MIL/uL — ABNORMAL LOW (ref 4.22–5.81)
RDW: 15.1 % (ref 11.5–15.5)
WBC: 5.5 10*3/uL (ref 4.0–10.5)
nRBC: 0 % (ref 0.0–0.2)

## 2021-11-14 LAB — CMP (CANCER CENTER ONLY)
ALT: 6 U/L (ref 0–44)
AST: 13 U/L — ABNORMAL LOW (ref 15–41)
Albumin: 3.8 g/dL (ref 3.5–5.0)
Alkaline Phosphatase: 275 U/L — ABNORMAL HIGH (ref 38–126)
Anion gap: 12 (ref 5–15)
BUN: 32 mg/dL — ABNORMAL HIGH (ref 8–23)
CO2: 27 mmol/L (ref 22–32)
Calcium: 9.4 mg/dL (ref 8.9–10.3)
Chloride: 98 mmol/L (ref 98–111)
Creatinine: 1.21 mg/dL (ref 0.61–1.24)
GFR, Estimated: 55 mL/min — ABNORMAL LOW (ref >60)
Glucose, Bld: 85 mg/dL (ref 70–99)
Potassium: 4.6 mmol/L (ref 3.5–5.1)
Sodium: 137 mmol/L (ref 135–145)
Total Bilirubin: 0.5 mg/dL (ref 0.3–1.2)
Total Protein: 6.5 g/dL (ref 6.5–8.1)

## 2021-11-14 MED ORDER — LEUPROLIDE ACETATE (3 MONTH) 22.5 MG ~~LOC~~ KIT
22.5000 mg | PACK | Freq: Once | SUBCUTANEOUS | Status: AC
  Administered 2021-11-14: 13:00:00 22.5 mg via SUBCUTANEOUS
  Filled 2021-11-14: qty 22.5

## 2021-11-14 MED ORDER — SODIUM CHLORIDE 0.9 % IV SOLN: Freq: Once | INTRAVENOUS | Status: AC

## 2021-11-14 MED ORDER — SODIUM CHLORIDE 0.9 % IV SOLN
60.0000 mg | Freq: Once | INTRAVENOUS | Status: AC
  Administered 2021-11-14: 12:00:00 60 mg via INTRAVENOUS
  Filled 2021-11-14: qty 10

## 2021-11-14 NOTE — Patient Instructions (Signed)
Pamidronate Injection What is this medication? PAMIDRONATE (pa mi DROE nate) slows calcium loss from bones. It treats Paget's disease and high calcium levels in the blood from some kinds of cancer. It may be used in other people at risk for bone loss. This medicine may be used for other purposes; ask your health care provider or pharmacist if you have questions. COMMON BRAND NAME(S): Aredia What should I tell my care team before I take this medication? They need to know if you have any of these conditions: bleeding disorder cancer dental disease kidney disease low levels of calcium or other minerals in the blood low red blood cell counts receiving steroids like dexamethasone or prednisone an unusual or allergic reaction to pamidronate, other drugs, foods, dyes or preservatives pregnant or trying to get pregnant breast-feeding How should I use this medication? This drug is injected into a vein. It is given by a health care provider in a hospital or clinic setting. Talk to your health care provider about the use of this drug in children. Special care may be needed. Overdosage: If you think you have taken too much of this medicine contact a poison control center or emergency room at once. NOTE: This medicine is only for you. Do not share this medicine with others. What if I miss a dose? Keep appointments for follow-up doses. It is important not to miss your dose. Call your health care provider if you are unable to keep an appointment. What may interact with this medication? certain antibiotics given by injection medicines for inflammation or pain like ibuprofen, naproxen some diuretics like bumetanide, furosemide cyclosporine parathyroid hormone tacrolimus teriparatide thalidomide This list may not describe all possible interactions. Give your health care provider a list of all the medicines, herbs, non-prescription drugs, or dietary supplements you use. Also tell them if you smoke,  drink alcohol, or use illegal drugs. Some items may interact with your medicine. What should I watch for while using this medication? Visit your health care provider for regular checks on your progress. It may be some time before you see the benefit from this drug. Some people who take this drug have severe bone, joint, or muscle pain. This drug may also increase your risk for jaw problems or a broken thigh bone. Tell your health care provider right away if you have severe pain in your jaw, bones, joints, or muscles. Tell you health care provider if you have any pain that does not go away or that gets worse. Tell your dentist and dental surgeon that you are taking this drug. You should not have major dental surgery while on this drug. See your dentist to have a dental exam and fix any dental problems before starting this drug. Take good care of your teeth while on this drug. Make sure you see your dentist for regular follow-up appointments. You should make sure you get enough calcium and vitamin D while you are taking this drug. Discuss the foods you eat and the vitamins you take with your health care provider. You may need blood work done while you are taking this drug. Do not become pregnant while taking this drug. Women should inform their health care provider if they wish to become pregnant or think they might be pregnant. There is potential for serious harm to an unborn child. Talk to your health care provider for more information. What side effects may I notice from receiving this medication? Side effects that you should report to your doctor or health care  provider as soon as possible: allergic reactions (skin rash, itching or hives; swelling of the face, lips, or tongue) bleeding (bloody or black, tarry stools; red or dark brown urine; spitting up blood or brown material that looks like coffee grounds; red spots on the skin; unusual bruising or bleeding from the eyes, gums, or nose) bone  pain increased thirst infection (fever, chills, cough, sore throat, pain or trouble passing urine) jaw pain, especially after dental work joint pain kidney injury (trouble passing urine or change in the amount of urine) low calcium levels (fast heartbeat; muscle cramps or pain; pain, tingling, or numbness in the hands or feet; seizures) low magnesium levels (fast, irregular heartbeat; muscle cramp or pain; muscle weakness; tremors; seizures) low potassium levels (trouble breathing; chest pain; dizziness; fast, irregular heartbeat; feeling faint or lightheaded, falls; muscle cramps or pain) muscle pain pain, redness, or irritation at site where injected redness, blistering, peeling, or loosening of the skin, including inside the mouth severe diarrhea unusual sweating Side effects that usually do not require medical attention (report to your doctor or health care provider if they continue or are bothersome): constipation eye irritation, itching, or pain fever headache increase in blood pressure loss of appetite nausea stomach pain unusually weak or tired vomiting This list may not describe all possible side effects. Call your doctor for medical advice about side effects. You may report side effects to FDA at 1-800-FDA-1088. Where should I keep my medication? This drug is given in a hospital or clinic. It will not be stored at home. NOTE: This sheet is a summary. It may not cover all possible information. If you have questions about this medicine, talk to your doctor, pharmacist, or health care provider.  Leuprolide injection What is this medication? LEUPROLIDE (loo PROE lide) is a man-made hormone. It is used to treat the symptoms of prostate cancer. This medicine may also be used to treat children with early onset of puberty. It may be used for other hormonal conditions. This medicine may be used for other purposes; ask your health care provider or pharmacist if you have  questions. COMMON BRAND NAME(S): Lupron What should I tell my care team before I take this medication? They need to know if you have any of these conditions: diabetes heart disease or previous heart attack high blood pressure high cholesterol pain or difficulty passing urine spinal cord metastasis stroke tobacco smoker an unusual or allergic reaction to leuprolide, benzyl alcohol, other medicines, foods, dyes, or preservatives pregnant or trying to get pregnant breast-feeding How should I use this medication? This medicine is for injection under the skin or into a muscle. You will be taught how to prepare and give this medicine. Use exactly as directed. Take your medicine at regular intervals. Do not take your medicine more often than directed. It is important that you put your used needles and syringes in a special sharps container. Do not put them in a trash can. If you do not have a sharps container, call your pharmacist or healthcare provider to get one. A special MedGuide will be given to you by the pharmacist with each prescription and refill. Be sure to read this information carefully each time. Talk to your pediatrician regarding the use of this medicine in children. While this medicine may be prescribed for children as young as 8 years for selected conditions, precautions do apply. Overdosage: If you think you have taken too much of this medicine contact a poison control center or emergency room at  once. NOTE: This medicine is only for you. Do not share this medicine with others. What if I miss a dose? If you miss a dose, take it as soon as you can. If it is almost time for your next dose, take only that dose. Do not take double or extra doses. What may interact with this medication? Do not take this medicine with any of the following medications: chasteberry cisapride dronedarone pimozide thioridazine This medicine may also interact with the following medications: herbal or  dietary supplements, like black cohosh or DHEA male hormones, like estrogens or progestins and birth control pills, patches, rings, or injections male hormones, like testosterone other medicines that prolong the QT interval (abnormal heart rhythm) This list may not describe all possible interactions. Give your health care provider a list of all the medicines, herbs, non-prescription drugs, or dietary supplements you use. Also tell them if you smoke, drink alcohol, or use illegal drugs. Some items may interact with your medicine. What should I watch for while using this medication? Visit your doctor or health care professional for regular checks on your progress. During the first week, your symptoms may get worse, but then will improve as you continue your treatment. You may get hot flashes, increased bone pain, increased difficulty passing urine, or an aggravation of nerve symptoms. Discuss these effects with your doctor or health care professional, some of them may improve with continued use of this medicine. Male patients may experience a menstrual cycle or spotting during the first 2 months of therapy with this medicine. If this continues, contact your doctor or health care professional. This medicine may increase blood sugar. Ask your healthcare provider if changes in diet or medicines are needed if you have diabetes. What side effects may I notice from receiving this medication? Side effects that you should report to your doctor or health care professional as soon as possible: allergic reactions like skin rash, itching or hives, swelling of the face, lips, or tongue breathing problems chest pain depression or memory disorders pain in your legs or groin pain at site where injected severe headache signs and symptoms of high blood sugar such as being more thirsty or hungry or having to urinate more than normal. You may also feel very tired or have blurry vision swelling of the feet and  legs visual changes vomiting Side effects that usually do not require medical attention (report to your doctor or health care professional if they continue or are bothersome): breast swelling or tenderness decrease in sex drive or performance diarrhea hot flashes loss of appetite muscle, joint, or bone pains nausea redness or irritation at site where injected skin problems or acne This list may not describe all possible side effects. Call your doctor for medical advice about side effects. You may report side effects to FDA at 1-800-FDA-1088. Where should I keep my medication? Keep out of the reach of children. Store below 25 degrees C (77 degrees F). Do not freeze. Protect from light. Do not use if it is not clear or if there are particles present. Throw away any unused medicine after the expiration date. NOTE: This sheet is a summary. It may not cover all possible information. If you have questions about this medicine, talk to your doctor, pharmacist, or health care provider.  2022 [employer] (2021-09-03 00:00:00)   2022 [employer] (2021-09-03 00:00:00)

## 2021-11-15 LAB — PSA, TOTAL AND FREE
PSA, Free Pct: >4.8 %
PSA, Free: >50 ng/mL
Prostate Specific Ag, Serum: 1046 ng/mL — ABNORMAL HIGH (ref 0.0–4.0)

## 2021-11-19 NOTE — Progress Notes (Signed)
Remote pacemaker transmission.   

## 2021-11-20 ENCOUNTER — Other Ambulatory Visit: Payer: Medicare PPO

## 2021-11-20 ENCOUNTER — Ambulatory Visit: Payer: Medicare PPO

## 2021-11-21 ENCOUNTER — Encounter: Payer: Self-pay | Admitting: Hematology

## 2021-11-25 ENCOUNTER — Other Ambulatory Visit: Payer: Self-pay

## 2021-11-25 NOTE — Progress Notes (Signed)
HEMATOLOGY/ONCOLOGY CLINIC NOTE  Date of Service: .11/14/2021   Patient Care Team: Jacob Small, MD (Inactive) as PCP - General (Family Medicine) Jacob Spark, MD (Inactive) as PCP - Cardiology (Cardiology) Jacob Lance, MD as PCP - Electrophysiology (Cardiology)  CHIEF COMPLAINTS/PURPOSE OF CONSULTATION:  F/u for recently diagnosed prostate cancer with bone metastases Hx of Follicular Lymphoma  HISTORY OF PRESENTING ILLNESS:   Jacob Warner is a wonderful 87 y.o. male who has been referred to [place] by Dr. Maurice Warner for evaluation and management of abnormal CBC/history of follicular lymphoma. Pt is accompanied today by his son. The pt reports that he is doing well overall.   The pt reports that he was diagnosed with and treated for Follicular Lymphoma in 4492. He completed 5 months of Ritxuan, Cytoxan, and Prednisone. He has since been in remission.   A few months ago pt began having pain in his right-sided lower back. He found relief from this discomfort after he increased his water intake. The pain as since returned and remains despite the pt continuing to drink an adequate amount of water. Pt was referred to a Nephrologist after discussing his back pain with Dr. Justin Warner, his PCP. Pt has previously had kidney stones and has been told that he has BPH, but this has not disturbed his urinary habits. He denies seeing any blood in his urine, but was found to have hematuria on recent labs with Dr. Justin Warner. Pt had an abdominal US in September, which showed a benign left kidney cyst. Pt has chronic back pain for which he receives steroid injections.    He has no history of thyroid disorders. Pt takes a baby Aspirin for his Afib and Pantoprazole daily. Pt has a history of dysphagia and has required esophageal dilation. He has lost nearly 40 lbs in the last six months. Pt has been eating less the last few months due to a decreased appetite and a change in routine. He also reports smaller  stools in the last 6-8 months. His stools used to be round, but are now much thinner. This is completely new and persistent.   Of note since the patient's last visit, pt has had [place] Abd ([id number]) completed on 09/04/2020 with results revealing "Stable 2.8 x 3.5 x 2.3 cm left superior pole simple renal cyst. Otherwise unremarkable upper abdominal ultrasound with the pancreas not visualized."  Most recent lab results (08/21/2020) of CBC w/diff and CMP is as follows: all values are WNL except for RBC at 3.05, Hgb at 9.9, HCT at 29.3, MCV at 96.0, Neutro Rel at 73.8, Lymphs Rel at 10.3, Lymphs Abs at 0.90K, Glucose at 103, BUN at 32, Creatinine at 1.46, GFR Est Non Af Am at 45, ALP at 140.  On review of systems, pt reports dysphagia, weight loss, low appetite, back pain, change in appearance of stools, neuropathy and denies nausea, abdominal pain, hematuria, fevers, chills, night sweats, new lumps/bumps, bloody stools, abdominal pain, testicular pain/swelling and any other symptoms.   On PMHx the pt reports Follicular Lymphoma, Cervical Spine Surgery, Spinal Cord Stimulator Implant, Afib, Pelvic fracture, Reflux, BPH, Kidney Stones.   INTERVAL HISTORY  Jacob Warner was seen in follow-up for his metastatic prostate cancer.  He notes he has been doing okay.  His Xtandi was increased to 160 mg p.o. daily about 2 weeks ago after his PET scan showed pretty extensive involvement from his metastatic prostate cancer and his PSA levels were noted to be significantly elevated. He notes  he is tolerating the Xtandi well without any acute issues. He has grade 1 fatigue but otherwise has been eating well and notes no other acute new symptoms. He notes that he is aware that this treatment is to delay the progression of the cancer and buy him some time and that it is not curative. No new focal back pain.  No loss of bowel or bladder control.  No new lower extremity pain or swelling. I called and gave Jacob Warner his son an  update after the clinic visit. No other acute new symptoms.  MEDICAL HISTORY:  Past Medical History:  Diagnosis Date   A-fib ([place])    Alopecia    Anemia    Bradycardia    Cancer ([place])    Cherry angioma    Echocardiogram    Echo 11/19: Mild concentric LVH, EF 50-55, normal wall motion, grade 1 diastolic dysfunction, mild AI, MAC, mild MR, normal RVSF, moderate TR, PASP 36   GERD (gastroesophageal reflux disease)    Glaucoma    Hypertension    Nuclear stress test    Nuclear stress test 11/19: not gated, inf and apical defect c/w Warner scar and/or soft tissue attenuation   Pelvic fracture ([facility])    Right BBB/left ant fasc block    Sick sinus syndrome ([place])    TIA (transient ischemic attack)    Xeroderma     SURGICAL HISTORY: Past Surgical History:  Procedure Laterality Date   APPENDECTOMY     CERVICAL SPINE SURGERY     EXPLORATORY LAPAROTOMY     LITHOTRIPSY     PACEMAKER PLACEMENT     PPM GENERATOR CHANGEOUT N/A 05/10/2020   Procedure: PPM GENERATOR CHANGEOUT;  Surgeon: Jacob Lance, MD;  Location: [facility];  Service: Cardiovascular;  Laterality: N/A;   SPINAL CORD STIMULATOR IMPLANT Bilateral 2018   [name], MD PhD, [facility]    SOCIAL HISTORY: Social History   Socioeconomic History   Marital status: Married    Spouse name: Not on file   Number of children: Not on file   Years of education: Not on file   Highest education level: Not on file  Occupational History   Not on file  Tobacco Use   Smoking status: Never   Smokeless tobacco: Never  Vaping Use   Vaping Use: Never used  Substance and Sexual Activity   Alcohol use: No    Alcohol/week: 0.0 standard drinks   Drug use: No   Sexual activity: Not on file  Other Topics Concern   Not on file  Social History Narrative   Not on file   Social Determinants of Health   Financial Resource Strain: Not on file  Food Insecurity: Not on file  Transportation  Needs: Not on file  Physical Activity: Not on file  Stress: Not on file  Social Connections: Not on file  Intimate Partner Violence: Not on file    FAMILY HISTORY: Family History  Problem Relation Age of Onset   Brain cancer Sister     ALLERGIES:  is allergic to sulfamethoxazole-trimethoprim, sulfa antibiotics, and sulfacetamide sodium.  MEDICATIONS:  Current Outpatient Medications  Medication Sig Dispense Refill   acetaminophen (TYLENOL) 500 MG tablet Take 500-1,000 mg by mouth See admin instructions. 500 mg twice daily, 1000 mg at bedtime     azelastine (ASTELIN) 0.1 % nasal spray Place 1 spray into both nostrils 2 (two) times daily as needed for rhinitis.  (Patient not taking: Reported on  10/10/2021)     calcium carbonate (TUMS - DOSED IN MG ELEMENTAL CALCIUM) 500 MG chewable tablet 1 tablet     denosumab (PROLIA) 60 MG/ML SOSY injection Inject 60 mg into the skin every 6 (six) months. (Patient not taking: Reported on 10/10/2021)     dutasteride (AVODART) 0.5 MG capsule Take 0.5 mg by mouth daily.     enzalutamide (XTANDI) 80 MG tablet Take 2 tablets (160 mg total) by mouth daily. 60 tablet 2   furosemide (LASIX) 40 MG tablet Take 40 mg by mouth daily.     gabapentin (NEURONTIN) 300 MG capsule Take 1 capsule (300 mg total) by mouth at bedtime. 90 capsule 1   HYDROcodone-acetaminophen (NORCO/VICODIN) 5-325 MG tablet Take 1 tablet by mouth every 6 (six) hours as needed for moderate pain or severe pain. 60 tablet 0   hydroxypropyl methylcellulose / hypromellose (ISOPTO TEARS / GONIOVISC) 2.5 % ophthalmic solution Place 1 drop into both eyes as needed for dry eyes.     ibuprofen (ADVIL) 200 MG tablet 1 tablet with food or milk as needed     ketoconazole (NIZORAL) 2 % shampoo Apply 1 application topically 2 (two) times a week.      lactulose (CHRONULAC) 10 GM/15ML solution Take 10 g by mouth daily. (Patient not taking: Reported on 10/10/2021)  0   latanoprost (XALATAN) 0.005 % ophthalmic  solution Place 1 drop into both eyes at bedtime.  (Patient not taking: Reported on 10/10/2021)  11   magnesium oxide (MAG-OX) 400 MG tablet 1 tablet     Magnesium Oxide 400 (240 Mg) MG TABS Take 1 tablet by mouth 2 (two) times daily. (Patient not taking: Reported on 10/10/2021)  7   meloxicam (MOBIC) 15 MG tablet Take 15 mg by mouth daily. (Patient not taking: Reported on 10/10/2021)     montelukast (SINGULAIR) 10 MG tablet 1 tablet (Patient not taking: Reported on 10/10/2021)     nitroGLYCERIN (NITROSTAT) 0.4 MG SL tablet Place 1 tablet (0.4 mg total) under the tongue every 5 (five) minutes as needed for chest pain. 25 tablet 5   pantoprazole (PROTONIX) 40 MG tablet Take 1 tablet by mouth daily. (Patient not taking: Reported on 10/10/2021)     RESTASIS 0.05 % ophthalmic emulsion Place 1 drop into both eyes daily.   11   ROCKLATAN 0.02-0.005 % SOLN      topiramate (TOPAMAX) 25 MG tablet  (Patient not taking: Reported on 10/10/2021)     traZODone (DESYREL) 50 MG tablet Take 50 mg by mouth at bedtime.   11   vitamin B-12 (CYANOCOBALAMIN) 1000 MCG tablet Take 1,000 mcg by mouth daily.     White Petrolatum-Mineral Oil (EYE LUBRICANT) OINT Apply 1 strip to eye at bedtime.     No current facility-administered medications for this visit.    REVIEW OF SYSTEMS:   .10 Point review of Systems was done is negative except as noted above.  PHYSICAL EXAMINATION: Telemedicine visit  LABORATORY DATA:  I have reviewed the data as listed  . CBC Latest Ref Rng & Units 11/14/2021 10/17/2021 10/10/2021  WBC 4.0 - 10.5 K/uL 5.5 5.6 5.2  Hemoglobin 13.0 - 17.0 g/dL 9.9(L) 9.3(L) 9.4(L)  Hematocrit 39.0 - 52.0 % 30.7(L) 28.1(L) 28.8(L)  Platelets 150 - 400 K/uL 184 173 155   . CBC    Component Value Date/Time   WBC 5.5 11/14/2021 1055   RBC 3.17 (L) 11/14/2021 1055   HGB 9.9 (L) 11/14/2021 1055   HGB 9.3 (L)  10/17/2021 1008   HGB 11.4 (L) 04/20/2020 1106   HGB 12.2 (L) 07/08/2017 1336   HCT 30.7  (L) 11/14/2021 1055   HCT 34.1 (L) 09/17/2020 1205   HCT 35.6 (L) 07/08/2017 1336   PLT 184 11/14/2021 1055   PLT 173 10/17/2021 1008   PLT 221 04/20/2020 1106   MCV 96.8 11/14/2021 1055   MCV 97 04/20/2020 1106   MCV 96.2 07/08/2017 1336   MCH 31.2 11/14/2021 1055   MCHC 32.2 11/14/2021 1055   RDW 15.1 11/14/2021 1055   RDW 13.2 04/20/2020 1106   RDW 13.9 07/08/2017 1336   LYMPHSABS 0.7 11/14/2021 1055   LYMPHSABS 1.3 04/20/2020 1106   LYMPHSABS 0.8 (L) 07/08/2017 1336   MONOABS 0.5 11/14/2021 1055   MONOABS 0.5 07/08/2017 1336   EOSABS 0.1 11/14/2021 1055   EOSABS 0.2 04/20/2020 1106   BASOSABS 0.0 11/14/2021 1055   BASOSABS 0.0 04/20/2020 1106   BASOSABS 0.0 07/08/2017 1336    . CMP Latest Ref Rng & Units 11/14/2021 10/17/2021 10/10/2021  Glucose 70 - 99 mg/dL 85 107(H) 113(H)  BUN 8 - 23 mg/dL 32(H) 23 30(H)  Creatinine 0.61 - 1.24 mg/dL 1.21 1.23 1.02  Sodium 135 - 145 mmol/L 137 138 139  Potassium 3.5 - 5.1 mmol/L 4.6 4.2 4.2  Chloride 98 - 111 mmol/L 98 99 102  CO2 22 - 32 mmol/L _0 Calcium 8.9 - 10.3 mg/dL 9.4 9.5 9.4  Total Protein 6.5 - 8.1 g/dL 6.5 6.3(L) 6.4(L)  Total Bilirubin 0.3 - 1.2 mg/dL 0.5 0.3 0.9  Alkaline Phos 38 - 126 U/L 275(H) 328(H) 281(H)  AST 15 - 41 U/L 13(L) 13(L) 21  ALT 0 - 44 U/L _1 RADIOGRAPHIC STUDIES: I have personally reviewed the radiological images as listed and agreed with the findings in the report. CUP PACEART REMOTE DEVICE CHECK  Result Date: 11/11/2021 Scheduled remote reviewed. Normal device function.  1 ATR, brief PAT 5 PMT appropriately treated without termination Next remote 91 days. LR  NM PET (PSMA) SKULL TO MID THIGH  Result Date: 10/30/2021 CLINICAL DATA:  History of metastatic prostate cancer with increasing PSA (increased to 2,410) now with back pain. EXAM: NUCLEAR MEDICINE PET SKULL BASE TO THIGH TECHNIQUE: 9.1 mCi F18 Piflufolastat (Pylarify) was injected intravenously. Full-ring PET imaging  was performed from the skull base to thigh after the radiotracer. CT data was obtained and used for attenuation correction and anatomic localization. COMPARISON:  FDG PET of October 29, 2021. FINDINGS: NECK LEFT supraclavicular lymph node (image 66/4) 9 mm with a maximum SUV of 20.3. Incidental CT finding: None CHEST Posterior mediastinal lymph nodes with radiotracer accumulation. Warner pre-vascular lymph nodes also with radiotracer accumulation. (Image 92/4) 4 mm lymph node with a maximum SUV of 9.0. Similar accumulation of radiotracer in tiny AP window lymph nodes on image 87. RIGHT retrocrural lymph nodes with marked radiotracer accumulation (image 129/4) largest 9 mm with a maximum SUV of 17.8. These lymph nodes are new. No suspicious pulmonary nodule. Incidental CT finding: Aortic atherosclerosis without aneurysmal dilation. Normal heart size. Cardiac pacer device in place, power pack over the RIGHT chest. LEFT-sided catheter in place as before appears abandoned within the soft tissues and is unchanged terminating in the mid to distal SVC. No adenopathy by size criteria in the chest. ABDOMEN/PELVIS Prostate: Mild nonspecific uptake along the prostate bed on the LEFT. Lymph nodes: Numerous abnormal lymph nodes tracking from the pelvis to the retrocrural  nodal station described above. (Image 195/4) RIGHT pelvic sidewall lymph node measuring 11 mm shows a maximum SUV of 19.5. This lymph node previously measured approximately 13 mm. RIGHT external iliac internal iliac lymph nodes (image 187/4) largest 13 mm with a maximum SUV of 25. Other lymph nodes along the RIGHT pelvic sidewall show similar FDG uptake tracking towards the retroperitoneum. This lymph node formally measured approximately 15 mm. Intra-aortocaval and retrocaval lymph nodes while Warner show uptake on image 153/4. On this same image there is a Warner LEFT periaortic lymph node with a maximum SUV of 26.6. Other scattered Warner retroperitoneal lymph  nodes with similar increased activity and Warner LEFT common iliac lymph node on image 165/4 also with increased radiotracer accumulation. Liver: Heterogeneity of hepatic parenchyma in general. No visible lesion on the CT portion of the exam. More pronounced heterogeneity along the posterior margin of the RIGHT hemi liver (image 129/3) maximum SUV of 5.3 Incidental CT finding: No acute findings relative to liver, gallbladder, pancreas, spleen, adrenal glands or kidneys. Cortical scarring of the bilateral kidneys. LEFT renal cyst in the upper pole as on previous imaging. Soft tissue with stranding about the root of the Warner bowel mesentery is noted on the current study. There is soft tissue about the SMA (image 144/4) area measuring approximately 15 x 32 mm. This tracks into the Warner bowel mesentery and is largely similar to previous imaging perhaps with mild FDG uptake on the previous study but nearly matching blood pool and appearing separate from the pancreas no acute findings relative to the Warner bowel. Finding is grossly similar as far back as 2017. Stool throughout the colon. SKELETON Widespread skeletal metastatic disease, all visualized bones with involvement. Calvarium and skull base with signs of involvement, for instance the LEFT occipital and temporal bones with a maximum SUV of 36, subtle sclerosis in this location. RIGHT lateral C1 with increased radiotracer accumulation with a maximum SUV of 32. Scattered Warner calvarial lesions and other lesions involving sphenoid on the LEFT and clivus on the RIGHT. Near diffuse involvement of the lower cervical spine (image 54/4) maximum SUV of 41.5. Similar near confluent/diffuse uptake throughout the thoracic spine and pronounced involvement also of the lumbar spine. Bilateral rib involvement with numerous scattered lesions showing intense radiotracer accumulation. Sternal involvement with a maximum SUV of 54.4 (image 99/4) clavicular heads LEFT greater than  RIGHT with involvement and bilateral scapulae. Pelvic involvement of the bilateral iliac bones and pubic bones as well as more patchy involvement of the bilateral proximal femora. Sclerotic appearance of many of these areas, for instance LEFT acetabulum and iliac posteriorly with increased sclerosis but otherwise similar appearance. Increased sclerosis of RIGHT pubic bone and increased number of sclerotic areas throughout the RIGHT iliac bone. New areas of sclerosis corresponding to areas of radiotracer accumulation throughout all areas of the spine with the exception of subtle sclerotic foci in the midthoracic spine the correspond areas of increased metabolic activity on the prior PET scan. Cervical spine with near diffuse sclerosis corresponding to areas of uptake also new. Nerve stimulator remaining in place, power pack over the LEFT hip. Signs of prior fracture of the LEFT pubic bone similar to previous imaging IMPRESSION: Marked interval worsening of bony metastases with near confluent involvement of lower cervical spine through the lumbar spine, widespread involvement of the remainder of the cervical spine, pelvis, ribs and calvarium. All visualized bones are involved. Despite decreased size of lymph nodes in the pelvis and retroperitoneum there is marked radiotracer  accumulation in these areas compatible with prostate cancer metastases and new lymph nodes in the chest and lower neck as described. Heterogeneity of the liver is not definitive for metastatic process but does raise the question, suggest attention on follow-up as appropriate. Soft tissue at the root of the mesentery is little changed dating back to 2017 likely reflecting evidence of treated lymphoma. Of note there is some cross reactivity with PSMA and lymphoma. The intense nature of radiotracer accumulation and the clinical findings of elevated PSA are noted and findings are therefore more compatible with prostate cancer metastases. Aortic  Atherosclerosis (ICD10-I70.0). Electronically Signed   By: Zetta Bills M.D.   On: 10/30/2021 11:32    ASSESSMENT & PLAN:   87 yo with   1) Metastatic prostate cancer with bone metastases and especially left hip pain.  Concern for progression on Lupron and bisphosphonates.  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 2) Bone metastases 3) h/o follicular lymphoma in 5573 rx with R-[facility]P 4) Mild macrocytic anemia - likely from metastatic prostate cancer.   PLAN: -Patient was seen today and notes he is been doing okay. -He notes he is tolerating his Xtandi at 160 mg p.o. daily with no acute toxicities. -His PSA level has come down from 2410 to 1046 --Continue Lupron q71month at this time. -Continue Aredia qmonthly to reduce risks of fractures or bone hardening. -Recommended pt take 5000 IU Vitamin D daily.  -Advised pt he can use OTC Voltaren gel for application over painful area in back. Recommended OTC 4% Lidocaine patch for local pain control. -He does have hydrocodone/Tylenol to use as needed for pain.    FOLLOW UP: Please schedule next 6 cycles of monthly Aredia infusions Please schedule next 4 doses of every 385-monthupron shots MD visit in 2 months with Aredia infusion on 01/09/2022  . The total time spent in the appointment was 20 minutes and more than 50% was on counseling and direct patient cares.   All of the patient's questions were answered with apparent satisfaction. The patient knows to call the clinic with any problems, questions or concerns.  GaSullivan LoneD MSGrangevilleAHIVMS SCDelta Medical CenterTNorthwest Endo Center LLCematology/Oncology Physician CoNorth Shore Medical Center

## 2021-11-28 ENCOUNTER — Other Ambulatory Visit: Payer: Self-pay | Admitting: Hematology

## 2021-11-28 DIAGNOSIS — C61 Malignant neoplasm of prostate: Secondary | ICD-10-CM

## 2021-11-28 DIAGNOSIS — C7951 Secondary malignant neoplasm of bone: Secondary | ICD-10-CM

## 2021-11-28 DIAGNOSIS — H5203 Hypermetropia, bilateral: Secondary | ICD-10-CM | POA: Diagnosis not present

## 2021-11-28 DIAGNOSIS — H04121 Dry eye syndrome of right lacrimal gland: Secondary | ICD-10-CM | POA: Diagnosis not present

## 2021-11-28 DIAGNOSIS — H401131 Primary open-angle glaucoma, bilateral, mild stage: Secondary | ICD-10-CM | POA: Diagnosis not present

## 2021-11-29 ENCOUNTER — Other Ambulatory Visit: Payer: Self-pay | Admitting: Hematology

## 2021-11-29 ENCOUNTER — Other Ambulatory Visit (HOSPITAL_COMMUNITY): Payer: Self-pay

## 2021-11-29 DIAGNOSIS — C61 Malignant neoplasm of prostate: Secondary | ICD-10-CM

## 2021-11-29 DIAGNOSIS — C7951 Secondary malignant neoplasm of bone: Secondary | ICD-10-CM

## 2021-11-30 ENCOUNTER — Other Ambulatory Visit (HOSPITAL_COMMUNITY): Payer: Self-pay

## 2021-12-02 ENCOUNTER — Other Ambulatory Visit (HOSPITAL_COMMUNITY): Payer: Self-pay

## 2021-12-02 ENCOUNTER — Encounter: Payer: Self-pay | Admitting: Hematology

## 2021-12-02 MED ORDER — HYDROCODONE-ACETAMINOPHEN 5-325 MG PO TABS: 1.0000 | ORAL_TABLET | Freq: Four times a day (QID) | ORAL | 0 refills | Status: DC | PRN

## 2021-12-11 ENCOUNTER — Other Ambulatory Visit: Payer: Self-pay

## 2021-12-11 DIAGNOSIS — C61 Malignant neoplasm of prostate: Secondary | ICD-10-CM

## 2021-12-11 DIAGNOSIS — C7951 Secondary malignant neoplasm of bone: Secondary | ICD-10-CM

## 2021-12-12 ENCOUNTER — Inpatient Hospital Stay: Payer: Medicare PPO

## 2021-12-12 ENCOUNTER — Inpatient Hospital Stay: Payer: Medicare PPO | Attending: Hematology

## 2021-12-12 ENCOUNTER — Other Ambulatory Visit: Payer: Self-pay

## 2021-12-12 VITALS — BP 136/65 | HR 50 | Temp 97.7°F | Resp 16 | Ht 70.0 in | Wt 169.8 lb

## 2021-12-12 DIAGNOSIS — C61 Malignant neoplasm of prostate: Secondary | ICD-10-CM

## 2021-12-12 DIAGNOSIS — C7951 Secondary malignant neoplasm of bone: Secondary | ICD-10-CM | POA: Diagnosis not present

## 2021-12-12 LAB — CBC WITH DIFFERENTIAL (CANCER CENTER ONLY)
Abs Immature Granulocytes: 0.01 10*3/uL (ref 0.00–0.07)
Basophils Absolute: 0 10*3/uL (ref 0.0–0.1)
Basophils Relative: 1 %
Eosinophils Absolute: 0.1 10*3/uL (ref 0.0–0.5)
Eosinophils Relative: 2 %
HCT: 29.3 % — ABNORMAL LOW (ref 39.0–52.0)
Hemoglobin: 9.8 g/dL — ABNORMAL LOW (ref 13.0–17.0)
Immature Granulocytes: 0 %
Lymphocytes Relative: 15 %
Lymphs Abs: 0.6 10*3/uL — ABNORMAL LOW (ref 0.7–4.0)
MCH: 32.6 pg (ref 26.0–34.0)
MCHC: 33.4 g/dL (ref 30.0–36.0)
MCV: 97.3 fL (ref 80.0–100.0)
Monocytes Absolute: 0.4 10*3/uL (ref 0.1–1.0)
Monocytes Relative: 10 %
Neutro Abs: 3 10*3/uL (ref 1.7–7.7)
Neutrophils Relative %: 72 %
Platelet Count: 161 10*3/uL (ref 150–400)
RBC: 3.01 MIL/uL — ABNORMAL LOW (ref 4.22–5.81)
RDW: 15.8 % — ABNORMAL HIGH (ref 11.5–15.5)
WBC Count: 4.2 10*3/uL (ref 4.0–10.5)
nRBC: 0 % (ref 0.0–0.2)

## 2021-12-12 LAB — CMP (CANCER CENTER ONLY)
ALT: <5 U/L (ref 0–44)
AST: 13 U/L — ABNORMAL LOW (ref 15–41)
Albumin: 3.8 g/dL (ref 3.5–5.0)
Alkaline Phosphatase: 219 U/L — ABNORMAL HIGH (ref 38–126)
Anion gap: 11 (ref 5–15)
BUN: 38 mg/dL — ABNORMAL HIGH (ref 8–23)
CO2: 25 mmol/L (ref 22–32)
Calcium: 8.9 mg/dL (ref 8.9–10.3)
Chloride: 105 mmol/L (ref 98–111)
Creatinine: 1.24 mg/dL (ref 0.61–1.24)
GFR, Estimated: 54 mL/min — ABNORMAL LOW (ref >60)
Glucose, Bld: 89 mg/dL (ref 70–99)
Potassium: 4.5 mmol/L (ref 3.5–5.1)
Sodium: 141 mmol/L (ref 135–145)
Total Bilirubin: 0.6 mg/dL (ref 0.3–1.2)
Total Protein: 6.2 g/dL — ABNORMAL LOW (ref 6.5–8.1)

## 2021-12-12 MED ORDER — SODIUM CHLORIDE 0.9 % IV SOLN: Freq: Once | INTRAVENOUS | Status: AC

## 2021-12-12 MED ORDER — SODIUM CHLORIDE 0.9 % IV SOLN
60.0000 mg | Freq: Once | INTRAVENOUS | Status: AC
  Administered 2021-12-12: 60 mg via INTRAVENOUS
  Filled 2021-12-12: qty 10

## 2021-12-12 NOTE — Patient Instructions (Signed)
Pamidronate Injection ?What is this medication? ?PAMIDRONATE (pa mi DROE nate) slows calcium loss from bones. It treats Paget's disease and high calcium levels in the blood from some kinds of cancer. It may be used in other people at risk for bone loss. ?This medicine may be used for other purposes; ask your health care provider or pharmacist if you have questions. ?COMMON BRAND NAME(S): Aredia ?What should I tell my care team before I take this medication? ?They need to know if you have any of these conditions: ?bleeding disorder ?cancer ?dental disease ?kidney disease ?low levels of calcium or other minerals in the blood ?low red blood cell counts ?receiving steroids like dexamethasone or prednisone ?an unusual or allergic reaction to pamidronate, other drugs, foods, dyes or preservatives ?pregnant or trying to get pregnant ?breast-feeding ?How should I use this medication? ?This drug is injected into a vein. It is given by a health care provider in a hospital or clinic setting. ?Talk to your health care provider about the use of this drug in children. Special care may be needed. ?Overdosage: If you think you have taken too much of this medicine contact a poison control center or emergency room at once. ?NOTE: This medicine is only for you. Do not share this medicine with others. ?What if I miss a dose? ?Keep appointments for follow-up doses. It is important not to miss your dose. Call your health care provider if you are unable to keep an appointment. ?What may interact with this medication? ?certain antibiotics given by injection ?medicines for inflammation or pain like ibuprofen, naproxen ?some diuretics like bumetanide, furosemide ?cyclosporine ?parathyroid hormone ?tacrolimus ?teriparatide ?thalidomide ?This list may not describe all possible interactions. Give your health care provider a list of all the medicines, herbs, non-prescription drugs, or dietary supplements you use. Also tell them if you smoke,  drink alcohol, or use illegal drugs. Some items may interact with your medicine. ?What should I watch for while using this medication? ?Visit your health care provider for regular checks on your progress. It may be some time before you see the benefit from this drug. ?Some people who take this drug have severe bone, joint, or muscle pain. This drug may also increase your risk for jaw problems or a broken thigh bone. Tell your health care provider right away if you have severe pain in your jaw, bones, joints, or muscles. Tell you health care provider if you have any pain that does not go away or that gets worse. ?Tell your dentist and dental surgeon that you are taking this drug. You should not have major dental surgery while on this drug. See your dentist to have a dental exam and fix any dental problems before starting this drug. Take good care of your teeth while on this drug. Make sure you see your dentist for regular follow-up appointments. ?You should make sure you get enough calcium and vitamin D while you are taking this drug. Discuss the foods you eat and the vitamins you take with your health care provider. ?You may need blood work done while you are taking this drug. ?Do not become pregnant while taking this drug. Women should inform their health care provider if they wish to become pregnant or think they might be pregnant. There is potential for serious harm to an unborn child. Talk to your health care provider for more information. ?What side effects may I notice from receiving this medication? ?Side effects that you should report to your doctor or health care   provider as soon as possible: ?allergic reactions (skin rash, itching or hives; swelling of the face, lips, or tongue) ?bleeding (bloody or black, tarry stools; red or dark brown urine; spitting up blood or brown material that looks like coffee grounds; red spots on the skin; unusual bruising or bleeding from the eyes, gums, or nose) ?bone  pain ?increased thirst ?infection (fever, chills, cough, sore throat, pain or trouble passing urine) ?jaw pain, especially after dental work ?joint pain ?kidney injury (trouble passing urine or change in the amount of urine) ?low calcium levels (fast heartbeat; muscle cramps or pain; pain, tingling, or numbness in the hands or feet; seizures) ?low magnesium levels (fast, irregular heartbeat; muscle cramp or pain; muscle weakness; tremors; seizures) ?low potassium levels (trouble breathing; chest pain; dizziness; fast, irregular heartbeat; feeling faint or lightheaded, falls; muscle cramps or pain) ?muscle pain ?pain, redness, or irritation at site where injected ?redness, blistering, peeling, or loosening of the skin, including inside the mouth ?severe diarrhea ?unusual sweating ?Side effects that usually do not require medical attention (report to your doctor or health care provider if they continue or are bothersome): ?constipation ?eye irritation, itching, or pain ?fever ?headache ?increase in blood pressure ?loss of appetite ?nausea ?stomach pain ?unusually weak or tired ?vomiting ?This list may not describe all possible side effects. Call your doctor for medical advice about side effects. You may report side effects to FDA at 1-800-FDA-1088. ?Where should I keep my medication? ?This drug is given in a hospital or clinic. It will not be stored at home. ?NOTE: This sheet is a summary. It may not cover all possible information. If you have questions about this medicine, talk to your doctor, pharmacist, or health care provider. ?? 2022 Elsevier/Gold Standard (2021-09-03 00:00:00) ? ?

## 2021-12-13 LAB — PSA, TOTAL AND FREE
PSA, Free Pct: >14.1 %
PSA, Free: >50 ng/mL
Prostate Specific Ag, Serum: 355 ng/mL — ABNORMAL HIGH (ref 0.0–4.0)

## 2021-12-19 ENCOUNTER — Other Ambulatory Visit: Payer: Medicare PPO

## 2021-12-19 ENCOUNTER — Ambulatory Visit: Payer: Medicare PPO

## 2021-12-27 ENCOUNTER — Other Ambulatory Visit (HOSPITAL_COMMUNITY): Payer: Self-pay

## 2021-12-29 DIAGNOSIS — R55 Syncope and collapse: Secondary | ICD-10-CM

## 2021-12-29 HISTORY — PX: DG PORTABLE CHEST X RAY (ARMC HX): HXRAD1591

## 2021-12-29 HISTORY — DX: Syncope and collapse: R55

## 2022-01-02 ENCOUNTER — Ambulatory Visit: Payer: Medicare PPO

## 2022-01-03 ENCOUNTER — Other Ambulatory Visit: Payer: Self-pay | Admitting: Hematology

## 2022-01-03 ENCOUNTER — Other Ambulatory Visit (HOSPITAL_COMMUNITY): Payer: Self-pay

## 2022-01-03 DIAGNOSIS — C7951 Secondary malignant neoplasm of bone: Secondary | ICD-10-CM

## 2022-01-03 DIAGNOSIS — C61 Malignant neoplasm of prostate: Secondary | ICD-10-CM

## 2022-01-05 ENCOUNTER — Encounter: Payer: Self-pay | Admitting: Hematology

## 2022-01-05 MED ORDER — HYDROCODONE-ACETAMINOPHEN 5-325 MG PO TABS: 1.0000 | ORAL_TABLET | Freq: Four times a day (QID) | ORAL | 0 refills | Status: DC | PRN

## 2022-01-08 ENCOUNTER — Other Ambulatory Visit: Payer: Self-pay

## 2022-01-08 DIAGNOSIS — C61 Malignant neoplasm of prostate: Secondary | ICD-10-CM

## 2022-01-09 ENCOUNTER — Inpatient Hospital Stay: Payer: Medicare PPO | Admitting: Hematology

## 2022-01-09 ENCOUNTER — Other Ambulatory Visit: Payer: Medicare PPO

## 2022-01-09 ENCOUNTER — Inpatient Hospital Stay: Payer: Medicare PPO | Attending: Hematology

## 2022-01-09 ENCOUNTER — Other Ambulatory Visit: Payer: Self-pay

## 2022-01-09 ENCOUNTER — Ambulatory Visit: Payer: Medicare PPO

## 2022-01-09 ENCOUNTER — Inpatient Hospital Stay: Payer: Medicare PPO

## 2022-01-09 VITALS — BP 153/50 | HR 52 | Temp 97.7°F | Resp 18 | Wt 178.3 lb

## 2022-01-09 VITALS — BP 145/42 | HR 50 | Temp 98.3°F | Resp 16

## 2022-01-09 DIAGNOSIS — C7951 Secondary malignant neoplasm of bone: Secondary | ICD-10-CM | POA: Insufficient documentation

## 2022-01-09 DIAGNOSIS — C61 Malignant neoplasm of prostate: Secondary | ICD-10-CM

## 2022-01-09 DIAGNOSIS — D539 Nutritional anemia, unspecified: Secondary | ICD-10-CM | POA: Insufficient documentation

## 2022-01-09 LAB — CBC WITH DIFFERENTIAL (CANCER CENTER ONLY)
Abs Immature Granulocytes: 0.01 10*3/uL (ref 0.00–0.07)
Basophils Absolute: 0 10*3/uL (ref 0.0–0.1)
Basophils Relative: 1 %
Eosinophils Absolute: 0.2 10*3/uL (ref 0.0–0.5)
Eosinophils Relative: 4 %
HCT: 30.1 % — ABNORMAL LOW (ref 39.0–52.0)
Hemoglobin: 9.8 g/dL — ABNORMAL LOW (ref 13.0–17.0)
Immature Granulocytes: 0 %
Lymphocytes Relative: 17 %
Lymphs Abs: 0.7 10*3/uL (ref 0.7–4.0)
MCH: 32.7 pg (ref 26.0–34.0)
MCHC: 32.6 g/dL (ref 30.0–36.0)
MCV: 100.3 fL — ABNORMAL HIGH (ref 80.0–100.0)
Monocytes Absolute: 0.4 10*3/uL (ref 0.1–1.0)
Monocytes Relative: 11 %
Neutro Abs: 2.7 10*3/uL (ref 1.7–7.7)
Neutrophils Relative %: 67 %
Platelet Count: 173 10*3/uL (ref 150–400)
RBC: 3 MIL/uL — ABNORMAL LOW (ref 4.22–5.81)
RDW: 15 % (ref 11.5–15.5)
WBC Count: 3.9 10*3/uL — ABNORMAL LOW (ref 4.0–10.5)
nRBC: 0 % (ref 0.0–0.2)

## 2022-01-09 LAB — CMP (CANCER CENTER ONLY)
ALT: 6 U/L (ref 0–44)
AST: 12 U/L — ABNORMAL LOW (ref 15–41)
Albumin: 4.2 g/dL (ref 3.5–5.0)
Alkaline Phosphatase: 179 U/L — ABNORMAL HIGH (ref 38–126)
Anion gap: 8 (ref 5–15)
BUN: 42 mg/dL — ABNORMAL HIGH (ref 8–23)
CO2: 31 mmol/L (ref 22–32)
Calcium: 9.8 mg/dL (ref 8.9–10.3)
Chloride: 102 mmol/L (ref 98–111)
Creatinine: 1.32 mg/dL — ABNORMAL HIGH (ref 0.61–1.24)
GFR, Estimated: 50 mL/min — ABNORMAL LOW (ref >60)
Glucose, Bld: 93 mg/dL (ref 70–99)
Potassium: 4.2 mmol/L (ref 3.5–5.1)
Sodium: 141 mmol/L (ref 135–145)
Total Bilirubin: 0.4 mg/dL (ref 0.3–1.2)
Total Protein: 6.5 g/dL (ref 6.5–8.1)

## 2022-01-09 MED ORDER — SODIUM CHLORIDE 0.9 % IV SOLN
60.0000 mg | Freq: Once | INTRAVENOUS | Status: AC
  Administered 2022-01-09: 60 mg via INTRAVENOUS
  Filled 2022-01-09: qty 10

## 2022-01-09 MED ORDER — SODIUM CHLORIDE 0.9 % IV SOLN: Freq: Once | INTRAVENOUS | Status: AC

## 2022-01-09 NOTE — Patient Instructions (Signed)
Pamidronate Injection ?What is this medication? ?PAMIDRONATE (pa mi DROE nate) slows calcium loss from bones. It treats Paget's disease and high calcium levels in the blood from some kinds of cancer. It may be used in other people at risk for bone loss. ?This medicine may be used for other purposes; ask your health care provider or pharmacist if you have questions. ?COMMON BRAND NAME(S): Aredia ?What should I tell my care team before I take this medication? ?They need to know if you have any of these conditions: ?bleeding disorder ?cancer ?dental disease ?kidney disease ?low levels of calcium or other minerals in the blood ?low red blood cell counts ?receiving steroids like dexamethasone or prednisone ?an unusual or allergic reaction to pamidronate, other drugs, foods, dyes or preservatives ?pregnant or trying to get pregnant ?breast-feeding ?How should I use this medication? ?This drug is injected into a vein. It is given by a health care provider in a hospital or clinic setting. ?Talk to your health care provider about the use of this drug in children. Special care may be needed. ?Overdosage: If you think you have taken too much of this medicine contact a poison control center or emergency room at once. ?NOTE: This medicine is only for you. Do not share this medicine with others. ?What if I miss a dose? ?Keep appointments for follow-up doses. It is important not to miss your dose. Call your health care provider if you are unable to keep an appointment. ?What may interact with this medication? ?certain antibiotics given by injection ?medicines for inflammation or pain like ibuprofen, naproxen ?some diuretics like bumetanide, furosemide ?cyclosporine ?parathyroid hormone ?tacrolimus ?teriparatide ?thalidomide ?This list may not describe all possible interactions. Give your health care provider a list of all the medicines, herbs, non-prescription drugs, or dietary supplements you use. Also tell them if you smoke,  drink alcohol, or use illegal drugs. Some items may interact with your medicine. ?What should I watch for while using this medication? ?Visit your health care provider for regular checks on your progress. It may be some time before you see the benefit from this drug. ?Some people who take this drug have severe bone, joint, or muscle pain. This drug may also increase your risk for jaw problems or a broken thigh bone. Tell your health care provider right away if you have severe pain in your jaw, bones, joints, or muscles. Tell you health care provider if you have any pain that does not go away or that gets worse. ?Tell your dentist and dental surgeon that you are taking this drug. You should not have major dental surgery while on this drug. See your dentist to have a dental exam and fix any dental problems before starting this drug. Take good care of your teeth while on this drug. Make sure you see your dentist for regular follow-up appointments. ?You should make sure you get enough calcium and vitamin D while you are taking this drug. Discuss the foods you eat and the vitamins you take with your health care provider. ?You may need blood work done while you are taking this drug. ?Do not become pregnant while taking this drug. Women should inform their health care provider if they wish to become pregnant or think they might be pregnant. There is potential for serious harm to an unborn child. Talk to your health care provider for more information. ?What side effects may I notice from receiving this medication? ?Side effects that you should report to your doctor or health care   provider as soon as possible: ?allergic reactions (skin rash, itching or hives; swelling of the face, lips, or tongue) ?bleeding (bloody or black, tarry stools; red or dark brown urine; spitting up blood or brown material that looks like coffee grounds; red spots on the skin; unusual bruising or bleeding from the eyes, gums, or nose) ?bone  pain ?increased thirst ?infection (fever, chills, cough, sore throat, pain or trouble passing urine) ?jaw pain, especially after dental work ?joint pain ?kidney injury (trouble passing urine or change in the amount of urine) ?low calcium levels (fast heartbeat; muscle cramps or pain; pain, tingling, or numbness in the hands or feet; seizures) ?low magnesium levels (fast, irregular heartbeat; muscle cramp or pain; muscle weakness; tremors; seizures) ?low potassium levels (trouble breathing; chest pain; dizziness; fast, irregular heartbeat; feeling faint or lightheaded, falls; muscle cramps or pain) ?muscle pain ?pain, redness, or irritation at site where injected ?redness, blistering, peeling, or loosening of the skin, including inside the mouth ?severe diarrhea ?unusual sweating ?Side effects that usually do not require medical attention (report to your doctor or health care provider if they continue or are bothersome): ?constipation ?eye irritation, itching, or pain ?fever ?headache ?increase in blood pressure ?loss of appetite ?nausea ?stomach pain ?unusually weak or tired ?vomiting ?This list may not describe all possible side effects. Call your doctor for medical advice about side effects. You may report side effects to FDA at 1-800-FDA-1088. ?Where should I keep my medication? ?This drug is given in a hospital or clinic. It will not be stored at home. ?NOTE: This sheet is a summary. It may not cover all possible information. If you have questions about this medicine, talk to your doctor, pharmacist, or health care provider. ?? 2022 Elsevier/Gold Standard (2021-09-03 00:00:00) ? ?

## 2022-01-09 NOTE — Progress Notes (Signed)
Pt. went to restroom and when he returned he had a small skin tear noted to right forearm. Measures 2 cm x 1 cm site cleaned with normal saline and 2 x 2 Tegaderm dressing applied. Instructed pt. to leave Tegaderm on and he will have staff at his living facility look at it as well. Notified son of skin tear when he picked him up.

## 2022-01-10 ENCOUNTER — Encounter: Payer: Self-pay | Admitting: Hematology

## 2022-01-10 LAB — PSA, TOTAL AND FREE
PSA, Free Pct: 24.4 %
PSA, Free: 43.7 ng/mL
Prostate Specific Ag, Serum: 179 ng/mL — ABNORMAL HIGH (ref 0.0–4.0)

## 2022-01-15 ENCOUNTER — Encounter: Payer: Self-pay | Admitting: Hematology

## 2022-01-15 NOTE — Progress Notes (Addendum)
HEMATOLOGY/ONCOLOGY CLINIC NOTE  Date of Service: .01/09/2022   Patient Care Team: [name], MD as PCP - General (Family Medicine) [name], MD as PCP - Cardiology (Cardiology) [name], MD as PCP - Electrophysiology (Cardiology)  CHIEF COMPLAINTS/PURPOSE OF CONSULTATION:  Follow-up for metastatic prostate cancer with bone mets  HISTORY OF PRESENTING ILLNESS:  Please see previous note for details on initial presentation  INTERVAL HISTORY  Jacob Warner is here for follow-up of his metastatic prostate cancer.  He notes that he has been doing fairly well and is not having much back pain or hip pain or other focal pain. He notes he has been eating better. No lower extremity weakness or new bladder or bowel issues. No reported toxicities from his Xtandi 160 mg p.o. daily at this time. Problems tolerating his Lupron and Aredia. No new dental issues.  Labs today show CBC with stable hemoglobin of 9.8, WBC count of 3.9k and platelets of 173k. CMP stable with creatinine of 1.32 alkaline phosphatase improved and is down to 179 otherwise unremarkable. PSA level is down to 179 from a peak of 2410.  MEDICAL HISTORY:  Past Medical History:  Diagnosis Date   A-fib ([place])    Alopecia    Anemia    Bradycardia    Cancer ([place])    Cherry angioma    Echocardiogram    Echo 11/19: Mild concentric LVH, EF 50-55, normal wall motion, grade 1 diastolic dysfunction, mild AI, MAC, mild MR, normal RVSF, moderate TR, PASP 36   GERD (gastroesophageal reflux disease)    Glaucoma    Hypertension    Nuclear stress test    Nuclear stress test 11/19: not gated, inf and apical defect c/w small scar and/or soft tissue attenuation   Pelvic fracture ([facility])    Right BBB/left ant fasc block    Sick sinus syndrome ([place])    TIA (transient ischemic attack)    Xeroderma     SURGICAL HISTORY: Past Surgical History:  Procedure Laterality Date   APPENDECTOMY     CERVICAL SPINE SURGERY      EXPLORATORY LAPAROTOMY     LITHOTRIPSY     PACEMAKER PLACEMENT     PPM GENERATOR CHANGEOUT N/A 05/10/2020   Procedure: PPM GENERATOR CHANGEOUT;  Surgeon: [name], MD;  Location: [facility];  Service: Cardiovascular;  Laterality: N/A;   SPINAL CORD STIMULATOR IMPLANT Bilateral 2018   [name], MD PhD, [facility]    SOCIAL HISTORY: Social History   Socioeconomic History   Marital status: Married    Spouse name: Not on file   Number of children: Not on file   Years of education: Not on file   Highest education level: Not on file  Occupational History   Not on file  Tobacco Use   Smoking status: Never   Smokeless tobacco: Never  Vaping Use   Vaping Use: Never used  Substance and Sexual Activity   Alcohol use: No    Alcohol/week: 0.0 standard drinks   Drug use: No   Sexual activity: Not on file  Other Topics Concern   Not on file  Social History Narrative   Not on file   Social Determinants of Health   Financial Resource Strain: Not on file  Food Insecurity: Not on file  Transportation Needs: Not on file  Physical Activity: Not on file  Stress: Not on file  Social Connections: Not on file  Intimate Partner Violence: Not  on file    FAMILY HISTORY: Family History  Problem Relation Age of Onset   Brain cancer Sister     ALLERGIES:  is allergic to sulfamethoxazole-trimethoprim, sulfa antibiotics, and sulfacetamide sodium.  MEDICATIONS:  Current Outpatient Medications  Medication Sig Dispense Refill   acetaminophen (TYLENOL) 500 MG tablet Take 500-1,000 mg by mouth See admin instructions. 500 mg twice daily, 1000 mg at bedtime     azelastine (ASTELIN) 0.1 % nasal spray Place 1 spray into both nostrils 2 (two) times daily as needed for rhinitis.  (Patient not taking: Reported on 10/10/2021)     calcium carbonate (TUMS - DOSED IN MG ELEMENTAL CALCIUM) 500 MG chewable tablet 1 tablet     denosumab (PROLIA) 60 MG/ML  SOSY injection Inject 60 mg into the skin every 6 (six) months. (Patient not taking: Reported on 10/10/2021)     dutasteride (AVODART) 0.5 MG capsule Take 0.5 mg by mouth daily.     enzalutamide (XTANDI) 80 MG tablet Take 2 tablets (160 mg total) by mouth daily. 60 tablet 2   furosemide (LASIX) 40 MG tablet Take 40 mg by mouth daily.     gabapentin (NEURONTIN) 300 MG capsule Take 1 capsule (300 mg total) by mouth at bedtime. 90 capsule 1   HYDROcodone-acetaminophen (NORCO/VICODIN) 5-325 MG tablet Take 1 tablet by mouth every 6 (six) hours as needed for moderate pain or severe pain. 60 tablet 0   hydroxypropyl methylcellulose / hypromellose (ISOPTO TEARS / GONIOVISC) 2.5 % ophthalmic solution Place 1 drop into both eyes as needed for dry eyes.     ibuprofen (ADVIL) 200 MG tablet 1 tablet with food or milk as needed     ketoconazole (NIZORAL) 2 % shampoo Apply 1 application topically 2 (two) times a week.      lactulose (CHRONULAC) 10 GM/15ML solution Take 10 g by mouth daily. (Patient not taking: Reported on 10/10/2021)  0   latanoprost (XALATAN) 0.005 % ophthalmic solution Place 1 drop into both eyes at bedtime.  (Patient not taking: Reported on 10/10/2021)  11   magnesium oxide (MAG-OX) 400 MG tablet 1 tablet     Magnesium Oxide 400 (240 Mg) MG TABS Take 1 tablet by mouth 2 (two) times daily. (Patient not taking: Reported on 10/10/2021)  7   meloxicam (MOBIC) 15 MG tablet Take 15 mg by mouth daily. (Patient not taking: Reported on 10/10/2021)     montelukast (SINGULAIR) 10 MG tablet 1 tablet (Patient not taking: Reported on 10/10/2021)     nitroGLYCERIN (NITROSTAT) 0.4 MG SL tablet Place 1 tablet (0.4 mg total) under the tongue every 5 (five) minutes as needed for chest pain. 25 tablet 5   pantoprazole (PROTONIX) 40 MG tablet Take 1 tablet by mouth daily. (Patient not taking: Reported on 10/10/2021)     RESTASIS 0.05 % ophthalmic emulsion Place 1 drop into both eyes daily.   11   ROCKLATAN  0.02-0.005 % SOLN      topiramate (TOPAMAX) 25 MG tablet  (Patient not taking: Reported on 10/10/2021)     traZODone (DESYREL) 50 MG tablet Take 50 mg by mouth at bedtime.   11   vitamin B-12 (CYANOCOBALAMIN) 1000 MCG tablet Take 1,000 mcg by mouth daily.     White Petrolatum-Mineral Oil (EYE LUBRICANT) OINT Apply 1 strip to eye at bedtime.     No current facility-administered medications for this visit.    REVIEW OF SYSTEMS:   10 Point review of Systems was done is negative except as  noted above.  PHYSICAL EXAMINATION: .BP (!) 153/50    Pulse (!) 52    Temp 97.7 F (36.5 C)    Resp 18    Wt 178 lb 4.8 oz (80.9 kg)    SpO2 99%    BMI 25.58 kg/m  . GENERAL:alert, in no acute distress and comfortable SKIN: no acute rashes, no significant lesions EYES: conjunctiva are pink and non-injected, sclera anicteric OROPHARYNX: MMM, no exudates, no oropharyngeal erythema or ulceration NECK: supple, no JVD LYMPH:  no palpable lymphadenopathy in the cervical, axillary or inguinal regions LUNGS: clear to auscultation b/l with normal respiratory effort HEART: regular rate & rhythm ABDOMEN:  normoactive bowel sounds , non tender, not distended. Extremity: no pedal edema PSYCH: alert & oriented x 3 with fluent speech NEURO: no focal motor/sensory deficits  LABORATORY DATA:  I have reviewed the data as listed  . CBC Latest Ref Rng & Units 01/09/2022 12/12/2021 11/14/2021  WBC 4.0 - 10.5 K/uL 3.9(L) 4.2 5.5  Hemoglobin 13.0 - 17.0 g/dL 9.8(L) 9.8(L) 9.9(L)  Hematocrit 39.0 - 52.0 % 30.1(L) 29.3(L) 30.7(L)  Platelets 150 - 400 K/uL 173 161 184   . CBC    Component Value Date/Time   WBC 3.9 (L) 01/09/2022 0857   WBC 5.5 11/14/2021 1055   RBC 3.00 (L) 01/09/2022 0857   HGB 9.8 (L) 01/09/2022 0857   HGB 11.4 (L) 04/20/2020 1106   HGB 12.2 (L) 07/08/2017 1336   HCT 30.1 (L) 01/09/2022 0857   HCT 34.1 (L) 09/17/2020 1205   HCT 35.6 (L) 07/08/2017 1336   PLT 173 01/09/2022 0857   PLT 221  04/20/2020 1106   MCV 100.3 (H) 01/09/2022 0857   MCV 97 04/20/2020 1106   MCV 96.2 07/08/2017 1336   MCH 32.7 01/09/2022 0857   MCHC 32.6 01/09/2022 0857   RDW 15.0 01/09/2022 0857   RDW 13.2 04/20/2020 1106   RDW 13.9 07/08/2017 1336   LYMPHSABS 0.7 01/09/2022 0857   LYMPHSABS 1.3 04/20/2020 1106   LYMPHSABS 0.8 (L) 07/08/2017 1336   MONOABS 0.4 01/09/2022 0857   MONOABS 0.5 07/08/2017 1336   EOSABS 0.2 01/09/2022 0857   EOSABS 0.2 04/20/2020 1106   BASOSABS 0.0 01/09/2022 0857   BASOSABS 0.0 04/20/2020 1106   BASOSABS 0.0 07/08/2017 1336    . CMP Latest Ref Rng & Units 01/09/2022 12/12/2021 11/14/2021  Glucose 70 - 99 mg/dL 93 89 85  BUN 8 - 23 mg/dL 42(H) 38(H) 32(H)  Creatinine 0.61 - 1.24 mg/dL 1.32(H) 1.24 1.21  Sodium 135 - 145 mmol/L 141 141 137  Potassium 3.5 - 5.1 mmol/L 4.2 4.5 4.6  Chloride 98 - 111 mmol/L 102 105 98  CO2 22 - 32 mmol/L _0 Calcium 8.9 - 10.3 mg/dL 9.8 8.9 9.4  Total Protein 6.5 - 8.1 g/dL 6.5 6.2(L) 6.5  Total Bilirubin 0.3 - 1.2 mg/dL 0.4 0.6 0.5  Alkaline Phos 38 - 126 U/L 179(H) 219(H) 275(H)  AST 15 - 41 U/L 12(L) 13(L) 13(L)  ALT 0 - 44 U/L 6 <5 6     RADIOGRAPHIC STUDIES: I have personally reviewed the radiological images as listed and agreed with the findings in the report. No results found.  ASSESSMENT & PLAN:   88 yo with   1) Metastatic prostate cancer with bone metastases and especially left hip pain.  Concern for progression on Lupron and bisphosphonates.  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 2) Bone metastases 3) h/o follicular lymphoma in 4967 rx with [facility] 4)  Mild macrocytic anemia - likely from metastatic prostate cancer.   PLAN: Labs today show CBC with stable hemoglobin of 9.8, WBC count of 3.9k and platelets of 173k. CMP stable with creatinine of 1.32 alkaline phosphatase improved and is down to 179 otherwise unremarkable. PSA level is down to 179 from a peak of 2410. -Patient notes no other acute new focal  symptoms. -He is glad that he his PSA levels are coming down and that his back pain and hip pains are better controlled. -Reported toxicity with Xtandi 160 mg p.o. daily and we shall continue this dose. -Renal Lupron every 3 months -Continue Aredia monthly -Continue vitamin daily 5000 units daily -As needed hydrocodone/acetaminophen for uncontrolled pain.  FOLLOW UP: Please schedule next 6 cycles of monthly Aredia infusions Please schedule next 4 doses of every 91-monthLupron shots MD visit in 2 months with Aredia infusion    All of the patient's questions were answered with apparent satisfaction. The patient knows to call the clinic with any problems, questions or concerns.  GSullivan LoneMD MS AAHIVMS SArizona Spine & Joint HospitalCLawrence Memorial HospitalHematology/Oncology Physician CFort Lauderdale [facility]..Marland Kitchen

## 2022-01-28 ENCOUNTER — Other Ambulatory Visit (HOSPITAL_COMMUNITY): Payer: Self-pay

## 2022-01-30 ENCOUNTER — Other Ambulatory Visit: Payer: Self-pay | Admitting: Hematology

## 2022-01-30 ENCOUNTER — Other Ambulatory Visit (HOSPITAL_COMMUNITY): Payer: Self-pay

## 2022-01-30 DIAGNOSIS — C7951 Secondary malignant neoplasm of bone: Secondary | ICD-10-CM

## 2022-01-30 DIAGNOSIS — C61 Malignant neoplasm of prostate: Secondary | ICD-10-CM

## 2022-01-30 MED ORDER — ENZALUTAMIDE 80 MG PO TABS
160.0000 mg | ORAL_TABLET | Freq: Every day | ORAL | 2 refills | Status: DC
  Filled 2022-01-30: qty 60, 30d supply, fill #0
  Filled 2022-03-04: qty 60, 30d supply, fill #1
  Filled 2022-03-25: qty 60, 30d supply, fill #2

## 2022-02-04 DIAGNOSIS — H04123 Dry eye syndrome of bilateral lacrimal glands: Secondary | ICD-10-CM | POA: Diagnosis not present

## 2022-02-04 DIAGNOSIS — H401131 Primary open-angle glaucoma, bilateral, mild stage: Secondary | ICD-10-CM | POA: Diagnosis not present

## 2022-02-05 ENCOUNTER — Other Ambulatory Visit: Payer: Self-pay

## 2022-02-05 DIAGNOSIS — C61 Malignant neoplasm of prostate: Secondary | ICD-10-CM

## 2022-02-06 ENCOUNTER — Inpatient Hospital Stay: Payer: Medicare PPO | Attending: Hematology

## 2022-02-06 ENCOUNTER — Ambulatory Visit: Payer: Medicare PPO

## 2022-02-06 ENCOUNTER — Inpatient Hospital Stay: Payer: Medicare PPO

## 2022-02-06 ENCOUNTER — Other Ambulatory Visit: Payer: Self-pay

## 2022-02-06 ENCOUNTER — Other Ambulatory Visit: Payer: Self-pay | Admitting: Hematology

## 2022-02-06 VITALS — BP 151/61 | HR 58 | Temp 97.7°F | Resp 16

## 2022-02-06 DIAGNOSIS — Z5111 Encounter for antineoplastic chemotherapy: Secondary | ICD-10-CM | POA: Diagnosis not present

## 2022-02-06 DIAGNOSIS — C7951 Secondary malignant neoplasm of bone: Secondary | ICD-10-CM | POA: Insufficient documentation

## 2022-02-06 DIAGNOSIS — C61 Malignant neoplasm of prostate: Secondary | ICD-10-CM | POA: Diagnosis not present

## 2022-02-06 LAB — CMP (CANCER CENTER ONLY)
ALT: 5 U/L (ref 0–44)
AST: 11 U/L — ABNORMAL LOW (ref 15–41)
Albumin: 4.4 g/dL (ref 3.5–5.0)
Alkaline Phosphatase: 124 U/L (ref 38–126)
Anion gap: 9 (ref 5–15)
BUN: 31 mg/dL — ABNORMAL HIGH (ref 8–23)
CO2: 26 mmol/L (ref 22–32)
Calcium: 9.6 mg/dL (ref 8.9–10.3)
Chloride: 103 mmol/L (ref 98–111)
Creatinine: 1.36 mg/dL — ABNORMAL HIGH (ref 0.61–1.24)
GFR, Estimated: 48 mL/min — ABNORMAL LOW (ref >60)
Glucose, Bld: 101 mg/dL — ABNORMAL HIGH (ref 70–99)
Potassium: 4.4 mmol/L (ref 3.5–5.1)
Sodium: 138 mmol/L (ref 135–145)
Total Bilirubin: 0.5 mg/dL (ref 0.3–1.2)
Total Protein: 6.5 g/dL (ref 6.5–8.1)

## 2022-02-06 LAB — CBC WITH DIFFERENTIAL (CANCER CENTER ONLY)
Abs Immature Granulocytes: 0.01 10*3/uL (ref 0.00–0.07)
Basophils Absolute: 0 10*3/uL (ref 0.0–0.1)
Basophils Relative: 1 %
Eosinophils Absolute: 0.1 10*3/uL (ref 0.0–0.5)
Eosinophils Relative: 2 %
HCT: 31.6 % — ABNORMAL LOW (ref 39.0–52.0)
Hemoglobin: 10.5 g/dL — ABNORMAL LOW (ref 13.0–17.0)
Immature Granulocytes: 0 %
Lymphocytes Relative: 14 %
Lymphs Abs: 0.7 10*3/uL (ref 0.7–4.0)
MCH: 32.9 pg (ref 26.0–34.0)
MCHC: 33.2 g/dL (ref 30.0–36.0)
MCV: 99.1 fL (ref 80.0–100.0)
Monocytes Absolute: 0.4 10*3/uL (ref 0.1–1.0)
Monocytes Relative: 9 %
Neutro Abs: 3.4 10*3/uL (ref 1.7–7.7)
Neutrophils Relative %: 74 %
Platelet Count: 157 10*3/uL (ref 150–400)
RBC: 3.19 MIL/uL — ABNORMAL LOW (ref 4.22–5.81)
RDW: 13.2 % (ref 11.5–15.5)
WBC Count: 4.7 10*3/uL (ref 4.0–10.5)
nRBC: 0 % (ref 0.0–0.2)

## 2022-02-06 MED ORDER — HYDROCODONE-ACETAMINOPHEN 5-325 MG PO TABS: 1.0000 | ORAL_TABLET | Freq: Four times a day (QID) | ORAL | 0 refills | Status: DC | PRN

## 2022-02-06 MED ORDER — SODIUM CHLORIDE 0.9 % IV SOLN
60.0000 mg | Freq: Once | INTRAVENOUS | Status: AC
  Administered 2022-02-06: 60 mg via INTRAVENOUS
  Filled 2022-02-06: qty 10

## 2022-02-06 MED ORDER — SODIUM CHLORIDE 0.9 % IV SOLN: Freq: Once | INTRAVENOUS | Status: AC

## 2022-02-06 MED ORDER — LEUPROLIDE ACETATE (3 MONTH) 22.5 MG ~~LOC~~ KIT
22.5000 mg | PACK | Freq: Once | SUBCUTANEOUS | Status: AC
  Administered 2022-02-06: 22.5 mg via SUBCUTANEOUS
  Filled 2022-02-06: qty 22.5

## 2022-02-07 ENCOUNTER — Other Ambulatory Visit: Payer: Self-pay | Admitting: Hematology

## 2022-02-07 DIAGNOSIS — C61 Malignant neoplasm of prostate: Secondary | ICD-10-CM

## 2022-02-07 LAB — PSA, TOTAL AND FREE
PSA, Free Pct: >25.1 %
PSA, Free: >50 ng/mL
Prostate Specific Ag, Serum: 199 ng/mL — ABNORMAL HIGH (ref 0.0–4.0)

## 2022-02-10 ENCOUNTER — Other Ambulatory Visit: Payer: Self-pay | Admitting: Hematology

## 2022-02-10 ENCOUNTER — Other Ambulatory Visit (HOSPITAL_COMMUNITY): Payer: Self-pay

## 2022-02-10 ENCOUNTER — Encounter: Payer: Self-pay | Admitting: Hematology

## 2022-02-10 ENCOUNTER — Ambulatory Visit (INDEPENDENT_AMBULATORY_CARE_PROVIDER_SITE_OTHER): Payer: Medicare PPO

## 2022-02-10 ENCOUNTER — Other Ambulatory Visit: Payer: Self-pay

## 2022-02-10 DIAGNOSIS — C7951 Secondary malignant neoplasm of bone: Secondary | ICD-10-CM

## 2022-02-10 DIAGNOSIS — C61 Malignant neoplasm of prostate: Secondary | ICD-10-CM

## 2022-02-10 DIAGNOSIS — I495 Sick sinus syndrome: Secondary | ICD-10-CM

## 2022-02-10 LAB — CUP PACEART REMOTE DEVICE CHECK
Battery Remaining Longevity: 84 mo
Battery Remaining Percentage: 100 %
Brady Statistic RA Percent Paced: 28 %
Brady Statistic RV Percent Paced: 36 %
Date Time Interrogation Session: 20230213040100
Implantable Lead Implant Date: 20130926
Implantable Lead Implant Date: 20130926
Implantable Lead Location: 753859
Implantable Lead Location: 753860
Implantable Lead Model: 4456
Implantable Lead Model: 4476
Implantable Lead Serial Number: 473023
Implantable Lead Serial Number: 523746
Implantable Pulse Generator Implant Date: 20210513
Lead Channel Impedance Value: 343 Ohm
Lead Channel Impedance Value: 386 Ohm
Lead Channel Pacing Threshold Amplitude: 0.4 V
Lead Channel Pacing Threshold Pulse Width: 0.4 ms
Lead Channel Setting Pacing Amplitude: 2 V
Lead Channel Setting Pacing Amplitude: 2.5 V
Lead Channel Setting Pacing Pulse Width: 0.4 ms
Lead Channel Setting Sensing Sensitivity: 2.5 mV
Pulse Gen Serial Number: 528325

## 2022-02-10 MED ORDER — HYDROCODONE-ACETAMINOPHEN 5-325 MG PO TABS
1.0000 | ORAL_TABLET | Freq: Four times a day (QID) | ORAL | 0 refills | Status: DC | PRN
  Filled 2022-02-10: qty 60, 15d supply, fill #0

## 2022-02-13 NOTE — Progress Notes (Signed)
Remote pacemaker transmission.   

## 2022-02-14 ENCOUNTER — Encounter: Payer: Self-pay | Admitting: Hematology

## 2022-02-17 ENCOUNTER — Other Ambulatory Visit: Payer: Self-pay

## 2022-02-24 ENCOUNTER — Other Ambulatory Visit (HOSPITAL_COMMUNITY): Payer: Self-pay

## 2022-02-24 DIAGNOSIS — H6122 Impacted cerumen, left ear: Secondary | ICD-10-CM | POA: Diagnosis not present

## 2022-02-24 DIAGNOSIS — H903 Sensorineural hearing loss, bilateral: Secondary | ICD-10-CM | POA: Diagnosis not present

## 2022-03-04 ENCOUNTER — Other Ambulatory Visit (HOSPITAL_COMMUNITY): Payer: Self-pay

## 2022-03-05 ENCOUNTER — Other Ambulatory Visit: Payer: Self-pay

## 2022-03-05 DIAGNOSIS — C61 Malignant neoplasm of prostate: Secondary | ICD-10-CM

## 2022-03-05 DIAGNOSIS — C7951 Secondary malignant neoplasm of bone: Secondary | ICD-10-CM

## 2022-03-06 ENCOUNTER — Inpatient Hospital Stay: Payer: Medicare PPO | Attending: Hematology

## 2022-03-06 ENCOUNTER — Inpatient Hospital Stay: Payer: Medicare PPO

## 2022-03-06 ENCOUNTER — Other Ambulatory Visit: Payer: Self-pay

## 2022-03-06 VITALS — BP 134/66 | HR 83 | Temp 97.6°F | Resp 16 | Wt 171.8 lb

## 2022-03-06 DIAGNOSIS — D539 Nutritional anemia, unspecified: Secondary | ICD-10-CM | POA: Insufficient documentation

## 2022-03-06 DIAGNOSIS — C61 Malignant neoplasm of prostate: Secondary | ICD-10-CM | POA: Insufficient documentation

## 2022-03-06 DIAGNOSIS — C7951 Secondary malignant neoplasm of bone: Secondary | ICD-10-CM | POA: Diagnosis not present

## 2022-03-06 LAB — CMP (CANCER CENTER ONLY)
ALT: 6 U/L (ref 0–44)
AST: 11 U/L — ABNORMAL LOW (ref 15–41)
Albumin: 3.9 g/dL (ref 3.5–5.0)
Alkaline Phosphatase: 77 U/L (ref 38–126)
Anion gap: 7 (ref 5–15)
BUN: 22 mg/dL (ref 8–23)
CO2: 28 mmol/L (ref 22–32)
Calcium: 9.4 mg/dL (ref 8.9–10.3)
Chloride: 105 mmol/L (ref 98–111)
Creatinine: 1.09 mg/dL (ref 0.61–1.24)
GFR, Estimated: >60 mL/min (ref >60)
Glucose, Bld: 84 mg/dL (ref 70–99)
Potassium: 4.2 mmol/L (ref 3.5–5.1)
Sodium: 140 mmol/L (ref 135–145)
Total Bilirubin: 0.4 mg/dL (ref 0.3–1.2)
Total Protein: 5.8 g/dL — ABNORMAL LOW (ref 6.5–8.1)

## 2022-03-06 LAB — CBC WITH DIFFERENTIAL (CANCER CENTER ONLY)
Abs Immature Granulocytes: 0.01 10*3/uL (ref 0.00–0.07)
Basophils Absolute: 0 10*3/uL (ref 0.0–0.1)
Basophils Relative: 1 %
Eosinophils Absolute: 0.1 10*3/uL (ref 0.0–0.5)
Eosinophils Relative: 2 %
HCT: 31 % — ABNORMAL LOW (ref 39.0–52.0)
Hemoglobin: 10.3 g/dL — ABNORMAL LOW (ref 13.0–17.0)
Immature Granulocytes: 0 %
Lymphocytes Relative: 16 %
Lymphs Abs: 0.7 10*3/uL (ref 0.7–4.0)
MCH: 32.6 pg (ref 26.0–34.0)
MCHC: 33.2 g/dL (ref 30.0–36.0)
MCV: 98.1 fL (ref 80.0–100.0)
Monocytes Absolute: 0.4 10*3/uL (ref 0.1–1.0)
Monocytes Relative: 9 %
Neutro Abs: 3.1 10*3/uL (ref 1.7–7.7)
Neutrophils Relative %: 72 %
Platelet Count: 179 10*3/uL (ref 150–400)
RBC: 3.16 MIL/uL — ABNORMAL LOW (ref 4.22–5.81)
RDW: 12.8 % (ref 11.5–15.5)
WBC Count: 4.3 10*3/uL (ref 4.0–10.5)
nRBC: 0 % (ref 0.0–0.2)

## 2022-03-06 MED ORDER — SODIUM CHLORIDE 0.9 % IV SOLN
60.0000 mg | Freq: Once | INTRAVENOUS | Status: AC
  Administered 2022-03-06: 12:00:00 60 mg via INTRAVENOUS
  Filled 2022-03-06: qty 10

## 2022-03-06 MED ORDER — SODIUM CHLORIDE 0.9 % IV SOLN: INTRAVENOUS | Status: DC

## 2022-03-06 NOTE — Patient Instructions (Addendum)
Pamidronate Injection ?What is this medication? ?PAMIDRONATE (pa mi DROE nate) slows calcium loss from bones. It treats Paget's disease and high calcium levels in the blood from some kinds of cancer. It may be used in other people at risk for bone loss. ?This medicine may be used for other purposes; ask your health care provider or pharmacist if you have questions. ?COMMON BRAND NAME(S): Aredia ?What should I tell my care team before I take this medication? ?They need to know if you have any of these conditions: ?bleeding disorder ?cancer ?dental disease ?kidney disease ?low levels of calcium or other minerals in the blood ?low red blood cell counts ?receiving steroids like dexamethasone or prednisone ?an unusual or allergic reaction to pamidronate, other drugs, foods, dyes or preservatives ?pregnant or trying to get pregnant ?breast-feeding ?How should I use this medication? ?This drug is injected into a vein. It is given by a health care provider in a hospital or clinic setting. ?Talk to your health care provider about the use of this drug in children. Special care may be needed. ?Overdosage: If you think you have taken too much of this medicine contact a poison control center or emergency room at once. ?NOTE: This medicine is only for you. Do not share this medicine with others. ?What if I miss a dose? ?Keep appointments for follow-up doses. It is important not to miss your dose. Call your health care provider if you are unable to keep an appointment. ?What may interact with this medication? ?certain antibiotics given by injection ?medicines for inflammation or pain like ibuprofen, naproxen ?some diuretics like bumetanide, furosemide ?cyclosporine ?parathyroid hormone ?tacrolimus ?teriparatide ?thalidomide ?This list may not describe all possible interactions. Give your health care provider a list of all the medicines, herbs, non-prescription drugs, or dietary supplements you use. Also tell them if you smoke,  drink alcohol, or use illegal drugs. Some items may interact with your medicine. ?What should I watch for while using this medication? ?Visit your health care provider for regular checks on your progress. It may be some time before you see the benefit from this drug. ?Some people who take this drug have severe bone, joint, or muscle pain. This drug may also increase your risk for jaw problems or a broken thigh bone. Tell your health care provider right away if you have severe pain in your jaw, bones, joints, or muscles. Tell you health care provider if you have any pain that does not go away or that gets worse. ?Tell your dentist and dental surgeon that you are taking this drug. You should not have major dental surgery while on this drug. See your dentist to have a dental exam and fix any dental problems before starting this drug. Take good care of your teeth while on this drug. Make sure you see your dentist for regular follow-up appointments. ?You should make sure you get enough calcium and vitamin D while you are taking this drug. Discuss the foods you eat and the vitamins you take with your health care provider. ?You may need blood work done while you are taking this drug. ?Do not become pregnant while taking this drug. Women should inform their health care provider if they wish to become pregnant or think they might be pregnant. There is potential for serious harm to an unborn child. Talk to your health care provider for more information. ?What side effects may I notice from receiving this medication? ?Side effects that you should report to your doctor or health care   provider as soon as possible: ?allergic reactions (skin rash, itching or hives; swelling of the face, lips, or tongue) ?bleeding (bloody or black, tarry stools; red or dark brown urine; spitting up blood or brown material that looks like coffee grounds; red spots on the skin; unusual bruising or bleeding from the eyes, gums, or nose) ?bone  pain ?increased thirst ?infection (fever, chills, cough, sore throat, pain or trouble passing urine) ?jaw pain, especially after dental work ?joint pain ?kidney injury (trouble passing urine or change in the amount of urine) ?low calcium levels (fast heartbeat; muscle cramps or pain; pain, tingling, or numbness in the hands or feet; seizures) ?low magnesium levels (fast, irregular heartbeat; muscle cramp or pain; muscle weakness; tremors; seizures) ?low potassium levels (trouble breathing; chest pain; dizziness; fast, irregular heartbeat; feeling faint or lightheaded, falls; muscle cramps or pain) ?muscle pain ?pain, redness, or irritation at site where injected ?redness, blistering, peeling, or loosening of the skin, including inside the mouth ?severe diarrhea ?unusual sweating ?Side effects that usually do not require medical attention (report to your doctor or health care provider if they continue or are bothersome): ?constipation ?eye irritation, itching, or pain ?fever ?headache ?increase in blood pressure ?loss of appetite ?nausea ?stomach pain ?unusually weak or tired ?vomiting ?This list may not describe all possible side effects. Call your doctor for medical advice about side effects. You may report side effects to FDA at 1-800-FDA-1088. ?Where should I keep my medication? ?This drug is given in a hospital or clinic. It will not be stored at home. ?NOTE: This sheet is a summary. It may not cover all possible information. If you have questions about this medicine, talk to your doctor, pharmacist, or health care provider. ?? 2022 Elsevier/Gold Standard (2021-09-03 00:00:00) ? ?

## 2022-03-07 LAB — PSA, TOTAL AND FREE
PSA, Free Pct: 15.7 %
PSA, Free: 37.6 ng/mL
Prostate Specific Ag, Serum: 239 ng/mL — ABNORMAL HIGH (ref 0.0–4.0)

## 2022-03-25 ENCOUNTER — Other Ambulatory Visit (HOSPITAL_COMMUNITY): Payer: Self-pay

## 2022-03-27 ENCOUNTER — Ambulatory Visit: Payer: Medicare PPO

## 2022-04-01 ENCOUNTER — Other Ambulatory Visit (HOSPITAL_COMMUNITY): Payer: Self-pay

## 2022-04-02 ENCOUNTER — Inpatient Hospital Stay: Payer: Medicare PPO

## 2022-04-02 ENCOUNTER — Other Ambulatory Visit (HOSPITAL_COMMUNITY): Payer: Self-pay

## 2022-04-02 ENCOUNTER — Inpatient Hospital Stay: Payer: Medicare PPO | Admitting: Hematology

## 2022-04-02 ENCOUNTER — Inpatient Hospital Stay: Payer: Medicare PPO | Attending: Hematology

## 2022-04-02 ENCOUNTER — Other Ambulatory Visit: Payer: Self-pay

## 2022-04-02 DIAGNOSIS — C7951 Secondary malignant neoplasm of bone: Secondary | ICD-10-CM | POA: Insufficient documentation

## 2022-04-02 DIAGNOSIS — C61 Malignant neoplasm of prostate: Secondary | ICD-10-CM

## 2022-04-02 LAB — CBC WITH DIFFERENTIAL (CANCER CENTER ONLY)
Abs Immature Granulocytes: 0.02 10*3/uL (ref 0.00–0.07)
Basophils Absolute: 0 10*3/uL (ref 0.0–0.1)
Basophils Relative: 0 %
Eosinophils Absolute: 0.1 10*3/uL (ref 0.0–0.5)
Eosinophils Relative: 2 %
HCT: 31.7 % — ABNORMAL LOW (ref 39.0–52.0)
Hemoglobin: 10.3 g/dL — ABNORMAL LOW (ref 13.0–17.0)
Immature Granulocytes: 0 %
Lymphocytes Relative: 14 %
Lymphs Abs: 0.7 10*3/uL (ref 0.7–4.0)
MCH: 32.2 pg (ref 26.0–34.0)
MCHC: 32.5 g/dL (ref 30.0–36.0)
MCV: 99.1 fL (ref 80.0–100.0)
Monocytes Absolute: 0.4 10*3/uL (ref 0.1–1.0)
Monocytes Relative: 9 %
Neutro Abs: 3.6 10*3/uL (ref 1.7–7.7)
Neutrophils Relative %: 75 %
Platelet Count: 143 10*3/uL — ABNORMAL LOW (ref 150–400)
RBC: 3.2 MIL/uL — ABNORMAL LOW (ref 4.22–5.81)
RDW: 13.4 % (ref 11.5–15.5)
WBC Count: 4.8 10*3/uL (ref 4.0–10.5)
nRBC: 0 % (ref 0.0–0.2)

## 2022-04-02 LAB — CMP (CANCER CENTER ONLY)
ALT: 5 U/L (ref 0–44)
AST: 11 U/L — ABNORMAL LOW (ref 15–41)
Albumin: 4.4 g/dL (ref 3.5–5.0)
Alkaline Phosphatase: 92 U/L (ref 38–126)
Anion gap: 7 (ref 5–15)
BUN: 24 mg/dL — ABNORMAL HIGH (ref 8–23)
CO2: 27 mmol/L (ref 22–32)
Calcium: 9.5 mg/dL (ref 8.9–10.3)
Chloride: 105 mmol/L (ref 98–111)
Creatinine: 1.13 mg/dL (ref 0.61–1.24)
GFR, Estimated: 60 mL/min — ABNORMAL LOW (ref >60)
Glucose, Bld: 110 mg/dL — ABNORMAL HIGH (ref 70–99)
Potassium: 3.8 mmol/L (ref 3.5–5.1)
Sodium: 139 mmol/L (ref 135–145)
Total Bilirubin: 0.7 mg/dL (ref 0.3–1.2)
Total Protein: 6.8 g/dL (ref 6.5–8.1)

## 2022-04-02 MED ORDER — ENZALUTAMIDE 80 MG PO TABS
160.0000 mg | ORAL_TABLET | Freq: Every day | ORAL | 3 refills | Status: AC
  Filled 2022-04-02: qty 60, 30d supply, fill #0
  Filled 2022-04-25: qty 60, 30d supply, fill #1

## 2022-04-02 MED ORDER — SODIUM CHLORIDE 0.9 % IV SOLN
60.0000 mg | Freq: Once | INTRAVENOUS | Status: AC
  Administered 2022-04-02: 60 mg via INTRAVENOUS
  Filled 2022-04-02: qty 20

## 2022-04-02 MED ORDER — SODIUM CHLORIDE 0.9 % IV SOLN: Freq: Once | INTRAVENOUS | Status: AC

## 2022-04-02 MED ORDER — SODIUM CHLORIDE 0.9 % IV SOLN
60.0000 mg | Freq: Once | INTRAVENOUS | Status: DC
  Filled 2022-04-02: qty 10

## 2022-04-02 NOTE — Patient Instructions (Signed)
Pamidronate Injection ?What is this medication? ?PAMIDRONATE (pa mi DROE nate) slows calcium loss from bones. It treats Paget's disease and high calcium levels in the blood from some kinds of cancer. It may be used in other people at risk for bone loss. ?This medicine may be used for other purposes; ask your health care provider or pharmacist if you have questions. ?COMMON BRAND NAME(S): Aredia ?What should I tell my care team before I take this medication? ?They need to know if you have any of these conditions: ?bleeding disorder ?cancer ?dental disease ?kidney disease ?low levels of calcium or other minerals in the blood ?low red blood cell counts ?receiving steroids like dexamethasone or prednisone ?an unusual or allergic reaction to pamidronate, other drugs, foods, dyes or preservatives ?pregnant or trying to get pregnant ?breast-feeding ?How should I use this medication? ?This drug is injected into a vein. It is given by a health care provider in a hospital or clinic setting. ?Talk to your health care provider about the use of this drug in children. Special care may be needed. ?Overdosage: If you think you have taken too much of this medicine contact a poison control center or emergency room at once. ?NOTE: This medicine is only for you. Do not share this medicine with others. ?What if I miss a dose? ?Keep appointments for follow-up doses. It is important not to miss your dose. Call your health care provider if you are unable to keep an appointment. ?What may interact with this medication? ?certain antibiotics given by injection ?medicines for inflammation or pain like ibuprofen, naproxen ?some diuretics like bumetanide, furosemide ?cyclosporine ?parathyroid hormone ?tacrolimus ?teriparatide ?thalidomide ?This list may not describe all possible interactions. Give your health care provider a list of all the medicines, herbs, non-prescription drugs, or dietary supplements you use. Also tell them if you smoke,  drink alcohol, or use illegal drugs. Some items may interact with your medicine. ?What should I watch for while using this medication? ?Visit your health care provider for regular checks on your progress. It may be some time before you see the benefit from this drug. ?Some people who take this drug have severe bone, joint, or muscle pain. This drug may also increase your risk for jaw problems or a broken thigh bone. Tell your health care provider right away if you have severe pain in your jaw, bones, joints, or muscles. Tell you health care provider if you have any pain that does not go away or that gets worse. ?Tell your dentist and dental surgeon that you are taking this drug. You should not have major dental surgery while on this drug. See your dentist to have a dental exam and fix any dental problems before starting this drug. Take good care of your teeth while on this drug. Make sure you see your dentist for regular follow-up appointments. ?You should make sure you get enough calcium and vitamin D while you are taking this drug. Discuss the foods you eat and the vitamins you take with your health care provider. ?You may need blood work done while you are taking this drug. ?Do not become pregnant while taking this drug. Women should inform their health care provider if they wish to become pregnant or think they might be pregnant. There is potential for serious harm to an unborn child. Talk to your health care provider for more information. ?What side effects may I notice from receiving this medication? ?Side effects that you should report to your doctor or health care   provider as soon as possible: ?allergic reactions (skin rash, itching or hives; swelling of the face, lips, or tongue) ?bleeding (bloody or black, tarry stools; red or dark brown urine; spitting up blood or brown material that looks like coffee grounds; red spots on the skin; unusual bruising or bleeding from the eyes, gums, or nose) ?bone  pain ?increased thirst ?infection (fever, chills, cough, sore throat, pain or trouble passing urine) ?jaw pain, especially after dental work ?joint pain ?kidney injury (trouble passing urine or change in the amount of urine) ?low calcium levels (fast heartbeat; muscle cramps or pain; pain, tingling, or numbness in the hands or feet; seizures) ?low magnesium levels (fast, irregular heartbeat; muscle cramp or pain; muscle weakness; tremors; seizures) ?low potassium levels (trouble breathing; chest pain; dizziness; fast, irregular heartbeat; feeling faint or lightheaded, falls; muscle cramps or pain) ?muscle pain ?pain, redness, or irritation at site where injected ?redness, blistering, peeling, or loosening of the skin, including inside the mouth ?severe diarrhea ?unusual sweating ?Side effects that usually do not require medical attention (report to your doctor or health care provider if they continue or are bothersome): ?constipation ?eye irritation, itching, or pain ?fever ?headache ?increase in blood pressure ?loss of appetite ?nausea ?stomach pain ?unusually weak or tired ?vomiting ?This list may not describe all possible side effects. Call your doctor for medical advice about side effects. You may report side effects to FDA at 1-800-FDA-1088. ?Where should I keep my medication? ?This drug is given in a hospital or clinic. It will not be stored at home. ?NOTE: This sheet is a summary. It may not cover all possible information. If you have questions about this medicine, talk to your doctor, pharmacist, or health care provider. ?? 2022 Elsevier/Gold Standard (2021-09-03 00:00:00) ? ?

## 2022-04-03 ENCOUNTER — Other Ambulatory Visit: Payer: Medicare PPO

## 2022-04-03 ENCOUNTER — Ambulatory Visit: Payer: Medicare PPO

## 2022-04-03 LAB — PSA, TOTAL AND FREE
PSA, Free Pct: 21.3 %
PSA, Free: 47.2 ng/mL
Prostate Specific Ag, Serum: 222 ng/mL — ABNORMAL HIGH (ref 0.0–4.0)

## 2022-04-07 ENCOUNTER — Telehealth: Payer: Self-pay | Admitting: Hematology

## 2022-04-07 NOTE — Telephone Encounter (Signed)
Left message with follow-up appointments per 4/5 los. ?

## 2022-04-09 ENCOUNTER — Encounter: Payer: Self-pay | Admitting: Hematology

## 2022-04-09 NOTE — Progress Notes (Signed)
? ? ?HEMATOLOGY/ONCOLOGY CLINIC NOTE ? ?Date of Service: .04/02/2022 ? ? ?Patient Care Team: ?[name], MD as PCP - General (Family Medicine) ?[name], MD as PCP - Cardiology (Cardiology) ?[name], MD as PCP - Electrophysiology (Cardiology) ? ?CHIEF COMPLAINTS/PURPOSE OF CONSULTATION:  ?Follow-up for continued evaluation and management of metastatic prostate cancer ? ?HISTORY OF PRESENTING ILLNESS:  ?Please see previous note for details on initial presentation ? ?INTERVAL HISTORY ? ?Jacob Warner is here for continued evaluation and management of his metastatic prostate cancer. ?He is tolerating his full dose of enzalutamide 160 mg p.o. daily without any notable toxicities. ?He continues to be on his bisphosphonate and Lupron without any significant toxicities. ?No significant uncontrolled bone pain. ?No fevers no chills no night sweats no unexpected weight loss. ?Accompanied by his primary care physician for this clinic visit. ?Labs done today were reviewed with the patient in detail.. ? ? ?MEDICAL HISTORY:  ?Past Medical History:  ?Diagnosis Date  ? A-fib ([place])   ? Alopecia   ? Anemia   ? Bradycardia   ? Cancer [facility])   ? Cherry angioma   ? Echocardiogram   ? Echo 11/19: Mild concentric LVH, EF 50-55, normal wall motion, grade 1 diastolic dysfunction, mild AI, MAC, mild MR, normal RVSF, moderate TR, PASP 36  ? GERD (gastroesophageal reflux disease)   ? Glaucoma   ? Hypertension   ? Nuclear stress test   ? Nuclear stress test 11/19: not gated, inf and apical defect c/w small scar and/or soft tissue attenuation  ? Pelvic fracture ([facility])   ? Right BBB/left ant fasc block   ? Sick sinus syndrome ([place])   ? TIA (transient ischemic attack)   ? Xeroderma   ? ? ?SURGICAL HISTORY: ?Past Surgical History:  ?Procedure Laterality Date  ? APPENDECTOMY    ? CERVICAL SPINE SURGERY    ? EXPLORATORY LAPAROTOMY    ? LITHOTRIPSY    ? PACEMAKER PLACEMENT    ? PPM GENERATOR CHANGEOUT N/A 05/10/2020  ? Procedure: PPM  GENERATOR CHANGEOUT;  Surgeon: [name], MD;  Location: [place] [facility];  Service: Cardiovascular;  Laterality: N/A;  ? SPINAL CORD STIMULATOR IMPLANT Bilateral 2018  ? [name], MD PhD, [facility]  ? ? ?SOCIAL HISTORY: ?Social History  ? ?Socioeconomic History  ? Marital status: Married  ?  Spouse name: Not on file  ? Number of children: Not on file  ? Years of education: Not on file  ? Highest education level: Not on file  ?Occupational History  ? Not on file  ?Tobacco Use  ? Smoking status: Never  ? Smokeless tobacco: Never  ?Vaping Use  ? Vaping Use: Never used  ?Substance and Sexual Activity  ? Alcohol use: No  ?  Alcohol/week: 0.0 standard drinks  ? Drug use: No  ? Sexual activity: Not on file  ?Other Topics Concern  ? Not on file  ?Social History Narrative  ? Not on file  ? ?Social Determinants of Health  ? ?Financial Resource Strain: Not on file  ?Food Insecurity: Not on file  ?Transportation Needs: Not on file  ?Physical Activity: Not on file  ?Stress: Not on file  ?Social Connections: Not on file  ?Intimate Partner Violence: Not on file  ? ? ?FAMILY HISTORY: ?Family History  ?Problem Relation Age of Onset  ? Brain cancer Sister   ? ? ?ALLERGIES:  is allergic to sulfamethoxazole-trimethoprim, sulfa antibiotics, and sulfacetamide sodium. ? ?MEDICATIONS:  ?  Current Outpatient Medications  ?Medication Sig Dispense Refill  ? acetaminophen (TYLENOL) 500 MG tablet Take 500-1,000 mg by mouth See admin instructions. 500 mg twice daily, 1000 mg at bedtime    ? azelastine (ASTELIN) 0.1 % nasal spray Place 1 spray into both nostrils 2 (two) times daily as needed for rhinitis.  (Patient not taking: Reported on 10/10/2021)    ? calcium carbonate (TUMS - DOSED IN MG ELEMENTAL CALCIUM) 500 MG chewable tablet 1 tablet    ? denosumab (PROLIA) 60 MG/ML SOSY injection Inject 60 mg into the skin every 6 (six) months. (Patient not taking: Reported on 10/10/2021)    ? dutasteride  (AVODART) 0.5 MG capsule Take 0.5 mg by mouth daily.    ? enzalutamide (XTANDI) 80 MG tablet Take 2 tablets (160 mg total) by mouth daily. 60 tablet 3  ? furosemide (LASIX) 40 MG tablet Take 40 mg by mouth daily.    ? gabapentin (NEURONTIN) 300 MG capsule Take 1 capsule (300 mg total) by mouth at bedtime. 90 capsule 1  ? HYDROcodone-acetaminophen (NORCO/VICODIN) 5-325 MG tablet Take 1 tablet by mouth every 6 (six) hours as needed for moderate pain or severe pain. 60 tablet 0  ? hydroxypropyl methylcellulose / hypromellose (ISOPTO TEARS / GONIOVISC) 2.5 % ophthalmic solution Place 1 drop into both eyes as needed for dry eyes.    ? ibuprofen (ADVIL) 200 MG tablet 1 tablet with food or milk as needed    ? ketoconazole (NIZORAL) 2 % shampoo Apply 1 application topically 2 (two) times a week.     ? lactulose (CHRONULAC) 10 GM/15ML solution Take 10 g by mouth daily. (Patient not taking: Reported on 10/10/2021)  0  ? latanoprost (XALATAN) 0.005 % ophthalmic solution Place 1 drop into both eyes at bedtime.  (Patient not taking: Reported on 10/10/2021)  11  ? magnesium oxide (MAG-OX) 400 MG tablet 1 tablet    ? Magnesium Oxide 400 (240 Mg) MG TABS Take 1 tablet by mouth 2 (two) times daily. (Patient not taking: Reported on 10/10/2021)  7  ? meloxicam (MOBIC) 15 MG tablet Take 15 mg by mouth daily. (Patient not taking: Reported on 10/10/2021)    ? montelukast (SINGULAIR) 10 MG tablet 1 tablet (Patient not taking: Reported on 10/10/2021)    ? nitroGLYCERIN (NITROSTAT) 0.4 MG SL tablet Place 1 tablet (0.4 mg total) under the tongue every 5 (five) minutes as needed for chest pain. 25 tablet 5  ? pantoprazole (PROTONIX) 40 MG tablet Take 1 tablet by mouth daily. (Patient not taking: Reported on 10/10/2021)    ? RESTASIS 0.05 % ophthalmic emulsion Place 1 drop into both eyes daily.   11  ? ROCKLATAN 0.02-0.005 % SOLN     ? topiramate (TOPAMAX) 25 MG tablet  (Patient not taking: Reported on 10/10/2021)    ? traZODone (DESYREL) 50  MG tablet Take 50 mg by mouth at bedtime.   11  ? vitamin B-12 (CYANOCOBALAMIN) 1000 MCG tablet Take 1,000 mcg by mouth daily.    ? White Petrolatum-Mineral Oil (EYE LUBRICANT) OINT Apply 1 strip to eye at bedtime.    ? ?No current facility-administered medications for this visit.  ? ? ?REVIEW OF SYSTEMS:   ?10 Point review of Systems was done is negative except as noted above. ? ?PHYSICAL EXAMINATION: ?.BP (!) 122/49   Pulse 85   Temp (!) 97.5 ?F (36.4 ?C)   Resp 18   Wt 174 lb 9.6 oz (79.2 kg)   SpO2 100%  BMI 25.05 kg/m?  ?. ?NAD ?GENERAL:alert, in no acute distress and comfortable ?SKIN: no acute rashes, no significant lesions ?EYES: conjunctiva are pink and non-injected, sclera anicteric ?OROPHARYNX: MMM, no exudates, no oropharyngeal erythema or ulceration ?NECK: supple, no JVD ?LYMPH:  no palpable lymphadenopathy in the cervical, axillary or inguinal regions ?LUNGS: clear to auscultation b/l with normal respiratory effort ?HEART: regular rate & rhythm ?ABDOMEN:  normoactive bowel sounds , non tender, not distended. ?Extremity: no pedal edema ?PSYCH: alert & oriented x 3 with fluent speech ?NEURO: no focal motor/sensory deficits ? ?LABORATORY DATA:  ?I have reviewed the data as listed ? ?. ? ?  Latest Ref Rng & Units 04/02/2022  ?  8:46 AM 03/06/2022  ? 10:40 AM 02/06/2022  ? 11:49 AM  ?CBC  ?WBC 4.0 - 10.5 K/uL 4.8   4.3   4.7    ?Hemoglobin 13.0 - 17.0 g/dL 10.3   10.3   10.5    ?Hematocrit 39.0 - 52.0 % 31.7   31.0   31.6    ?Platelets 150 - 400 K/uL 143   179   157    ? ?. ?CBC ?   ?Component Value Date/Time  ? WBC 4.8 04/02/2022 0846  ? WBC 5.5 11/14/2021 1055  ? RBC 3.20 (L) 04/02/2022 0846  ? HGB 10.3 (L) 04/02/2022 0846  ? HGB 11.4 (L) 04/20/2020 1106  ? HGB 12.2 (L) 07/08/2017 1336  ? HCT 31.7 (L) 04/02/2022 0846  ? HCT 34.1 (L) 09/17/2020 1205  ? HCT 35.6 (L) 07/08/2017 1336  ? PLT 143 (L) 04/02/2022 0846  ? PLT 221 04/20/2020 1106  ? MCV 99.1 04/02/2022 0846  ? MCV 97 04/20/2020 1106  ? MCV 96.2  07/08/2017 1336  ? MCH 32.2 04/02/2022 0846  ? MCHC 32.5 04/02/2022 0846  ? RDW 13.4 04/02/2022 0846  ? RDW 13.2 04/20/2020 1106  ? RDW 13.9 07/08/2017 1336  ? LYMPHSABS 0.7 04/02/2022 0846  ? LYMPHSABS 1.3 04/21/19

## 2022-04-12 ENCOUNTER — Other Ambulatory Visit: Payer: Self-pay

## 2022-04-12 ENCOUNTER — Emergency Department (HOSPITAL_COMMUNITY): Payer: Medicare PPO

## 2022-04-12 ENCOUNTER — Inpatient Hospital Stay (HOSPITAL_COMMUNITY)
Admission: EM | Admit: 2022-04-12 | Discharge: 2022-04-15 | DRG: 312 | Disposition: A | Discharge status: Home Health | Payer: Medicare PPO | Source: Skilled Nursing Facility | Attending: Internal Medicine | Admitting: Internal Medicine

## 2022-04-12 DIAGNOSIS — Z808 Family history of malignant neoplasm of other organs or systems: Secondary | ICD-10-CM

## 2022-04-12 DIAGNOSIS — J984 Other disorders of lung: Secondary | ICD-10-CM | POA: Diagnosis not present

## 2022-04-12 DIAGNOSIS — I110 Hypertensive heart disease with heart failure: Secondary | ICD-10-CM | POA: Diagnosis present

## 2022-04-12 DIAGNOSIS — K219 Gastro-esophageal reflux disease without esophagitis: Secondary | ICD-10-CM | POA: Diagnosis present

## 2022-04-12 DIAGNOSIS — D696 Thrombocytopenia, unspecified: Secondary | ICD-10-CM | POA: Diagnosis present

## 2022-04-12 DIAGNOSIS — R412 Retrograde amnesia: Secondary | ICD-10-CM | POA: Diagnosis present

## 2022-04-12 DIAGNOSIS — I700 Atherosclerosis of aorta: Secondary | ICD-10-CM | POA: Diagnosis not present

## 2022-04-12 DIAGNOSIS — Z8673 Personal history of transient ischemic attack (TIA), and cerebral infarction without residual deficits: Secondary | ICD-10-CM | POA: Diagnosis not present

## 2022-04-12 DIAGNOSIS — Z950 Presence of cardiac pacemaker: Secondary | ICD-10-CM

## 2022-04-12 DIAGNOSIS — Z79899 Other long term (current) drug therapy: Secondary | ICD-10-CM

## 2022-04-12 DIAGNOSIS — Z7982 Long term (current) use of aspirin: Secondary | ICD-10-CM

## 2022-04-12 DIAGNOSIS — Z20822 Contact with and (suspected) exposure to covid-19: Secondary | ICD-10-CM | POA: Diagnosis present

## 2022-04-12 DIAGNOSIS — R4182 Altered mental status, unspecified: Secondary | ICD-10-CM | POA: Diagnosis not present

## 2022-04-12 DIAGNOSIS — R55 Syncope and collapse: Principal | ICD-10-CM | POA: Diagnosis present

## 2022-04-12 DIAGNOSIS — G894 Chronic pain syndrome: Secondary | ICD-10-CM | POA: Diagnosis present

## 2022-04-12 DIAGNOSIS — C61 Malignant neoplasm of prostate: Secondary | ICD-10-CM | POA: Diagnosis present

## 2022-04-12 DIAGNOSIS — D509 Iron deficiency anemia, unspecified: Secondary | ICD-10-CM | POA: Diagnosis present

## 2022-04-12 DIAGNOSIS — I495 Sick sinus syndrome: Secondary | ICD-10-CM | POA: Diagnosis present

## 2022-04-12 DIAGNOSIS — R410 Disorientation, unspecified: Secondary | ICD-10-CM

## 2022-04-12 DIAGNOSIS — I480 Paroxysmal atrial fibrillation: Secondary | ICD-10-CM | POA: Diagnosis present

## 2022-04-12 DIAGNOSIS — L89151 Pressure ulcer of sacral region, stage 1: Secondary | ICD-10-CM | POA: Diagnosis present

## 2022-04-12 DIAGNOSIS — G9341 Metabolic encephalopathy: Secondary | ICD-10-CM | POA: Diagnosis present

## 2022-04-12 DIAGNOSIS — C7951 Secondary malignant neoplasm of bone: Secondary | ICD-10-CM | POA: Diagnosis present

## 2022-04-12 DIAGNOSIS — I517 Cardiomegaly: Secondary | ICD-10-CM | POA: Diagnosis not present

## 2022-04-12 DIAGNOSIS — L8990 Pressure ulcer of unspecified site, unspecified stage: Secondary | ICD-10-CM | POA: Insufficient documentation

## 2022-04-12 DIAGNOSIS — I2510 Atherosclerotic heart disease of native coronary artery without angina pectoris: Secondary | ICD-10-CM | POA: Diagnosis not present

## 2022-04-12 DIAGNOSIS — I951 Orthostatic hypotension: Secondary | ICD-10-CM | POA: Diagnosis not present

## 2022-04-12 DIAGNOSIS — I5032 Chronic diastolic (congestive) heart failure: Secondary | ICD-10-CM | POA: Diagnosis present

## 2022-04-12 DIAGNOSIS — R001 Bradycardia, unspecified: Secondary | ICD-10-CM | POA: Diagnosis present

## 2022-04-12 DIAGNOSIS — R531 Weakness: Secondary | ICD-10-CM

## 2022-04-12 LAB — I-STAT CHEM 8, ED
BUN: 25 mg/dL — ABNORMAL HIGH (ref 8–23)
Calcium, Ion: 1.21 mmol/L (ref 1.15–1.40)
Chloride: 98 mmol/L (ref 98–111)
Creatinine, Ser: 1.1 mg/dL (ref 0.61–1.24)
Glucose, Bld: 95 mg/dL (ref 70–99)
HCT: 31 % — ABNORMAL LOW (ref 39.0–52.0)
Hemoglobin: 10.5 g/dL — ABNORMAL LOW (ref 13.0–17.0)
Potassium: 4.7 mmol/L (ref 3.5–5.1)
Sodium: 135 mmol/L (ref 135–145)
TCO2: 27 mmol/L (ref 22–32)

## 2022-04-12 LAB — COMPREHENSIVE METABOLIC PANEL WITH GFR
ALT: 9 U/L (ref 0–44)
AST: 14 U/L — ABNORMAL LOW (ref 15–41)
Albumin: 4.3 g/dL (ref 3.5–5.0)
Alkaline Phosphatase: 68 U/L (ref 38–126)
Anion gap: 6 (ref 5–15)
BUN: 26 mg/dL — ABNORMAL HIGH (ref 8–23)
CO2: 27 mmol/L (ref 22–32)
Calcium: 9.3 mg/dL (ref 8.9–10.3)
Chloride: 101 mmol/L (ref 98–111)
Creatinine, Ser: 1.1 mg/dL (ref 0.61–1.24)
GFR, Estimated: >60 mL/min
Glucose, Bld: 96 mg/dL (ref 70–99)
Potassium: 4.6 mmol/L (ref 3.5–5.1)
Sodium: 134 mmol/L — ABNORMAL LOW (ref 135–145)
Total Bilirubin: 0.6 mg/dL (ref 0.3–1.2)
Total Protein: 6.6 g/dL (ref 6.5–8.1)

## 2022-04-12 LAB — BLOOD GAS, VENOUS
Acid-Base Excess: 2 mmol/L (ref 0.0–2.0)
Bicarbonate: 27.8 mmol/L (ref 20.0–28.0)
O2 Saturation: 44.2 %
Patient temperature: 37
pCO2, Ven: 47 mmHg (ref 44–60)
pH, Ven: 7.38 (ref 7.25–7.43)
pO2, Ven: <31 mmHg — CL (ref 32–45)

## 2022-04-12 LAB — URINALYSIS, ROUTINE W REFLEX MICROSCOPIC
Bacteria, UA: NONE SEEN
Bilirubin Urine: NEGATIVE
Glucose, UA: NEGATIVE mg/dL
Ketones, ur: NEGATIVE mg/dL
Leukocytes,Ua: NEGATIVE
Nitrite: NEGATIVE
Protein, ur: NEGATIVE mg/dL
Specific Gravity, Urine: 1.016 (ref 1.005–1.030)
pH: 7 (ref 5.0–8.0)

## 2022-04-12 LAB — D-DIMER, QUANTITATIVE: D-Dimer, Quant: 3.64 ug/mL-FEU — ABNORMAL HIGH (ref 0.00–0.50)

## 2022-04-12 LAB — CBC WITH DIFFERENTIAL/PLATELET
Abs Immature Granulocytes: 0.01 10*3/uL (ref 0.00–0.07)
Basophils Absolute: 0 10*3/uL (ref 0.0–0.1)
Basophils Relative: 0 %
Eosinophils Absolute: 0.1 10*3/uL (ref 0.0–0.5)
Eosinophils Relative: 1 %
HCT: 30.9 % — ABNORMAL LOW (ref 39.0–52.0)
Hemoglobin: 10.4 g/dL — ABNORMAL LOW (ref 13.0–17.0)
Immature Granulocytes: 0 %
Lymphocytes Relative: 13 %
Lymphs Abs: 0.7 10*3/uL (ref 0.7–4.0)
MCH: 33.9 pg (ref 26.0–34.0)
MCHC: 33.7 g/dL (ref 30.0–36.0)
MCV: 100.7 fL — ABNORMAL HIGH (ref 80.0–100.0)
Monocytes Absolute: 0.5 10*3/uL (ref 0.1–1.0)
Monocytes Relative: 9 %
Neutro Abs: 3.7 10*3/uL (ref 1.7–7.7)
Neutrophils Relative %: 77 %
Platelets: 145 10*3/uL — ABNORMAL LOW (ref 150–400)
RBC: 3.07 MIL/uL — ABNORMAL LOW (ref 4.22–5.81)
RDW: 13.9 % (ref 11.5–15.5)
WBC: 4.9 10*3/uL (ref 4.0–10.5)
nRBC: 0 % (ref 0.0–0.2)

## 2022-04-12 LAB — TROPONIN I (HIGH SENSITIVITY)
Troponin I (High Sensitivity): 63 ng/L — ABNORMAL HIGH
Troponin I (High Sensitivity): 59 ng/L — ABNORMAL HIGH (ref <18)

## 2022-04-12 LAB — RAPID URINE DRUG SCREEN, HOSP PERFORMED
Amphetamines: NOT DETECTED
Barbiturates: NOT DETECTED
Benzodiazepines: NOT DETECTED
Cocaine: NOT DETECTED
Opiates: NOT DETECTED
Tetrahydrocannabinol: NOT DETECTED

## 2022-04-12 LAB — AMMONIA: Ammonia: 12 umol/L (ref 9–35)

## 2022-04-12 LAB — RESP PANEL BY RT-PCR (FLU A&B, COVID) ARPGX2
Influenza A by PCR: NEGATIVE
Influenza B by PCR: NEGATIVE
SARS Coronavirus 2 by RT PCR: NEGATIVE

## 2022-04-12 LAB — ETHANOL: Alcohol, Ethyl (B): <10 mg/dL

## 2022-04-12 LAB — LACTIC ACID, PLASMA: Lactic Acid, Venous: 1.2 mmol/L (ref 0.5–1.9)

## 2022-04-12 LAB — BRAIN NATRIURETIC PEPTIDE: B Natriuretic Peptide: 220.2 pg/mL — ABNORMAL HIGH (ref 0.0–100.0)

## 2022-04-12 LAB — CBG MONITORING, ED: Glucose-Capillary: 102 mg/dL — ABNORMAL HIGH (ref 70–99)

## 2022-04-12 MED ORDER — LACTATED RINGERS IV BOLUS
500.0000 mL | Freq: Once | INTRAVENOUS | Status: AC
  Administered 2022-04-12: 500 mL via INTRAVENOUS

## 2022-04-12 MED ORDER — HYDROCODONE-ACETAMINOPHEN 5-325 MG PO TABS: 1.0000 | ORAL_TABLET | Freq: Four times a day (QID) | ORAL | Status: DC | PRN

## 2022-04-12 MED ORDER — GABAPENTIN 300 MG PO CAPS
300.0000 mg | ORAL_CAPSULE | Freq: Every day | ORAL | Status: DC
  Administered 2022-04-13 – 2022-04-14 (×3): 300 mg via ORAL
  Filled 2022-04-12 (×3): qty 1

## 2022-04-12 MED ORDER — ACETAMINOPHEN 500 MG PO TABS
ORAL_TABLET | ORAL | Status: AC
  Filled 2022-04-12: qty 1

## 2022-04-12 MED ORDER — ONDANSETRON HCL 4 MG PO TABS: 4.0000 mg | ORAL_TABLET | Freq: Four times a day (QID) | ORAL | Status: DC | PRN

## 2022-04-12 MED ORDER — SODIUM CHLORIDE 0.9 % IV SOLN: INTRAVENOUS | Status: DC

## 2022-04-12 MED ORDER — IOHEXOL 350 MG/ML SOLN
80.0000 mL | Freq: Once | INTRAVENOUS | Status: AC | PRN
  Administered 2022-04-12: 80 mL via INTRAVENOUS

## 2022-04-12 MED ORDER — ASPIRIN EC 81 MG PO TBEC
162.0000 mg | DELAYED_RELEASE_TABLET | Freq: Every day | ORAL | Status: DC
  Administered 2022-04-13 – 2022-04-15 (×3): 162 mg via ORAL
  Filled 2022-04-12 (×3): qty 2

## 2022-04-12 MED ORDER — DUTASTERIDE 0.5 MG PO CAPS
0.5000 mg | ORAL_CAPSULE | Freq: Every day | ORAL | Status: DC
  Administered 2022-04-13 – 2022-04-15 (×3): 0.5 mg via ORAL
  Filled 2022-04-12 (×3): qty 1

## 2022-04-12 MED ORDER — ENZALUTAMIDE 80 MG PO TABS: 160.0000 mg | ORAL_TABLET | Freq: Every day | ORAL | Status: DC

## 2022-04-12 MED ORDER — SODIUM CHLORIDE (PF) 0.9 % IJ SOLN
INTRAMUSCULAR | Status: AC
  Filled 2022-04-12: qty 50

## 2022-04-12 MED ORDER — SODIUM CHLORIDE 0.9% FLUSH
3.0000 mL | Freq: Two times a day (BID) | INTRAVENOUS | Status: DC
  Administered 2022-04-12 – 2022-04-14 (×4): 3 mL via INTRAVENOUS

## 2022-04-12 MED ORDER — TRAZODONE HCL 50 MG PO TABS
50.0000 mg | ORAL_TABLET | Freq: Every day | ORAL | Status: DC
  Administered 2022-04-13 – 2022-04-14 (×3): 50 mg via ORAL
  Filled 2022-04-12 (×3): qty 1

## 2022-04-12 MED ORDER — ENOXAPARIN SODIUM 40 MG/0.4ML IJ SOSY
40.0000 mg | PREFILLED_SYRINGE | INTRAMUSCULAR | Status: DC
  Administered 2022-04-13 – 2022-04-15 (×3): 40 mg via SUBCUTANEOUS
  Filled 2022-04-12 (×3): qty 0.4

## 2022-04-12 MED ORDER — MAGNESIUM OXIDE -MG SUPPLEMENT 400 (240 MG) MG PO TABS
400.0000 mg | ORAL_TABLET | Freq: Two times a day (BID) | ORAL | Status: DC
  Administered 2022-04-13 – 2022-04-15 (×5): 400 mg via ORAL
  Filled 2022-04-12 (×5): qty 1

## 2022-04-12 MED ORDER — ONDANSETRON HCL 4 MG/2ML IJ SOLN
4.0000 mg | Freq: Four times a day (QID) | INTRAMUSCULAR | Status: DC | PRN
Start: 2022-04-12 — End: 2022-04-15

## 2022-04-12 MED ORDER — CYCLOSPORINE 0.05 % OP EMUL: 1.0000 [drp] | Freq: Every day | OPHTHALMIC | Status: DC | PRN

## 2022-04-12 MED ORDER — ACETAMINOPHEN 500 MG PO TABS
1000.0000 mg | ORAL_TABLET | Freq: Once | ORAL | Status: AC
  Administered 2022-04-12: 1000 mg via ORAL
  Filled 2022-04-12: qty 2

## 2022-04-12 MED ORDER — ACETAMINOPHEN 500 MG PO TABS: 500.0000 mg | ORAL_TABLET | ORAL | Status: DC

## 2022-04-12 MED ORDER — POLYVINYL ALCOHOL 1.4 % OP SOLN: 1.0000 [drp] | Freq: Every day | OPHTHALMIC | Status: DC | PRN

## 2022-04-12 MED ORDER — NETARSUDIL-LATANOPROST 0.02-0.005 % OP SOLN: 1.0000 [drp] | Freq: Every day | OPHTHALMIC | Status: DC

## 2022-04-12 MED ORDER — FUROSEMIDE 40 MG PO TABS
40.0000 mg | ORAL_TABLET | Freq: Every day | ORAL | Status: DC
Start: 2022-04-13 — End: 2022-04-15
  Administered 2022-04-13 – 2022-04-15 (×3): 40 mg via ORAL
  Filled 2022-04-12 (×3): qty 1

## 2022-04-12 NOTE — ED Provider Notes (Signed)
?[place] DEPT ?Provider Note ? ? ?CSN: [id number] ?Arrival date & time: 04/12/22  1451 ? ?  ? ?History ? ?Chief Complaint  ?Patient presents with  ? Altered Mental Status  ? ? ?Jacob Warner is a 88 y.o. male. ? ? ?Altered Mental Status ? ?  88 year old male with a hx of TIA, atrial fibrillation, anemia, sick sinus syndrome s/p [employer] PPM presenting to the ED with AMS. Pt was found at [facility] found unresponsive in the hall. Was hard to rouse, acutely confused, wandering off. His baseline mental status is normal, AAOx3 able to hold a conversation. Now, seems to still be falling asleep. Endorses a generalized feeling of malaise. No recent infectious symptoms. Currently being treated for metastatic prostate cancer, metastasized to his spine. Has been having headaches. Some concern for polypharmacy at his independent living facility. He does have a pacemaker. ? ?Home Medications ?Prior to Admission medications   ?Medication Sig Start Date End Date Taking? Authorizing Provider  ?acetaminophen (TYLENOL) 500 MG tablet Take 500-1,000 mg by mouth See admin instructions. 500 mg twice daily, 1000 mg at bedtime   Yes [provider]  ?aspirin EC 81 MG tablet Take 162 mg by mouth daily. Swallow whole.   Yes [provider]  ?Carboxymethylcellulose Sodium (ARTIFICIAL TEARS OP) Place 1 drop into both eyes daily as needed (for dry eyes).   Yes [provider]  ?dutasteride (AVODART) 0.5 MG capsule Take 0.5 mg by mouth daily.   Yes [provider]  ?enzalutamide (XTANDI) 80 MG tablet Take 2 tablets (160 mg total) by mouth daily. 04/02/22  Yes Brunetta Genera, MD  ?furosemide (LASIX) 40 MG tablet Take 40 mg by mouth daily. 03/09/20  Yes [provider]  ?gabapentin (NEURONTIN) 300 MG capsule Take 1 capsule (300 mg total) by mouth at bedtime. 12/27/20  Yes Hilts, Legrand Como, MD  ?HYDROcodone-acetaminophen (NORCO/VICODIN) 5-325 MG tablet  Take 1 tablet by mouth every 6 (six) hours as needed for moderate pain or severe pain. 02/10/22  Yes Brunetta Genera, MD  ?Magnesium Oxide 400 (240 Mg) MG TABS Take 1 tablet by mouth 2 (two) times daily. 02/03/18  Yes [provider]  ?RESTASIS 0.05 % ophthalmic emulsion Place 1 drop into both eyes daily as needed (for dry eyes). 03/03/16  Yes [provider]  ?ROCKLATAN 0.02-0.005 % SOLN Place 1 drop into both eyes at bedtime. 08/29/20  Yes [provider]  ?traZODone (DESYREL) 50 MG tablet Take 50 mg by mouth at bedtime.  03/23/18  Yes [provider]  ?vitamin B-12 (CYANOCOBALAMIN) 1000 MCG tablet Take 1,000 mcg by mouth at bedtime.   Yes [provider]  ?pantoprazole (PROTONIX) 40 MG tablet Take 1 tablet by mouth daily. ?Patient not taking: Reported on 04/12/2022 06/12/20   [provider]  ?   ? ?Allergies    ?Sulfamethoxazole-trimethoprim, Sulfa antibiotics, and Sulfacetamide sodium   ? ?Review of Systems   ?Review of Systems  ?Unable to perform ROS: Mental status change  ? ?Physical Exam ?Updated Vital Signs ?BP (!) 163/51   Pulse (!) 53   Temp 98.7 ?F (37.1 ?C) (Rectal)   Resp 14   SpO2 99%  ?Physical Exam ?Vitals and nursing note reviewed.  ?Constitutional:   ?   General: He is not in acute distress. ?   Appearance: He is well-developed.  ?HENT:  ?   Head: Normocephalic and atraumatic.  ?   Comments: No meningismus ?Eyes:  ?  Conjunctiva/sclera: Conjunctivae normal.  ?Cardiovascular:  ?   Rate and Rhythm: Regular rhythm. Bradycardia present.  ?Pulmonary:  ?   Effort: Pulmonary effort is normal. No respiratory distress.  ?   Breath sounds: Normal breath sounds.  ?Abdominal:  ?   Palpations: Abdomen is soft.  ?   Tenderness: There is no abdominal tenderness.  ?Musculoskeletal:     ?   General: Swelling present.  ?   Cervical back: Full passive range of motion without pain and neck supple.  ?   Comments: 1+ bilateral pitting edema   ?Skin: ?   General:  Skin is warm and dry.  ?   Capillary Refill: Capillary refill takes less than 2 seconds.  ?Neurological:  ?   Mental Status: He is alert.  ?   Comments: MENTAL STATUS EXAM:    ?Orientation: Alert and oriented to person and place. Disoriented to year. ?Memory: Cooperative, follows commands well.  ?Language: Speech is clear and language is normal.  ? ?CRANIAL NERVES:    ?CN 2 (Optic): Visual fields intact to confrontation.  ?CN 3,4,6 (EOM): Pupils equal and reactive to light. Full extraocular eye movement without nystagmus.  ?CN 5 (Trigeminal): Facial sensation is normal, no weakness of masticatory muscles.  ?CN 7 (Facial): No facial weakness or asymmetry.  ?CN 8 (Auditory): Auditory acuity grossly normal.  ?CN 9,10 (Glossophar): The uvula is midline, the palate elevates symmetrically.  ?CN 11 (spinal access): Normal sternocleidomastoid and trapezius strength.  ?CN 12 (Hypoglossal): The tongue is midline. No atrophy or fasciculations..  ? ?MOTOR:  Muscle Strength: 5/5RUE, 5/5LUE, 5/5RLE, 5/5LLE.  ? ?COORDINATION:   No tremor.  ? ?SENSATION:   Intact to light touch all four extremities. ? ?  ?Psychiatric:     ?   Mood and Affect: Mood normal.  ? ? ?ED Results / Procedures / Treatments   ?Labs ?(all labs ordered are listed, but only abnormal results are displayed) ?Labs Reviewed  ?COMPREHENSIVE METABOLIC PANEL - Abnormal; Notable for the following components:  ?    Result Value  ? Sodium 134 (*)   ? BUN 26 (*)   ? AST 14 (*)   ? All other components within normal limits  ?CBC WITH DIFFERENTIAL/PLATELET - Abnormal; Notable for the following components:  ? RBC 3.07 (*)   ? Hemoglobin 10.4 (*)   ? HCT 30.9 (*)   ? MCV 100.7 (*)   ? Platelets 145 (*)   ? All other components within normal limits  ?URINALYSIS, ROUTINE W REFLEX MICROSCOPIC - Abnormal; Notable for the following components:  ? Hgb urine dipstick SMALL (*)   ? All other components within normal limits  ?BLOOD GAS, VENOUS - Abnormal; Notable for the following  components:  ? pO2, Ven <31 (*)   ? All other components within normal limits  ?BRAIN NATRIURETIC PEPTIDE - Abnormal; Notable for the following components:  ? B Natriuretic Peptide 220.2 (*)   ? All other components within normal limits  ?D-DIMER, QUANTITATIVE - Abnormal; Notable for the following components:  ? D-Dimer, Quant 3.64 (*)   ? All other components within normal limits  ?CBG MONITORING, ED - Abnormal; Notable for the following components:  ? Glucose-Capillary 102 (*)   ? All other components within normal limits  ?I-STAT CHEM 8, ED - Abnormal; Notable for the following components:  ? BUN 25 (*)   ? Hemoglobin 10.5 (*)   ? HCT 31.0 (*)   ? All other components within normal limits  ?TROPONIN  I (HIGH SENSITIVITY) - Abnormal; Notable for the following components:  ? Troponin I (High Sensitivity) 63 (*)   ? All other components within normal limits  ?TROPONIN I (HIGH SENSITIVITY) - Abnormal; Notable for the following components:  ? Troponin I (High Sensitivity) 59 (*)   ? All other components within normal limits  ?CULTURE, BLOOD (ROUTINE X 2)  ?CULTURE, BLOOD (ROUTINE X 2)  ?RESP PANEL BY RT-PCR (FLU A&B, COVID) [id number]  ?AMMONIA  ?LACTIC ACID, PLASMA  ?RAPID URINE DRUG SCREEN, HOSP PERFORMED  ?ETHANOL  ? ? ?EKG ?EKG Interpretation ? ?Date/Time:  Saturday April 12 2022 16:31:18 EDT ?Ventricular Rate:  50 ?PR Interval:  300 ?QRS Duration: 144 ?QT Interval:  488 ?QTC Calculation: 445 ?R Axis:   -59 ?Text Interpretation: Sinus bradycardia ATRIAL PACED RHYTHM Prolonged PR interval Probable left atrial enlargement RBBB and LAFB Reconfirmed by Regan Lemming ([id number]) on 04/12/2022 6:04:37 PM ? ?Radiology ?DG Chest 1 View ? ?Result Date: 04/12/2022 ?CLINICAL DATA:  Pt from independent living facility. Staff called his son this a.m. stating he was hard to rouse this morning and was found sitting on a couch. Staff stated pt continued to fall asleep while they were talking to him. Son states pt is currently undergoing tx  at the cancer center for metastatic prostate cancer but has been doing reportedly well. EXAM: CHEST  1 VIEW COMPARISON:  11/04/2019 and older exams. FINDINGS: Cardiac silhouette top-normal in size. No mediastina

## 2022-04-12 NOTE — H&P (Addendum)
PT ?History and Physical  ? ? ?Patient: Jacob Warner PJA:[id number] DOB: [birth year] ?DOA: 04/12/2022 ?DOS: the patient was seen and examined on 04/12/2022 ?PCP: [name], MD  ?Patient coming from: ALF/ILF ? ?Chief Complaint:  ?Chief Complaint  ?Patient presents with  ? Altered Mental Status  ? ?HPI: Jacob Warner is a 88 y.o. male with medical history significant of Prostate Cancer, atrial fibrillation, TIAs, sick sinus syndrome status post pacemaker placement, chronic bradycardia and GERD, essential hypertension who resides at [facility].  Patient was found unresponsive in the hall earlier this morning.  His son was called and informed.  He has been in and out of consciousness.  Suspected to have had syncopal episode versus seizure versus polypharmacy.  Brought to the ER where at this point patient is awake communicating but seems to be drowsy.  He could not explain what happened.  Suspected to have had a syncopal episode and patient being admitted to the hospital for evaluation and treatment.  He is unable to say what happened.  Did not remember hitting his head.  He is however off from his baseline. ? ?Review of Systems: As mentioned in the history of present illness. All other systems reviewed and are negative. ?Past Medical History:  ?Diagnosis Date  ? A-fib ([place])   ? Alopecia   ? Anemia   ? Bradycardia   ? Cancer [facility])   ? Cherry angioma   ? Echocardiogram   ? Echo 11/19: Mild concentric LVH, EF 50-55, normal wall motion, grade 1 diastolic dysfunction, mild AI, MAC, mild MR, normal RVSF, moderate TR, PASP 36  ? GERD (gastroesophageal reflux disease)   ? Glaucoma   ? Hypertension   ? Nuclear stress test   ? Nuclear stress test 11/19: not gated, inf and apical defect c/w small scar and/or soft tissue attenuation  ? Pelvic fracture (HCC)   ? Right BBB/left ant fasc block   ? Sick sinus syndrome ([place])   ? TIA (transient ischemic attack)   ? Xeroderma   ? ?Past Surgical History:  ?Procedure Laterality  Date  ? APPENDECTOMY    ? CERVICAL SPINE SURGERY    ? EXPLORATORY LAPAROTOMY    ? LITHOTRIPSY    ? PACEMAKER PLACEMENT    ? PPM GENERATOR CHANGEOUT N/A 05/10/2020  ? Procedure: PPM GENERATOR CHANGEOUT;  Surgeon: [name], MD;  Location: [place] CV LAB;  Service: Cardiovascular;  Laterality: N/A;  ? SPINAL CORD STIMULATOR IMPLANT Bilateral 2018  ? [name], MD PhD, [facility]  ? ?Social History:  reports that he has never smoked. He has never used smokeless tobacco. He reports that he does not drink alcohol and does not use drugs. ? ?Allergies  ?Allergen Reactions  ? Sulfamethoxazole-Trimethoprim   ?  Other reaction(s): rash  ? Sulfa Antibiotics Hives and Rash  ?  Bactrim ?Other reaction(s): rash  ? Sulfacetamide Sodium Hives and Rash  ?  Bactrim  ? ? ?Family History  ?Problem Relation Age of Onset  ? Brain cancer Sister   ? ? ?Prior to Admission medications   ?Medication Sig Start Date End Date Taking? Authorizing Provider  ?acetaminophen (TYLENOL) 500 MG tablet Take 500-1,000 mg by mouth See admin instructions. 500 mg twice daily, 1000 mg at bedtime   Yes [provider]  ?aspirin EC 81 MG tablet Take 162 mg by mouth daily. Swallow whole.   Yes [provider]  ?Carboxymethylcellulose Sodium (ARTIFICIAL TEARS OP) Place 1 drop into  both eyes daily as needed (for dry eyes).   Yes [provider]  ?dutasteride (AVODART) 0.5 MG capsule Take 0.5 mg by mouth daily.   Yes [provider]  ?enzalutamide (XTANDI) 80 MG tablet Take 2 tablets (160 mg total) by mouth daily. 04/02/22  Yes Brunetta Genera, MD  ?furosemide (LASIX) 40 MG tablet Take 40 mg by mouth daily. 03/09/20  Yes [provider]  ?gabapentin (NEURONTIN) 300 MG capsule Take 1 capsule (300 mg total) by mouth at bedtime. 12/27/20  Yes Hilts, Legrand Como, MD  ?HYDROcodone-acetaminophen (NORCO/VICODIN) 5-325 MG tablet Take 1 tablet by mouth every 6 (six) hours as needed for  moderate pain or severe pain. 02/10/22  Yes Brunetta Genera, MD  ?Magnesium Oxide 400 (240 Mg) MG TABS Take 1 tablet by mouth 2 (two) times daily. 02/03/18  Yes [provider]  ?RESTASIS 0.05 % ophthalmic emulsion Place 1 drop into both eyes daily as needed (for dry eyes). 03/03/16  Yes [provider]  ?ROCKLATAN 0.02-0.005 % SOLN Place 1 drop into both eyes at bedtime. 08/29/20  Yes [provider]  ?traZODone (DESYREL) 50 MG tablet Take 50 mg by mouth at bedtime.  03/23/18  Yes [provider]  ?vitamin B-12 (CYANOCOBALAMIN) 1000 MCG tablet Take 1,000 mcg by mouth at bedtime.   Yes [provider]  ?pantoprazole (PROTONIX) 40 MG tablet Take 1 tablet by mouth daily. ?Patient not taking: Reported on 04/12/2022 06/12/20   [provider]  ? ? ?Physical Exam: ?Vitals:  ? 04/12/22 2200 04/12/22 2230 04/12/22 2246 04/12/22 2316  ?BP: 129/83 (!) 153/60  (!) 156/67  ?Pulse: (!) 53 (!) 50  (!) 55  ?Resp: _0 ?Temp:   98.6 ?F (37 ?C) 98 ?F (36.7 ?C)  ?TempSrc:   Oral Oral  ?SpO2: 100% 98%  99%  ? ?General: Frail, weak, no distress ?HEENT: PERRL, no pallor no jaundice ?Respiratory: Good air entry bilaterally no wheeze or rales or crackles ?Cardiovascular system: Regular rate and rhythm ?Abdomen: Soft nontender with positive bowel sounds. ?Extremities: No edema sinus or clubbing ?Skin exam: Multiple bruises ?Neuro: Awake alert slightly drowsy no distress ? ?Data Reviewed: ? ? ?Results reviewed with delta troponin of 63 and then 59 and 3 hours hemoglobin is 10.4.  D-dimer 3.64 urinalysis essentially negative.  CT angiogram of the chest shows no PE.  There is extensive sclerotic osseous metastatic disease throughout consistent with history of prostate cancer. ? ?Assessment and Plan: ? ?#1 syncope: Patient appears to have had syncopal episode versus polypharmacy.  We will admit the patient.  O2 consult.  Echocardiogram.  Consider possible MRI of the brain if indicated.   Suspicion is for either seizure or cardiac causes patient with a persistent bradycardia heart rate in the 50s.  It could also be polypharmacy.  Place patient on telemetry. ? ?#2 essential hypertension: No ongoing treatment except for Lasix.  We will continue with it. ? ?#3 metastatic prostate cancer: Continue treatment per oncology ? ?#4 atrial fibrillation: Rate is controlled and bradycardic event.  No anticoagulation ? ?#5 altered mental status: Appears to be acute delirium due to acute illness.  Patient may benefit from EEG to rule out a seizure if this returns or persists. ? ? ? Advance Care Planning:   Code Status: Full Code Full Code ? ?Consults: None ? ?Family Communication: Son over the phone ? ?Severity of Illness: ?The appropriate patient status for this patient is OBSERVATION. Observation status is judged to  be reasonable and necessary in order to provide the required intensity of service to ensure the patient's safety. The patient's presenting symptoms, physical exam findings, and initial radiographic and laboratory data in the context of their medical condition is felt to place them at decreased risk for further clinical deterioration. Furthermore, it is anticipated that the patient will be medically stable for discharge from the hospital within 2 midnights of admission.  ? ?Author: ?Barbette Merino, MD ?04/12/2022 11:41 PM ? ?For on call review [email or web address].  ?

## 2022-04-12 NOTE — ED Notes (Signed)
Floor request 10 mins before patient can be transferred from ED.  ?

## 2022-04-12 NOTE — ED Notes (Signed)
ED TO INPATIENT HANDOFF REPORT ? ?ED Nurse Name and Phone #: [phone] Erick Colace, RN  ? ?S ?Name/Age/Gender ?[name] ?88 y.o. ?male ?Room/Bed: EX52/WU13 ? ?Code Status ?  Code Status: Not on file ? ?Home/SNF/Other ?Skilled nursing facility ?Patient oriented to: self, place, and situation ?Is this baseline? Yes  ? ?Triage Complete: Triage complete  ?Chief Complaint ?Syncope and collapse [R55] ? ?Triage Note ?Pt from independent living facility.  Staff called his son this a.m. stating he was hard to rouse this morning and was found sitting on a couch.  Staff stated pt continued to fall asleep while they were talking to him. Son states pt is currently undergoing tx at the cancer center for metastatic prostate cancer but has been doing reportedly well.  Pt has no complaints other than back pain but is very somnolent in triage.   ? ?Allergies ?Allergies  ?Allergen Reactions  ? Sulfamethoxazole-Trimethoprim   ?  Other reaction(s): rash  ? Sulfa Antibiotics Hives and Rash  ?  Bactrim ?Other reaction(s): rash  ? Sulfacetamide Sodium Hives and Rash  ?  Bactrim  ? ? ?Level of Care/Admitting Diagnosis ?ED Disposition   ? ? ED Disposition  ?Admit  ? Condition  ?--  ? Comment  ?Hospital Area: [facility] [[id number]] ? Level of Care: Telemetry [5] ? Admit to tele based on following criteria: Complex arrhythmia (Bradycardia/Tachycardia) ? May place patient in observation at [facility] or [place] if equivalent level of care is available:: Yes ? Covid Evaluation: Asymptomatic - no recent exposure (last 10 days) testing not required ? Diagnosis: Syncope and collapse [780.2.ICD-9-CM] ? Admitting Physician: Elwyn Reach [[id number]] ? Attending Physician: Elwyn Reach [[id number]] ?  ?  ? ?  ? ? ?B ?Medical/Surgery History ?Past Medical History:  ?Diagnosis Date  ? A-fib ([place])   ? Alopecia   ? Anemia   ? Bradycardia   ? Cancer [facility])   ? Cherry angioma   ? Echocardiogram   ? Echo 11/19: Mild concentric LVH,  EF 50-55, normal wall motion, grade 1 diastolic dysfunction, mild AI, MAC, mild MR, normal RVSF, moderate TR, PASP 36  ? GERD (gastroesophageal reflux disease)   ? Glaucoma   ? Hypertension   ? Nuclear stress test   ? Nuclear stress test 11/19: not gated, inf and apical defect c/w small scar and/or soft tissue attenuation  ? Pelvic fracture ([facility])   ? Right BBB/left ant fasc block   ? Sick sinus syndrome ([place])   ? TIA (transient ischemic attack)   ? Xeroderma   ? ?Past Surgical History:  ?Procedure Laterality Date  ? APPENDECTOMY    ? CERVICAL SPINE SURGERY    ? EXPLORATORY LAPAROTOMY    ? LITHOTRIPSY    ? PACEMAKER PLACEMENT    ? PPM GENERATOR CHANGEOUT N/A 05/10/2020  ? Procedure: PPM GENERATOR CHANGEOUT;  Surgeon: [name], MD;  Location: [facility];  Service: Cardiovascular;  Laterality: N/A;  ? SPINAL CORD STIMULATOR IMPLANT Bilateral 2018  ? [name], MD PhD, [facility]  ?  ? ?A ?IV Location/Drains/Wounds ?Patient Lines/Drains/Airways Status   ? ? Active Line/Drains/Airways   ? ? Name Placement date Placement time Site Days  ? Peripheral IV 04/12/22 20 G 1" Right;Lateral Forearm 04/12/22  1657  Forearm  less than 1  ? Peripheral IV 04/12/22 20 G 1" Left Antecubital 04/12/22  1635  Antecubital  less than 1  ? ?  ?  ? ?  ? ? ?  Intake/Output Last 24 hours ?No intake or output data in the 24 hours ending 04/12/22 2057 ? ?Labs/Imaging ?Results for orders placed or performed during the hospital encounter of 04/12/22 (from the past 48 hour(s))  ?Comprehensive metabolic panel     Status: Abnormal  ? Collection Time: 04/12/22  4:35 PM  ?Result Value Ref Range  ? Sodium 134 (L) 135 - 145 mmol/L  ? Potassium 4.6 3.5 - 5.1 mmol/L  ? Chloride 101 98 - 111 mmol/L  ? CO2 27 22 - 32 mmol/L  ? Glucose, Bld 96 70 - 99 mg/dL  ?  Comment: Glucose reference range applies only to samples taken after fasting for at least 8 hours.  ? BUN 26 (H) 8 - 23 mg/dL  ? Creatinine, Ser 1.10 0.61  - 1.24 mg/dL  ? Calcium 9.3 8.9 - 10.3 mg/dL  ? Total Protein 6.6 6.5 - 8.1 g/dL  ? Albumin 4.3 3.5 - 5.0 g/dL  ? AST 14 (L) 15 - 41 U/L  ? ALT 9 0 - 44 U/L  ? Alkaline Phosphatase 68 38 - 126 U/L  ? Total Bilirubin 0.6 0.3 - 1.2 mg/dL  ? GFR, Estimated >60 >60 mL/min  ?  Comment: (NOTE) ?Calculated using the CKD-EPI Creatinine Equation (2021) ?  ? Anion gap 6 5 - 15  ?  Comment: Performed at [facility], [place] [address], [place], [place] [address]  ?CBC WITH DIFFERENTIAL     Status: Abnormal  ? Collection Time: 04/12/22  4:35 PM  ?Result Value Ref Range  ? WBC 4.9 4.0 - 10.5 K/uL  ? RBC 3.07 (L) 4.22 - 5.81 MIL/uL  ? Hemoglobin 10.4 (L) 13.0 - 17.0 g/dL  ? HCT 30.9 (L) 39.0 - 52.0 %  ? MCV 100.7 (H) 80.0 - 100.0 fL  ? MCH 33.9 26.0 - 34.0 pg  ? MCHC 33.7 30.0 - 36.0 g/dL  ? RDW 13.9 11.5 - 15.5 %  ? Platelets 145 (L) 150 - 400 K/uL  ?  Comment: SPECIMEN CHECKED FOR CLOTS ?REPEATED TO VERIFY ?PLATELET COUNT CONFIRMED BY SMEAR ?  ? nRBC 0.0 0.0 - 0.2 %  ? Neutrophils Relative % 77 %  ? Neutro Abs 3.7 1.7 - 7.7 K/uL  ? Lymphocytes Relative 13 %  ? Lymphs Abs 0.7 0.7 - 4.0 K/uL  ? Monocytes Relative 9 %  ? Monocytes Absolute 0.5 0.1 - 1.0 K/uL  ? Eosinophils Relative 1 %  ? Eosinophils Absolute 0.1 0.0 - 0.5 K/uL  ? Basophils Relative 0 %  ? Basophils Absolute 0.0 0.0 - 0.1 K/uL  ? Immature Granulocytes 0 %  ? Abs Immature Granulocytes 0.01 0.00 - 0.07 K/uL  ?  Comment: Performed at [facility], [place] [address], [place], [place] [address]  ?Ammonia     Status: None  ? Collection Time: 04/12/22  4:35 PM  ?Result Value Ref Range  ? Ammonia 12 9 - 35 umol/L  ?  Comment: Performed at [facility], [place] [address] [place], [place] [address]  ?Lactic acid, plasma     Status: None  ? Collection Time: 04/12/22  4:35 PM  ?Result Value Ref Range  ? Lactic Acid, Venous 1.2 0.5 - 1.9 mmol/L  ?  Comment: Performed at [facility], [place] [address],  [place], [place] [address]  ?Ethanol     Status: None  ? Collection Time: 04/12/22  4:35 PM  ?Result Value Ref Range  ? Alcohol, Ethyl (B) <  10 <10 mg/dL  ?  Comment: (NOTE) ?Lowest detectable limit for serum alcohol is 10 mg/dL. ? ?For medical purposes only. ?Performed at [facility], [place] [address] ?[place], [place] [address] ?  ?Blood gas, venous     Status: Abnormal  ? Collection Time: 04/12/22  4:35 PM  ?Result Value Ref Range  ? pH, Ven 7.38 7.25 - 7.43  ? pCO2, Ven 47 44 - 60 mmHg  ? pO2, Ven <31 (LL) 32 - 45 mmHg  ?  Comment: CRITICAL RESULT CALLED TO, READ BACK BY AND VERIFIED WITH: ?CALLED TO CANDIS K RN _0  ASHA B  ?  ? Bicarbonate 27.8 20.0 - 28.0 mmol/L  ? Acid-Base Excess 2.0 0.0 - 2.0 mmol/L  ? O2 Saturation 44.2 %  ? Patient temperature 37.0   ?  Comment: Performed at [facility], [place] [address] [place], [place] [address]  ?Troponin I (High Sensitivity)     Status: Abnormal  ? Collection Time: 04/12/22  4:35 PM  ?Result Value Ref Range  ? Troponin I (High Sensitivity) 63 (H) <18 ng/L  ?  Comment: (NOTE) ?Elevated high sensitivity troponin I (hsTnI) values and significant  ?changes across serial measurements may suggest ACS but many other  ?chronic and acute conditions are known to elevate hsTnI results.  ?Refer to the "Links" section for chest pain algorithms and additional  ?guidance. ?Performed at [facility], [place] [address] ?[place], [place] [address] ?  ?Brain natriuretic peptide     Status: Abnormal  ? Collection Time: 04/12/22  4:35 PM  ?Result Value Ref Range  ? B Natriuretic Peptide 220.2 (H) 0.0 - 100.0 pg/mL  ?  Comment: Performed at [facility], [place] [address], [place], [place] [address]  ?D-dimer, quantitative     Status: Abnormal  ? Collection Time: 04/12/22  4:35 PM  ?Result Value Ref Range  ? D-Dimer, Quant 3.64 (H) 0.00 - 0.50 ug/mL-FEU  ?  Comment: (NOTE) ?At the manufacturer cut-off value of 0.5 ?g/mL FEU,  this assay has a ?negative predictive value of 95-100%.This assay is intended for use ?in conjunction with a clinical pretest probability (PTP) assessment ?model to exclude pulmonary embolism (PE) and de

## 2022-04-12 NOTE — ED Triage Notes (Signed)
Pt from independent living facility.  Staff called his son this a.m. stating he was hard to rouse this morning and was found sitting on a couch.  Staff stated pt continued to fall asleep while they were talking to him. Son states pt is currently undergoing tx at the cancer center for metastatic prostate cancer but has been doing reportedly well.  Pt has no complaints other than back pain but is very somnolent in triage.  ?

## 2022-04-12 NOTE — ED Notes (Signed)
Jacob Warner. Gaye Pollack contacted, and verified that PM is in good condition. ?

## 2022-04-13 ENCOUNTER — Encounter (HOSPITAL_COMMUNITY): Payer: Self-pay | Admitting: Internal Medicine

## 2022-04-13 ENCOUNTER — Observation Stay (HOSPITAL_BASED_OUTPATIENT_CLINIC_OR_DEPARTMENT_OTHER): Payer: Medicare PPO

## 2022-04-13 DIAGNOSIS — I480 Paroxysmal atrial fibrillation: Secondary | ICD-10-CM | POA: Diagnosis present

## 2022-04-13 DIAGNOSIS — C7951 Secondary malignant neoplasm of bone: Secondary | ICD-10-CM | POA: Diagnosis present

## 2022-04-13 DIAGNOSIS — Z808 Family history of malignant neoplasm of other organs or systems: Secondary | ICD-10-CM | POA: Diagnosis not present

## 2022-04-13 DIAGNOSIS — R412 Retrograde amnesia: Secondary | ICD-10-CM | POA: Diagnosis present

## 2022-04-13 DIAGNOSIS — Z950 Presence of cardiac pacemaker: Secondary | ICD-10-CM | POA: Diagnosis not present

## 2022-04-13 DIAGNOSIS — Z7982 Long term (current) use of aspirin: Secondary | ICD-10-CM | POA: Diagnosis not present

## 2022-04-13 DIAGNOSIS — R55 Syncope and collapse: Secondary | ICD-10-CM | POA: Diagnosis present

## 2022-04-13 DIAGNOSIS — Z79899 Other long term (current) drug therapy: Secondary | ICD-10-CM | POA: Diagnosis not present

## 2022-04-13 DIAGNOSIS — C61 Malignant neoplasm of prostate: Secondary | ICD-10-CM | POA: Diagnosis present

## 2022-04-13 DIAGNOSIS — G9341 Metabolic encephalopathy: Secondary | ICD-10-CM | POA: Diagnosis present

## 2022-04-13 DIAGNOSIS — L89151 Pressure ulcer of sacral region, stage 1: Secondary | ICD-10-CM | POA: Diagnosis present

## 2022-04-13 DIAGNOSIS — I495 Sick sinus syndrome: Secondary | ICD-10-CM | POA: Diagnosis present

## 2022-04-13 DIAGNOSIS — Z8673 Personal history of transient ischemic attack (TIA), and cerebral infarction without residual deficits: Secondary | ICD-10-CM | POA: Diagnosis not present

## 2022-04-13 DIAGNOSIS — I5032 Chronic diastolic (congestive) heart failure: Secondary | ICD-10-CM | POA: Diagnosis present

## 2022-04-13 DIAGNOSIS — K219 Gastro-esophageal reflux disease without esophagitis: Secondary | ICD-10-CM | POA: Diagnosis present

## 2022-04-13 DIAGNOSIS — Z20822 Contact with and (suspected) exposure to covid-19: Secondary | ICD-10-CM | POA: Diagnosis present

## 2022-04-13 DIAGNOSIS — I110 Hypertensive heart disease with heart failure: Secondary | ICD-10-CM | POA: Diagnosis present

## 2022-04-13 DIAGNOSIS — R531 Weakness: Secondary | ICD-10-CM

## 2022-04-13 DIAGNOSIS — G894 Chronic pain syndrome: Secondary | ICD-10-CM | POA: Diagnosis present

## 2022-04-13 DIAGNOSIS — L8990 Pressure ulcer of unspecified site, unspecified stage: Secondary | ICD-10-CM | POA: Insufficient documentation

## 2022-04-13 DIAGNOSIS — D696 Thrombocytopenia, unspecified: Secondary | ICD-10-CM | POA: Diagnosis present

## 2022-04-13 DIAGNOSIS — D509 Iron deficiency anemia, unspecified: Secondary | ICD-10-CM | POA: Diagnosis present

## 2022-04-13 LAB — CBC
HCT: 31.4 % — ABNORMAL LOW (ref 39.0–52.0)
Hemoglobin: 10.3 g/dL — ABNORMAL LOW (ref 13.0–17.0)
MCH: 33 pg (ref 26.0–34.0)
MCHC: 32.8 g/dL (ref 30.0–36.0)
MCV: 100.6 fL — ABNORMAL HIGH (ref 80.0–100.0)
Platelets: 124 10*3/uL — ABNORMAL LOW (ref 150–400)
RBC: 3.12 MIL/uL — ABNORMAL LOW (ref 4.22–5.81)
RDW: 13.7 % (ref 11.5–15.5)
WBC: 4.2 10*3/uL (ref 4.0–10.5)
nRBC: 0 % (ref 0.0–0.2)

## 2022-04-13 LAB — ECHOCARDIOGRAM COMPLETE
AR max vel: 1.65 cm2
AV Area VTI: 1.57 cm2
AV Area mean vel: 1.27 cm2
AV Mean grad: 11.8 mmHg
AV Peak grad: 21.4 mmHg
Ao pk vel: 2.31 m/s
Area-P 1/2: 2.45 cm2
Height: 67 in
MV M vel: 2.94 m/s
MV Peak grad: 34.6 mmHg
S' Lateral: 2.6 cm
Weight: 2787.2 oz

## 2022-04-13 LAB — COMPREHENSIVE METABOLIC PANEL
ALT: 7 U/L (ref 0–44)
AST: 13 U/L — ABNORMAL LOW (ref 15–41)
Albumin: 4 g/dL (ref 3.5–5.0)
Alkaline Phosphatase: 68 U/L (ref 38–126)
Anion gap: 8 (ref 5–15)
BUN: 22 mg/dL (ref 8–23)
CO2: 25 mmol/L (ref 22–32)
Calcium: 9.1 mg/dL (ref 8.9–10.3)
Chloride: 103 mmol/L (ref 98–111)
Creatinine, Ser: 0.9 mg/dL (ref 0.61–1.24)
GFR, Estimated: >60 mL/min (ref >60)
Glucose, Bld: 95 mg/dL (ref 70–99)
Potassium: 4.2 mmol/L (ref 3.5–5.1)
Sodium: 136 mmol/L (ref 135–145)
Total Bilirubin: 1.1 mg/dL (ref 0.3–1.2)
Total Protein: 6.3 g/dL — ABNORMAL LOW (ref 6.5–8.1)

## 2022-04-13 LAB — GLUCOSE, CAPILLARY: Glucose-Capillary: 93 mg/dL (ref 70–99)

## 2022-04-13 LAB — LACTATE DEHYDROGENASE: LDH: 148 U/L (ref 98–192)

## 2022-04-13 LAB — CK: Total CK: 77 U/L (ref 49–397)

## 2022-04-13 MED ORDER — ACETAMINOPHEN 500 MG PO TABS
1000.0000 mg | ORAL_TABLET | Freq: Every day | ORAL | Status: DC
  Administered 2022-04-13 – 2022-04-14 (×3): 1000 mg via ORAL
  Filled 2022-04-13 (×3): qty 2

## 2022-04-13 MED ORDER — ACETAMINOPHEN 500 MG PO TABS
500.0000 mg | ORAL_TABLET | Freq: Two times a day (BID) | ORAL | Status: DC
  Administered 2022-04-13 – 2022-04-15 (×5): 500 mg via ORAL
  Filled 2022-04-13 (×5): qty 1

## 2022-04-13 NOTE — Assessment & Plan Note (Signed)
Sinus bradycardia, with PMon board. ?Lowest is 39 per telemetry, patient asymptomatic  ?

## 2022-04-13 NOTE — Progress Notes (Addendum)
?Progress Note ? ? ?Patient: Jacob Warner LPF:[id number] DOB: [birth year] DOA: 04/12/2022     0 ?DOS: the patient was seen and examined on 04/13/2022 ?  ?Brief hospital course: ? 88 y.o. male with medical history significant of Prostate Cancer, atrial fibrillation, TIAs, sick sinus syndrome status post pacemaker placement, chronic bradycardia and GERD, essential hypertension who resides at [facility].  Patient was found unresponsive in the hall earlier this morning.  His son was called and informed.  He has been in and out of consciousness.  Suspected to have had syncopal episode versus seizure versus polypharmacy.  Brought to the ER where at this point patient is awake communicating but seems to be drowsy.  He could not explain what happened.  Suspected to have had a syncopal episode and patient being admitted to the hospital for evaluation and treatment.  He is unable to say what happened.  Did not remember hitting his head.  He is however off from his baseline ? ?4/16 MRI unsure if can do given PM and stimulator in place, son will bring in stimulator remote to turn off and cardiology team to verify PM compatibility during weekday ? ?Assessment and Plan: ?* Syncope and collapse ?TIA/CVA, no residual weakness ?Get Echo ?Get Orthostatic measures ?PT evaluation ?MRI brain ordered but need to verify PM compatibility and stimulator needs to be turned off. ?Unsure seizure no post ictal activity, incontinence reported HOWEVER, family reported "staring spell" ?Hypoglycemia? No notes of recorded BG in medic report ?No PE per CT A Chest ?Continue telemetry, obtain Get Echo ?Get Orthostatic measures ?PT evaluation ?Consider neurology evaluation. ?EEG pending ? ? ?Pressure injury of skin ?POA, wound care in place ? ?Bradycardia ?Sinus bradycardia, with PMon board. ?Lowest is 39 per telemetry, patient asymptomatic  ? ? ?  ?#2 essential hypertension: No ongoing treatment except for Lasix.  We will continue with it. ?   ?#3 metastatic prostate cancer: Continue treatment per oncology ?  ?#4 atrial fibrillation: Rate is controlled and bradycardic event.  No anticoagulation ?  ?#5 altered mental status: Appears to be acute delirium due to acute illness.  Patient may benefit from EEG to rule out a seizure if this returns or persists. ?  ? ?  ? ?Subjective:  speaking in full sentences, denies CP, myalgias. He does remember being "down" for "19 hours" and "knew family was going to call me". ?Unsure contributing factors, but denies dizziness/palpitations, acute changes in vision. Some retrograde amnesia to event.  ? ?Physical Exam: ?Vitals:  ? 04/13/22 0400 04/13/22 0407 04/13/22 0738 04/13/22 1134  ?BP:  (!) 172/67 (!) 151/55 (!) 156/50  ?Pulse:  (!) 54 (!) 51 (!) 49  ?Resp:  '20 18 18  '$ ?Temp:  98.7 ?F (37.1 ?C) 98.6 ?F (37 ?C) 97.9 ?F (36.6 ?C)  ?TempSrc:  Oral Oral Oral  ?SpO2:  99% 96% 97%  ?Weight: 79 kg     ?Height: '5\' 7"'$  (1.702 m)     ? ?General: Frail, weak, no distress ?HEENT: PERRL, no pallor no jaundice ?Respiratory: Good air entry bilaterally no wheeze or rales or crackles ?Cardiovascular system: Regular rate and rhythm ?Abdomen: Soft nontender with positive bowel sounds. ?Extremities: No edema sinus or clubbing ?Skin exam: Multiple bruises ?Neuro: Awake alert slightly drowsy no distress ?Data Reviewed: ? ?Results for orders placed or performed during the hospital encounter of 04/12/22 (from the past 24 hour(s))  ?Culture, blood (Routine X 2) w Reflex to ID Panel     Status: None (Preliminary result)  ?  Collection Time: 04/12/22  4:30 PM  ? Specimen: BLOOD  ?Result Value Ref Range  ? Specimen Description    ?  BLOOD SITE NOT SPECIFIED ?Performed at [facility], [place] [address] [place], [place] [address] ?  ? Special Requests    ?  BOTTLES DRAWN AEROBIC AND ANAEROBIC Blood Culture results may not be optimal due to an excessive volume of blood received in culture bottles ?Performed at [facility], [place] [address], [place], [place] [address] ?  ? Culture    ?  NO GROWTH < 12 HOURS ?Performed at [facility], [place] [address] [place], [place] [address] ?  ? Report Status PENDING   ?Comprehensive metabolic panel     Status: Abnormal  ? Collection Time: 04/12/22  4:35 PM  ?Result Value Ref Range  ? Sodium 134 (L) 135 - 145 mmol/L  ? Potassium 4.6 3.5 - 5.1 mmol/L  ? Chloride 101 98 - 111 mmol/L  ? CO2 27 22 - 32 mmol/L  ? Glucose, Bld 96 70 - 99 mg/dL  ? BUN 26 (H) 8 - 23 mg/dL  ? Creatinine, Ser 1.10 0.61 - 1.24 mg/dL  ? Calcium 9.3 8.9 - 10.3 mg/dL  ? Total Protein 6.6 6.5 - 8.1 g/dL  ? Albumin 4.3 3.5 - 5.0 g/dL  ? AST 14 (L) 15 - 41 U/L  ? ALT 9 0 - 44 U/L  ? Alkaline Phosphatase 68 38 - 126 U/L  ? Total Bilirubin 0.6 0.3 - 1.2 mg/dL  ? GFR, Estimated >60 >60 mL/min  ? Anion gap 6 5 - 15  ?CBC WITH DIFFERENTIAL     Status: Abnormal  ? Collection Time: 04/12/22  4:35 PM  ?Result Value Ref Range  ? WBC 4.9 4.0 - 10.5 K/uL  ? RBC 3.07 (L) 4.22 - 5.81 MIL/uL  ? Hemoglobin 10.4 (L) 13.0 - 17.0 g/dL  ? HCT 30.9 (L) 39.0 - 52.0 %  ? MCV 100.7 (H) 80.0 - 100.0 fL  ? MCH 33.9 26.0 - 34.0 pg  ? MCHC 33.7 30.0 - 36.0 g/dL  ? RDW 13.9 11.5 - 15.5 %  ? Platelets 145 (L) 150 - 400 K/uL  ? nRBC 0.0 0.0 - 0.2 %  ? Neutrophils Relative % 77 %  ? Neutro Abs 3.7 1.7 - 7.7 K/uL  ? Lymphocytes Relative 13 %  ? Lymphs Abs 0.7 0.7 - 4.0 K/uL  ? Monocytes Relative 9 %  ? Monocytes Absolute 0.5 0.1 - 1.0 K/uL  ? Eosinophils Relative 1 %  ? Eosinophils Absolute 0.1 0.0 - 0.5 K/uL  ? Basophils Relative 0 %  ? Basophils Absolute 0.0 0.0 - 0.1 K/uL  ? Immature Granulocytes 0 %  ? Abs Immature Granulocytes 0.01 0.00 - 0.07 K/uL  ?Ammonia     Status: None  ? Collection Time: 04/12/22  4:35 PM  ?Result Value Ref Range  ? Ammonia 12 9 - 35 umol/L  ?Lactic acid, plasma     Status: None  ? Collection Time: 04/12/22  4:35 PM  ?Result Value Ref Range  ? Lactic Acid, Venous 1.2 0.5 - 1.9 mmol/L  ?Ethanol     Status: None  ?  Collection Time: 04/12/22  4:35 PM  ?Result Value Ref Range  ? Alcohol, Ethyl (B) <10 <10 mg/dL  ?Blood gas, venous     Status: Abnormal  ? Collection Time: 04/12/22  4:35 PM  ?Result Value Ref Range  ? pH, Ven 7.38  7.25 - 7.43  ? pCO2, Ven 47 44 - 60 mmHg  ? pO2, Ven <31 (LL) 32 - 45 mmHg  ? Bicarbonate 27.8 20.0 - 28.0 mmol/L  ? Acid-Base Excess 2.0 0.0 - 2.0 mmol/L  ? O2 Saturation 44.2 %  ? Patient temperature 37.0   ?Troponin I (High Sensitivity)     Status: Abnormal  ? Collection Time: 04/12/22  4:35 PM  ?Result Value Ref Range  ? Troponin I (High Sensitivity) 63 (H) <18 ng/L  ?Brain natriuretic peptide     Status: Abnormal  ? Collection Time: 04/12/22  4:35 PM  ?Result Value Ref Range  ? B Natriuretic Peptide 220.2 (H) 0.0 - 100.0 pg/mL  ?D-dimer, quantitative     Status: Abnormal  ? Collection Time: 04/12/22  4:35 PM  ?Result Value Ref Range  ? D-Dimer, Quant 3.64 (H) 0.00 - 0.50 ug/mL-FEU  ?CBG monitoring, ED     Status: Abnormal  ? Collection Time: 04/12/22  4:47 PM  ?Result Value Ref Range  ? Glucose-Capillary 102 (H) 70 - 99 mg/dL  ?I-Stat Chem 8, ED     Status: Abnormal  ? Collection Time: 04/12/22  4:48 PM  ?Result Value Ref Range  ? Sodium 135 135 - 145 mmol/L  ? Potassium 4.7 3.5 - 5.1 mmol/L  ? Chloride 98 98 - 111 mmol/L  ? BUN 25 (H) 8 - 23 mg/dL  ? Creatinine, Ser 1.10 0.61 - 1.24 mg/dL  ? Glucose, Bld 95 70 - 99 mg/dL  ? Calcium, Ion 1.21 1.15 - 1.40 mmol/L  ? TCO2 27 22 - 32 mmol/L  ? Hemoglobin 10.5 (L) 13.0 - 17.0 g/dL  ? HCT 31.0 (L) 39.0 - 52.0 %  ?Culture, blood (Routine X 2) w Reflex to ID Panel     Status: None (Preliminary result)  ? Collection Time: 04/12/22  5:09 PM  ? Specimen: BLOOD  ?Result Value Ref Range  ? Specimen Description    ?  BLOOD BLOOD RIGHT FOREARM ?Performed at [facility], [place] [address], [place], [place] [address] ?  ? Special Requests    ?  BOTTLES DRAWN AEROBIC AND ANAEROBIC Blood Culture adequate volume ?Performed at [facility], [place] [address], [place], [place] [address] ?  ? Culture    ?  NO GROWTH < 12 HOURS ?Performed at [facility], [place] [address], [place], [place] [address] ?  ? Report Status PENDING   ?Urinalysis, Rou

## 2022-04-13 NOTE — Hospital Course (Addendum)
88 y.o. male with medical history significant of Prostate Cancer, atrial fibrillation, TIAs, sick sinus syndrome status post pacemaker placement, chronic bradycardia and GERD, essential hypertension who resides at [facility].  Patient was found unresponsive in the hall earlier this morning.  His son was called and informed.  He has been in and out of consciousness.  Suspected to have had syncopal episode versus seizure versus polypharmacy.  Brought to the ER where at this point patient is awake communicating but seems to be drowsy.  He could not explain what happened.  Suspected to have had a syncopal episode and patient being admitted to the hospital for evaluation and treatment.  He is unable to say what happened.  Did not remember hitting his head.  He is however off from his baseline ? ?4/16 MRI unsure if can do given PM and stimulator in place, son will bring in stimulator remote to turn off and cardiology team to verify PM compatibility during weekday ?

## 2022-04-13 NOTE — Assessment & Plan Note (Addendum)
TIA/CVA, no residual weakness ?Get Echo ?Get Orthostatic measures ?PT evaluation ?MRI brain ordered but need to verify PM compatibility and stimulator needs to be turned off. ?Unsure seizure no post ictal activity, incontinence reported HOWEVER, family reported "staring spell" ?Hypoglycemia? No notes of recorded BG in medic report ?No PE per CT A Chest ?Continue telemetry, obtain Get Echo ?Get Orthostatic measures ?PT evaluation ?Consider neurology evaluation. ?EEG pending ? ?

## 2022-04-13 NOTE — Progress Notes (Signed)
Enzalutamide Gillermina Phy) Hold Criteria: ?Seizures ?Hgb < 8 ?WBC < 2000 ?ANC < 1000 ?Pltc < 50K ? ?Patient admit with altered mental status, questionable seizures- EEG pending.  Hold Xtandi until seizures ruled out.  ?F/U with oncology to resume once appropriate.  ? ? ?Netta Cedars, PharmD, BCPS ?04/13/2022'@12'$ :19 AM ? ? ?

## 2022-04-13 NOTE — Assessment & Plan Note (Signed)
POA, wound care in place ?

## 2022-04-13 NOTE — Plan of Care (Signed)

## 2022-04-14 ENCOUNTER — Other Ambulatory Visit (HOSPITAL_COMMUNITY): Payer: Self-pay

## 2022-04-14 ENCOUNTER — Inpatient Hospital Stay (HOSPITAL_COMMUNITY): Payer: Medicare PPO

## 2022-04-14 ENCOUNTER — Encounter: Payer: Self-pay | Admitting: Hematology

## 2022-04-14 DIAGNOSIS — R55 Syncope and collapse: Secondary | ICD-10-CM

## 2022-04-14 LAB — GLUCOSE, CAPILLARY: Glucose-Capillary: 89 mg/dL (ref 70–99)

## 2022-04-14 NOTE — Evaluation (Signed)
Physical Therapy Evaluation ?Patient Details ?Name: Jacob Warner ?MRN: [id number] ?DOB: 11/19/1924 ?Today's Date: 04/14/2022 ? ?History of Present Illness ? Jacob Warner is a 88 y.o. male admitted 04/12/22 from [place] with syncope, collapse,  and AMS. PMH includes Prostate Cancer, atrial fibrillation, TIAs, sick sinus syndrome status post pacemaker placement, chronic bradycardia and GERD, essential hypertension.  ?Clinical Impression ? Pt admitted with above diagnosis.  ?Pt is from [place], during PT eval pt cannot recall if uses a walker or cane.  Pt is pleasant, unsure of baseline cognition, presents with  deficits in orientation , memory and  safety awareness/problem solving. 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 ?If cognition improves feel pt will be abel to return to ILF with HHPT.  ? Pt currently with functional limitations due to the deficits listed below (see PT Problem List). Pt will benefit from skilled PT to increase their independence and safety with mobility to allow discharge to the venue listed below.   ?   ?   ? ?Recommendations for follow up therapy are one component of a multi-disciplinary discharge planning process, led by the attending physician.  Recommendations may be updated based on patient status, additional functional criteria and insurance authorization. ? ?Follow Up Recommendations Home health PT ? ?  ?Assistance Recommended at Discharge Frequent or constant Supervision/Assistance (less if cognition clears)  ?Patient can return home with the following ? Assist for transportation;Help with stairs or ramp for entrance;Direct supervision/assist for medications management ? ?  ?Equipment Recommendations Other (comment) (TBD)  ?Recommendations for Other Services ?    ?  ?Functional Status Assessment Patient has had a recent decline in their functional status and demonstrates the ability to make significant improvements in  function in a reasonable and predictable amount of time.  ? ?  ?Precautions / Restrictions Precautions ?Precautions: Fall ?Restrictions ?Weight Bearing Restrictions: No  ? ?  ? ?Mobility ? Bed Mobility ?  ?  ?  ?  ?  ?  ?  ?General bed mobility comments: OOB in recliner ?  ? ?Transfers ?Overall transfer level: Needs assistance ?Equipment used: Rolling walker (2 wheels) ?Transfers: Sit to/from Stand ?  ?  ?  ?  ?  ?  ?General transfer comment: verbal cues for hand placement, min assist to rise, steady  and transition to RW ?  ? ?Ambulation/Gait ?Ambulation/Gait assistance: Min assist ?Gait Distance (Feet):  (hallway distance) ?Assistive device: Rolling walker (2 wheels) ?Gait Pattern/deviations: Step-through pattern, Drifts right/left ?  ?  ?  ?General Gait Details: assist to balance, maneuver RW and avoid drifting to R. ? ?Stairs ?  ?  ?  ?  ?  ? ?Wheelchair Mobility ?  ? ?Modified Rankin (Stroke Patients Only) ?  ? ?  ? ?Balance Overall balance assessment: Needs assistance ?Sitting-balance support: No upper extremity supported, Feet supported ?Sitting balance-Leahy Scale: Fair ?  ?  ?Standing balance support: Bilateral upper extremity supported, During functional activity, Reliant on assistive device for balance ?Standing balance-Leahy Scale: Poor ?Standing balance comment: LOB/requires assist  without UE support for static stand ?  ?  ?  ?  ?  ?  ?  ?  ?  ?  ?  ?   ? ? ? ?Pertinent Vitals/Pain Pain Assessment ?Pain Assessment: No/denies pain  ? ? ?Home Living Family/patient expects to be discharged to:: Unsure ?Living Arrangements: Alone ?  ?  ?  ?  ?  ?  ?  ?  ?  Additional Comments: pt is a resident of [facility]  ?  ?Prior Function Prior Level of Function : History of Falls (last six months);Patient poor historian/Family not available ?  ?  ?  ?  ?  ?  ?Mobility Comments: uses standard walker typically ?ADLs Comments: does own ADL: bathing/dressing, but facility does IADL ?   ? ? ?Hand Dominance  ? Dominant Hand: Right ? ?  ?Extremity/Trunk Assessment  ? Upper Extremity Assessment ?Upper Extremity Assessment: Defer to OT evaluation ?  ? ?Lower Extremity Assessment ?Lower Extremity Assessment: Overall WFL for tasks assessed ?  ? ?   ?Communication  ? Communication: No difficulties  ?Cognition Arousal/Alertness: Awake/alert ?Behavior During Therapy: [facility] for tasks assessed/performed ?Overall Cognitive Status: Impaired/Different from baseline ?Area of Impairment: Orientation, Memory, Attention, Safety/judgement ?  ?  ?  ?  ?  ?  ?  ?  ?Orientation Level: Disoriented to, Situation, Time ?Current Attention Level: Selective ?  ?  ?Safety/Judgement: Decreased awareness of safety ?  ?Problem Solving: Requires verbal cues, Slow processing, Difficulty sequencing ?  ?  ?  ? ?  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 ?  ?Exercises    ? ?Assessment/Plan  ?  ?PT Assessment Patient needs continued PT services  ?PT Problem List Decreased balance;Decreased knowledge of use of DME;Decreased mobility;Decreased cognition ? ?   ?  ?PT Treatment Interventions DME instruction;Therapeutic exercise;Gait training;Functional mobility training;Therapeutic activities;Patient/family education;Balance training   ? ?PT Goals (Current goals can be found in the Care Plan section)  ?Acute Rehab PT Goals ?Patient Stated Goal: pt does not state ?PT Goal Formulation: Patient unable to participate in goal setting ?Time For Goal Achievement: 04/28/22 ?Potential to Achieve Goals: Good ? ?  ?Frequency Min 3X/week ?  ? ? ?Co-evaluation   ?  ?  ?  ?  ? ? ?  ?AM-PAC PT "6 Clicks" Mobility  ?Outcome Measure Help needed turning from your back to your side while in a flat bed without using bedrails?: A Little ?Help needed moving from lying on your back to sitting on the side of a flat bed without using bedrails?: A Little ?Help needed moving to and from a bed to a  chair (including a wheelchair)?: A Little ?Help needed standing up from a chair using your arms (e.g., wheelchair or bedside chair)?: A Little ?Help needed to walk in hospital room?: A Little ?Help needed climbing 3-5 steps with a railing? : A Little ?6 Click Score: 18 ? ?  ?End of Session Equipment Utilized During Treatment: Gait belt ?Activity Tolerance: Patient tolerated treatment well ?Patient left: in chair;with call bell/phone within reach;with chair alarm set ?  ?PT Visit Diagnosis: Other abnormalities of gait and mobility (R26.89);Difficulty in walking, not elsewhere classified (R26.2) ?  ? ?Time: 1856-3149 ?PT Time Calculation (min) (ACUTE ONLY): 14 min ? ? ?Charges:   PT Evaluation ?$PT Eval Low Complexity: 1 Low ?  ?  ?   ? ? ?Baxter Flattery, PT ? ?Acute Rehab Dept [facility]) [phone] ?Pager [phone] ? ?04/14/2022 ? ? ?Zurich Carreno ?04/14/2022, 12:16 PM ? ?

## 2022-04-14 NOTE — Progress Notes (Addendum)
? ?PROGRESS NOTE ? ?Jacob Warner YOV:[id number] DOB: 1924/02/23 DOA: 04/12/2022 ?PCP: [name], MD ? ?HPI/Recap of past 24 hours: ? 88 y.o. male with medical history significant of Prostate Cancer, atrial fibrillation, TIAs, sick sinus syndrome status post pacemaker placement, chronic bradycardia and GERD, essential hypertension who resides at [facility].  Patient was found unresponsive in the [facility] earlier this morning.  His son was called and informed.  He has been in and out of consciousness.  Suspected to have had syncopal episode versus seizure versus polypharmacy.  Brought to the ER where at this point patient is awake communicating but seems to be drowsy.  He could not explain what happened.  Suspected to have had a syncopal episode and patient being admitted to the hospital for evaluation and treatment.  He is unable to say what happened.  Did not remember hitting his head.  He is however off from his baseline ?  ?4/17 Unable to obtain MRI due to stimulator not compatible with MRI.  Seen and examined at his bedside he has no new complaints. ? ?Assessment/Plan: ?Principal Problem: ?  Syncope and collapse ?Active Problems: ?  Bradycardia ?  Pacemaker ?  Chronic pain syndrome ?  PAF (paroxysmal atrial fibrillation) ([place]) ?  Chronic diastolic heart failure ([place]) ?  Pressure injury of skin ?  Weakness ? ?Syncope and collapse ?TIA/CVA, no residual weakness ?Get Echo ?Get Orthostatic measures ?PT evaluation ?MRI brain ordered but need to verify PM compatibility and stimulator needs to be turned off.  Stimulator not compatible with MRI. ?Unsure seizure no post ictal activity, incontinence reported HOWEVER, family reported "staring spell" ?Hypoglycemia? No notes of recorded BG in medic report ?No PE per CT A Chest ?Continue telemetry, obtain Get Echo ?Get Orthostatic measures ?PT evaluation ?Consider neurology evaluation. ?   ?Pressure injury of skin ?POA, wound care in place ?  ?Bradycardia ?Sinus  bradycardia, with PMon board. ?Lowest is 39 per telemetry, patient asymptomatic  ?  ?#2 essential hypertension: No ongoing treatment except for Lasix.  We will continue with it. ?  ?#3 metastatic prostate cancer: Continue treatment per oncology ?  ?#4 atrial fibrillation: Rate is controlled and bradycardic event.  No anticoagulation ?  ?#5 altered mental status: Appears to be acute delirium due to acute illness.  Patient may benefit from EEG to rule out a seizure if this returns or persists. ? ?Chronic microcytic anemia ?Hemoglobin 10.3 MCV 100.6. ?No overt bleeding ? ?Thrombocytopenia ?Platelet count 124 ?Continue to trend ? ?Chronic dCHF ?Euvolemic ?Strict I&O and daily weight ? ?Stage 1 pressure injury, sacral ?Local wound care ?Frequent turning ?  ?Code Status: Full code ? ?Family Communication: None at bedside ? ?Disposition Plan: Likely will discharge back to previous involvement. ? ? ?Consultants: ?None. ? ?Procedures: ?None ? ?Antimicrobials: ?None ? ?DVT prophylaxis: ?Subcu Lovenox daily ? ?Status is: Inpatient ?Patient requires at least 2 midnights for further evaluation and treatment of present condition. ? ? ? ?Objective: ?Vitals:  ? 04/13/22 2116 04/14/22 0500 04/14/22 0527 04/14/22 1444  ?BP: (!) 159/46  (!) 153/64 (!) 136/49  ?Pulse: (!) 51  (!) 50 (!) 50  ?Resp: '16  16 18  '$ ?Temp: 98 ?F (36.7 ?C)  97.7 ?F (36.5 ?C) (!) 97.4 ?F (36.3 ?C)  ?TempSrc: Oral  Oral Oral  ?SpO2: 100%  99% 97%  ?Weight:  79 kg    ?Height:      ? ? ?Intake/Output Summary (Last 24 hours) at 04/14/2022 1450 ?Last data filed at 04/14/2022  0945 ?Gross per 24 hour  ?Intake 1370.62 ml  ?Output 2300 ml  ?Net -929.38 ml  ? ?Filed Weights  ? 04/13/22 0400 04/14/22 0500  ?Weight: 79 kg 79 kg  ? ? ?Exam: ? ?General: 88 y.o. year-old male well developed well nourished in no acute distress.  Alert and interactive. ?Cardiovascular: Regular rate and rhythm with no rubs or gallops.  No thyromegaly or JVD noted.   ?Respiratory: Clear to  auscultation with no wheezes or rales. Good inspiratory effort. ?Abdomen: Soft nontender nondistended with normal bowel sounds x4 quadrants. ?Musculoskeletal: No lower extremity edema. 2/4 pulses in all 4 extremities. ?Skin: No ulcerative lesions noted or rashes, ?Psychiatry: Mood is appropriate for condition and setting ? ? ?Data Reviewed: ?CBC: ?Recent Labs  ?Lab 04/12/22 ?1635 04/12/22 ?1648 04/13/22 ?0351  ?WBC 4.9  --  4.2  ?NEUTROABS 3.7  --   --   ?HGB 10.4* 10.5* 10.3*  ?HCT 30.9* 31.0* 31.4*  ?MCV 100.7*  --  100.6*  ?PLT 145*  --  124*  ? ?Basic Metabolic Panel: ?Recent Labs  ?Lab 04/12/22 ?1635 04/12/22 ?1648 04/13/22 ?0351  ?NA 134* 135 136  ?K 4.6 4.7 4.2  ?CL 101 98 103  ?CO2 27  --  25  ?GLUCOSE 96 95 95  ?BUN 26* 25* 22  ?CREATININE 1.10 1.10 0.90  ?CALCIUM 9.3  --  9.1  ? ?GFR: ?Estimated Creatinine Clearance: 45.9 mL/min (by C-G formula based on SCr of 0.9 mg/dL). ?Liver Function Tests: ?Recent Labs  ?Lab 04/12/22 ?1635 04/13/22 ?0351  ?AST 14* 13*  ?ALT 9 7  ?ALKPHOS 68 68  ?BILITOT 0.6 1.1  ?PROT 6.6 6.3*  ?ALBUMIN 4.3 4.0  ? ?No results for input(s): LIPASE, AMYLASE in the last 168 hours. ?Recent Labs  ?Lab 04/12/22 ?1635  ?AMMONIA 12  ? ?Coagulation Profile: ?No results for input(s): INR, PROTIME in the last 168 hours. ?Cardiac Enzymes: ?Recent Labs  ?Lab 04/13/22 ?1419  ?CKTOTAL 77  ? ?BNP (last 3 results) ?No results for input(s): PROBNP in the last 8760 hours. ?HbA1C: ?No results for input(s): HGBA1C in the last 72 hours. ?CBG: ?Recent Labs  ?Lab 04/12/22 ?1647 04/13/22 ?0408 04/14/22 ?0518  ?GLUCAP 102* 93 89  ? ?Lipid Profile: ?No results for input(s): CHOL, HDL, LDLCALC, TRIG, CHOLHDL, LDLDIRECT in the last 72 hours. ?Thyroid Function Tests: ?No results for input(s): TSH, T4TOTAL, FREET4, T3FREE, THYROIDAB in the last 72 hours. ?Anemia Panel: ?No results for input(s): VITAMINB12, FOLATE, FERRITIN, TIBC, IRON, RETICCTPCT in the last 72 hours. ?Urine analysis: ?   ?Component Value Date/Time   ? COLORURINE YELLOW 04/12/2022 1713  ? APPEARANCEUR CLEAR 04/12/2022 1713  ? LABSPEC 1.016 04/12/2022 1713  ? PHURINE 7.0 04/12/2022 1713  ? GLUCOSEU NEGATIVE 04/12/2022 1713  ? HGBUR SMALL (A) 04/12/2022 1713  ? [place] NEGATIVE 04/12/2022 1713  ? [place] NEGATIVE 04/12/2022 1713  ? PROTEINUR NEGATIVE 04/12/2022 1713  ? NITRITE NEGATIVE 04/12/2022 1713  ? LEUKOCYTESUR NEGATIVE 04/12/2022 1713  ? ?Sepsis Labs: ?'@LABRCNTIP'$ (procalcitonin:4,lacticidven:4) ? ?) ?Recent Results (from the past 240 hour(s))  ?Culture, blood (Routine X 2) w Reflex to ID Panel     Status: None (Preliminary result)  ? Collection Time: 04/12/22  4:30 PM  ? Specimen: BLOOD  ?Result Value Ref Range Status  ? Specimen Description   Final  ?  BLOOD SITE NOT SPECIFIED ?Performed at [facility], [place] [address] [place], [place] [address] ?  ? Special Requests   Final  ?  BOTTLES DRAWN AEROBIC  AND ANAEROBIC Blood Culture results may not be optimal due to an excessive volume of blood received in culture bottles ?Performed at [facility], [place] [address] [place], [place] [address] ?  ? Culture   Final  ?  NO GROWTH 2 DAYS ?Performed at [facility], [place] [address] [place], [place] [address] ?  ? Report Status PENDING  Incomplete  ?Culture, blood (Routine X 2) w Reflex to ID Panel     Status: None (Preliminary result)  ? Collection Time: 04/12/22  5:09 PM  ? Specimen: BLOOD  ?Result Value Ref Range Status  ? Specimen Description   Final  ?  BLOOD BLOOD RIGHT FOREARM ?Performed at [facility], [place] [address], [place], [place] [address] ?  ? Special Requests   Final  ?  BOTTLES DRAWN AEROBIC AND ANAEROBIC Blood Culture adequate volume ?Performed at [facility], [place] [address] [place], [place] [address] ?  ? Culture   Final  ?  NO GROWTH 2 DAYS ?Performed at [facility], [place] [address], [place], [place] [address] ?  ? Report Status PENDING  Incomplete   ?Resp Panel by RT-PCR (Flu A&B, Covid) Nasopharyngeal Swab     Status: None  ? Collection Time: 04/12/22  9:08 PM  ? Specimen: Nasopharyngeal Swab; Nasopharyngeal(NP) swabs in vial transport medium  ?Result Value Ref R

## 2022-04-14 NOTE — Plan of Care (Signed)
  Problem: Education: Goal: Knowledge of General Education information will improve Description Including pain rating scale, medication(s)/side effects and non-pharmacologic comfort measures Outcome: Progressing   

## 2022-04-14 NOTE — Procedures (Incomplete)
Patient Name: Jacob Warner  ?MRN: [id number]  ?Epilepsy Attending: Lora Havens  ?Referring Physician/Provider: Regan Lemming, MD ?Date: 04/14/2022 ?Duration:  ? ?Patient history: 88yo M with syncope. EEG to evaluate for seizure ? ?Level of alertness: Awake, drowsy, sleep, comatose, lethargic *** ? ?AEDs during EEG study: GBP ? ?Technical aspects: This EEG study was done with scalp electrodes positioned according to the 10-20 International system of electrode placement. Electrical activity was acquired at a sampling rate of '500Hz'$  and reviewed with a high frequency filter of '70Hz'$  and a low frequency filter of '1Hz'$ . EEG data were recorded continuously and digitally stored.  ? ?Description: The posterior dominant rhythm consists of 9-10 Hz activity of moderate voltage (25-35 uV) seen predominantly in posterior head regions, symmetric and reactive to eye opening and eye closing. Drowsiness was characterized by attenuation of the posterior background rhythm. Sleep was characterized by vertex waves, sleep spindles (12 to 14 Hz), maximal frontocentral region.  There is an excessive amount of 15 to 18 Hz, 2-3 uV beta activity with irregular morphology distributed symmetrically and diffusely.  ? ?EEG showed continuous/intermittent generalized polymorphic sharply contoured 3 to 6 Hz theta-delta slowing. ? ?EEG showed generalized periodic discharges with triphasic morphology at  Hz, more prominent when awake/stimulated. ? ?Generalized Spike/Polyspikes/Sharp waves were noted in left/right frontal/temporal/parietal/occipital region.  ? ?Seizure was noted arising from left/right frontal/temporal/parietal/occipital region.  During seizure, patient was noted to.  Onset of seizure, total duration ? ?Event button was pressed on at for .  Concomitant EEG before, during and after the event showed normal posterior dominant rhythm, did not show any EEG changes suggest seizure. ? ?Hyperventilation did not show any EEG change.   Physiologic photic driving was not seen during photic stimulation.  Hyperventilation and photic stimulation were not performed.    ? ?ABNORMALITY ?-Sharp wave, generalized, left/right frontal/temporal/parietal/occipital region.  ?-Spike,generalized, left/right frontal/temporal/parietal/occipital region.  ?-Polyspikes, generalized, left/right frontal/temporal/parietal/occipital region.  ?- Lateralized periodic discharges with overriding fast activity ( LPD +) left/right, maximal frontal/temporal/parietal/occipital region ?- Periodic discharges with triphasic morphology, generalized ( GPDs) ?- Intermittent slow, generalized ?- Continuous slow, generalized ?- Excessive beta, generalized ? ? ? ?IMPRESSION: ?This study is within normal limits.  ?This study is suggestive of mild/moderate/severe diffuse encephalopathy, nonspecific etiology but likely related to sedation, toxic-metabolic etiology, anoxic/hypoxic brain injury ?This study is suggestive of cortical dysfunction arising from left/right frontal/temporal/parietal/occipital region, nonspecific etiology, likely secondary to underlying structural abnormality ?This study showed evidence of potential epileptogenicity arising from ?This study showed seizures arising from ?No seizures or epileptiform discharges were seen throughout the recording. ?However, only wakefulness and drowsiness were recorded. If suspicion for interictal activity remains a concern, a prolonged study including sleep should be considered.  ?The excessive beta activity seen in the background is most likely due to the effect of benzodiazepine and is a benign EEG pattern. ? ? ?Jacob Warner Jacob Warner  ? ?

## 2022-04-14 NOTE — Evaluation (Signed)
Occupational Therapy Evaluation ?Patient Details ?Name: Jacob Warner ?MRN: [id number] ?DOB: Nov 06, 1924 ?Today's Date: 04/14/2022 ? ? ?History of Present Illness Jacob Warner is a 88 y.o. male admitted 04/12/22 from [place] with syncope, collapse,  and AMS. PMH includes Prostate Cancer, atrial fibrillation, TIAs, sick sinus syndrome status post pacemaker placement, chronic bradycardia and GERD, essential hypertension.  ? ?Clinical Impression ?  ?Pt is from [place] where he is independent in ADL, and uses a standard walker for mobility. CE performs IADL. Today Pt is pleasant, unsure of baseline cognition, today slight deficits in orientation (initially), safety awareness, problem solving. Balance is dependent on RW/furniture surfaces (leaned against sink during grooming) or OT. Pt is min A for LB ADL and set up/min guard for UB ADL at this time. Recommending continued skilled OT in the acute setting as well as afterwards at the [facility] level to maximize safety and independence in ADL and functional transfers.  ?   ? ?Recommendations for follow up therapy are one component of a multi-disciplinary discharge planning process, led by the attending physician.  Recommendations may be updated based on patient status, additional functional criteria and insurance authorization.  ? ?Follow Up Recommendations ? Home health OT  ?  ?Assistance Recommended at Discharge Intermittent Supervision/Assistance  ?Patient can return home with the following A little help with bathing/dressing/bathroom;A little help with walking and/or transfers ? ?  ?Functional Status Assessment ? Patient has had a recent decline in their functional status and demonstrates the ability to make significant improvements in function in a reasonable and predictable amount of time.  ?Equipment Recommendations ? None recommended by OT Anson Fret can provide what he needs)  ?  ?Recommendations for Other Services PT consult ? ? ?  ?Precautions  / Restrictions Precautions ?Precautions: Fall ?Restrictions ?Weight Bearing Restrictions: No  ? ?  ? ?Mobility Bed Mobility ?Overal bed mobility: Needs Assistance ?Bed Mobility: Supine to Sit ?  ?  ?Supine to sit: Min assist, HOB elevated ?  ?  ?General bed mobility comments: use of bed rails to assist ?  ? ?Transfers ?Overall transfer level: Needs assistance ?Equipment used: Rolling walker (2 wheels) ?Transfers: Sit to/from Stand ?Sit to Stand: Min assist ?  ?  ?  ?  ?  ?General transfer comment: OT had to hold down the RW and Pt pulled up on it "I think I am a little rusty from being in this bed so long" ?  ? ?  ?Balance Overall balance assessment: Needs assistance ?Sitting-balance support: No upper extremity supported, Feet supported ?Sitting balance-Leahy Scale: Fair ?  ?  ?Standing balance support: Bilateral upper extremity supported, During functional activity, Reliant on assistive device for balance ?Standing balance-Leahy Scale: Poor ?Standing balance comment: dependent on external sources RW/sink surface/OT ?  ?  ?  ?  ?  ?  ?  ?  ?  ?  ?  ?   ? ?ADL either performed or assessed with clinical judgement  ? ?ADL Overall ADL's : Needs assistance/impaired ?Eating/Feeding: Modified independent ?  ?Grooming: Wash/dry hands;Wash/dry face;Oral care;Min guard;Standing ?Grooming Details (indicate cue type and reason): leans heavily against sink ?Upper Body Bathing: Supervision/ safety;Sitting ?  ?Lower Body Bathing: Sitting/lateral leans;Min guard ?  ?Upper Body Dressing : Set up;Sitting ?  ?Lower Body Dressing: Minimal assistance;Sit to/from stand ?Lower Body Dressing Details (indicate cue type and reason): able to don R sock, needed assist with Left ?Toilet Transfer: Min guard;Rolling walker (2 wheels) ?Toilet Transfer Details (indicate cue  type and reason): pulls up on RW from low bed ?  ?Toileting - Clothing Manipulation Details (indicate cue type and reason): currently with male purewick ?Tub/ Shower Transfer:  Walk-in shower;Min guard;Ambulation;Grab bars;Standard Walker ?  ?Functional mobility during ADLs: Min guard;Rolling walker (2 wheels);Cueing for safety ?General ADL Comments: decreased balance  ? ? ? ?Vision Baseline Vision/History: 1 Wears glasses ?Ability to See in Adequate Light: 0 Adequate ?Patient Visual Report: No change from baseline ?   ?   ?Perception   ?  ?Praxis   ?  ? ?Pertinent Vitals/Pain Pain Assessment ?Pain Assessment: No/denies pain  ? ? ? ?Hand Dominance Right ?  ?Extremity/Trunk Assessment Upper Extremity Assessment ?Upper Extremity Assessment: Defer to OT evaluation ?  ?Lower Extremity Assessment ?Lower Extremity Assessment: Overall WFL for tasks assessed ?  ?  ?  ?Communication Communication ?Communication: No difficulties ?  ?Cognition Arousal/Alertness: Awake/alert ?Behavior During Therapy: [facility] for tasks assessed/performed, Flat affect ?Overall Cognitive Status: No family/caregiver present to determine baseline cognitive functioning ?Area of Impairment: Orientation, Following commands, Awareness, Problem solving ?  ?  ?  ?  ?  ?  ?  ?  ?Orientation Level: Disoriented to, Place, Situation (initially did not know where he was, thought he was in the hospital for "blood clots") ?  ?  ?Following Commands: Follows one step commands with increased time ?  ?Awareness: Emergent ?Problem Solving: Requires verbal cues, Slow processing, Difficulty sequencing ?General Comments: Pt very pleasant and cooperative throughout session ?  ?  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 ?  ?Exercises   ?  ?Shoulder Instructions    ? ? ?Home Living Family/patient expects to be discharged to:: Unsure ?Living Arrangements: Alone ?  ?  ?  ?  ?  ?  ?  ?  ?  ?  ?  ?  ?  ?  ?  ?Additional Comments: pt is a resident of [facility] ?  ? ?  ?Prior Functioning/Environment Prior Level of Function : History of Falls (last six months);Patient poor historian/Family not  available ?  ?  ?  ?  ?  ?  ?Mobility Comments: uses standard walker typically ?ADLs Comments: does own ADL: bathing/dressing, but facility does IADL ?  ? ?  ?  ?OT Problem List: Decreased activity tolerance;Impaired balance (sitting and/or standing);Decreased safety awareness;Decreased knowledge of use of DME or AE ?  ?   ?OT Treatment/Interventions: Self-care/ADL training;DME and/or AE instruction;Energy conservation;Therapeutic activities;Patient/family education;Balance training  ?  ?OT Goals(Current goals can be found in the care plan section) Acute Rehab OT Goals ?Patient Stated Goal: "To figure out this blood clot" ?OT Goal Formulation: With patient ?Time For Goal Achievement: 04/28/22 ?Potential to Achieve Goals: Good ?ADL Goals ?Pt Will Perform Grooming: with modified independence;standing ?Pt Will Perform Upper Body Bathing: with supervision;sitting ?Pt Will Perform Lower Body Bathing: with supervision;sit to/from stand ?Pt Will Perform Upper Body Dressing: with modified independence;sitting ?Pt Will Perform Lower Body Dressing: with supervision;sit to/from stand ?Pt Will Transfer to Toilet: with supervision;ambulating ?Pt Will Perform Toileting - Clothing Manipulation and hygiene: with modified independence;sit to/from stand  ?OT Frequency: Min 2X/week ?  ? ?Co-evaluation   ?  ?  ?  ?  ? ?  ?AM-PAC OT "6 Clicks" Daily Activity     ?Outcome Measure Help from another person eating meals?: None ?Help from another person taking care of personal grooming?: A Little ?Help from another person toileting, which includes  using toliet, bedpan, or urinal?: A Little ?Help from another person bathing (including washing, rinsing, drying)?: A Little ?Help from another person to put on and taking off regular upper body clothing?: None ?Help from another person to put on and taking off regular lower body clothing?: A Little ?6 Click Score: 20 ?  ?End of Session Equipment Utilized During Treatment: Gait belt;Rolling walker  (2 wheels) ?Nurse Communication: Mobility status;Precautions;Other (comment) (no batteries in chair alarm) ? ?Activity Tolerance: Patient tolerated treatment well ?Patient left: in chair;with call bell/phone w

## 2022-04-15 DIAGNOSIS — R55 Syncope and collapse: Secondary | ICD-10-CM | POA: Diagnosis not present

## 2022-04-15 LAB — GLUCOSE, CAPILLARY: Glucose-Capillary: 92 mg/dL (ref 70–99)

## 2022-04-15 NOTE — Discharge Summary (Signed)
?Discharge Summary ? ?[name]:[id number] DOB: 06-16-1924 ? ?PCP: [name], MD ? ?Admit date: 04/12/2022 ?Discharge date: 04/15/2022 ? ?Time spent: 35 minutes. ? ?Recommendations for Outpatient Follow-up:  ?Follow-up with cardiology within a week. ?Follow-up with with your primary care provider in 1 to 2 weeks. ?Take your medications as prescribed ?Continue PT OT with assistance and fall precautions. ? ?Discharge Diagnoses:  ?Active Hospital Problems  ? Diagnosis Date Noted  ? Syncope and collapse 04/12/2022  ? Pressure injury of skin 04/13/2022  ? Weakness 04/13/2022  ? Chronic diastolic heart failure ([place]) 08/03/2019  ? PAF (paroxysmal atrial fibrillation) ([place]) 11/02/2018  ? Chronic pain syndrome 03/19/2017  ? Bradycardia 05/31/2015  ? Pacemaker 05/31/2015  ?  ?Resolved Hospital Problems  ?No resolved problems to display.  ? ? ?Discharge Condition: Stable ? ?Diet recommendation: Resume previous diet. ? ?Vitals:  ? 04/15/22 0556 04/15/22 1004  ?BP: (!) 145/53 (!) 151/56  ?Pulse: (!) 50 (!) 43  ?Resp: 20 19  ?Temp: 98.3 ?F (36.8 ?C) 98 ?F (36.7 ?C)  ?SpO2: 96% 98%  ? ? ?History of present illness:  ?Jacob Warner is a 88 y.o. male with medical history significant of Prostate Cancer, paroxysmal atrial fibrillation not on oral anticoagulation, TIAs, sick sinus syndrome status post pacemaker placement, chronic bradycardia, GERD, essential hypertension who resides at [facility].  Patient was found unresponsive in the [facility] earlier the morning of his admission.  His son was called and informed.  He was in and out of consciousness.  Suspected to have had syncopal episode versus polypharmacy.  Brought into the ER.  Work-up was unrevealing.  We were unable to obtain an MRI brain due to incompatibility with his spinal stimulator. ? ?CT head without contrast done on 04/12/2022 showed: ?1. No acute intracranial abnormalities. ?2. Chronic small vessel ischemic disease and parenchymal atrophy. ?3. Chronic  left maxillary sinus inflammation. ?4. Left mastoid air cell effusion. ? ?04/15/2022: Patient was seen and examined at his bedside.  There were no acute events overnight.  He is alert, pleasant, and interactive.  He has no new complaints and is eager to go home. ? ?Hospital Course:  ?Principal Problem: ?  Syncope and collapse ?Active Problems: ?  Bradycardia ?  Pacemaker ?  Chronic pain syndrome ?  PAF (paroxysmal atrial fibrillation) ([place]) ?  Chronic diastolic heart failure ([place]) ?  Pressure injury of skin ?  Weakness ? ?Syncope and collapse ?Unclear etiology. ?MRI brain ordered but spinal stimulator not compatible with MRI. ?PT OT evaluated and recommended home health PT OT. ?   ?Pressure injury of skin, sacrum ?POA, continue local wound care. ?  ?Chronic bradycardia ?Pacemaker in place ?Follow-up with cardiology outpatient ?  ?Essential hypertension ?Resume home regimen ?  ?Metastatic prostate cancer ?Resume home regimen ?  ?Paroxysmal atrial fibrillation ?Not on full anticoagulation prior to admission. ?Resume home regimen and follow-up with cardiology outpatient.   ? ?Resolved acute metabolic encephalopathy. ?Back to his baseline mentation. ?  ?Chronic microcytic anemia ?Hemoglobin 10.3 MCV 100.6. ?No overt bleeding ?Follow-up with your PCP outpatient ?  ?Thrombocytopenia ?Platelet count 124 ?Follow-up with your PCP outpatient ?  ?Chronic dCHF ?Euvolemic ?Follow-up with your cardiologist outpatient ?  ?Stage 1 pressure injury, sacral ?Local wound care ?Frequent turning ?  ?Code Status: Full code ?  ? ?  ?Consultants: ?None. ?  ?Procedures: ?None ?  ?Antimicrobials: ?None ?  ? ? ?Discharge Exam: ?BP (!) 151/56 (BP Location: Left Arm)   Pulse (!) 43  Temp 98 ?F (36.7 ?C) (Oral)   Resp 19   Ht '5\' 7"'$  (1.702 m)   Wt 80 kg   SpO2 98%   BMI 27.62 kg/m?  ?General: 88 y.o. year-old male well developed well nourished in no acute distress.  Alert, interactive, and pleasant. ?Cardiovascular: Regular rate and rhythm  with no rubs or gallops.  No thyromegaly or JVD noted.   ?Respiratory: Clear to auscultation with no wheezes or rales. Good inspiratory effort. ?Abdomen: Soft nontender nondistended with normal bowel sounds x4 quadrants. ?Musculoskeletal: Trace lower extremity edema.  ?Psychiatry: Mood is appropriate for condition and setting ? ?Discharge Instructions ?You were cared for by a hospitalist during your hospital stay. If you have any questions about your discharge medications or the care you received while you were in the hospital after you are discharged, you can call the unit and asked to speak with the hospitalist on call if the hospitalist that took care of you is not available. Once you are discharged, your primary care physician will handle any further medical issues. Please note that NO REFILLS for any discharge medications will be authorized once you are discharged, as it is imperative that you return to your primary care physician (or establish a relationship with a primary care physician if you do not have one) for your aftercare needs so that they can reassess your need for medications and monitor your lab values. ? ? ?Allergies as of 04/15/2022   ? ?   Reactions  ? Sulfamethoxazole-trimethoprim   ? Other reaction(s): rash  ? Sulfa Antibiotics Hives, Rash  ? Bactrim ?Other reaction(s): rash  ? Sulfacetamide Sodium Hives, Rash  ? Bactrim  ? ?  ? ?  ?Medication List  ?  ? ?STOP taking these medications   ? ?HYDROcodone-acetaminophen 5-325 MG tablet ?Commonly known as: NORCO/VICODIN ?  ?pantoprazole 40 MG tablet ?Commonly known as: PROTONIX ?  ? ?  ? ?TAKE these medications   ? ?acetaminophen 500 MG tablet ?Commonly known as: TYLENOL ?Take 500-1,000 mg by mouth See admin instructions. 500 mg twice daily, 1000 mg at bedtime ?  ?ARTIFICIAL TEARS OP ?Place 1 drop into both eyes daily as needed (for dry eyes). ?  ?aspirin EC 81 MG tablet ?Take 162 mg by mouth daily. Swallow whole. ?  ?dutasteride 0.5 MG  capsule ?Commonly known as: AVODART ?Take 0.5 mg by mouth daily. ?  ?furosemide 40 MG tablet ?Commonly known as: LASIX ?Take 40 mg by mouth daily. ?  ?gabapentin 300 MG capsule ?Commonly known as: NEURONTIN ?Take 1 capsule (300 mg total) by mouth at bedtime. ?  ?magnesium oxide 400 (240 Mg) MG tablet ?Commonly known as: MAG-OX ?Take 1 tablet by mouth 2 (two) times daily. ?  ?Restasis 0.05 % ophthalmic emulsion ?Generic drug: cycloSPORINE ?Place 1 drop into both eyes daily as needed (for dry eyes). ?  ?[place] 0.02-0.005 % Soln ?Generic drug: Netarsudil-Latanoprost ?Place 1 drop into both eyes at bedtime. ?  ?traZODone 50 MG tablet ?Commonly known as: DESYREL ?Take 50 mg by mouth at bedtime. ?  ?vitamin B-12 1000 MCG tablet ?Commonly known as: CYANOCOBALAMIN ?Take 1,000 mcg by mouth at bedtime. ?  ?Xtandi 80 MG tablet ?Generic drug: enzalutamide ?Take 2 tablets (160 mg total) by mouth daily. ?  ? ?  ? ?Allergies  ?Allergen Reactions  ? Sulfamethoxazole-Trimethoprim   ?  Other reaction(s): rash  ? Sulfa Antibiotics Hives and Rash  ?  Bactrim ?Other reaction(s): rash  ? Sulfacetamide Sodium Hives and Rash  ?  Bactrim  ? ? Follow-up Information   ? ? [name], MD. Call today.   ?Specialty: Family Medicine ?Why: Please call for a posthospital follow-up appointment. ?Contact information: ?[address] ?[address] ?[place] [address] ?[phone] ? ? ?  ?  ? ? [name], MD .   ?Specialty: Cardiology ?Contact information: ?[address] ?[address] ?[place] [address] ?[phone] ? ? ?  ?  ? ? [name], MD .   ?Specialty: Cardiology ?Contact information: ?[address] ?[address] ?[place] [address] ?[phone] ? ? ?  ?  ? ?  ?  ? ?  ? ? ? ?The results of significant diagnostics from this hospitalization (including imaging, microbiology, ancillary and laboratory) are listed below for reference.   ? ?Significant Diagnostic Studies: ?DG Chest 1 View ? ?Result Date:  04/12/2022 ?CLINICAL DATA:  Pt from independent living facility. Staff called his son this a.m. stating he was hard to rouse this morning and was found sitting on a couch. Staff stated pt continued to fall asleep while they were ta

## 2022-04-15 NOTE — TOC Progression Note (Signed)
Transition of Care (TOC) - Progression Note  ? ? ?Patient Details  ?Name: Jacob Warner ?MRN: [id number] ?Date of Birth: 12-05-1924 ? ?Transition of Care (TOC) CM/SW Contact  ?Purcell Mouton, RN ?Phone Number: ?04/15/2022, 10:42 AM ? ?Clinical Narrative:    ?Spoke with pt and his son [name] concerning [place]. [facility] was selected referral given to in house rep.  ? ? ?Expected Discharge Plan: [place] ?Barriers to Discharge: No Barriers Identified ? ?Expected Discharge Plan and Services ?Expected Discharge Plan: [place] ?  ?  ?Post Acute Care Choice: Home Health ?Living arrangements for the past 2 months: [place] ?Expected Discharge Date: 04/15/22               ?  ?  ?  ?  ?  ?HH Arranged: PT, OT ?[place] Agency: [facility] ?Date HH Agency Contacted: 04/15/22 ?Time [place]: [id number] ?Representative spoke with at [place]: Tommi Rumps ? ? ?Social Determinants of Health (SDOH) Interventions ?  ? ?Readmission Risk Interventions ?   ? View : No data to display.  ?  ?  ?  ? ? ?

## 2022-04-15 NOTE — Plan of Care (Signed)

## 2022-04-15 NOTE — Progress Notes (Signed)
AVS and discharge instructions reviewed w/ patient and son at the bedside. Patient and Son verbalized understanding and had no further questions. ?

## 2022-04-16 ENCOUNTER — Encounter: Payer: Self-pay | Admitting: Hematology

## 2022-04-17 LAB — CULTURE, BLOOD (ROUTINE X 2)
Culture: NO GROWTH
Culture: NO GROWTH
Special Requests: ADEQUATE

## 2022-04-22 ENCOUNTER — Telehealth: Payer: Self-pay | Admitting: Internal Medicine

## 2022-04-22 ENCOUNTER — Encounter: Payer: Self-pay | Admitting: Internal Medicine

## 2022-04-22 NOTE — Telephone Encounter (Signed)
New Message: ? ? ? ? ?Patient son said patient had an episode today where he fell, but did not passed out. Completely out.He called EMT and they came, they said patient need to go to the hospital. Patient refused to go to the hospital. The son wants to know if Dr Lovena Le thinks patient need to come in and be seen by somebody? ? ?Pt c/o Syncope: STAT if syncope occurred within 30 minutes and pt complains of lightheadedness ?High Priority if episode of passing out, completely, today or in last 24 hours  ? ?Did you pass out today? He did not actually pass out, but fell out ? ?When is the last time you passed out? Today- 2 weeks ago he passed out and was admitted to [facility]   ? ?Has this occurred multiple times? 2 times  - 1 time completely passed outr ? ?Did you have any symptoms prior to passing out? No symptoms  ?S ?

## 2022-04-22 NOTE — Telephone Encounter (Signed)
Returned call to son. ? ?Advised pacemaker functioning normally. ? ?Dr. Lovena Le reviewed EKG submitted via [place].  No issues with EKG. ? ?Advised to follow up with PCP if any further concerns.  Per son, they are establishing with a new PCP in a few weeks. ? ?Encouraged son to have PCP review medications to determine if any of his current medications could be contributing. ? ?No action needed. ?

## 2022-04-23 ENCOUNTER — Other Ambulatory Visit (HOSPITAL_COMMUNITY): Payer: Self-pay

## 2022-04-24 ENCOUNTER — Other Ambulatory Visit (HOSPITAL_COMMUNITY): Payer: Self-pay

## 2022-04-25 ENCOUNTER — Other Ambulatory Visit (HOSPITAL_COMMUNITY): Payer: Self-pay

## 2022-04-28 ENCOUNTER — Other Ambulatory Visit (HOSPITAL_COMMUNITY): Payer: Self-pay

## 2022-05-01 ENCOUNTER — Ambulatory Visit: Payer: Medicare PPO

## 2022-05-01 ENCOUNTER — Encounter: Payer: Self-pay | Admitting: Hematology

## 2022-05-01 ENCOUNTER — Other Ambulatory Visit: Payer: Medicare PPO

## 2022-05-02 ENCOUNTER — Telehealth: Payer: Self-pay

## 2022-05-02 NOTE — Telephone Encounter (Signed)
Contacted pt's son regarding his MyChart message. Pt's son stated his father was confused and falling. He wants to discuss goals of care with Dr Irene Limbo. Will set up phone appointment with pt's son and Dr Irene Limbo on Monday. Pt's son acknowledged.  ?

## 2022-05-05 ENCOUNTER — Inpatient Hospital Stay: Payer: Medicare PPO | Attending: Hematology | Admitting: Hematology

## 2022-05-05 ENCOUNTER — Other Ambulatory Visit (HOSPITAL_COMMUNITY): Payer: Self-pay

## 2022-05-05 DIAGNOSIS — C7951 Secondary malignant neoplasm of bone: Secondary | ICD-10-CM | POA: Diagnosis not present

## 2022-05-05 DIAGNOSIS — C61 Malignant neoplasm of prostate: Secondary | ICD-10-CM | POA: Insufficient documentation

## 2022-05-07 ENCOUNTER — Ambulatory Visit: Payer: Medicare PPO

## 2022-05-07 ENCOUNTER — Other Ambulatory Visit: Payer: Medicare PPO

## 2022-05-07 DIAGNOSIS — G4700 Insomnia, unspecified: Secondary | ICD-10-CM | POA: Diagnosis not present

## 2022-05-07 DIAGNOSIS — L89152 Pressure ulcer of sacral region, stage 2: Secondary | ICD-10-CM | POA: Diagnosis not present

## 2022-05-07 DIAGNOSIS — K219 Gastro-esophageal reflux disease without esophagitis: Secondary | ICD-10-CM | POA: Diagnosis not present

## 2022-05-07 DIAGNOSIS — G894 Chronic pain syndrome: Secondary | ICD-10-CM | POA: Diagnosis not present

## 2022-05-07 DIAGNOSIS — I509 Heart failure, unspecified: Secondary | ICD-10-CM | POA: Diagnosis not present

## 2022-05-08 ENCOUNTER — Ambulatory Visit: Payer: Medicare Other | Admitting: Orthopedic Surgery

## 2022-05-12 ENCOUNTER — Other Ambulatory Visit (HOSPITAL_COMMUNITY): Payer: Self-pay

## 2022-05-12 ENCOUNTER — Encounter: Payer: Self-pay | Admitting: Hematology

## 2022-05-12 ENCOUNTER — Encounter: Payer: Self-pay | Admitting: Internal Medicine

## 2022-05-12 NOTE — Progress Notes (Signed)
? ? ?HEMATOLOGY/ONCOLOGY PHONE VISIT NOTE ? ?Date of Service: .05/05/2022 ? ? ?Patient Care Team: ?[name], MD as PCP - General (Family Medicine) ?[name], MD as PCP - Cardiology (Cardiology) ?[name], MD as PCP - Electrophysiology (Cardiology) ?Brunetta Genera, MD as Consulting Physician (Hematology) ? ?CHIEF COMPLAINTS/PURPOSE OF CONSULTATION:  ?F/u For metastatic prostate cancer and goals of care discussion ? ?HISTORY OF PRESENTING ILLNESS:  ?Please see previous note for details on initial presentation ? ?INTERVAL HISTORY ? ?.I connected with Jacob Warner on .05/05/2022 at  3:50 PM EDT by telephone visit and verified that I am speaking with the correct person using two identifiers.  ? ?I discussed the limitations, risks, security and privacy concerns of performing an evaluation and management service by telemedicine and the availability of in-person appointments. I also discussed with the patient that there may be a patient responsible charge related to this service. The patient expressed understanding and agreed to proceed.  ? ?Other persons participating in the visit and their role in the encounter: Patients son Jacob Warner   ? ?Patient?s location: home   ?Provider?s location: [facility]  ? ?Chief Complaint: goals of care discussion and f/u for metastatic prostate cancer. ? ?-Called patient and primary discussed his current condition and goals of care with his son Jacob Warner who is his healthcare power of attorney and has been very involved with his care. ?Patient had a syncopal episode and was admitted to the hospital on 04/12/2022 and has since had significant issues with cognitive decline and loss of balance and difficulty sustaining himself.  There has been significant fatigue.  Patient has been unable to take care of himself and has required a higher level of care.  We discussed goals of care and several etiologies of altered mental status including delirium urinary tract  infections etc.  He did have a CT of the head without contrast on 04/12/2022 which showed no acute intracranial abnormalities. ?Given his increasing nursing needs and loss of independence and increasing symptom burden as well as significant confusion and falls he is needing near continuous medical cares.  Son has already consulted hospice and they are going to come and evaluate him soon.  I discussed that this would not be an unreasonable option especially if the patient and his son are not inclined to pursue more work-up and that tedious nature of continued follow-ups and management. ?Jacob Warner notes that the patient does not have any uncontrolled discomfort or pain at this time. ?He would like to hold off on any further oncologic management and would like to enroll his father in hospice which would be quite reasonable. ?We recommend that he call us if any additional input or help is needed from my clinic or if we are needed to specifically place an order for hospice evaluation and intake. ? ? ? ?MEDICAL HISTORY:  ?Past Medical History:  ?Diagnosis Date  ? A-fib ([place])   ? Alopecia   ? Anemia   ? Bradycardia   ? Cancer [facility])   ? Cherry angioma   ? Chronic pain syndrome 2018  ? Per new patient packet  ? Diastolic heart failure ([place]) 2020  ? Per new patient packet  ? Echocardiogram   ? Echo 11/19: Mild concentric LVH, EF 50-55, normal wall motion, grade 1 diastolic dysfunction, mild AI, MAC, mild MR, normal RVSF, moderate TR, PASP 36  ? GERD (gastroesophageal reflux disease)   ? Glaucoma   ? Hypertension   ? Nuclear  stress test   ? Nuclear stress test 11/19: not gated, inf and apical defect c/w small scar and/or soft tissue attenuation  ? PAF (paroxysmal atrial fibrillation) ([place]) 2019  ? Per new patient packet  ? Pelvic fracture ([facility])   ? Prostate cancer ([place])   ? Per new patient packet  ? Right BBB/left ant fasc block   ? Sick sinus syndrome ([place])   ? Syncope 2023  ? Kayleen Memos; Per new patient packet  ? TIA (transient  ischemic attack)   ? Xeroderma   ? ? ?SURGICAL HISTORY: ?Past Surgical History:  ?Procedure Laterality Date  ? APPENDECTOMY    ? CERVICAL SPINE SURGERY    ? DG PORTABLE CHEST X RAY ([place] [facility])  2023  ? Margarita Mail; Per new patient packet  ? EXPLORATORY LAPAROTOMY    ? LITHOTRIPSY    ? PACEMAKER PLACEMENT    ? PPM GENERATOR CHANGEOUT N/A 05/10/2020  ? Procedure: PPM GENERATOR CHANGEOUT;  Surgeon: [name], MD;  Location: [facility];  Service: Cardiovascular;  Laterality: N/A;  ? SPINAL CORD STIMULATOR IMPLANT Bilateral 2018  ? [name], MD PhD, [facility]  ? ? ?SOCIAL HISTORY: ?Social History  ? ?Socioeconomic History  ? Marital status: Married  ?  Spouse name: Not on file  ? Number of children: Not on file  ? Years of education: Not on file  ? Highest education level: Not on file  ?Occupational History  ? Not on file  ?Tobacco Use  ? Smoking status: Never  ? Smokeless tobacco: Never  ?Vaping Use  ? Vaping Use: Never used  ?Substance and Sexual Activity  ? Alcohol use: No  ?  Alcohol/week: 0.0 standard drinks  ? Drug use: No  ? Sexual activity: Not on file  ?Other Topics Concern  ? Not on file  ?Social History Narrative  ? Diet: Left blank  ?   ? Caffeine: Occasional soda or coffee  ?   ? Married, if yes what year: Widowed  ?   ? Do you live in a house, apartment, assisted living, condo, trailer, ect: [place]  ?   ? Is it one or more stories: 3 stories  ?   ? How many persons live in your home? 1  ?   ? Pets: N/A  ?   ? Highest level or education completed: [employer]  ?   ? Current/Past profession: Management consultant  ?   ? Exercise: Walk to meals          Type and how often:   ?   ?   ? Living Will: Yes  ? DNR: Left blank  ? POA/HPOA: Left blank  ?   ? Functional Status:  ? Do you have difficulty bathing or dressing yourself? Yes  ? Do you have difficulty preparing food or eating? No  ? Do you have difficulty managing your  medications? Yes  ? Do you have difficulty managing your finances? Yes  ? Do you have difficulty affording your medications? Yes  ? ?Social Determinants of Health  ? ?Financial Resource Strain: Not on file  ?Food Insecurity: Not on file  ?Transportation Needs: Not on file  ?Physical Activity: Not on file  ?Stress: Not on file  ?Social Connections: Not on file  ?Intimate Partner Violence: Not on file  ? ? ?FAMILY HISTORY: ?Family History  ?Problem Relation Age of Onset  ? Breast cancer Sister   ?  Cancer Brother   ? Breast cancer Brother   ? Other Son   ?     Heart Value Replacement (Congenital)  ? ? ?ALLERGIES:  is allergic to sulfamethoxazole-trimethoprim, sulfa antibiotics, and sulfacetamide sodium. ? ?MEDICATIONS:  ?Current Outpatient Medications  ?Medication Sig Dispense Refill  ? acetaminophen (TYLENOL) 500 MG tablet Take 500 mg by mouth in the morning and at bedtime.    ? Carboxymethylcell-Hypromellose (GENTEAL OP) Apply to eye at bedtime.    ? dutasteride (AVODART) 0.5 MG capsule Take 0.5 mg by mouth daily.    ? enzalutamide (XTANDI) 80 MG tablet Take 2 tablets (160 mg total) by mouth daily. 60 tablet 3  ? Ferrous Gluconate-C-Folic Acid (IRON-C PO) Take 1 tablet by mouth daily.    ? furosemide (LASIX) 40 MG tablet Take 40 mg by mouth daily.    ? gabapentin (NEURONTIN) 300 MG capsule Take 1 capsule (300 mg total) by mouth at bedtime. 90 capsule 1  ? HYDROcodone-acetaminophen (NORCO/VICODIN) 5-325 MG tablet Take 1 tablet by mouth as needed for moderate pain.    ? Magnesium Oxide 400 (240 Mg) MG TABS Take 1 tablet by mouth 2 (two) times daily.  7  ? RESTASIS 0.05 % ophthalmic emulsion Place 1 drop into both eyes daily as needed (for dry eyes).  11  ? ROCKLATAN 0.02-0.005 % SOLN Place 1 drop into both eyes at bedtime.    ? traZODone (DESYREL) 50 MG tablet Take 50 mg by mouth at bedtime.   11  ? vitamin B-12 (CYANOCOBALAMIN) 1000 MCG tablet Take 1,000 mcg by mouth at bedtime.    ? ?No current facility-administered  medications for this visit.  ? ? ? ?PHYSICAL EXAMINATION: ?Telemedicine visit ?LABORATORY DATA:  ?I have reviewed the data as listed ? ?. ? ?  Latest Ref Rng & Units 04/13/2022  ?  3:51 AM 04/12/2022  ?  4:48 P

## 2022-05-29 ENCOUNTER — Other Ambulatory Visit (HOSPITAL_COMMUNITY): Payer: Self-pay

## 2022-05-29 DEATH — deceased

## 2022-06-04 ENCOUNTER — Other Ambulatory Visit (HOSPITAL_COMMUNITY): Payer: Self-pay

## 2022-06-05 ENCOUNTER — Ambulatory Visit: Payer: Medicare PPO

## 2022-06-05 ENCOUNTER — Other Ambulatory Visit: Payer: Medicare PPO

## 2022-06-05 ENCOUNTER — Ambulatory Visit: Payer: Medicare PPO | Admitting: Hematology

## 2022-06-26 IMAGING — CT NM PET TUM IMG INITIAL (PI) SKULL BASE T - THIGH
7 series · 25 of 25 positions shown · non-contrast
Comparison: None.

CLINICAL DATA: Initial treatment strategy for follicular lymphoma.
Painful LEFT ilium.

EXAM:
NUCLEAR MEDICINE PET SKULL BASE TO THIGH
TECHNIQUE: 13.0 mCi F-18 FDG was injected intravenously. Full-ring PET imaging
was performed from the skull base to thigh after the radiotracer. CT
data was obtained and used for attenuation correction and anatomic
localization.
Fasting blood glucose: 92 mg/dl

[Series 3: pet sk_thigh ac · axial · 5.0mm · 4.07mm/px · z∈[-1008,-88]mm · 6 of 231 slices shown]
[im 1/231]
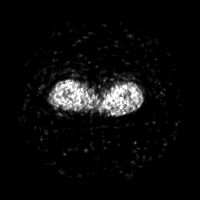
[im 47/231]
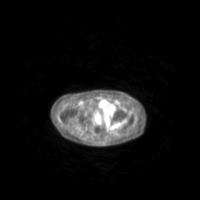
[im 93/231]
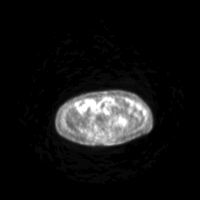
[im 139/231]
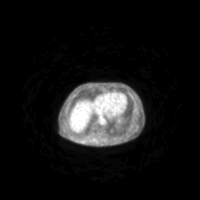
[im 185/231]
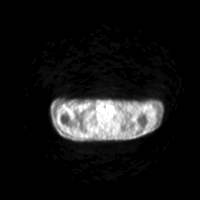
[im 231/231]
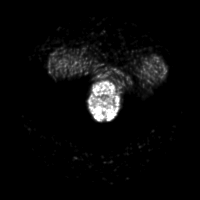

[Series 4: ct sk_thigh 5.0 bf37 · axial · 5.0mm · 0.98mm/px · z∈[-1008,-88]mm · 5 of 231 slices shown]
[im 1/231]
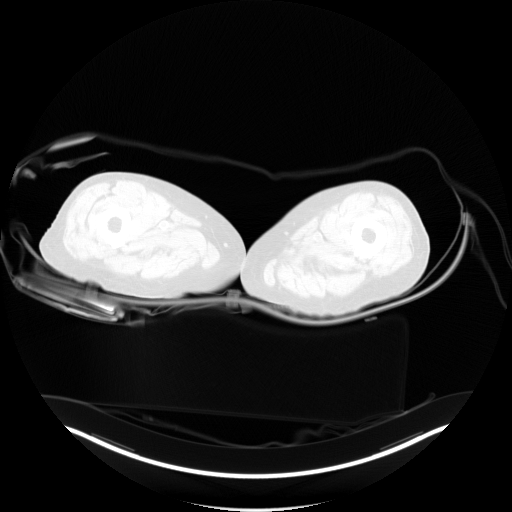
[im 58/231  brain]
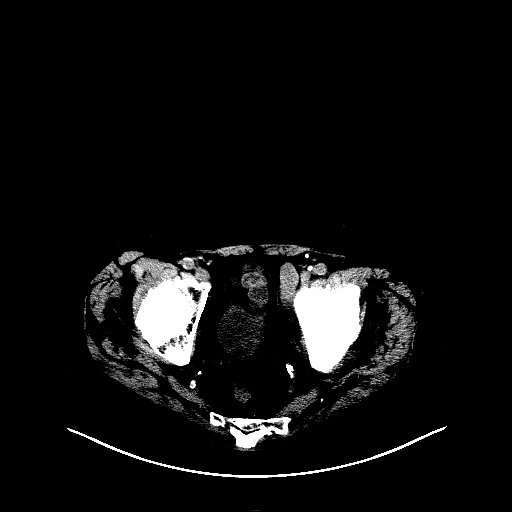
[im 116/231]
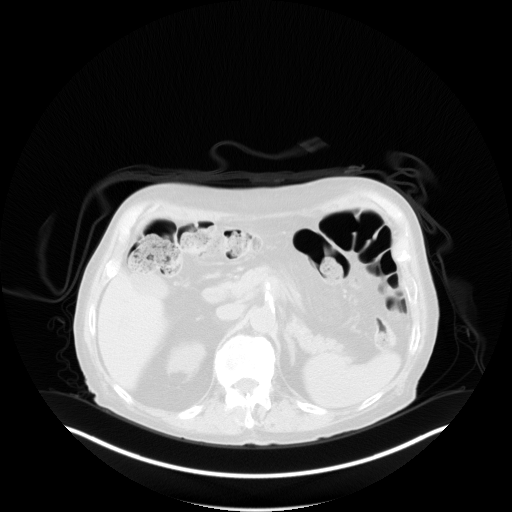
[im 173/231]
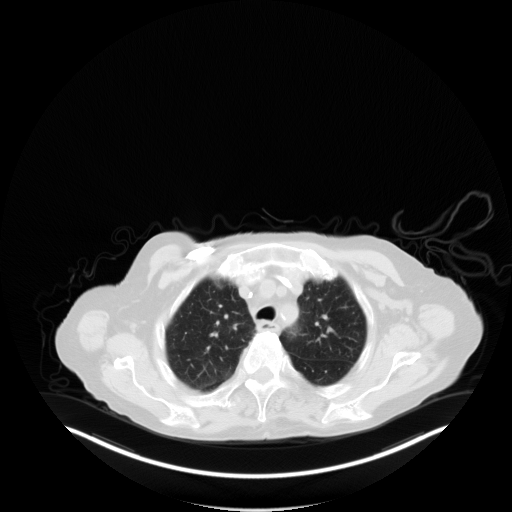
[im 231/231  brain]
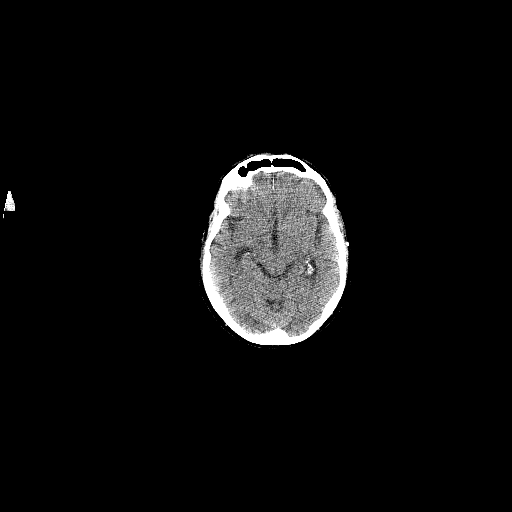

[Series 5: pet sk_thigh nac · axial · 5.0mm · 4.07mm/px · z∈[-1008,-88]mm · 5 of 231 slices shown]
[im 1/231]
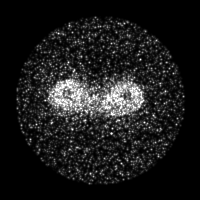
[im 58/231]
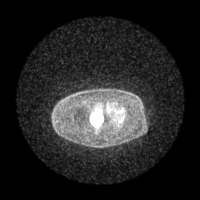
[im 116/231]
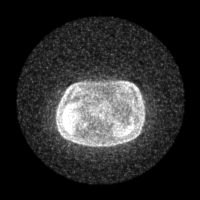
[im 173/231]
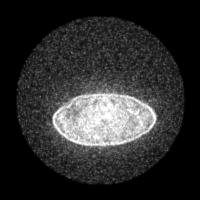
[im 231/231]
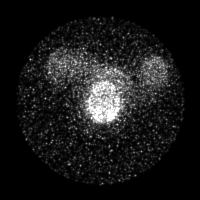

[Series 8: ct sk_thigh 5.0 br59 lung_bone · axial · 5.0mm · 0.74mm/px · z∈[-516,-248]mm · 2 of 68 slices shown]
[im 1/68]
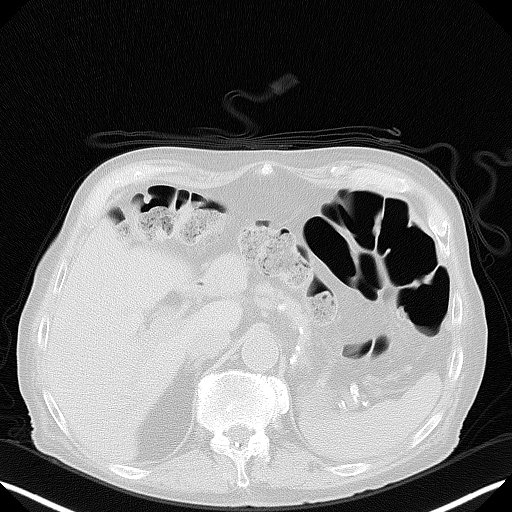
[im 68/68]
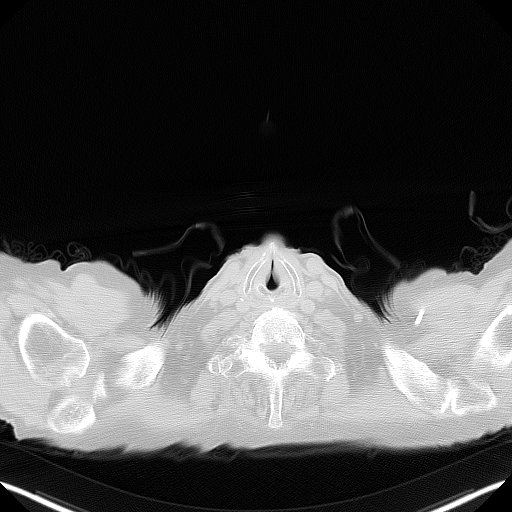

[Series 603: fused cor · 1 of 33 slices shown]
[im 1/33]
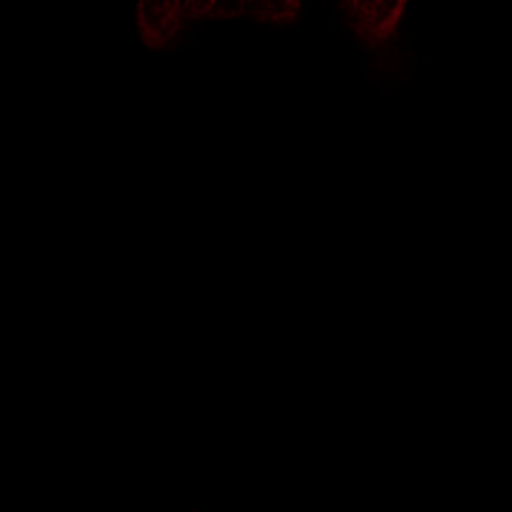

[Series 604: <mip collection> · coronal · 1.91mm/px · 1 of 32 slices shown]
[im 1/32]
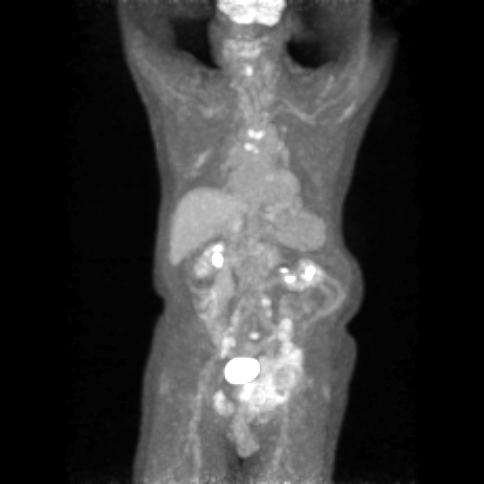

[Series 605: range-ct sk_thigh 5.0 bf37-tra-<alpha range> · 5 of 225 slices shown]
[im 1/225]
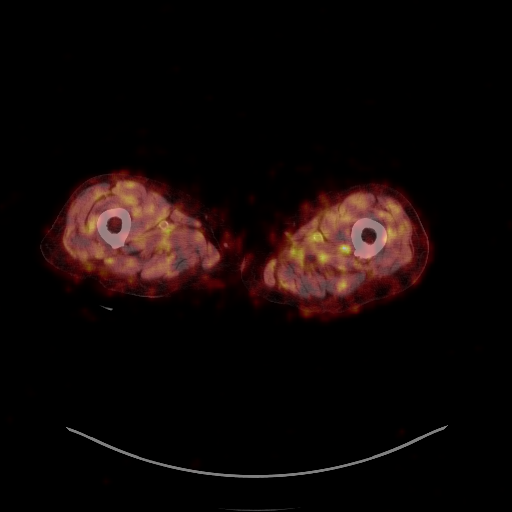
[im 57/225]
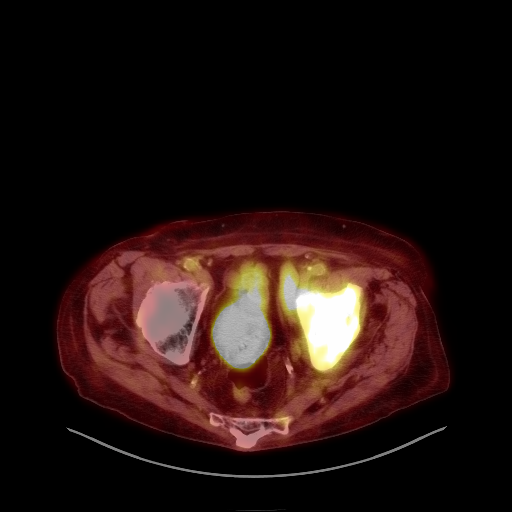
[im 113/225]
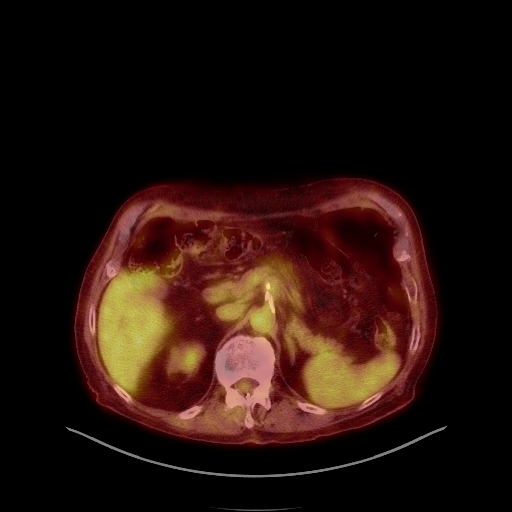
[im 169/225]
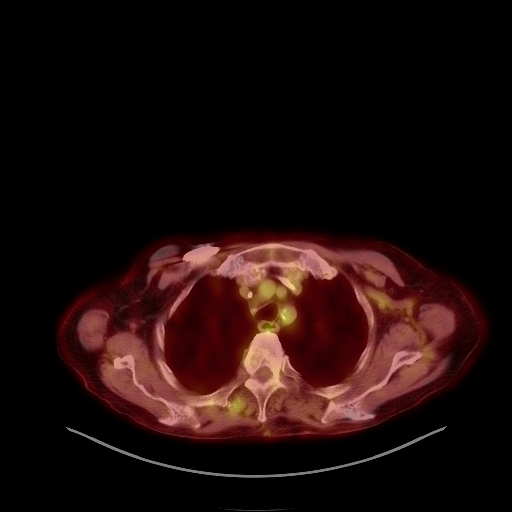
[im 225/225]
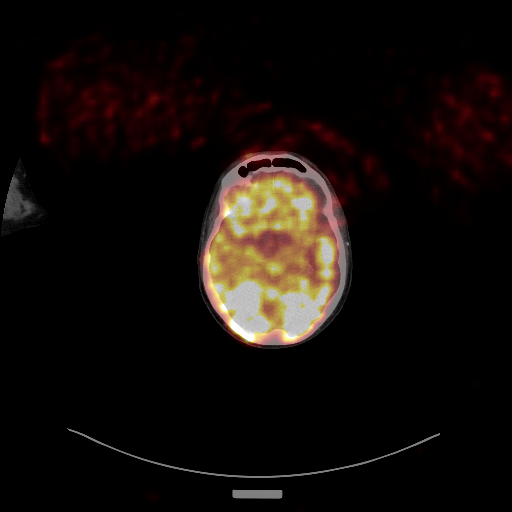

[25 of 25 positions shown; findings below may reference images not displayed]

FINDINGS: Mediastinal blood pool activity: SUV max

Liver activity: SUV max

NECK: No hypermetabolic lymph nodes in the neck.

Incidental CT findings: None

CHEST: No hypermetabolic mediastinal or hilar nodes.

4 mm nodule in the RIGHT lower lobe (image 79/4)

Incidental CT findings: Pacer in the RIGHT chest wall.

ABDOMEN/PELVIS: No abnormal hypermetabolic activity within the
liver, pancreas, adrenal glands, or spleen.

Some thickening of tissue along the superior mesenteric artery. No
significant hypermetabolic activity.

Hypermetabolic LEFT external iliac lymph node measures 15 mm short
axis with SUV max equal 6.1 (image 173).

SKELETON: Extensive involvement of the LEFT acetabulum with intense
metabolic activity (SUV max equal 6.0). There is extension into the
adjacent musculature of the operator fossa with similar intense
metabolic activity (image 184). There is associated sclerosis of the
LEFT acetabulum which extends into the inferior pubic ramus and
superior to the LEFT iliac bone. Posterior LEFT iliac bone with SUV
max equal 7.7.

Additional scattered metastatic lesions within LEFT and RIGHT
scapula. Intense metabolic activity associated with the C7 vertebral
body on the RIGHT with SUV max equal 9.3. Additional metastatic
lesions in the thoracic spine at T4-T5.

Incidental CT findings: Fracture of the RIGHT ischium at the pubic
symphysis (image [DATE].
IMPRESSION: 1. Dominant finding is extensive hypermetabolic lymphoma involvement
of the LEFT bony hemipelvis involving the acetabulum, ischium, and
medial iliac bone. The intense metabolic activity is accompanied by
extensive sclerosis and soft tissue extends to the musculature
adjacent to the acetabulum.
2. Additional metastatic lesions in the inferior RIGHT ischium,
lumbar spine, thoracic spine and cervical spine. Additional lesions
in the scapula.
3. Fracture of the medial RIGHT ischium adjacent to the pubic
symphysis without metabolic activity.
4. Metastatic iliac adenopathy. No evidence of metastatic adenopathy
outside the pelvis.
5. Normal volume spleen.

## 2022-07-03 ENCOUNTER — Ambulatory Visit: Payer: Medicare PPO

## 2022-07-03 ENCOUNTER — Other Ambulatory Visit: Payer: Medicare PPO

## 2022-07-24 ENCOUNTER — Ambulatory Visit: Payer: Medicare PPO

## 2022-07-31 ENCOUNTER — Ambulatory Visit: Payer: Medicare PPO

## 2022-07-31 ENCOUNTER — Other Ambulatory Visit: Payer: Medicare PPO

## 2022-08-28 ENCOUNTER — Ambulatory Visit: Payer: Medicare PPO

## 2022-08-28 ENCOUNTER — Other Ambulatory Visit: Payer: Medicare PPO

## 2022-10-16 ENCOUNTER — Ambulatory Visit: Payer: Medicare PPO

## 2022-10-23 ENCOUNTER — Ambulatory Visit: Payer: Medicare PPO

## 2023-01-15 ENCOUNTER — Ambulatory Visit: Payer: Medicare PPO

## 2023-02-26 NOTE — Telephone Encounter (Signed)
Error

## 2023-09-21 ENCOUNTER — Other Ambulatory Visit (HOSPITAL_BASED_OUTPATIENT_CLINIC_OR_DEPARTMENT_OTHER): Payer: Self-pay
